# Patient Record
Sex: Female | Born: 1950
Health system: Southern US, Community
[De-identification: ages and names within clinical notes are randomized; demographics above are authoritative.]

## PROBLEM LIST (undated history)

## (undated) DIAGNOSIS — C4490 Unspecified malignant neoplasm of skin, unspecified: Secondary | ICD-10-CM

## (undated) DIAGNOSIS — I2510 Atherosclerotic heart disease of native coronary artery without angina pectoris: Secondary | ICD-10-CM

## (undated) DIAGNOSIS — M419 Scoliosis, unspecified: Secondary | ICD-10-CM

## (undated) DIAGNOSIS — R112 Nausea with vomiting, unspecified: Secondary | ICD-10-CM

## (undated) DIAGNOSIS — K219 Gastro-esophageal reflux disease without esophagitis: Secondary | ICD-10-CM

## (undated) DIAGNOSIS — Z9889 Other specified postprocedural states: Secondary | ICD-10-CM

## (undated) DIAGNOSIS — K224 Dyskinesia of esophagus: Secondary | ICD-10-CM

## (undated) DIAGNOSIS — C50919 Malignant neoplasm of unspecified site of unspecified female breast: Secondary | ICD-10-CM

## (undated) DIAGNOSIS — I10 Essential (primary) hypertension: Secondary | ICD-10-CM

## (undated) DIAGNOSIS — I429 Cardiomyopathy, unspecified: Secondary | ICD-10-CM

## (undated) DIAGNOSIS — K259 Gastric ulcer, unspecified as acute or chronic, without hemorrhage or perforation: Secondary | ICD-10-CM

## (undated) DIAGNOSIS — I509 Heart failure, unspecified: Secondary | ICD-10-CM

## (undated) DIAGNOSIS — E785 Hyperlipidemia, unspecified: Secondary | ICD-10-CM

## (undated) DIAGNOSIS — C801 Malignant (primary) neoplasm, unspecified: Secondary | ICD-10-CM

## (undated) DIAGNOSIS — F419 Anxiety disorder, unspecified: Secondary | ICD-10-CM

## (undated) DIAGNOSIS — M1990 Unspecified osteoarthritis, unspecified site: Secondary | ICD-10-CM

## (undated) DIAGNOSIS — T4145XA Adverse effect of unspecified anesthetic, initial encounter: Secondary | ICD-10-CM

## (undated) DIAGNOSIS — K449 Diaphragmatic hernia without obstruction or gangrene: Secondary | ICD-10-CM

## (undated) DIAGNOSIS — R350 Frequency of micturition: Secondary | ICD-10-CM

## (undated) HISTORY — DX: Dyskinesia of esophagus: K22.4

## (undated) HISTORY — DX: Unspecified malignant neoplasm of skin, unspecified: C44.90

## (undated) HISTORY — PX: RHINOPLASTY: SUR1284

## (undated) HISTORY — DX: Hyperlipidemia, unspecified: E78.5

## (undated) HISTORY — DX: Atherosclerotic heart disease of native coronary artery without angina pectoris: I25.10

## (undated) HISTORY — PX: CERVICAL FUSION: SHX112

## (undated) HISTORY — DX: Gastric ulcer, unspecified as acute or chronic, without hemorrhage or perforation: K25.9

## (undated) HISTORY — DX: Diaphragmatic hernia without obstruction or gangrene: K44.9

## (undated) HISTORY — DX: Cardiomyopathy, unspecified: I42.9

## (undated) HISTORY — DX: Essential (primary) hypertension: I10

## (undated) HISTORY — DX: Malignant neoplasm of unspecified site of unspecified female breast: C50.919

## (undated) SURGERY — ESOPHAGOGASTRODUODENOSCOPY (EGD) WITH PROPOFOL
Anesthesia: Monitor Anesthesia Care

---
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
## 1992-06-19 HISTORY — PX: CHOLECYSTECTOMY: SHX55

## 1997-06-19 LAB — HM COLONOSCOPY: HM Colonoscopy: NORMAL

## 1998-06-19 HISTORY — PX: ABDOMINAL HYSTERECTOMY: SHX81

## 2002-11-10 ENCOUNTER — Other Ambulatory Visit: Admission: RE | Admit: 2002-11-10 | Discharge: 2002-11-10 | Payer: Self-pay | Admitting: Family Medicine

## 2003-06-20 HISTORY — PX: KNEE ARTHROSCOPY: SHX127

## 2004-12-16 ENCOUNTER — Emergency Department (HOSPITAL_COMMUNITY): Admission: EM | Admit: 2004-12-16 | Discharge: 2004-12-16 | Payer: Self-pay | Admitting: Emergency Medicine

## 2005-03-07 ENCOUNTER — Encounter: Admission: RE | Admit: 2005-03-07 | Discharge: 2005-03-07 | Payer: Self-pay | Admitting: Orthopedic Surgery

## 2008-03-20 ENCOUNTER — Ambulatory Visit (HOSPITAL_BASED_OUTPATIENT_CLINIC_OR_DEPARTMENT_OTHER): Admission: RE | Admit: 2008-03-20 | Discharge: 2008-03-20 | Payer: Self-pay | Admitting: Family Medicine

## 2008-06-19 LAB — HM DIABETES EYE EXAM: HM Diabetic Eye Exam: NORMAL

## 2008-06-19 LAB — HM MAMMOGRAPHY: HM Mammogram: NORMAL

## 2009-03-16 ENCOUNTER — Ambulatory Visit: Payer: Self-pay | Admitting: Diagnostic Radiology

## 2009-03-16 ENCOUNTER — Emergency Department (HOSPITAL_BASED_OUTPATIENT_CLINIC_OR_DEPARTMENT_OTHER): Admission: EM | Admit: 2009-03-16 | Discharge: 2009-03-16 | Payer: Self-pay | Admitting: Emergency Medicine

## 2010-01-17 HISTORY — PX: FOOT NEUROMA SURGERY: SHX646

## 2010-01-26 LAB — HM PAP SMEAR: HM Pap smear: NORMAL

## 2010-09-23 LAB — DIFFERENTIAL
Basophils Absolute: 0 10*3/uL (ref 0.0–0.1)
Basophils Relative: 1 % (ref 0–1)
Eosinophils Absolute: 0.4 10*3/uL (ref 0.0–0.7)
Eosinophils Relative: 6 % — ABNORMAL HIGH (ref 0–5)
Lymphocytes Relative: 26 % (ref 12–46)
Lymphs Abs: 1.9 10*3/uL (ref 0.7–4.0)
Monocytes Absolute: 0.5 10*3/uL (ref 0.1–1.0)
Monocytes Relative: 7 % (ref 3–12)
Neutro Abs: 4.3 10*3/uL (ref 1.7–7.7)
Neutrophils Relative %: 60 % (ref 43–77)

## 2010-09-23 LAB — URINALYSIS, ROUTINE W REFLEX MICROSCOPIC
Bilirubin Urine: NEGATIVE
Glucose, UA: NEGATIVE mg/dL
Hgb urine dipstick: NEGATIVE
Ketones, ur: NEGATIVE mg/dL
Nitrite: NEGATIVE
Protein, ur: NEGATIVE mg/dL
Specific Gravity, Urine: 1.013 (ref 1.005–1.030)
Urobilinogen, UA: 0.2 mg/dL (ref 0.0–1.0)
pH: 6 (ref 5.0–8.0)

## 2010-09-23 LAB — BASIC METABOLIC PANEL
BUN: 19 mg/dL (ref 6–23)
CO2: 28 mEq/L (ref 19–32)
Calcium: 9.9 mg/dL (ref 8.4–10.5)
Chloride: 98 mEq/L (ref 96–112)
Creatinine, Ser: 0.6 mg/dL (ref 0.4–1.2)
GFR calc Af Amer: >60 mL/min (ref >60)
GFR calc non Af Amer: >60 mL/min (ref >60)
Glucose, Bld: 129 mg/dL — ABNORMAL HIGH (ref 70–99)
Potassium: 3.5 mEq/L (ref 3.5–5.1)
Sodium: 134 mEq/L — ABNORMAL LOW (ref 135–145)

## 2010-09-23 LAB — CBC
HCT: 36.3 % (ref 36.0–46.0)
Hemoglobin: 13.1 g/dL (ref 12.0–15.0)
MCHC: 36 g/dL (ref 30.0–36.0)
MCV: 92.2 fL (ref 78.0–100.0)
Platelets: 199 10*3/uL (ref 150–400)
RBC: 3.94 MIL/uL (ref 3.87–5.11)
RDW: 11.8 % (ref 11.5–15.5)
WBC: 7.1 10*3/uL (ref 4.0–10.5)

## 2010-09-26 ENCOUNTER — Encounter: Payer: Self-pay | Admitting: Nurse Practitioner

## 2010-09-26 DIAGNOSIS — E049 Nontoxic goiter, unspecified: Secondary | ICD-10-CM | POA: Insufficient documentation

## 2010-09-26 DIAGNOSIS — N6009 Solitary cyst of unspecified breast: Secondary | ICD-10-CM

## 2010-09-26 DIAGNOSIS — E785 Hyperlipidemia, unspecified: Secondary | ICD-10-CM | POA: Insufficient documentation

## 2010-09-26 DIAGNOSIS — I10 Essential (primary) hypertension: Secondary | ICD-10-CM

## 2010-09-26 DIAGNOSIS — K219 Gastro-esophageal reflux disease without esophagitis: Secondary | ICD-10-CM

## 2010-09-26 DIAGNOSIS — F419 Anxiety disorder, unspecified: Secondary | ICD-10-CM | POA: Insufficient documentation

## 2010-09-26 DIAGNOSIS — J309 Allergic rhinitis, unspecified: Secondary | ICD-10-CM | POA: Insufficient documentation

## 2010-09-26 DIAGNOSIS — IMO0002 Reserved for concepts with insufficient information to code with codable children: Secondary | ICD-10-CM | POA: Insufficient documentation

## 2010-11-04 ENCOUNTER — Ambulatory Visit: Payer: Self-pay | Admitting: Family

## 2010-12-06 ENCOUNTER — Ambulatory Visit (INDEPENDENT_AMBULATORY_CARE_PROVIDER_SITE_OTHER): Payer: Managed Care, Other (non HMO) | Admitting: Family

## 2010-12-06 ENCOUNTER — Encounter: Payer: Self-pay | Admitting: Family

## 2010-12-06 DIAGNOSIS — F17200 Nicotine dependence, unspecified, uncomplicated: Secondary | ICD-10-CM

## 2010-12-06 DIAGNOSIS — Z720 Tobacco use: Secondary | ICD-10-CM | POA: Insufficient documentation

## 2010-12-06 DIAGNOSIS — F419 Anxiety disorder, unspecified: Secondary | ICD-10-CM

## 2010-12-06 DIAGNOSIS — E785 Hyperlipidemia, unspecified: Secondary | ICD-10-CM

## 2010-12-06 DIAGNOSIS — I10 Essential (primary) hypertension: Secondary | ICD-10-CM | POA: Insufficient documentation

## 2010-12-06 DIAGNOSIS — F411 Generalized anxiety disorder: Secondary | ICD-10-CM

## 2010-12-06 MED ORDER — OLMESARTAN MEDOXOMIL-HCTZ 20-12.5 MG PO TABS: 1.0000 | ORAL_TABLET | Freq: Every day | ORAL | Status: DC

## 2010-12-06 NOTE — Assessment & Plan Note (Signed)
Discussed a low fat, low cholesterol diet.

## 2010-12-06 NOTE — Assessment & Plan Note (Signed)
Reports that this is stable.

## 2010-12-06 NOTE — Progress Notes (Signed)
Subjective:    Patient ID: Sherry Bender, female    DOB: 1951-01-09, 60 y.o.   MRN: [id number]  HPI  [facility]-   Hyperlipidemia-  She reports that she eats lots of fruits/veggies.  Eats "a lot of red meat."   HTN-  Reports that she was diagnosed a year ago.  Tries to follow a low sodium diet.    Patient presents today for followup of hypertension.  Patient has been treated for Chronic HTN for quiet sometime. She is currently on HTCZ, and well controlled. No associated S/S related to HTN.   Quality: chronic Modifying factor: meds Duration: Quite sometime Associated S/S: None.  The patient denies the following associated symptoms: Chest pain, dyspnea, blurred vision.  Notes occasional HA's and slight LE edema R>L- feet become more swollen.  Tobacco abuse- has tried nicotine patch, afraid of chantix. Motivated to quit - has set goal to quit by august.       Review of Systems  Constitutional: Negative for fever.  HENT: Positive for rhinorrhea. Negative for hearing loss.   Eyes: Negative for visual disturbance.  Respiratory: Positive for cough.   Cardiovascular: Negative for chest pain.  Gastrointestinal: Negative for nausea, vomiting and diarrhea.  Genitourinary: Negative for dysuria, frequency and hematuria.  Neurological: Negative for weakness and numbness.  Psychiatric/Behavioral:       Occasional anxiety- once in a while.     Past Medical History  Diagnosis Date  . Hyperlipidemia   . Hypertension     History   Social History  . Marital Status: Married    Spouse Name: N/A    Number of Children: N/A  . Years of Education: N/A   Occupational History  . Not on file.   Social History Main Topics  . Smoking status: Current Everyday Smoker -- 0.5 packs/day for 42 years    Types: Cigarettes  . Smokeless tobacco: Not on file  . Alcohol Use: No  . Drug Use: No  . Sexually Active: Not on file   Other Topics Concern  . 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  Two daughters born 37 and 87- one in [place] one in [place].     Past Surgical History  Procedure Date  . Cholecystectomy 1994  . Abdominal hysterectomy 2000    TAH/BSO  . Knee arthroscopy 2005    right knee  . Foot neuroma surgery 01/2010    also had metatarasl shortened    Family History  Problem Relation Age of Onset  . Heart disease Mother   . Hypertension Mother   . Diabetes Mother   . Heart disease Father   . Hypertension Father   . Cancer Paternal Aunt     ? breast  . Cancer Maternal Grandmother     ? ovarian cancer    Allergies  Allergen Reactions  . Codeine Nausea And Vomiting  . Lisinopril     Neck pain, muscle pain.  . Norvasc (Amlodipine Besylate)     Muscle pain  . Toprol Xl (Metoprolol Succinate)     Muscle pain    Current Outpatient Prescriptions on File Prior to Visit  Medication Sig Dispense Refill  . esomeprazole (NEXIUM) 40 MG capsule Take 40 mg by mouth daily before breakfast.        . estradiol (ESTRACE) 1 MG  tablet Take 1 mg by mouth daily.        . hydrochlorothiazide 25 MG tablet Take 25 mg by mouth daily.          BP 150/80  Pulse 78  Temp(Src) 97.8 F (36.6 C) (Oral)  Resp 16  Ht 5\' 7"  (1.702 m)  Wt 155 lb 0.6 oz (70.326 kg)  BMI 24.28 kg/m2  LMP 02/28/1999        Objective:   Physical Exam  Constitutional: She is oriented to person, place, and time. She appears well-developed and well-nourished.  HENT:  Head: Normocephalic and atraumatic.  Right Ear: External ear normal.  Left Ear: External ear normal.  Eyes: Conjunctivae are normal. Pupils are equal, round, and reactive to light.  Neck: Normal range of motion. Neck supple. No tracheal deviation present. No thyromegaly present.  Cardiovascular: Normal rate and regular rhythm.   Pulmonary/Chest: Effort  normal and breath sounds normal.  Abdominal: Soft.  Musculoskeletal: Normal range of motion.  Neurological: She is alert and oriented to person, place, and time.  Psychiatric:       Became tearful when she spoke about her current unemployment          Assessment & Plan:  Benicar HCT lot [id number] exp 07/14  Will request old records from her most recent provider.

## 2010-12-06 NOTE — Patient Instructions (Signed)
Please work hard on quitting smoking.  Follow up in 1 month.

## 2010-12-06 NOTE — Assessment & Plan Note (Signed)
BP not at goal.  Will switch to benicar HCT, plan to have pt f/u in 1 months. Check BMET in 1 month at f/u.

## 2010-12-06 NOTE — Assessment & Plan Note (Signed)
Patient was counseled on smoking cessation for 5 minutes.

## 2010-12-12 ENCOUNTER — Telehealth: Payer: Self-pay | Admitting: Family

## 2010-12-12 NOTE — Telephone Encounter (Signed)
Pt states that since she has started taking benicar she has been experiencing extreme fatique and leg cramps.

## 2010-12-13 MED ORDER — CLONIDINE HCL 0.1 MG PO TABS: 0.1000 mg | ORAL_TABLET | Freq: Two times a day (BID) | ORAL | Status: DC

## 2010-12-13 NOTE — Telephone Encounter (Signed)
Spoke with pt, she reports that after taking Benicar HCT she experiences extreme fatigue. Doesn't feel like she can get up and move around for some time after taking it. Also has leg cramps. States that she was not having these symptoms before she started the medication. Pt is also asking that we send a Rx to pharmacy for a blood pressure monitor so her insurance will cover it. Please advise.

## 2010-12-13 NOTE — Telephone Encounter (Signed)
Spoke with patient, instructed her to stop benicar HCT, restart HCTZ, add clonidine bid and follow up in 1 month.

## 2010-12-13 NOTE — Telephone Encounter (Signed)
Left message on home phone to return my call.

## 2010-12-15 ENCOUNTER — Telehealth: Payer: Self-pay | Admitting: *Deleted

## 2010-12-15 NOTE — Telephone Encounter (Signed)
Received records from Dr Candie Echevaria, DO and forwarded to Provider for review.

## 2010-12-19 ENCOUNTER — Telehealth: Payer: Self-pay | Admitting: *Deleted

## 2010-12-19 MED ORDER — CLONIDINE HCL 0.1 MG PO TABS: ORAL_TABLET | ORAL | Status: DC

## 2010-12-19 NOTE — Telephone Encounter (Signed)
Patient states she has been taking the Clonidine for the past week, she filled the Rx on 12/13/2010. Patient states her symptoms started the same day that she started the medication. She states she has continued with the medication, so that it could get in her system. She states she now has headaches, tiredness,shoulder blade pain, and feels like she is drunk all the time. Patient was advised to discontinue the medication until further instruction from [place]. Patient has verbalized understanding and agrees as instructed; will wait for a call back.

## 2010-12-19 NOTE — Telephone Encounter (Signed)
Call placed to patient at [phone], patient informed of follow up per Sandford Craze instructions. She has scheduled for 01/13/2011 @ 3:15 pm

## 2010-12-19 NOTE — Telephone Encounter (Signed)
Called patient, she reports that the clonidine is making her feel drowsy and off balance.  I recommended that she cut the clonidine in half and take 1/2 tablet twice daily.  Darlene, could you pls call pt and schedule a follow up appointment for  7/27 with me. Thanks.

## 2010-12-22 ENCOUNTER — Telehealth: Payer: Self-pay | Admitting: Family

## 2010-12-22 NOTE — Telephone Encounter (Signed)
Pt states that she has been taking 1/2 a clonidine and her symptoms are the same as taking a whole pill. See previous phone note.

## 2010-12-23 NOTE — Telephone Encounter (Signed)
Call placed to patient at [phone], no answer. A detailed voice message was left advising patient to stop taking the Clonidine, and call back to schedule follow up appointment with Melissa.

## 2010-12-28 ENCOUNTER — Ambulatory Visit (INDEPENDENT_AMBULATORY_CARE_PROVIDER_SITE_OTHER): Payer: Managed Care, Other (non HMO) | Admitting: Family

## 2010-12-28 ENCOUNTER — Encounter: Payer: Self-pay | Admitting: Family

## 2010-12-28 VITALS — BP 140/80 | HR 78 | Temp 98.1°F | Resp 16 | Ht 67.0 in | Wt 157.0 lb

## 2010-12-28 DIAGNOSIS — K219 Gastro-esophageal reflux disease without esophagitis: Secondary | ICD-10-CM

## 2010-12-28 DIAGNOSIS — I10 Essential (primary) hypertension: Secondary | ICD-10-CM

## 2010-12-28 MED ORDER — ESOMEPRAZOLE MAGNESIUM 40 MG PO CPDR: 40.0000 mg | DELAYED_RELEASE_CAPSULE | Freq: Every day | ORAL | Status: DC

## 2010-12-28 MED ORDER — HYDROCHLOROTHIAZIDE 25 MG PO TABS: 25.0000 mg | ORAL_TABLET | Freq: Every day | ORAL | Status: DC

## 2010-12-28 NOTE — Assessment & Plan Note (Signed)
BP is improved today on HCTZ only. She is very intolerant to medication overall.  Will continue on HCTZ only and have her return in 3 months.  She declines blood work today [employer]) as "I just had blood work done."  Will request these records from Dr. Christell Constant.

## 2010-12-28 NOTE — Patient Instructions (Addendum)
Please follow up in 3 months.  

## 2010-12-28 NOTE — Progress Notes (Signed)
Subjective:    Patient ID: Sherry Bender, female    DOB: [birth year], 60 y.o.   MRN: [id number]  HPI Patient presents today for followup of hypertension.  Patient has been treated for Chronic HTN for quiet sometime. She is currently on hydrochlorothiazide, and well controlled. No associated S/S related to HTN.   Quality: chronic Modifying factor: meds Duration: Quite sometime Associated S/S: None.  The patient denies the following associated symptoms: Chest pain, dyspnea, blurred vision, headache, or lower extremity edema.     Review of Systems See HPI  Past Medical History  Diagnosis Date  . Hyperlipidemia   . Hypertension     History   Social History  . Marital Status: Married    Spouse Name: N/A    Number of Children: N/A  . Years of Education: N/A   Occupational History  . Not on file.   Social History Main Topics  . Smoking status: Current Everyday Smoker -- 0.5 packs/day for 42 years    Types: Cigarettes  . Smokeless tobacco: Not on file  . Alcohol Use: No  . Drug Use: No  . Sexually Active: Not on file   Other Topics Concern  . 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  Two daughters born 88 and 40- one in [place] one in [place].     Past Surgical History  Procedure Date  . Cholecystectomy 1994  . Abdominal hysterectomy 2000    TAH/BSO  . Knee arthroscopy 2005    right knee  . Foot neuroma surgery 01/2010    also had metatarasl shortened    Family History  Problem Relation Age of Onset  . Heart disease Mother   . Hypertension Mother   . Diabetes Mother   . Heart disease Father   . Hypertension Father   . Cancer Paternal Aunt     ? breast  . Cancer Maternal Grandmother     ? ovarian cancer    Allergies  Allergen Reactions  . Benicar (Olmesartan Medoxomil)     Fatigue   . Clonidine Derivatives  Other (See Comments)    Drowsiness, pain in arms.  . Codeine Nausea And Vomiting  . Lisinopril     Neck pain, muscle pain.  . Norvasc (Amlodipine Besylate)     Muscle pain  . Toprol Xl (Metoprolol Succinate)     Muscle pain    Current Outpatient Prescriptions on File Prior to Visit  Medication Sig Dispense Refill  . esomeprazole (NEXIUM) 40 MG capsule Take 40 mg by mouth daily before breakfast.        . estradiol (ESTRACE) 1 MG tablet Take 1 mg by mouth daily.        . hydrochlorothiazide 25 MG tablet Take 25 mg by mouth daily.          BP 140/80  Pulse 78  Temp(Src) 98.1 F (36.7 C) (Oral)  Resp 16  Ht 5\' 7"  (1.702 m)  Wt 157 lb (71.215 kg)  BMI 24.59 kg/m2  LMP 02/28/1999       Objective:   Physical Exam  Constitutional: She appears well-developed and well-nourished.  HENT:  Head: Normocephalic and atraumatic.  Cardiovascular: Normal rate and regular rhythm.   Pulmonary/Chest: Effort normal and breath sounds normal.  Musculoskeletal: She exhibits no edema.  Psychiatric: She has a normal mood and affect. Her behavior  is normal. Judgment and thought content normal.          Assessment & Plan:   BP Readings from Last 3 Encounters:  12/28/10 140/80  12/06/10 150/80

## 2010-12-28 NOTE — Assessment & Plan Note (Signed)
Pt stopped the clonidine due to side effect, only taking the HCTZ.  She is watching her sodium intake.  She is walking.

## 2011-01-13 ENCOUNTER — Ambulatory Visit: Payer: Managed Care, Other (non HMO) | Admitting: Family

## 2011-02-08 ENCOUNTER — Telehealth: Payer: Self-pay | Admitting: *Deleted

## 2011-02-08 NOTE — Telephone Encounter (Signed)
Records received from [facility] family practice and forwarded to provider for review.

## 2011-03-13 ENCOUNTER — Telehealth: Payer: Self-pay | Admitting: Family

## 2011-03-13 NOTE — Telephone Encounter (Signed)
For long term use I would recommend tylenol PRN.  Celebrex and other NSAIDS when used regularly can lead to ulcers and kidney problems.

## 2011-03-13 NOTE — Telephone Encounter (Signed)
Pt notified. Denial left on [employer].

## 2011-03-13 NOTE — Telephone Encounter (Signed)
Refill- celebrex 200mg  cap. Take one capsule by mouth every day. Qty 30. Last fill 8.14.11

## 2011-03-13 NOTE — Telephone Encounter (Signed)
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 States that she also has arthritis. Please advise re: refill.

## 2011-03-27 ENCOUNTER — Other Ambulatory Visit: Payer: Self-pay | Admitting: Family

## 2011-03-27 DIAGNOSIS — Z1231 Encounter for screening mammogram for malignant neoplasm of breast: Secondary | ICD-10-CM

## 2011-04-14 ENCOUNTER — Ambulatory Visit (HOSPITAL_COMMUNITY)
Admission: RE | Admit: 2011-04-14 | Discharge: 2011-04-14 | Disposition: A | Discharge status: Home / Self Care | Payer: Managed Care, Other (non HMO) | Source: Ambulatory Visit | Attending: Family | Admitting: Family

## 2011-04-14 DIAGNOSIS — Z1231 Encounter for screening mammogram for malignant neoplasm of breast: Secondary | ICD-10-CM

## 2011-06-15 ENCOUNTER — Telehealth: Payer: Self-pay | Admitting: Family

## 2011-06-15 MED ORDER — HYDROCHLOROTHIAZIDE 25 MG PO TABS: 25.0000 mg | ORAL_TABLET | Freq: Every day | ORAL | Status: DC

## 2011-06-15 NOTE — Telephone Encounter (Signed)
Received fax refill request for HCTZ 25mg . Pt was due for follow up in October. Refill sent to pharmacy for #90 x no refills. Please call pt to arrange appointment as further refills cannot be given until she is seen in the office.

## 2011-06-15 NOTE — Telephone Encounter (Signed)
Informed patient that a 90 day supply has been sent to pharmacy but she needs to make an appt before any additional refills can be given. Patient states that she will have to call back to make appt.

## 2011-06-15 NOTE — Telephone Encounter (Signed)
Refill- hctz 25mg . 1 po qd #90.   Insurance requires 90 day supply

## 2012-01-25 ENCOUNTER — Telehealth: Payer: Self-pay | Admitting: Family

## 2012-01-25 NOTE — Telephone Encounter (Signed)
Refill- nexium dr 40mg  capsule. Take one capsule by mouth daily before breakfast. Qty 30 last fill 7.8.13

## 2012-01-26 NOTE — Telephone Encounter (Signed)
Noted, thanks!

## 2012-01-26 NOTE — Telephone Encounter (Signed)
Current request is being denied as pt is past due for f/u. Notified Weston Brass at [facility].  Pt last seen 12/28/10 and was recommended f/u in 03/2011. Please call pt to arrange f/u if she is still seeing [place] for primary care.

## 2012-01-26 NOTE — Telephone Encounter (Signed)
Routing to Tricia.

## 2012-01-26 NOTE — Telephone Encounter (Signed)
Patient states that rx should have gone to another Provider. She is seeing another physician for primary care.

## 2013-04-08 ENCOUNTER — Other Ambulatory Visit: Payer: Self-pay

## 2013-04-08 MED ORDER — SOLIFENACIN SUCCINATE 5 MG PO TABS: 10.0000 mg | ORAL_TABLET | Freq: Every day | ORAL | Status: DC

## 2013-04-08 NOTE — Telephone Encounter (Signed)
Last seen 08/16/12  [facility]   [facility] not in chart or on EPIC list

## 2013-09-23 ENCOUNTER — Telehealth: Payer: Self-pay | Admitting: Nurse Practitioner

## 2013-09-23 NOTE — Telephone Encounter (Signed)
Patient aware we have no available appts tomorrow

## 2013-11-17 ENCOUNTER — Encounter: Payer: Self-pay | Admitting: Family Medicine

## 2013-11-17 ENCOUNTER — Ambulatory Visit (INDEPENDENT_AMBULATORY_CARE_PROVIDER_SITE_OTHER): Payer: Managed Care, Other (non HMO) | Admitting: Family Medicine

## 2013-11-17 ENCOUNTER — Ambulatory Visit (INDEPENDENT_AMBULATORY_CARE_PROVIDER_SITE_OTHER): Payer: Managed Care, Other (non HMO)

## 2013-11-17 ENCOUNTER — Encounter (INDEPENDENT_AMBULATORY_CARE_PROVIDER_SITE_OTHER): Payer: Self-pay

## 2013-11-17 VITALS — BP 124/78 | HR 82 | Temp 98.4°F | Ht 67.0 in | Wt 134.6 lb

## 2013-11-17 DIAGNOSIS — R0781 Pleurodynia: Secondary | ICD-10-CM

## 2013-11-17 DIAGNOSIS — R079 Chest pain, unspecified: Secondary | ICD-10-CM

## 2013-11-17 DIAGNOSIS — S2239XA Fracture of one rib, unspecified side, initial encounter for closed fracture: Secondary | ICD-10-CM

## 2013-11-17 MED ORDER — TRAMADOL HCL 50 MG PO TABS: 50.0000 mg | ORAL_TABLET | Freq: Three times a day (TID) | ORAL | Status: DC | PRN

## 2013-11-17 MED ORDER — KETOROLAC TROMETHAMINE 30 MG/ML IJ SOLN
30.0000 mg | Freq: Once | INTRAMUSCULAR | Status: AC
  Administered 2013-11-17: 30 mg via INTRAMUSCULAR

## 2013-11-17 NOTE — Progress Notes (Signed)
   Subjective:    Patient ID: Sherry Bender, female    DOB: 11-16-1950, 63 y.o.   MRN: [id number]  HPI  This 63 y.o. female presents for evaluation of right rib pain due to fall on her deck on memorial day A week ago.  She has noticed persistent pain when she breathes.  She is having moderate to severe pain.  Review of Systems C/o right chest wall pain   No SOB, HA, dizziness, vision change, N/V, diarrhea, constipation, dysuria, urinary urgency or frequency, myalgias, arthralgias or rash.  Objective:   Physical Exam  Vital signs noted  Well developed well nourished female.  HEENT - Head atraumatic Normocephalic Respiratory - Lungs CTA bilateral Cardiac - RRR S1 and S2 without murmur MS - Eccymosis right knee, right shoulder, and TTP right lasteral intercostals w/o step off of deformity.  Xray right rib series - Fractured rib Prelimnary reading by Iverson Alamin    Assessment & Plan:  Rib pain on right side - Plan: DG Ribs Unilateral Right, traMADol (ULTRAM) 50 MG tablet  Rib fracture - Plan: traMADol (ULTRAM) 50 MG tablet one po tid prn pain #30 Incentive spirometer, toradol 30mg , Take motrin 800mg  one po tid x 7 days from Home.  Follow up prn  Lysbeth Penner FNP

## 2013-12-08 ENCOUNTER — Ambulatory Visit: Payer: Managed Care, Other (non HMO) | Admitting: Family Medicine

## 2013-12-12 ENCOUNTER — Ambulatory Visit (INDEPENDENT_AMBULATORY_CARE_PROVIDER_SITE_OTHER): Payer: Managed Care, Other (non HMO)

## 2013-12-12 ENCOUNTER — Ambulatory Visit (INDEPENDENT_AMBULATORY_CARE_PROVIDER_SITE_OTHER): Payer: Managed Care, Other (non HMO) | Admitting: Family Medicine

## 2013-12-12 ENCOUNTER — Encounter: Payer: Self-pay | Admitting: Family Medicine

## 2013-12-12 VITALS — BP 145/70 | HR 83 | Temp 96.9°F | Ht 67.0 in | Wt 136.8 lb

## 2013-12-12 DIAGNOSIS — M545 Low back pain, unspecified: Secondary | ICD-10-CM

## 2013-12-12 MED ORDER — METHYLPREDNISOLONE (PAK) 4 MG PO TABS: ORAL_TABLET | ORAL | Status: DC

## 2013-12-12 NOTE — Progress Notes (Signed)
   Subjective:    Patient ID: Sherry Bender, female    DOB: 14-Nov-1950, 63 y.o.   MRN: [id number]  HPI  This 63 y.o. female presents for evaluation of back pain.  She fell in May and had broken some Ribs and she also had some back pain that is not getting better.  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  She c/o back pain r>L and radiates down to knee.    Review of Systems No chest pain, SOB, HA, dizziness, vision change, N/V, diarrhea, constipation, dysuria, urinary urgency or frequency, myalgias, arthralgias or rash.     Objective:   Physical Exam  Vital signs noted  Well developed well nourished female.  HEENT - Head atraumatic Normocephalic                Eyes - PERRLA, Conjuctiva - clear Sclera- Clear EOMI                Ears - EAC's Wnl TM's Wnl Gross Hearing WNL                Throat - oropharanx wnl Respiratory - Lungs CTA bilateral Cardiac - RRR S1 and S2 without murmur GI - Abdomen soft Nontender and bowel sounds active x 4 MS - TTP bilateral LS muscles   LS xray - DDD LS spine Prelimnary reading by Silex:  Right low back pain, with sciatica presence unspecified - Plan: DG Lumbar Spine 2-3 Views, methylPREDNIsolone (MEDROL DOSPACK) 4 MG tablet Ultram 50mg  po q 6 hours prn pain Follow up prn  Lysbeth Penner FNP

## 2013-12-22 ENCOUNTER — Telehealth: Payer: Self-pay | Admitting: Family Medicine

## 2013-12-23 ENCOUNTER — Other Ambulatory Visit: Payer: Self-pay | Admitting: Family Medicine

## 2013-12-23 DIAGNOSIS — R200 Anesthesia of skin: Principal | ICD-10-CM

## 2013-12-23 DIAGNOSIS — M549 Dorsalgia, unspecified: Secondary | ICD-10-CM

## 2013-12-23 NOTE — Telephone Encounter (Signed)
Mri order has been put in

## 2013-12-26 ENCOUNTER — Telehealth: Payer: Self-pay | Admitting: Family Medicine

## 2013-12-26 NOTE — Telephone Encounter (Signed)
Spoke with patient and explained the denial from [facility] and the suggestion from [employer] to see orthopedics. Pt. Agreed to this and appointment was made with Lajoyce Corners, PA at [facility] for 01/01/14 at 1:00pm. Pt aware of appointment date/time

## 2014-01-07 ENCOUNTER — Other Ambulatory Visit: Payer: Self-pay

## 2014-01-07 DIAGNOSIS — M5440 Lumbago with sciatica, unspecified side: Secondary | ICD-10-CM

## 2014-05-19 ENCOUNTER — Other Ambulatory Visit: Payer: Self-pay | Admitting: Neurological Surgery

## 2014-06-04 NOTE — Pre-Procedure Instructions (Signed)
Josie Burleigh  06/04/2014   Your procedure is scheduled on:  Thursday, December 31st  Report to [facility] Admitting at 0930 AM.  Call this number if you have problems the morning of surgery: [phone]   Remember:   Do not eat food or drink liquids after midnight.   Take these medicines the morning of surgery with A SIP OF WATER: tylenol if needed, dexilant   Do not wear jewelry, make-up or nail polish.  Do not wear lotions, powders, or perfumes. You may wear deodorant.  Do not shave 48 hours prior to surgery. Men may shave face and neck.  Do not bring valuables to the hospital.  [facility] is not responsible for any belongings or valuables.               Contacts, dentures or bridgework may not be worn into surgery.  Leave suitcase in the car. After surgery it may be brought to your room.  For patients admitted to the hospital, discharge time is determined by your  treatment team.               Patients discharged the day of surgery will not be allowed to drive home.  Please read over the following fact sheets that you were given: Pain Booklet, Coughing and Deep Breathing, MRSA Information and Surgical Site Infection Prevention  [facility] - Preparing for Surgery  Before surgery, you can play an important role.  Because skin is not sterile, your skin needs to be as free of germs as possible.  You can reduce the number of germs on you skin by washing with CHG (chlorahexidine gluconate) soap before surgery.  CHG is an antiseptic cleaner which kills germs and bonds with the skin to continue killing germs even after washing.  Please DO NOT use if you have an allergy to CHG or antibacterial soaps.  If your skin becomes reddened/irritated stop using the CHG and inform your nurse when you arrive at Short Stay.  Do not shave (including legs and underarms) for at least 48 hours prior to the first CHG shower.  You may shave your face.  Please follow these instructions  carefully:   1.  Shower with CHG Soap the night before surgery and the morning of Surgery.  2.  If you choose to wash your hair, wash your hair first as usual with your normal shampoo.  3.  After you shampoo, rinse your hair and body thoroughly to remove the shampoo.  4.  Use CHG as you would any other liquid soap.  You can apply CHG directly to the skin and wash gently with scrungie or a clean washcloth.  5.  Apply the CHG Soap to your body ONLY FROM THE NECK DOWN.  Do not use on open wounds or open sores.  Avoid contact with your eyes, ears, mouth and genitals (private parts).  Wash genitals (private parts) with your normal soap.  6.  Wash thoroughly, paying special attention to the area where your surgery will be performed.  7.  Thoroughly rinse your body with warm water from the neck down.  8.  DO NOT shower/wash with your normal soap after using and rinsing off the CHG Soap.  9.  Pat yourself dry with a clean towel.            10.  Wear clean pajamas.            11.  Place clean sheets on your bed the night  of your first shower and do not sleep with pets.  Day of Surgery  Do not apply any lotions/deoderants the morning of surgery.  Please wear clean clothes to the hospital/surgery center.

## 2014-06-05 ENCOUNTER — Encounter (HOSPITAL_COMMUNITY)
Admission: RE | Admit: 2014-06-05 | Discharge: 2014-06-05 | Disposition: A | Discharge status: Home / Self Care | Payer: Managed Care, Other (non HMO) | Source: Ambulatory Visit | Attending: Neurological Surgery | Admitting: Neurological Surgery

## 2014-06-05 ENCOUNTER — Encounter (HOSPITAL_COMMUNITY): Payer: Self-pay

## 2014-06-05 DIAGNOSIS — Z01818 Encounter for other preprocedural examination: Secondary | ICD-10-CM | POA: Diagnosis present

## 2014-06-05 DIAGNOSIS — I10 Essential (primary) hypertension: Secondary | ICD-10-CM | POA: Diagnosis not present

## 2014-06-05 HISTORY — DX: Unspecified osteoarthritis, unspecified site: M19.90

## 2014-06-05 HISTORY — DX: Other specified postprocedural states: R11.2

## 2014-06-05 HISTORY — DX: Malignant (primary) neoplasm, unspecified: C80.1

## 2014-06-05 HISTORY — DX: Other specified postprocedural states: Z98.890

## 2014-06-05 HISTORY — DX: Gastro-esophageal reflux disease without esophagitis: K21.9

## 2014-06-05 HISTORY — DX: Adverse effect of unspecified anesthetic, initial encounter: T41.45XA

## 2014-06-05 HISTORY — DX: Scoliosis, unspecified: M41.9

## 2014-06-05 LAB — CBC
HCT: 37.4 % (ref 36.0–46.0)
Hemoglobin: 12.7 g/dL (ref 12.0–15.0)
MCH: 30 pg (ref 26.0–34.0)
MCHC: 34 g/dL (ref 30.0–36.0)
MCV: 88.4 fL (ref 78.0–100.0)
Platelets: 215 10*3/uL (ref 150–400)
RBC: 4.23 MIL/uL (ref 3.87–5.11)
RDW: 12.5 % (ref 11.5–15.5)
WBC: 5.6 10*3/uL (ref 4.0–10.5)

## 2014-06-05 LAB — BASIC METABOLIC PANEL
Anion gap: 14 (ref 5–15)
BUN: 7 mg/dL (ref 6–23)
CO2: 27 mEq/L (ref 19–32)
Calcium: 9.5 mg/dL (ref 8.4–10.5)
Chloride: 91 mEq/L — ABNORMAL LOW (ref 96–112)
Creatinine, Ser: 0.31 mg/dL — ABNORMAL LOW (ref 0.50–1.10)
GFR calc Af Amer: >90 mL/min (ref >90)
GFR calc non Af Amer: >90 mL/min (ref >90)
Glucose, Bld: 84 mg/dL (ref 70–99)
Potassium: 3.2 mEq/L — ABNORMAL LOW (ref 3.7–5.3)
Sodium: 132 mEq/L — ABNORMAL LOW (ref 137–147)

## 2014-06-05 LAB — SURGICAL PCR SCREEN
MRSA, PCR: NEGATIVE
Staphylococcus aureus: NEGATIVE

## 2014-06-05 NOTE — Progress Notes (Signed)
Sees Dr Brigitte Pulse in [place], [place]                         Denies any cardiac problems.   DA

## 2014-06-17 MED ORDER — CEFAZOLIN SODIUM-DEXTROSE 2-3 GM-% IV SOLR
2.0000 g | INTRAVENOUS | Status: AC
  Administered 2014-06-18: 2 g via INTRAVENOUS
  Filled 2014-06-17: qty 50

## 2014-06-18 ENCOUNTER — Ambulatory Visit (HOSPITAL_COMMUNITY): Payer: Managed Care, Other (non HMO) | Admitting: Anesthesiology

## 2014-06-18 ENCOUNTER — Encounter (HOSPITAL_COMMUNITY)
Admission: RE | Disposition: A | Discharge status: Home / Self Care | Payer: Self-pay | Source: Ambulatory Visit | Attending: Neurological Surgery

## 2014-06-18 ENCOUNTER — Ambulatory Visit (HOSPITAL_COMMUNITY): Payer: Managed Care, Other (non HMO)

## 2014-06-18 ENCOUNTER — Encounter (HOSPITAL_COMMUNITY): Payer: Self-pay | Admitting: *Deleted

## 2014-06-18 ENCOUNTER — Ambulatory Visit (HOSPITAL_COMMUNITY)
Admission: RE | Admit: 2014-06-18 | Discharge: 2014-06-19 | Disposition: A | Discharge status: Home / Self Care | Payer: Managed Care, Other (non HMO) | Source: Ambulatory Visit | Attending: Neurological Surgery | Admitting: Neurological Surgery

## 2014-06-18 DIAGNOSIS — Z85828 Personal history of other malignant neoplasm of skin: Secondary | ICD-10-CM | POA: Diagnosis not present

## 2014-06-18 DIAGNOSIS — Z886 Allergy status to analgesic agent status: Secondary | ICD-10-CM | POA: Insufficient documentation

## 2014-06-18 DIAGNOSIS — Z79899 Other long term (current) drug therapy: Secondary | ICD-10-CM | POA: Diagnosis not present

## 2014-06-18 DIAGNOSIS — M4802 Spinal stenosis, cervical region: Secondary | ICD-10-CM | POA: Diagnosis not present

## 2014-06-18 DIAGNOSIS — E785 Hyperlipidemia, unspecified: Secondary | ICD-10-CM | POA: Diagnosis not present

## 2014-06-18 DIAGNOSIS — M419 Scoliosis, unspecified: Secondary | ICD-10-CM | POA: Diagnosis not present

## 2014-06-18 DIAGNOSIS — M542 Cervicalgia: Secondary | ICD-10-CM

## 2014-06-18 DIAGNOSIS — K219 Gastro-esophageal reflux disease without esophagitis: Secondary | ICD-10-CM | POA: Diagnosis not present

## 2014-06-18 DIAGNOSIS — I10 Essential (primary) hypertension: Secondary | ICD-10-CM | POA: Diagnosis not present

## 2014-06-18 DIAGNOSIS — F17210 Nicotine dependence, cigarettes, uncomplicated: Secondary | ICD-10-CM | POA: Insufficient documentation

## 2014-06-18 DIAGNOSIS — Z981 Arthrodesis status: Secondary | ICD-10-CM | POA: Diagnosis not present

## 2014-06-18 DIAGNOSIS — Z888 Allergy status to other drugs, medicaments and biological substances status: Secondary | ICD-10-CM | POA: Insufficient documentation

## 2014-06-18 DIAGNOSIS — M47812 Spondylosis without myelopathy or radiculopathy, cervical region: Secondary | ICD-10-CM | POA: Insufficient documentation

## 2014-06-18 HISTORY — PX: ANTERIOR CERVICAL DECOMP/DISCECTOMY FUSION: SHX1161

## 2014-06-18 SURGERY — ANTERIOR CERVICAL DECOMPRESSION/DISCECTOMY FUSION 3 LEVELS
Anesthesia: General | Site: Neck

## 2014-06-18 MED ORDER — PROMETHAZINE HCL 25 MG/ML IJ SOLN: 6.2500 mg | INTRAMUSCULAR | Status: DC | PRN

## 2014-06-18 MED ORDER — METHOCARBAMOL 500 MG PO TABS
ORAL_TABLET | ORAL | Status: AC
  Filled 2014-06-18: qty 1

## 2014-06-18 MED ORDER — ONDANSETRON HCL 4 MG/2ML IJ SOLN
INTRAMUSCULAR | Status: AC
  Filled 2014-06-18: qty 2

## 2014-06-18 MED ORDER — GLYCOPYRROLATE 0.2 MG/ML IJ SOLN
INTRAMUSCULAR | Status: DC | PRN
  Administered 2014-06-18: 0.4 mg via INTRAVENOUS

## 2014-06-18 MED ORDER — FENTANYL CITRATE 0.05 MG/ML IJ SOLN
INTRAMUSCULAR | Status: DC | PRN
  Administered 2014-06-18 (×3): 50 ug via INTRAVENOUS
  Administered 2014-06-18: 100 ug via INTRAVENOUS

## 2014-06-18 MED ORDER — SODIUM CHLORIDE 0.9 % IJ SOLN: 3.0000 mL | INTRAMUSCULAR | Status: DC | PRN

## 2014-06-18 MED ORDER — FENTANYL CITRATE 0.05 MG/ML IJ SOLN
INTRAMUSCULAR | Status: AC
  Filled 2014-06-18: qty 5

## 2014-06-18 MED ORDER — THROMBIN 5000 UNITS EX SOLR
CUTANEOUS | Status: DC | PRN
  Administered 2014-06-18 (×2): 10 mL via TOPICAL

## 2014-06-18 MED ORDER — METHOCARBAMOL 1000 MG/10ML IJ SOLN
500.0000 mg | Freq: Four times a day (QID) | INTRAVENOUS | Status: DC | PRN
  Filled 2014-06-18: qty 5

## 2014-06-18 MED ORDER — THROMBIN 20000 UNITS EX SOLR
CUTANEOUS | Status: DC | PRN
  Administered 2014-06-18: 20 mL via TOPICAL

## 2014-06-18 MED ORDER — DEXAMETHASONE SODIUM PHOSPHATE 10 MG/ML IJ SOLN
INTRAMUSCULAR | Status: AC
  Filled 2014-06-18: qty 1

## 2014-06-18 MED ORDER — MIDAZOLAM HCL 2 MG/2ML IJ SOLN
INTRAMUSCULAR | Status: AC
  Filled 2014-06-18: qty 2

## 2014-06-18 MED ORDER — ACETAMINOPHEN 325 MG PO TABS
650.0000 mg | ORAL_TABLET | ORAL | Status: DC | PRN
  Administered 2014-06-18 – 2014-06-19 (×4): 650 mg via ORAL
  Filled 2014-06-18 (×4): qty 2

## 2014-06-18 MED ORDER — LIDOCAINE HCL (CARDIAC) 20 MG/ML IV SOLN
INTRAVENOUS | Status: AC
  Filled 2014-06-18: qty 5

## 2014-06-18 MED ORDER — OXYCODONE HCL 5 MG PO TABS
ORAL_TABLET | ORAL | Status: AC
  Filled 2014-06-18: qty 1

## 2014-06-18 MED ORDER — ROCURONIUM BROMIDE 50 MG/5ML IV SOLN
INTRAVENOUS | Status: AC
  Filled 2014-06-18: qty 1

## 2014-06-18 MED ORDER — DARIFENACIN HYDROBROMIDE ER 7.5 MG PO TB24
7.5000 mg | ORAL_TABLET | Freq: Every day | ORAL | Status: DC
  Administered 2014-06-19: 7.5 mg via ORAL
  Filled 2014-06-18: qty 1

## 2014-06-18 MED ORDER — SODIUM CHLORIDE 0.9 % IR SOLN
Status: DC | PRN
  Administered 2014-06-18: 500 mL

## 2014-06-18 MED ORDER — HYDROCHLOROTHIAZIDE 25 MG PO TABS
25.0000 mg | ORAL_TABLET | Freq: Every day | ORAL | Status: DC
  Administered 2014-06-19: 25 mg via ORAL
  Filled 2014-06-18: qty 1

## 2014-06-18 MED ORDER — POTASSIUM CHLORIDE IN NACL 20-0.9 MEQ/L-% IV SOLN
INTRAVENOUS | Status: DC
  Administered 2014-06-18: via INTRAVENOUS
  Filled 2014-06-18 (×3): qty 1000

## 2014-06-18 MED ORDER — PHENYLEPHRINE HCL 10 MG/ML IJ SOLN
INTRAMUSCULAR | Status: DC | PRN
  Administered 2014-06-18: 80 ug via INTRAVENOUS
  Administered 2014-06-18: 40 ug via INTRAVENOUS
  Administered 2014-06-18: 80 ug via INTRAVENOUS
  Administered 2014-06-18: 40 ug via INTRAVENOUS
  Administered 2014-06-18: 120 ug via INTRAVENOUS
  Administered 2014-06-18: 80 ug via INTRAVENOUS
  Administered 2014-06-18: 40 ug via INTRAVENOUS
  Administered 2014-06-18 (×2): 80 ug via INTRAVENOUS

## 2014-06-18 MED ORDER — MIDAZOLAM HCL 5 MG/5ML IJ SOLN
INTRAMUSCULAR | Status: DC | PRN
  Administered 2014-06-18: 2 mg via INTRAVENOUS

## 2014-06-18 MED ORDER — PHENOL 1.4 % MT LIQD: 1.0000 | OROMUCOSAL | Status: DC | PRN

## 2014-06-18 MED ORDER — DEXAMETHASONE SODIUM PHOSPHATE 10 MG/ML IJ SOLN
INTRAMUSCULAR | Status: DC | PRN
  Administered 2014-06-18: 10 mg via INTRAVENOUS

## 2014-06-18 MED ORDER — FENTANYL CITRATE 0.05 MG/ML IJ SOLN
INTRAMUSCULAR | Status: AC
Start: 2014-06-18 — End: 2014-06-18
  Administered 2014-06-18: 50 ug via INTRAVENOUS
  Filled 2014-06-18: qty 2

## 2014-06-18 MED ORDER — ROCURONIUM BROMIDE 100 MG/10ML IV SOLN
INTRAVENOUS | Status: DC | PRN
  Administered 2014-06-18: 50 mg via INTRAVENOUS

## 2014-06-18 MED ORDER — DEXAMETHASONE SODIUM PHOSPHATE 4 MG/ML IJ SOLN
4.0000 mg | Freq: Four times a day (QID) | INTRAMUSCULAR | Status: DC
  Administered 2014-06-18 (×2): 4 mg via INTRAVENOUS
  Filled 2014-06-18 (×6): qty 1

## 2014-06-18 MED ORDER — OXYCODONE-ACETAMINOPHEN 5-325 MG PO TABS: 1.0000 | ORAL_TABLET | ORAL | Status: DC | PRN

## 2014-06-18 MED ORDER — CEFAZOLIN SODIUM 1-5 GM-% IV SOLN
1.0000 g | Freq: Three times a day (TID) | INTRAVENOUS | Status: AC
  Administered 2014-06-18 – 2014-06-19 (×2): 1 g via INTRAVENOUS
  Filled 2014-06-18 (×2): qty 50

## 2014-06-18 MED ORDER — PHENYLEPHRINE 40 MCG/ML (10ML) SYRINGE FOR IV PUSH (FOR BLOOD PRESSURE SUPPORT)
PREFILLED_SYRINGE | INTRAVENOUS | Status: AC
  Filled 2014-06-18: qty 10

## 2014-06-18 MED ORDER — NEOSTIGMINE METHYLSULFATE 10 MG/10ML IV SOLN
INTRAVENOUS | Status: AC
  Filled 2014-06-18: qty 1

## 2014-06-18 MED ORDER — DEXAMETHASONE 4 MG PO TABS
4.0000 mg | ORAL_TABLET | Freq: Four times a day (QID) | ORAL | Status: DC
  Administered 2014-06-19: 4 mg via ORAL
  Filled 2014-06-18 (×5): qty 1

## 2014-06-18 MED ORDER — MORPHINE SULFATE 2 MG/ML IJ SOLN
1.0000 mg | INTRAMUSCULAR | Status: DC | PRN
  Administered 2014-06-18 – 2014-06-19 (×2): 2 mg via INTRAVENOUS
  Filled 2014-06-18 (×2): qty 1

## 2014-06-18 MED ORDER — OXYCODONE HCL 5 MG/5ML PO SOLN: 5.0000 mg | Freq: Once | ORAL | Status: AC | PRN

## 2014-06-18 MED ORDER — PROPOFOL 10 MG/ML IV BOLUS
INTRAVENOUS | Status: AC
  Filled 2014-06-18: qty 20

## 2014-06-18 MED ORDER — GLYCOPYRROLATE 0.2 MG/ML IJ SOLN
INTRAMUSCULAR | Status: AC
  Filled 2014-06-18: qty 2

## 2014-06-18 MED ORDER — ONDANSETRON HCL 4 MG/2ML IJ SOLN
INTRAMUSCULAR | Status: DC | PRN
  Administered 2014-06-18: 4 mg via INTRAVENOUS

## 2014-06-18 MED ORDER — BUPIVACAINE HCL (PF) 0.25 % IJ SOLN
INTRAMUSCULAR | Status: DC | PRN
  Administered 2014-06-18: 4 mL

## 2014-06-18 MED ORDER — METHOCARBAMOL 500 MG PO TABS
500.0000 mg | ORAL_TABLET | Freq: Four times a day (QID) | ORAL | Status: DC | PRN
  Administered 2014-06-19: 500 mg via ORAL
  Filled 2014-06-18 (×2): qty 1

## 2014-06-18 MED ORDER — ACETAMINOPHEN 650 MG RE SUPP: 650.0000 mg | RECTAL | Status: DC | PRN

## 2014-06-18 MED ORDER — LIDOCAINE HCL (CARDIAC) 20 MG/ML IV SOLN
INTRAVENOUS | Status: DC | PRN
  Administered 2014-06-18: 40 mg via INTRAVENOUS

## 2014-06-18 MED ORDER — ONDANSETRON HCL 4 MG/2ML IJ SOLN
4.0000 mg | INTRAMUSCULAR | Status: DC | PRN
  Administered 2014-06-18: 4 mg via INTRAVENOUS
  Filled 2014-06-18: qty 2

## 2014-06-18 MED ORDER — OXYCODONE HCL 5 MG PO TABS
5.0000 mg | ORAL_TABLET | Freq: Once | ORAL | Status: AC | PRN
  Administered 2014-06-18: 5 mg via ORAL

## 2014-06-18 MED ORDER — PROPOFOL 10 MG/ML IV BOLUS
INTRAVENOUS | Status: DC | PRN
  Administered 2014-06-18: 120 mg via INTRAVENOUS

## 2014-06-18 MED ORDER — SODIUM CHLORIDE 0.9 % IJ SOLN: 3.0000 mL | Freq: Two times a day (BID) | INTRAMUSCULAR | Status: DC

## 2014-06-18 MED ORDER — MENTHOL 3 MG MT LOZG: 1.0000 | LOZENGE | OROMUCOSAL | Status: DC | PRN

## 2014-06-18 MED ORDER — NEOSTIGMINE METHYLSULFATE 10 MG/10ML IV SOLN
INTRAVENOUS | Status: DC | PRN
  Administered 2014-06-18: 3 mg via INTRAVENOUS

## 2014-06-18 MED ORDER — LACTATED RINGERS IV SOLN
INTRAVENOUS | Status: DC
  Administered 2014-06-18 (×2): via INTRAVENOUS

## 2014-06-18 MED ORDER — FENTANYL CITRATE 0.05 MG/ML IJ SOLN
25.0000 ug | INTRAMUSCULAR | Status: DC | PRN
  Administered 2014-06-18 (×2): 50 ug via INTRAVENOUS

## 2014-06-18 MED ORDER — 0.9 % SODIUM CHLORIDE (POUR BTL) OPTIME
TOPICAL | Status: DC | PRN
  Administered 2014-06-18: 1000 mL

## 2014-06-18 SURGICAL SUPPLY — 52 items
ALLOGRAFT LORDOTIC CC 7X11X14 (Bone Implant) ×2 IMPLANT
ALLOGRAFT TRIAD LORDOTIC CC (Bone Implant) ×4 IMPLANT
BAG DECANTER FOR FLEXI CONT (MISCELLANEOUS) ×2 IMPLANT
BENZOIN TINCTURE PRP APPL 2/3 (GAUZE/BANDAGES/DRESSINGS) ×2 IMPLANT
BIT DRILL POWER (BIT) ×1 IMPLANT
BUR MATCHSTICK NEURO 3.0 LAGG (BURR) ×2 IMPLANT
CANISTER SUCT 3000ML (MISCELLANEOUS) ×2 IMPLANT
CONT SPEC 4OZ CLIKSEAL STRL BL (MISCELLANEOUS) ×2 IMPLANT
DRAPE C-ARM 42X72 X-RAY (DRAPES) ×4 IMPLANT
DRAPE LAPAROTOMY 100X72 PEDS (DRAPES) ×2 IMPLANT
DRAPE MICROSCOPE LEICA (MISCELLANEOUS) ×2 IMPLANT
DRAPE POUCH INSTRU U-SHP 10X18 (DRAPES) ×2 IMPLANT
DRILL BIT POWER (BIT) ×1
DRSG OPSITE 4X5.5 SM (GAUZE/BANDAGES/DRESSINGS) ×2 IMPLANT
DRSG OPSITE POSTOP 4X6 (GAUZE/BANDAGES/DRESSINGS) ×2 IMPLANT
DRSG TELFA 3X8 NADH (GAUZE/BANDAGES/DRESSINGS) ×2 IMPLANT
DURAPREP 6ML APPLICATOR 50/CS (WOUND CARE) ×2 IMPLANT
ELECT COATED BLADE 2.86 ST (ELECTRODE) ×2 IMPLANT
ELECT REM PT RETURN 9FT ADLT (ELECTROSURGICAL) ×2
ELECTRODE REM PT RTRN 9FT ADLT (ELECTROSURGICAL) ×1 IMPLANT
GAUZE SPONGE 4X4 16PLY XRAY LF (GAUZE/BANDAGES/DRESSINGS) IMPLANT
GLOVE BIO SURGEON STRL SZ8 (GLOVE) ×2 IMPLANT
GLOVE BIOGEL PI IND STRL 7.0 (GLOVE) ×2 IMPLANT
GLOVE BIOGEL PI INDICATOR 7.0 (GLOVE) ×2
GLOVE SS N UNI LF 7.0 STRL (GLOVE) ×8 IMPLANT
GOWN STRL REUS W/ TWL LRG LVL3 (GOWN DISPOSABLE) IMPLANT
GOWN STRL REUS W/ TWL XL LVL3 (GOWN DISPOSABLE) ×3 IMPLANT
GOWN STRL REUS W/TWL 2XL LVL3 (GOWN DISPOSABLE) IMPLANT
GOWN STRL REUS W/TWL LRG LVL3 (GOWN DISPOSABLE)
GOWN STRL REUS W/TWL XL LVL3 (GOWN DISPOSABLE) ×3
HALTER HD/CHIN CERV TRACTION D (MISCELLANEOUS) IMPLANT
HEMOSTAT POWDER KIT SURGIFOAM (HEMOSTASIS) ×2 IMPLANT
KIT BASIN OR (CUSTOM PROCEDURE TRAY) ×2 IMPLANT
KIT ROOM TURNOVER OR (KITS) ×2 IMPLANT
NEEDLE HYPO 25X1 1.5 SAFETY (NEEDLE) ×2 IMPLANT
NEEDLE SPNL 20GX3.5 QUINCKE YW (NEEDLE) ×2 IMPLANT
NS IRRIG 1000ML POUR BTL (IV SOLUTION) ×2 IMPLANT
PACK LAMINECTOMY NEURO (CUSTOM PROCEDURE TRAY) ×2 IMPLANT
PAD ARMBOARD 7.5X6 YLW CONV (MISCELLANEOUS) ×2 IMPLANT
PLATE HELIX T 54MM (Plate) ×2 IMPLANT
RUBBERBAND STERILE (MISCELLANEOUS) ×4 IMPLANT
SCREW FIXED SELF TAP 4.0X13MM (Screw) ×16 IMPLANT
SPONGE INTESTINAL PEANUT (DISPOSABLE) ×2 IMPLANT
SPONGE SURGIFOAM ABS GEL 100 (HEMOSTASIS) ×2 IMPLANT
STRIP CLOSURE SKIN 1/2X4 (GAUZE/BANDAGES/DRESSINGS) ×2 IMPLANT
SUT VIC AB 3-0 SH 8-18 (SUTURE) ×2 IMPLANT
SYR 20ML ECCENTRIC (SYRINGE) IMPLANT
TAPE STRIPS DRAPE STRL (GAUZE/BANDAGES/DRESSINGS) ×2 IMPLANT
TOWEL OR 17X24 6PK STRL BLUE (TOWEL DISPOSABLE) ×2 IMPLANT
TOWEL OR 17X26 10 PK STRL BLUE (TOWEL DISPOSABLE) ×2 IMPLANT
TRAP SPECIMEN MUCOUS 40CC (MISCELLANEOUS) IMPLANT
WATER STERILE IRR 1000ML POUR (IV SOLUTION) ×2 IMPLANT

## 2014-06-18 NOTE — H&P (Signed)
Subjective:   Patient is a 63 y.o. female admitted for ACDF. The patient first presented to me with complaints of neck pain, shooting pains in the arm(s) and numbness of the arm(s). Onset of symptoms was several years ago. The pain is described as aching and occurs all day. The pain is rated severe, and is located  Base of neck and radiates to the LUE. The symptoms have been progressive. Symptoms are exacerbated by extending head backwards, and are relieved by none.  Previous work up includes MRI of cervical spine, results: spinal stenosis.  Past Medical History  Diagnosis Date  . Hyperlipidemia   . Hypertension   . Complication of anesthesia     "HARD TO WAKE UP".......Marland KitchenN/V  . PONV (postoperative nausea and vomiting)   . GERD (gastroesophageal reflux disease)   . Headache     FROM THE NECK  . Arthritis     IN BACK....  . Scoliosis   . 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 Cholecystectomy  1994  . Abdominal hysterectomy  2000    TAH/BSO  . Knee arthroscopy  2005    right knee  . Foot neuroma surgery  01/2010    also had metatarasl shortened  . Rhinoplasty      Allergies  Allergen Reactions  . Benicar [Olmesartan Medoxomil]     Fatigue   . Clonidine Derivatives Other (See Comments)    Drowsiness, pain in arms.  . Codeine Nausea And Vomiting  . Dilaudid [Hydromorphone] Other (See Comments)    Chest pressure, anxiety  . Lisinopril     Neck pain, muscle pain.  . Norvasc [Amlodipine Besylate]     Muscle pain  . Toprol Xl [Metoprolol Succinate]     Muscle pain    History  Substance Use Topics  . Smoking status: Current Every Day Smoker -- 0.50 packs/day for 42 years    Types: Cigarettes  . Smokeless tobacco: Not on file  . Alcohol Use: No    Family History  Problem Relation Age of Onset  . Heart disease Mother   . Hypertension Mother   . Diabetes Mother   . Heart disease Father   . Hypertension Father   . Cancer  Paternal Aunt     ? breast  . Cancer Maternal Grandmother     ? ovarian cancer   Prior to Admission medications   Medication Sig Start Date End Date Taking? Authorizing Provider  acetaminophen (TYLENOL) 325 MG tablet Take 650 mg by mouth daily as needed (pain).   Yes Historical Provider, MD  DEXILANT 60 MG capsule Take 60 mg by mouth daily. 04/05/14  Yes Historical Provider, MD  estradiol (ESTRACE) 1 MG tablet Take 1 mg by mouth daily.     Yes Historical Provider, MD  hydrochlorothiazide (HYDRODIURIL) 25 MG tablet Take 1 tablet (25 mg total) by mouth daily. 06/15/11  Yes Debbrah Alar, NP  Naproxen Sodium (ALEVE) 220 MG CAPS Take 220 mg by mouth 2 (two) times daily as needed (pain).   Yes Historical Provider, MD  Pumpkin Seed-Soy Germ (AZO BLADDER CONTROL/GO-LESS PO) Take 1 tablet by mouth 2 (two) times daily.   Yes Historical Provider, MD  methylPREDNIsolone (MEDROL DOSPACK) 4 MG tablet follow package directions Patient not taking: Reported on 06/04/2014 12/12/13   Lysbeth Penner, FNP  solifenacin (VESICARE) 5 MG tablet Take 2 tablets (10 mg total) by mouth daily. Patient not taking: Reported  on 06/04/2014 04/08/13   Mary-Margaret Hassell Done, FNP  traMADol (ULTRAM) 50 MG tablet Take 1 tablet (50 mg total) by mouth every 8 (eight) hours as needed. Patient not taking: Reported on 06/04/2014 11/17/13   Lysbeth Penner, FNP     Review of Systems  Positive ROS: neg  All other systems have been reviewed and were otherwise negative with the exception of those mentioned in the HPI and as above.  Objective: Vital signs in last 24 hours:    General Appearance: Alert, cooperative, no distress, appears stated age Head: Normocephalic, without obvious abnormality, atraumatic Eyes: PERRL, conjunctiva/corneas clear, EOM's intact      Neck: Supple, symmetrical, trachea midline, Back: Symmetric, no curvature, ROM normal, no CVA tenderness Lungs:  respirations unlabored Heart: Regular rate and  rhythm Abdomen: Soft, non-tender Extremities: Extremities normal, atraumatic, no cyanosis or edema Pulses: 2+ and symmetric all extremities Skin: Skin color, texture, turgor normal, no rashes or lesions  NEUROLOGIC:  Mental status: Alert and oriented x4, no aphasia, good attention span, fund of knowledge and memory  Motor Exam - grossly normal Sensory Exam - grossly normal Reflexes: 1+ Coordination - grossly normal Gait - grossly normal Balance - grossly normal Cranial Nerves: I: smell Not tested  II: visual acuity  OS: nl    OD: nl  II: visual fields Full to confrontation  II: pupils Equal, round, reactive to light  III,VII: ptosis None  III,IV,VI: extraocular muscles  Full ROM  V: mastication Normal  V: facial light touch sensation  Normal  V,VII: corneal reflex  Present  VII: facial muscle function - upper  Normal  VII: facial muscle function - lower Normal  VIII: hearing Not tested  IX: soft palate elevation  Normal  IX,X: gag reflex Present  XI: trapezius strength  5/5  XI: sternocleidomastoid strength 5/5  XI: neck flexion strength  5/5  XII: tongue strength  Normal    Data Review Lab Results  Component Value Date   WBC 5.6 06/05/2014   HGB 12.7 06/05/2014   HCT 37.4 06/05/2014   MCV 88.4 06/05/2014   PLT 215 06/05/2014   Lab Results  Component Value Date   NA 132* 06/05/2014   K 3.2* 06/05/2014   CL 91* 06/05/2014   CO2 27 06/05/2014   BUN 7 06/05/2014   CREATININE 0.31* 06/05/2014   GLUCOSE 84 06/05/2014   No results found for: INR, PROTIME  Assessment:   Cervical neck pain with herniated nucleus pulposus/ spondylosis/ stenosis at C4-5C5-6C6-7. Patient has failed conservative therapy. Planned surgery : ACDF with plate C4-5, T6-5, Y6-5  Plan:   I explained the condition and procedure to the patient and answered any questions.  Patient wishes to proceed with procedure as planned. Understands risks/ benefits/ and expected or typical  outcomes.  Abdo Denault S 06/18/2014 7:52 AM

## 2014-06-18 NOTE — Anesthesia Preprocedure Evaluation (Addendum)
Anesthesia Evaluation  Patient identified by MRN, date of birth, ID band Patient awake    Reviewed: Allergy & Precautions, H&P , NPO status , Patient's Chart, lab work & pertinent test results  History of Anesthesia Complications (+) PONV  Airway Mallampati: I  TM Distance: >3 FB Neck ROM: Full    Dental  (+) Teeth Intact, Dental Advisory Given, Implants   Pulmonary Current Smoker,          Cardiovascular hypertension, Pt. on medications     Neuro/Psych  Headaches, negative psych ROS   GI/Hepatic Neg liver ROS, GERD-  ,  Endo/Other  negative endocrine ROS  Renal/GU negative Renal ROS     Musculoskeletal  (+) Arthritis -,   Abdominal   Peds  Hematology negative hematology ROS (+)   Anesthesia Other Findings   Reproductive/Obstetrics                            Anesthesia Physical Anesthesia Plan  ASA: II  Anesthesia Plan: General   Post-op Pain Management:    Induction: Intravenous  Airway Management Planned: Oral ETT  Additional Equipment:   Intra-op Plan:   Post-operative Plan: Extubation in OR  Informed Consent: I have reviewed the patients History and Physical, chart, labs and discussed the procedure including the risks, benefits and alternatives for the proposed anesthesia with the patient or authorized representative who has indicated his/her understanding and acceptance.   Dental advisory given  Plan Discussed with: CRNA  Anesthesia Plan Comments:         Anesthesia Quick Evaluation

## 2014-06-18 NOTE — Plan of Care (Signed)
Problem: Consults Goal: Diagnosis - Spinal Surgery Outcome: Completed/Met Date Met:  06/18/14 Cervical Spine Fusion

## 2014-06-18 NOTE — Transfer of Care (Signed)
Immediate Anesthesia Transfer of Care Note  Patient: Sherry Bender  Procedure(s) Performed: Procedure(s) with comments: C4-5 C5-6 C6-7 ANTERIOR CERVICAL DECOMPRESSION/DISKECTOMY/FUSION (N/A) - C4-5 C5-6 C6-7 ANTERIOR CERVICAL DECOMPRESSION/DISKECTOMY/FUSION  Patient Location: PACU  Anesthesia Type:General  Level of Consciousness: sedated  Airway & Oxygen Therapy: Patient Spontanous Breathing  Post-op Assessment: Report given to PACU RN  Post vital signs: Reviewed  Complications: No apparent anesthesia complications

## 2014-06-18 NOTE — Anesthesia Postprocedure Evaluation (Signed)
Anesthesia Post Note  Patient: Sherry Bender  Procedure(s) Performed: Procedure(s) (LRB): C4-5 C5-6 C6-7 ANTERIOR CERVICAL DECOMPRESSION/DISKECTOMY/FUSION (N/A)  Anesthesia type: General  Patient location: PACU  Post pain: Pain level controlled and Adequate analgesia  Post assessment: Post-op Vital signs reviewed, Patient's Cardiovascular Status Stable, Respiratory Function Stable, Patent Airway and Pain level controlled  Last Vitals:  Filed Vitals:   06/18/14 1545  BP: 112/40  Pulse: 70  Temp:   Resp: 20    Post vital signs: Reviewed and stable  Level of consciousness: awake, alert  and oriented  Complications: No apparent anesthesia complications

## 2014-06-18 NOTE — Op Note (Signed)
06/18/2014  2:40 PM  PATIENT:  Sherry Bender  63 y.o. 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 decompressive anterior cervical discectomy C4-5 C5-6 C6-7, 2. Anterior cervical arthrodesis C4-5 C5-6 C6-7 utilizing cortical cancellus allograft, 3. Anterior cervical plating C4-C7 inclusive utilizing a helix translational plate  SURGEON:  Sherley Bounds, MD  ASSISTANTS: Dr. Annette Stable  ANESTHESIA:   General  EBL: 250 ml  Total I/O In: 1300 [I.V.:1300] Out: 250 [Blood:250]  BLOOD ADMINISTERED:none  DRAINS: 7 flat JP   SPECIMEN:  No Specimen  INDICATION FOR PROCEDURE: This patient presented with a long history of neck pain with bilateral arm pain. Plain films and MRI showed spondylosis at C4-5 C5-6 and C6-7 with varying degrees of cervical canal and foraminal stenosis. She tried a Conservation officer, nature without relief. I recommended anterior cervical discectomy fusion plating C4-5 C5-6 and C6-7. Patient understood the risks, benefits, and alternatives and potential outcomes and wished to proceed.  PROCEDURE DETAILS: Patient was brought to the operating room placed under general endotracheal anesthesia. Patient was placed in the supine position on the operating room table. The neck was prepped with Duraprep and draped in a sterile fashion.   Three cc of local anesthesia was injected and a transverse incision was made on the right side of the neck.  Dissection was carried down thru the subcutaneous tissue and the platysma was  elevated, opened, and undermined with Metzenbaum scissors.  Dissection was then carried out thru an avascular plane leaving the sternocleidomastoid carotid artery and jugular vein laterally and the trachea and esophagus medially. The ventral aspect of the vertebral column was identified and a localizing x-ray was  taken. The C6-7 level was identified. The longus colli muscles were then elevated and the retractor was placed to expose C4-5 C5-6 and C6-7. Anterior osteophytes were removed from each level with a Leksell rongeur to expose the underlying disc space. The annulus was incised and the disc space entered. Discectomy was performed with micro-curettes and pituitary rongeurs. The disc spaces were quite collapsed making it very difficult to use the micro-curettes and pituitary rongeur. I then used the high-speed drill to drill the endplates down to the level of the posterior longitudinal ligament. The operating microscope was draped and brought into the field provided additional magnification, illumination and visualization and microdissection. Discectomy was continued posteriorly thru the disc space at each level. Posterior longitudinal ligament was opened with a nerve hook, and then removed along with disc herniation and osteophytes, decompressing the spinal canal and thecal sac at each level. We then continued to remove osteophytic overgrowth and disc material decompressing the neural foramina and exiting nerve roots bilaterally. The scope was angled up and down to help decompress and undercut the vertebral bodies. Once the decompression was completed we could pass a nerve hook circumferentially to assure adequate decompression in the midline and in the neural foramina. So by both visualization and palpation we felt we had an adequate decompression of the neural elements. We then measured the height of the intravertebral disc space and selected a 6 mm cortical cancellus allografts. It was then gently positioned in the intravertebral disc space and countersunk into each level. I then used a helix translational plate and placed fixed angle screws into the vertebral bodies and locked them into position. The wound was irrigated with bacitracin solution, checked for hemostasis which  was established and confirmed. Once  meticulous hemostasis was achieved, I placed a 7 flat JP drain and we then proceeded with closure. The platysma was closed with interrupted 3-0 undyed Vicryl suture, the subcuticular layer was closed with interrupted 3-0 undyed Vicryl suture. The skin edges were approximated with steristrips. The drapes were removed. A sterile dressing was applied. The patient was then awakened from general anesthesia and transferred to the recovery room in stable condition. At the end of the procedure all sponge, needle and instrument counts were correct.   PLAN OF CARE: Admit for overnight observation  PATIENT DISPOSITION:  PACU - hemodynamically stable.   Delay start of Pharmacological VTE agent (>24hrs) due to surgical blood loss or risk of bleeding:  yes

## 2014-06-18 NOTE — Anesthesia Procedure Notes (Signed)
Procedure Name: Intubation Date/Time: 06/18/2014 11:28 AM Performed by: Rush Farmer E Pre-anesthesia Checklist: Patient identified, Emergency Drugs available, Suction available, Patient being monitored and Timeout performed Patient Re-evaluated:Patient Re-evaluated prior to inductionOxygen Delivery Method: Circle system utilized Preoxygenation: Pre-oxygenation with 100% oxygen Intubation Type: IV induction and Cricoid Pressure applied Ventilation: Mask ventilation without difficulty Laryngoscope Size: Mac and 3 Grade View: Grade I Tube type: Oral Tube size: 7.0 mm Number of attempts: 1 Airway Equipment and Method: Stylet Placement Confirmation: ETT inserted through vocal cords under direct vision,  positive ETCO2 and breath sounds checked- equal and bilateral Secured at: 22 cm Tube secured with: Tape Dental Injury: Teeth and Oropharynx as per pre-operative assessment

## 2014-06-19 DIAGNOSIS — M4802 Spinal stenosis, cervical region: Secondary | ICD-10-CM | POA: Diagnosis not present

## 2014-06-19 DIAGNOSIS — C50919 Malignant neoplasm of unspecified site of unspecified female breast: Secondary | ICD-10-CM

## 2014-06-19 HISTORY — PX: MASTECTOMY: SHX3

## 2014-06-19 HISTORY — DX: Malignant neoplasm of unspecified site of unspecified female breast: C50.919

## 2014-06-19 MED ORDER — OXYCODONE-ACETAMINOPHEN 5-325 MG PO TABS: 1.0000 | ORAL_TABLET | ORAL | Status: DC | PRN

## 2014-06-19 NOTE — Discharge Summary (Signed)
Physician Discharge Summary  Patient ID: Sherry Bender MRN: [id number] DOB/AGE: 02-28-1951 64 y.o.  Admit date: 06/18/2014 Discharge date: 06/19/2014  Admission Diagnoses: C4-5, C5-6 and C6-7 disc degeneration, cervicalgia, cervical radiculopathy  Discharge Diagnoses: The same Active Problems:   S/P cervical spinal fusion   Discharged Condition: good  Hospital Course: Dr. Ronnald Bender performed a C4-5, C5-6 and C6-7 anterior cervical discectomy, fusion, and plating on the patient on 06/18/2014.  The patient's postoperative course was unremarkable. She requested discharge to home on postoperative day #1. She was given oral and written discharge instructions. All her questions were answered.  Consults: None Significant Diagnostic Studies: None Treatments: C4-5, C5-6 and C6-7 anterior cervical discectomy, fusion, and plating. Discharge Exam: Blood pressure 145/84, pulse 85, temperature 97.7 F (36.5 C), temperature source Oral, resp. rate 20, height 5\' 7"  (1.702 m), weight 57.607 kg (127 lb), last menstrual period 02/28/1999, SpO2 98 %. The patient is alert and pleasant. She is moving all 4 extremes well. She looks well. Her dressing is clean and dry. There is no hematoma or shift. I removed the drain.  Disposition: Home  Discharge Instructions    Call MD for:  difficulty breathing, headache or visual disturbances    Complete by:  As directed      Call MD for:  extreme fatigue    Complete by:  As directed      Call MD for:  hives    Complete by:  As directed      Call MD for:  persistant dizziness or light-headedness    Complete by:  As directed      Call MD for:  persistant nausea and vomiting    Complete by:  As directed      Call MD for:  redness, tenderness, or signs of infection (pain, swelling, redness, odor or green/yellow discharge around incision site)    Complete by:  As directed      Call MD for:  severe uncontrolled pain    Complete by:  As directed      Call MD for:   temperature >100.4    Complete by:  As directed      Diet - low sodium heart healthy    Complete by:  As directed      Discharge instructions    Complete by:  As directed   Call [phone] for a followup appointment. Take a stool softener while you are using pain medications.     Driving Restrictions    Complete by:  As directed   Do not drive for 2 weeks.     Increase activity slowly    Complete by:  As directed      Lifting restrictions    Complete by:  As directed   Do not lift more than 5 pounds. No excessive bending or twisting.     May shower / Bathe    Complete by:  As directed   He may shower after the pain she is removed 3 days after surgery. Leave the incision alone.     Remove dressing in 48 hours    Complete by:  As directed   Your stitches are under the scan and will dissolve by themselves. The Steri-Strips will fall off after you take a few showers. Do not rub back or pick at the wound, Leave the wound alone.            Medication List    STOP taking these medications  acetaminophen 325 MG tablet  Commonly known as:  TYLENOL     ALEVE 220 MG Caps  Generic drug:  Naproxen Sodium     methylPREDNIsolone 4 MG tablet  Commonly known as:  MEDROL DOSPACK     traMADol 50 MG tablet  Commonly known as:  ULTRAM      TAKE these medications        AZO BLADDER CONTROL/GO-LESS PO  Take 1 tablet by mouth 2 (two) times daily.     DEXILANT 60 MG capsule  Generic drug:  dexlansoprazole  Take 60 mg by mouth daily.     estradiol 1 MG tablet  Commonly known as:  ESTRACE  Take 1 mg by mouth daily.     hydrochlorothiazide 25 MG tablet  Commonly known as:  HYDRODIURIL  Take 1 tablet (25 mg total) by mouth daily.     oxyCODONE-acetaminophen 5-325 MG per tablet  Commonly known as:  PERCOCET/ROXICET  Take 1-2 tablets by mouth every 4 (four) hours as needed for moderate pain.     solifenacin 5 MG tablet  Commonly known as:  VESICARE  Take 2 tablets (10 mg  total) by mouth daily.         SignedNewman Bender D 06/19/2014, 9:09 AM

## 2014-06-19 NOTE — Progress Notes (Signed)
Patient alert and oriented, mae's well, voiding adequate amount of urine, swallowing without difficulty, no c/o pain. Patient discharged home with family. Script and discharged instructions given to patient. Patient and family stated understanding of d/c instructions given and has an appointment with MD. Aisha Brezlyn Manrique RN 

## 2014-06-20 ENCOUNTER — Emergency Department (HOSPITAL_COMMUNITY)
Admission: EM | Admit: 2014-06-20 | Discharge: 2014-06-21 | Disposition: A | Discharge status: Home / Self Care | Payer: Managed Care, Other (non HMO) | Attending: Emergency Medicine | Admitting: Emergency Medicine

## 2014-06-20 ENCOUNTER — Emergency Department (HOSPITAL_COMMUNITY): Payer: Managed Care, Other (non HMO)

## 2014-06-20 ENCOUNTER — Other Ambulatory Visit: Payer: Self-pay

## 2014-06-20 ENCOUNTER — Encounter (HOSPITAL_COMMUNITY): Payer: Self-pay | Admitting: Emergency Medicine

## 2014-06-20 DIAGNOSIS — M7989 Other specified soft tissue disorders: Secondary | ICD-10-CM | POA: Diagnosis not present

## 2014-06-20 DIAGNOSIS — R609 Edema, unspecified: Secondary | ICD-10-CM | POA: Diagnosis not present

## 2014-06-20 DIAGNOSIS — R05 Cough: Secondary | ICD-10-CM | POA: Diagnosis not present

## 2014-06-20 DIAGNOSIS — M5013 Cervical disc disorder with radiculopathy, cervicothoracic region: Secondary | ICD-10-CM | POA: Diagnosis not present

## 2014-06-20 DIAGNOSIS — M1990 Unspecified osteoarthritis, unspecified site: Secondary | ICD-10-CM | POA: Diagnosis not present

## 2014-06-20 DIAGNOSIS — K219 Gastro-esophageal reflux disease without esophagitis: Secondary | ICD-10-CM | POA: Diagnosis not present

## 2014-06-20 DIAGNOSIS — Z720 Tobacco use: Secondary | ICD-10-CM | POA: Insufficient documentation

## 2014-06-20 DIAGNOSIS — Z8639 Personal history of other endocrine, nutritional and metabolic disease: Secondary | ICD-10-CM | POA: Insufficient documentation

## 2014-06-20 DIAGNOSIS — R0602 Shortness of breath: Secondary | ICD-10-CM | POA: Insufficient documentation

## 2014-06-20 DIAGNOSIS — I10 Essential (primary) hypertension: Secondary | ICD-10-CM | POA: Insufficient documentation

## 2014-06-20 DIAGNOSIS — Z85828 Personal history of other malignant neoplasm of skin: Secondary | ICD-10-CM | POA: Diagnosis not present

## 2014-06-20 DIAGNOSIS — R079 Chest pain, unspecified: Secondary | ICD-10-CM | POA: Insufficient documentation

## 2014-06-20 DIAGNOSIS — Z79899 Other long term (current) drug therapy: Secondary | ICD-10-CM | POA: Diagnosis not present

## 2014-06-20 LAB — BASIC METABOLIC PANEL
Anion gap: 14 (ref 5–15)
BUN: 8 mg/dL (ref 6–23)
CO2: 22 mmol/L (ref 19–32)
Calcium: 9.1 mg/dL (ref 8.4–10.5)
Chloride: 97 mEq/L (ref 96–112)
Creatinine, Ser: 0.37 mg/dL — ABNORMAL LOW (ref 0.50–1.10)
GFR calc Af Amer: >90 mL/min (ref >90)
GFR calc non Af Amer: >90 mL/min (ref >90)
Glucose, Bld: 118 mg/dL — ABNORMAL HIGH (ref 70–99)
Potassium: 3.4 mmol/L — ABNORMAL LOW (ref 3.5–5.1)
Sodium: 133 mmol/L — ABNORMAL LOW (ref 135–145)

## 2014-06-20 LAB — CBC
HCT: 31.5 % — ABNORMAL LOW (ref 36.0–46.0)
Hemoglobin: 10.4 g/dL — ABNORMAL LOW (ref 12.0–15.0)
MCH: 29.7 pg (ref 26.0–34.0)
MCHC: 33 g/dL (ref 30.0–36.0)
MCV: 90 fL (ref 78.0–100.0)
Platelets: 233 10*3/uL (ref 150–400)
RBC: 3.5 MIL/uL — ABNORMAL LOW (ref 3.87–5.11)
RDW: 13 % (ref 11.5–15.5)
WBC: 7.4 10*3/uL (ref 4.0–10.5)

## 2014-06-20 LAB — I-STAT TROPONIN, ED: Troponin i, poc: 0 ng/mL (ref 0.00–0.08)

## 2014-06-20 LAB — BRAIN NATRIURETIC PEPTIDE: B Natriuretic Peptide: 99.2 pg/mL (ref 0.0–100.0)

## 2014-06-20 MED ORDER — FENTANYL CITRATE 0.05 MG/ML IJ SOLN
50.0000 ug | Freq: Once | INTRAMUSCULAR | Status: AC
  Administered 2014-06-21: 50 ug via INTRAVENOUS
  Filled 2014-06-20: qty 2

## 2014-06-20 MED ORDER — IOHEXOL 350 MG/ML SOLN
75.0000 mL | Freq: Once | INTRAVENOUS | Status: AC | PRN
  Administered 2014-06-20: 75 mL via INTRAVENOUS

## 2014-06-20 NOTE — ED Provider Notes (Signed)
CSN: [id number]     Arrival date & time 06/20/14  2025 History   First MD Initiated Contact with Patient 06/20/14 2115     Chief Complaint  Patient presents with  . Chest Pain     (Consider location/radiation/quality/duration/timing/severity/associated sxs/prior Treatment) HPI Comments: Patient had cervical fusion 06/18/14 and DC hospital the next day since 3:30 AM patient has had chest pain that started in neck and radiated to L arm and chest that has persisted. She has also noted bilateral lower leg edema   Patient is a 64 y.o. female presenting with chest pain. The history is provided by the patient.  Chest Pain Pain location:  L chest Pain quality: pressure   Pain radiates to:  L arm and L shoulder Pain radiates to the back: no   Pain severity:  Moderate Onset quality:  Gradual Duration:  1 day Timing:  Constant Progression:  Worsening Chronicity:  New Context: at rest   Relieved by:  Nothing Worsened by:  Nothing tried Ineffective treatments:  Certain positions (percocet) Associated symptoms: cough, lower extremity edema and shortness of breath   Associated symptoms: no back pain and no fever   Cough:    Cough characteristics:  Non-productive   Severity:  Mild   Onset quality:  Gradual   Duration:  1 day   Timing:  Intermittent Risk factors: surgery     Past Medical History  Diagnosis Date  . Hyperlipidemia   . Hypertension   . Complication of anesthesia     "HARD TO WAKE UP".......Marland KitchenN/V  . PONV (postoperative nausea and vomiting)   . GERD (gastroesophageal reflux disease)   . Headache     FROM THE NECK  . Arthritis     IN BACK....  . Scoliosis   . 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 Cholecystectomy  1994  . Abdominal hysterectomy  2000    TAH/BSO  . Knee arthroscopy  2005    right knee  . Foot neuroma surgery  01/2010    also had metatarasl shortened  . Rhinoplasty    . Cervical fusion     Family  History  Problem Relation Age of Onset  . Heart disease Mother   . Hypertension Mother   . Diabetes Mother   . Heart disease Father   . Hypertension Father   . Cancer Paternal Aunt     ? breast  . Cancer Maternal Grandmother     ? ovarian cancer   History  Substance Use Topics  . Smoking status: Current Every Day Smoker -- 0.50 packs/day for 42 years    Types: Cigarettes  . Smokeless tobacco: Not on file  . Alcohol Use: No   OB History    No data available     Review of Systems  Constitutional: Negative for fever.  Respiratory: Positive for cough and shortness of breath.   Cardiovascular: Positive for chest pain and leg swelling.  Musculoskeletal: Positive for neck pain. Negative for back pain, arthralgias and neck stiffness.      Allergies  Benicar; Clonidine derivatives; Codeine; Dilaudid; Lisinopril; Norvasc; and Toprol xl  Home Medications   Prior to Admission medications   Medication Sig Start Date End Date Taking? Authorizing Provider  DEXILANT 60 MG capsule Take 60 mg by mouth daily. 04/05/14  Yes Historical Provider, MD  estradiol (ESTRACE) 1 MG tablet Take 1 mg by mouth daily.     Yes Historical  Provider, MD  hydrochlorothiazide (HYDRODIURIL) 25 MG tablet Take 1 tablet (25 mg total) by mouth daily. 06/15/11  Yes Debbrah Alar, NP  oxyCODONE-acetaminophen (PERCOCET/ROXICET) 5-325 MG per tablet Take 1-2 tablets by mouth every 4 (four) hours as needed for moderate pain. 06/19/14  Yes Newman Pies, MD  Pumpkin Seed-Soy Germ (AZO BLADDER CONTROL/GO-LESS PO) Take 1 tablet by mouth 2 (two) times daily.   Yes Historical Provider, MD  solifenacin (VESICARE) 5 MG tablet Take 2 tablets (10 mg total) by mouth daily. 04/08/13  Yes Mary-Margaret Hassell Done, FNP  methylPREDNISolone (MEDROL DOSEPAK) 4 MG tablet follow package directions. Take for 21 days 06/21/14 07/12/14  Garald Balding, NP  methylPREDNISolone (MEDROL DOSEPAK) 4 MG tablet Day one- two--- 4 tabs Day  three-four---3 tab Day five-six----2 tabs Day seven-eight  1 tab 06/21/14   Garald Balding, NP   BP 160/92 mmHg  Pulse 91  Temp(Src) 98.2 F (36.8 C) (Oral)  Resp 18  Ht 5\' 7"  (1.702 m)  Wt 127 lb (57.607 kg)  BMI 19.89 kg/m2  SpO2 95%  LMP 02/28/1999 Physical Exam  Constitutional: She is oriented to person, place, and time. She appears well-developed and well-nourished. No distress.  HENT:  Mouth/Throat: Oropharynx is clear and moist.  Eyes: Pupils are equal, round, and reactive to light.  Neck: Normal range of motion.    Cardiovascular: Normal rate.   Pulmonary/Chest: Effort normal and breath sounds normal. No respiratory distress. She has no wheezes.  Abdominal: Soft.  Musculoskeletal: Normal range of motion. She exhibits edema. She exhibits no tenderness.       Legs: Neurological: She is alert and oriented to person, place, and time.  Skin: Skin is warm.  Nursing note and vitals reviewed.   ED Course  Procedures (including critical care time) Labs Review Labs Reviewed  CBC - Abnormal; Notable for the following:    RBC 3.50 (*)    Hemoglobin 10.4 (*)    HCT 31.5 (*)    All other components within normal limits  BASIC METABOLIC PANEL - Abnormal; Notable for the following:    Sodium 133 (*)    Potassium 3.4 (*)    Glucose, Bld 118 (*)    Creatinine, Ser 0.37 (*)    All other components within normal limits  BRAIN NATRIURETIC PEPTIDE  I-STAT TROPOININ, ED  Randolm Idol, ED    Imaging Review Dg Chest 2 View  06/20/2014   CLINICAL DATA:  Chest pain, cough, shortness of breath today. ACDF surgery on Thursday and release yesterday.  EXAM: CHEST  2 VIEW  COMPARISON:  11/17/2013  FINDINGS: Pulmonary hyperinflation. Normal heart size and pulmonary vascularity. No focal airspace disease or consolidation in the lungs. No blunting of costophrenic angles. No pneumothorax. Mediastinal contours appear intact. Mild thoracic scoliosis convex towards the right. Postoperative  changes in the lower cervical spine. Surgical clips in the right upper quadrant. Degenerative changes in the thoracic spine.  IMPRESSION: No active cardiopulmonary disease.   Electronically Signed   By: Lucienne Capers M.D.   On: 06/20/2014 22:32   Ct Angio Chest Pe W/cm &/or Wo Cm  06/20/2014   CLINICAL DATA:  Chest pain and shortness of breath. Left arm pain. Cervical fusion on December 31st.  EXAM: CT ANGIOGRAPHY CHEST WITH CONTRAST  TECHNIQUE: Multidetector CT imaging of the chest was performed using the standard protocol during bolus administration of intravenous contrast. Multiplanar CT image reconstructions and MIPs were obtained to evaluate the vascular anatomy.  CONTRAST:  61mL OMNIPAQUE IOHEXOL  350 MG/ML SOLN  COMPARISON:  None.  FINDINGS: Technically adequate study with good opacification of the central and segmental pulmonary arteries. No focal filling defects demonstrated. No evidence of significant pulmonary embolus.  Normal heart size. Normal caliber thoracic aorta. No evidence of aortic dissection. Great vessel origins are patent. Soft tissue gas collections in the base of the neck likely represent postoperative changes arising from recent cervical spine surgery. No discrete fluid collection is identified. The small calcified lymph nodes in the right hilum and mediastinum consistent with postinflammatory changes. Calcified granulomas in the lungs. No significant lymphadenopathy in the chest. Esophagus is decompressed. Small amount of mucoid material in the trachea. Airways are otherwise patent.  Mild atelectasis in the lung bases. No focal airspace disease or consolidation. No pneumothorax. No blunting of costophrenic angles. Degenerative changes in the spine. Postoperative changes in lower cervical spine. No destructive bone lesions appreciated.  Visualized portions of the upper abdominal organs are grossly unremarkable.  Review of the MIP images confirms the above findings.  IMPRESSION: No  evidence of significant pulmonary embolus. Atelectasis in the lung bases. No evidence of active pulmonary disease. Soft tissue gas in the base of the neck is likely postoperative.   Electronically Signed   By: Lucienne Capers M.D.   On: 06/20/2014 22:57     EKG Interpretation   Date/Time:  Saturday June 20 2014 20:34:15 EST Ventricular Rate:  82 PR Interval:  126 QRS Duration: 88 QT Interval:  368 QTC Calculation: 429 R Axis:   59 Text Interpretation:  Normal sinus rhythm Normal ECG Confirmed by HARRISON   MD, FORREST ([id number]) on 06/20/2014 11:57:30 PM      MDM  Spoke with Dr. Beatriz Stallion concerning patient's symptoms.  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

## 2014-06-20 NOTE — ED Notes (Signed)
Pt. reports mid chest pain with SOB , productive cough and nausea onset this  Morning , pt. Had a recent cervical fusion surgery last 06/18/2014 by Dr. Sherley Bounds.

## 2014-06-21 LAB — I-STAT TROPONIN, ED: Troponin i, poc: 0.01 ng/mL (ref 0.00–0.08)

## 2014-06-21 MED ORDER — METHYLPREDNISOLONE SODIUM SUCC 125 MG IJ SOLR
125.0000 mg | Freq: Once | INTRAMUSCULAR | Status: AC
  Administered 2014-06-21: 125 mg via INTRAVENOUS
  Filled 2014-06-21: qty 2

## 2014-06-21 MED ORDER — METHYLPREDNISOLONE 4 MG PO KIT: PACK | ORAL | Status: AC

## 2014-06-21 MED ORDER — METHYLPREDNISOLONE 4 MG PO KIT: PACK | ORAL | Status: DC

## 2014-06-21 NOTE — Discharge Instructions (Signed)
Thank you for being so patient while your work up was in progress  Your cardiac and pulmonary evaluation is all normal I spoke with Dr. Saintclair Halsted who recommend the use to the steroid dose pack in conjunction with your pain medication and reexamination by Dr. Ronnald Ramp on Monday

## 2014-06-22 ENCOUNTER — Encounter (HOSPITAL_COMMUNITY): Payer: Self-pay | Admitting: Neurological Surgery

## 2014-06-24 ENCOUNTER — Inpatient Hospital Stay (HOSPITAL_COMMUNITY)
Admission: AD | Admit: 2014-06-24 | Discharge: 2014-06-26 | DRG: 517 | Disposition: A | Discharge status: Home / Self Care | Payer: Managed Care, Other (non HMO) | Source: Ambulatory Visit | Attending: Neurological Surgery | Admitting: Neurological Surgery

## 2014-06-24 ENCOUNTER — Other Ambulatory Visit: Payer: Self-pay | Admitting: Neurological Surgery

## 2014-06-24 ENCOUNTER — Encounter (HOSPITAL_COMMUNITY): Payer: Self-pay | Admitting: Neurological Surgery

## 2014-06-24 ENCOUNTER — Inpatient Hospital Stay (HOSPITAL_COMMUNITY): Payer: Managed Care, Other (non HMO)

## 2014-06-24 DIAGNOSIS — E785 Hyperlipidemia, unspecified: Secondary | ICD-10-CM | POA: Diagnosis present

## 2014-06-24 DIAGNOSIS — Z888 Allergy status to other drugs, medicaments and biological substances status: Secondary | ICD-10-CM | POA: Diagnosis not present

## 2014-06-24 DIAGNOSIS — Z85828 Personal history of other malignant neoplasm of skin: Secondary | ICD-10-CM

## 2014-06-24 DIAGNOSIS — M1990 Unspecified osteoarthritis, unspecified site: Secondary | ICD-10-CM | POA: Diagnosis present

## 2014-06-24 DIAGNOSIS — Z886 Allergy status to analgesic agent status: Secondary | ICD-10-CM

## 2014-06-24 DIAGNOSIS — T8489XA Other specified complication of internal orthopedic prosthetic devices, implants and grafts, initial encounter: Principal | ICD-10-CM | POA: Diagnosis present

## 2014-06-24 DIAGNOSIS — F17210 Nicotine dependence, cigarettes, uncomplicated: Secondary | ICD-10-CM | POA: Diagnosis present

## 2014-06-24 DIAGNOSIS — I10 Essential (primary) hypertension: Secondary | ICD-10-CM | POA: Diagnosis present

## 2014-06-24 DIAGNOSIS — K219 Gastro-esophageal reflux disease without esophagitis: Secondary | ICD-10-CM | POA: Diagnosis present

## 2014-06-24 DIAGNOSIS — Y831 Surgical operation with implant of artificial internal device as the cause of abnormal reaction of the patient, or of later complication, without mention of misadventure at the time of the procedure: Secondary | ICD-10-CM | POA: Diagnosis present

## 2014-06-24 DIAGNOSIS — M542 Cervicalgia: Secondary | ICD-10-CM | POA: Diagnosis present

## 2014-06-24 DIAGNOSIS — Z9049 Acquired absence of other specified parts of digestive tract: Secondary | ICD-10-CM | POA: Diagnosis present

## 2014-06-24 DIAGNOSIS — Z90710 Acquired absence of both cervix and uterus: Secondary | ICD-10-CM

## 2014-06-24 DIAGNOSIS — Z419 Encounter for procedure for purposes other than remedying health state, unspecified: Secondary | ICD-10-CM

## 2014-06-24 DIAGNOSIS — Z981 Arthrodesis status: Secondary | ICD-10-CM

## 2014-06-24 MED ORDER — MENTHOL 3 MG MT LOZG: 1.0000 | LOZENGE | OROMUCOSAL | Status: DC | PRN

## 2014-06-24 MED ORDER — POTASSIUM CHLORIDE IN NACL 20-0.9 MEQ/L-% IV SOLN
INTRAVENOUS | Status: DC
  Administered 2014-06-24: 18:00:00 via INTRAVENOUS
  Filled 2014-06-24 (×5): qty 1000

## 2014-06-24 MED ORDER — SODIUM CHLORIDE 0.9 % IV SOLN: 250.0000 mL | INTRAVENOUS | Status: DC

## 2014-06-24 MED ORDER — SODIUM CHLORIDE 0.9 % IJ SOLN: 3.0000 mL | INTRAMUSCULAR | Status: DC | PRN

## 2014-06-24 MED ORDER — ESTRADIOL 1 MG PO TABS
1.0000 mg | ORAL_TABLET | Freq: Every day | ORAL | Status: DC
  Filled 2014-06-24 (×2): qty 1

## 2014-06-24 MED ORDER — SODIUM CHLORIDE 0.9 % IJ SOLN
3.0000 mL | Freq: Two times a day (BID) | INTRAMUSCULAR | Status: DC
  Administered 2014-06-24 – 2014-06-25 (×2): 3 mL via INTRAVENOUS

## 2014-06-24 MED ORDER — PHENOL 1.4 % MT LIQD: 1.0000 | OROMUCOSAL | Status: DC | PRN

## 2014-06-24 MED ORDER — DARIFENACIN HYDROBROMIDE ER 7.5 MG PO TB24
7.5000 mg | ORAL_TABLET | Freq: Every day | ORAL | Status: DC
  Filled 2014-06-24 (×2): qty 1

## 2014-06-24 MED ORDER — HYDROCHLOROTHIAZIDE 25 MG PO TABS
25.0000 mg | ORAL_TABLET | Freq: Every day | ORAL | Status: DC
  Filled 2014-06-24 (×2): qty 1

## 2014-06-24 MED ORDER — MORPHINE SULFATE 2 MG/ML IJ SOLN
1.0000 mg | INTRAMUSCULAR | Status: DC | PRN
  Administered 2014-06-24: 4 mg via INTRAVENOUS
  Administered 2014-06-24 – 2014-06-25 (×2): 2 mg via INTRAVENOUS
  Administered 2014-06-25 (×2): 4 mg via INTRAVENOUS
  Filled 2014-06-24: qty 1
  Filled 2014-06-24 (×3): qty 2

## 2014-06-24 MED ORDER — PANTOPRAZOLE SODIUM 40 MG PO TBEC: 40.0000 mg | DELAYED_RELEASE_TABLET | Freq: Every day | ORAL | Status: DC

## 2014-06-24 MED ORDER — ACETAMINOPHEN 325 MG PO TABS: 650.0000 mg | ORAL_TABLET | ORAL | Status: DC | PRN

## 2014-06-24 MED ORDER — OXYCODONE-ACETAMINOPHEN 5-325 MG PO TABS
1.0000 | ORAL_TABLET | ORAL | Status: DC | PRN
  Administered 2014-06-24 – 2014-06-26 (×4): 2 via ORAL
  Filled 2014-06-24 (×4): qty 2

## 2014-06-24 MED ORDER — MORPHINE SULFATE 2 MG/ML IJ SOLN
INTRAMUSCULAR | Status: AC
  Administered 2014-06-24: 2 mg via INTRAVENOUS
  Filled 2014-06-24: qty 1

## 2014-06-24 MED ORDER — ONDANSETRON HCL 4 MG/2ML IJ SOLN
4.0000 mg | INTRAMUSCULAR | Status: DC | PRN
  Administered 2014-06-24 – 2014-06-25 (×4): 4 mg via INTRAVENOUS
  Filled 2014-06-24 (×4): qty 2

## 2014-06-24 MED ORDER — ACETAMINOPHEN 650 MG RE SUPP
650.0000 mg | RECTAL | Status: DC | PRN
Start: 2014-06-24 — End: 2014-06-25

## 2014-06-24 NOTE — H&P (Signed)
Subjective:   Patient is a 64 y.o. female seen regarding anterior cervical hardware failure. She is status post 3 level ACDF with plating last week. The patient first presented with complaints of neck pain. Onset of symptoms was 5 days ago, unchanged since that time. Onset was not related to no known injury. The pain is described as aching and occurs intermittently. The pain is rated moderate, and is located at the right side of the neck without radiation to the arms.  Symptoms are exacerbated by extending head backwards, and are relieved by none.   Previous work up includes cervical spine x-rays, results: Shows the grafts to be in good position but the inferior screws in the C7 vertebral body has backed out a few millimeters. This suggests the locking mechanism in the plate has failed. She is admitted and planned for anterior cervical revision tomorrow.  Past Medical History  Diagnosis Date  . Hyperlipidemia   . Hypertension   . Complication of anesthesia     "HARD TO WAKE UP".......Marland KitchenN/V  . PONV (postoperative nausea and vomiting)   . GERD (gastroesophageal reflux disease)   . Headache     FROM THE NECK  . Arthritis     IN BACK....  . Scoliosis   . 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 Cholecystectomy  1994  . Abdominal hysterectomy  2000    TAH/BSO  . Knee arthroscopy  2005    right knee  . Foot neuroma surgery  01/2010    also had metatarasl shortened  . Rhinoplasty    . Cervical fusion    . Anterior cervical decomp/discectomy fusion N/A 06/18/2014    Procedure: C4-5 C5-6 C6-7 ANTERIOR CERVICAL DECOMPRESSION/DISKECTOMY/FUSION;  Surgeon: Eustace Moore, MD;  Location: [facility];  Service: Neurosurgery;  Laterality: N/A;  C4-5 C5-6 C6-7 ANTERIOR CERVICAL DECOMPRESSION/DISKECTOMY/FUSION    Allergies  Allergen Reactions  . Benicar [Olmesartan Medoxomil]     Fatigue   . Clonidine Derivatives Other (See Comments)    Drowsiness,  pain in arms.  . Codeine Nausea And Vomiting  . Dilaudid [Hydromorphone] Other (See Comments)    Chest pressure, anxiety  . Lisinopril     Neck pain, muscle pain.  . Norvasc [Amlodipine Besylate]     Muscle pain  . Toprol Xl [Metoprolol Succinate]     Muscle pain    History  Substance Use Topics  . Smoking status: Current Every Day Smoker -- 0.50 packs/day for 42 years    Types: Cigarettes  . Smokeless tobacco: Not on file  . Alcohol Use: No    Family History  Problem Relation Age of Onset  . Heart disease Mother   . Hypertension Mother   . Diabetes Mother   . Heart disease Father   . Hypertension Father   . Cancer Paternal Aunt     ? breast  . Cancer Maternal Grandmother     ? ovarian cancer   Prior to Admission medications   Medication Sig Start Date End Date Taking? Authorizing Provider  DEXILANT 60 MG capsule Take 60 mg by mouth daily. 04/05/14   Historical Provider, MD  estradiol (ESTRACE) 1 MG tablet Take 1 mg by mouth daily.      Historical Provider, MD  hydrochlorothiazide (HYDRODIURIL) 25 MG tablet Take 1 tablet (25 mg total) by mouth daily. 06/15/11   Debbrah Alar, NP  methylPREDNISolone (MEDROL DOSEPAK) 4 MG tablet follow package directions.  Take for 21 days 06/21/14 07/12/14  Garald Balding, NP  methylPREDNISolone (MEDROL DOSEPAK) 4 MG tablet Day one- two--- 4 tabs Day three-four---3 tab Day five-six----2 tabs Day seven-eight  1 tab 06/21/14   Garald Balding, NP  oxyCODONE-acetaminophen (PERCOCET/ROXICET) 5-325 MG per tablet Take 1-2 tablets by mouth every 4 (four) hours as needed for moderate pain. 06/19/14   Newman Pies, MD  Pumpkin Seed-Soy Germ (AZO BLADDER CONTROL/GO-LESS PO) Take 1 tablet by mouth 2 (two) times daily.    Historical Provider, MD  solifenacin (VESICARE) 5 MG tablet Take 2 tablets (10 mg total) by mouth daily. 04/08/13   Mary-Margaret Hassell Done, FNP     Review of Systems  Positive ROS: neg  All other systems have been reviewed and were  otherwise negative with the exception of those mentioned in the HPI and as above.  Objective: Vital signs in last 24 hours: Pulse Rate:  [92-102] 92 (01/06 1600) Resp:  [16-28] 16 (01/06 1600) BP: (92-164)/(67-91) 164/81 mmHg (01/06 1600) SpO2:  [98 %-99 %] 98 % (01/06 1600) Weight:  [115 lb 8.3 oz (52.4 kg)] 115 lb 8.3 oz (52.4 kg) (01/06 1400)  General Appearance: Alert, cooperative, no distress, appears stated age Head: Normocephalic, without obvious abnormality, atraumatic Eyes: PERRL, conjunctiva/corneas clear, EOM's intact, fundi benign, both eyes      Ears: Normal TM's and external ear canals, both ears Throat: Lips, mucosa, and tongue normal; teeth and gums normal Neck: Supple, symmetrical, trachea midline, no adenopathy; thyroid: No enlargement/tenderness/nodules; no carotid bruit or JVD Back: Symmetric, no curvature, ROM normal, no CVA tenderness Lungs: Clear to auscultation bilaterally, respirations unlabored Heart: Regular rate and rhythm, S1 and S2 normal, no murmur, rub or gallop Abdomen: Soft, non-tender, bowel sounds active all four quadrants, no masses, no organomegaly Extremities: Extremities normal, atraumatic, no cyanosis or edema Pulses: 2+ and symmetric all extremities Skin: Skin color, texture, turgor normal, no rashes or lesions  NEUROLOGIC:   Mental status: A&O x4, no aphasia, good AS, fund of knowledge and memory Motor Exam - grossly normal Sensory Exam - grossly normal Reflexes: Coordination - grossly normal Gait - grossly normal Balance - grossly normal Cranial Nerves: I: smell Not tested  II: visual acuity  OS: na    OD: na  II: visual fields Full to confrontation  II: pupils Equal, round, reactive to light  III,VII: ptosis None  III,IV,VI: extraocular muscles  Full ROM  V: mastication Normal  V: facial light touch sensation  Normal  V,VII: corneal reflex  Present  VII: facial muscle function - upper  Normal  VII: facial muscle function - lower  Normal  VIII: hearing Not tested  IX: soft palate elevation  Normal  IX,X: gag reflex Present  XI: trapezius strength  5/5  XI: sternocleidomastoid strength 5/5  XI: neck flexion strength  5/5  XII: tongue strength  Normal    Data Review Lab Results  Component Value Date   WBC 7.4 06/20/2014   HGB 10.4* 06/20/2014   HCT 31.5* 06/20/2014   MCV 90.0 06/20/2014   PLT 233 06/20/2014   Lab Results  Component Value Date   NA 133* 06/20/2014   K 3.4* 06/20/2014   CL 97 06/20/2014   CO2 22 06/20/2014   BUN 8 06/20/2014   CREATININE 0.37* 06/20/2014   GLUCOSE 118* 06/20/2014   No results found for: INR, PROTIME  Assessment/Plan: Hardware failure after three-level ACDF last week. Inferior screws in the plate have backed out suggesting the locking mechanism  in the plate had failed. Otherwise the plate remains in good position. The plan is to admit her overnight and revised the plate tomorrow.   JONES,DAVID S 06/24/2014 4:21 PM

## 2014-06-24 NOTE — Anesthesia Preprocedure Evaluation (Addendum)
Anesthesia Evaluation  Patient identified by MRN, date of birth, ID band Patient awake    Reviewed: Allergy & Precautions, NPO status , Patient's Chart, lab work & pertinent test results, reviewed documented beta blocker date and time   History of Anesthesia Complications (+) PONV  Airway Mallampati: II   Neck ROM: Full    Dental  (+) Teeth Intact, Dental Advisory Given   Pulmonary Current Smoker (1/2 ppd x 42 years),  breath sounds clear to auscultation        Cardiovascular hypertension, Pt. on medications Rhythm:Regular  EKG WNL   Neuro/Psych Anxiety    GI/Hepatic Neg liver ROS, GERD-  Medicated,  Endo/Other  negative endocrine ROS  Renal/GU negative Renal ROS     Musculoskeletal   Abdominal (+)  Abdomen: soft.    Peds  Hematology  (+) anemia , 10/31 H/H   Anesthesia Other Findings   Reproductive/Obstetrics                           Anesthesia Physical Anesthesia Plan  ASA: III  Anesthesia Plan: General   Post-op Pain Management:    Induction: Intravenous  Airway Management Planned: Oral ETT  Additional Equipment:   Intra-op Plan:   Post-operative Plan: Extubation in OR  Informed Consent: I have reviewed the patients History and Physical, chart, labs and discussed the procedure including the risks, benefits and alternatives for the proposed anesthesia with the patient or authorized representative who has indicated his/her understanding and acceptance.     Plan Discussed with:   Anesthesia Plan Comments: (Multinodal pain RX)        Anesthesia Quick Evaluation

## 2014-06-25 ENCOUNTER — Inpatient Hospital Stay (HOSPITAL_COMMUNITY): Payer: Managed Care, Other (non HMO) | Admitting: Anesthesiology

## 2014-06-25 ENCOUNTER — Encounter (HOSPITAL_COMMUNITY): Payer: Self-pay | Admitting: Anesthesiology

## 2014-06-25 ENCOUNTER — Encounter (HOSPITAL_COMMUNITY)
Admission: AD | Disposition: A | Discharge status: Home / Self Care | Payer: Self-pay | Source: Ambulatory Visit | Attending: Neurological Surgery

## 2014-06-25 ENCOUNTER — Inpatient Hospital Stay (HOSPITAL_COMMUNITY): Payer: Managed Care, Other (non HMO)

## 2014-06-25 HISTORY — PX: ANTERIOR CERVICAL DECOMP/DISCECTOMY FUSION: SHX1161

## 2014-06-25 SURGERY — ANTERIOR CERVICAL DECOMPRESSION/DISCECTOMY FUSION 2 LEVELS
Anesthesia: General | Site: Spine Cervical

## 2014-06-25 MED ORDER — GLYCOPYRROLATE 0.2 MG/ML IJ SOLN
INTRAMUSCULAR | Status: AC
  Filled 2014-06-25: qty 3

## 2014-06-25 MED ORDER — MIDAZOLAM HCL 2 MG/2ML IJ SOLN
INTRAMUSCULAR | Status: AC
  Filled 2014-06-25: qty 2

## 2014-06-25 MED ORDER — NEOSTIGMINE METHYLSULFATE 10 MG/10ML IV SOLN
INTRAVENOUS | Status: DC | PRN
  Administered 2014-06-25: 4 mg via INTRAVENOUS

## 2014-06-25 MED ORDER — MIDAZOLAM HCL 5 MG/5ML IJ SOLN
INTRAMUSCULAR | Status: DC | PRN
  Administered 2014-06-25 (×2): 1 mg via INTRAVENOUS

## 2014-06-25 MED ORDER — ONDANSETRON HCL 4 MG/2ML IJ SOLN
INTRAMUSCULAR | Status: DC | PRN
Start: 2014-06-25 — End: 2014-06-25
  Administered 2014-06-25: 4 mg via INTRAVENOUS

## 2014-06-25 MED ORDER — NEOSTIGMINE METHYLSULFATE 10 MG/10ML IV SOLN
INTRAVENOUS | Status: AC
  Filled 2014-06-25: qty 1

## 2014-06-25 MED ORDER — EPHEDRINE SULFATE 50 MG/ML IJ SOLN
INTRAMUSCULAR | Status: DC | PRN
  Administered 2014-06-25: 5 mg via INTRAVENOUS

## 2014-06-25 MED ORDER — THROMBIN 5000 UNITS EX SOLR
CUTANEOUS | Status: DC | PRN
  Administered 2014-06-25 (×2): 5000 [IU] via TOPICAL

## 2014-06-25 MED ORDER — ROCURONIUM BROMIDE 50 MG/5ML IV SOLN
INTRAVENOUS | Status: AC
  Filled 2014-06-25: qty 1

## 2014-06-25 MED ORDER — PHENOL 1.4 % MT LIQD: 1.0000 | OROMUCOSAL | Status: DC | PRN

## 2014-06-25 MED ORDER — SODIUM CHLORIDE 0.9 % IV SOLN: 250.0000 mL | INTRAVENOUS | Status: DC

## 2014-06-25 MED ORDER — ACETAMINOPHEN 325 MG PO TABS: 650.0000 mg | ORAL_TABLET | ORAL | Status: DC | PRN

## 2014-06-25 MED ORDER — SODIUM CHLORIDE 0.9 % IR SOLN
Status: DC | PRN
  Administered 2014-06-25: 500 mL

## 2014-06-25 MED ORDER — PROPOFOL 10 MG/ML IV BOLUS
INTRAVENOUS | Status: AC
  Filled 2014-06-25: qty 20

## 2014-06-25 MED ORDER — SODIUM CHLORIDE 0.9 % IJ SOLN: 3.0000 mL | INTRAMUSCULAR | Status: DC | PRN

## 2014-06-25 MED ORDER — FENTANYL CITRATE 0.05 MG/ML IJ SOLN: 25.0000 ug | INTRAMUSCULAR | Status: DC | PRN

## 2014-06-25 MED ORDER — PROPOFOL 10 MG/ML IV BOLUS
INTRAVENOUS | Status: DC | PRN
  Administered 2014-06-25: 120 mg via INTRAVENOUS

## 2014-06-25 MED ORDER — ARTIFICIAL TEARS OP OINT
TOPICAL_OINTMENT | OPHTHALMIC | Status: DC | PRN
  Administered 2014-06-25: 1 via OPHTHALMIC

## 2014-06-25 MED ORDER — DEXAMETHASONE SODIUM PHOSPHATE 10 MG/ML IJ SOLN
INTRAMUSCULAR | Status: DC | PRN
  Administered 2014-06-25: 10 mg via INTRAVENOUS

## 2014-06-25 MED ORDER — LIDOCAINE HCL (CARDIAC) 20 MG/ML IV SOLN
INTRAVENOUS | Status: DC | PRN
  Administered 2014-06-25: 100 mg via INTRAVENOUS

## 2014-06-25 MED ORDER — HEMOSTATIC AGENTS (NO CHARGE) OPTIME
TOPICAL | Status: DC | PRN
  Administered 2014-06-25: 1 via TOPICAL

## 2014-06-25 MED ORDER — SCOPOLAMINE 1 MG/3DAYS TD PT72
MEDICATED_PATCH | TRANSDERMAL | Status: DC | PRN
  Administered 2014-06-25: 1 via TRANSDERMAL

## 2014-06-25 MED ORDER — GLYCOPYRROLATE 0.2 MG/ML IJ SOLN
INTRAMUSCULAR | Status: DC | PRN
  Administered 2014-06-25: 0.6 mg via INTRAVENOUS

## 2014-06-25 MED ORDER — 0.9 % SODIUM CHLORIDE (POUR BTL) OPTIME
TOPICAL | Status: DC | PRN
  Administered 2014-06-25: 1000 mL

## 2014-06-25 MED ORDER — SUCCINYLCHOLINE CHLORIDE 20 MG/ML IJ SOLN
INTRAMUSCULAR | Status: AC
  Filled 2014-06-25: qty 1

## 2014-06-25 MED ORDER — ROCURONIUM BROMIDE 100 MG/10ML IV SOLN
INTRAVENOUS | Status: DC | PRN
  Administered 2014-06-25: 40 mg via INTRAVENOUS

## 2014-06-25 MED ORDER — SCOPOLAMINE 1 MG/3DAYS TD PT72
MEDICATED_PATCH | TRANSDERMAL | Status: AC
  Filled 2014-06-25: qty 1

## 2014-06-25 MED ORDER — MENTHOL 3 MG MT LOZG: 1.0000 | LOZENGE | OROMUCOSAL | Status: DC | PRN

## 2014-06-25 MED ORDER — LIDOCAINE HCL (CARDIAC) 20 MG/ML IV SOLN
INTRAVENOUS | Status: AC
  Filled 2014-06-25: qty 5

## 2014-06-25 MED ORDER — SODIUM CHLORIDE 0.9 % IJ SOLN
3.0000 mL | Freq: Two times a day (BID) | INTRAMUSCULAR | Status: DC
  Administered 2014-06-25: 3 mL via INTRAVENOUS

## 2014-06-25 MED ORDER — CEFAZOLIN SODIUM-DEXTROSE 2-3 GM-% IV SOLR
INTRAVENOUS | Status: DC | PRN
  Administered 2014-06-25: 2 g via INTRAVENOUS

## 2014-06-25 MED ORDER — LACTATED RINGERS IV SOLN
INTRAVENOUS | Status: DC | PRN
  Administered 2014-06-25 (×2): via INTRAVENOUS

## 2014-06-25 MED ORDER — CEFAZOLIN SODIUM 1-5 GM-% IV SOLN
1.0000 g | Freq: Three times a day (TID) | INTRAVENOUS | Status: AC
  Administered 2014-06-25 (×2): 1 g via INTRAVENOUS
  Filled 2014-06-25 (×2): qty 50

## 2014-06-25 MED ORDER — ONDANSETRON HCL 4 MG/2ML IJ SOLN
INTRAMUSCULAR | Status: AC
  Filled 2014-06-25: qty 2

## 2014-06-25 MED ORDER — PROMETHAZINE HCL 25 MG/ML IJ SOLN: 6.2500 mg | INTRAMUSCULAR | Status: DC | PRN

## 2014-06-25 MED ORDER — ONDANSETRON HCL 4 MG/2ML IJ SOLN
4.0000 mg | INTRAMUSCULAR | Status: DC | PRN
  Administered 2014-06-25: 4 mg via INTRAVENOUS
  Filled 2014-06-25: qty 2

## 2014-06-25 MED ORDER — ACETAMINOPHEN 650 MG RE SUPP: 650.0000 mg | RECTAL | Status: DC | PRN

## 2014-06-25 MED ORDER — FENTANYL CITRATE 0.05 MG/ML IJ SOLN
INTRAMUSCULAR | Status: DC | PRN
  Administered 2014-06-25 (×4): 50 ug via INTRAVENOUS

## 2014-06-25 MED ORDER — LIDOCAINE HCL 4 % MT SOLN
OROMUCOSAL | Status: DC | PRN
  Administered 2014-06-25: 4 mL via TOPICAL

## 2014-06-25 MED ORDER — MEPERIDINE HCL 25 MG/ML IJ SOLN: 6.2500 mg | INTRAMUSCULAR | Status: DC | PRN

## 2014-06-25 MED ORDER — FENTANYL CITRATE 0.05 MG/ML IJ SOLN
INTRAMUSCULAR | Status: AC
  Filled 2014-06-25: qty 5

## 2014-06-25 SURGICAL SUPPLY — 45 items
BAG DECANTER FOR FLEXI CONT (MISCELLANEOUS) ×2 IMPLANT
BENZOIN TINCTURE PRP APPL 2/3 (GAUZE/BANDAGES/DRESSINGS) ×2 IMPLANT
BUR MATCHSTICK NEURO 3.0 LAGG (BURR) ×2 IMPLANT
CANISTER SUCT 3000ML (MISCELLANEOUS) ×2 IMPLANT
CONT SPEC 4OZ CLIKSEAL STRL BL (MISCELLANEOUS) ×2 IMPLANT
DRAPE C-ARM 42X72 X-RAY (DRAPES) ×4 IMPLANT
DRAPE LAPAROTOMY 100X72 PEDS (DRAPES) ×2 IMPLANT
DRAPE MICROSCOPE LEICA (MISCELLANEOUS) IMPLANT
DRAPE POUCH INSTRU U-SHP 10X18 (DRAPES) ×2 IMPLANT
DRSG OPSITE 4X5.5 SM (GAUZE/BANDAGES/DRESSINGS) IMPLANT
DRSG OPSITE POSTOP 3X4 (GAUZE/BANDAGES/DRESSINGS) ×2 IMPLANT
DRSG TELFA 3X8 NADH (GAUZE/BANDAGES/DRESSINGS) IMPLANT
DURAPREP 6ML APPLICATOR 50/CS (WOUND CARE) ×2 IMPLANT
ELECT COATED BLADE 2.86 ST (ELECTRODE) ×2 IMPLANT
ELECT REM PT RETURN 9FT ADLT (ELECTROSURGICAL) ×2
ELECTRODE REM PT RTRN 9FT ADLT (ELECTROSURGICAL) ×1 IMPLANT
GAUZE SPONGE 4X4 16PLY XRAY LF (GAUZE/BANDAGES/DRESSINGS) IMPLANT
GLOVE BIO SURGEON STRL SZ8 (GLOVE) ×2 IMPLANT
GLOVE BIOGEL PI IND STRL 7.5 (GLOVE) ×1 IMPLANT
GLOVE BIOGEL PI INDICATOR 7.5 (GLOVE) ×1
GLOVE SURG SS PI 7.0 STRL IVOR (GLOVE) ×4 IMPLANT
GOWN STRL REUS W/ TWL LRG LVL3 (GOWN DISPOSABLE) IMPLANT
GOWN STRL REUS W/ TWL XL LVL3 (GOWN DISPOSABLE) ×2 IMPLANT
GOWN STRL REUS W/TWL 2XL LVL3 (GOWN DISPOSABLE) IMPLANT
GOWN STRL REUS W/TWL LRG LVL3 (GOWN DISPOSABLE)
GOWN STRL REUS W/TWL XL LVL3 (GOWN DISPOSABLE) ×2
HEMOSTAT POWDER KIT SURGIFOAM (HEMOSTASIS) ×2 IMPLANT
KIT BASIN OR (CUSTOM PROCEDURE TRAY) ×2 IMPLANT
KIT ROOM TURNOVER OR (KITS) ×2 IMPLANT
NEEDLE HYPO 25X1 1.5 SAFETY (NEEDLE) ×2 IMPLANT
NEEDLE SPNL 20GX3.5 QUINCKE YW (NEEDLE) ×2 IMPLANT
NS IRRIG 1000ML POUR BTL (IV SOLUTION) ×2 IMPLANT
PACK LAMINECTOMY NEURO (CUSTOM PROCEDURE TRAY) ×2 IMPLANT
PAD ARMBOARD 7.5X6 YLW CONV (MISCELLANEOUS) ×6 IMPLANT
RUBBERBAND STERILE (MISCELLANEOUS) IMPLANT
SCREW FIXED SELF TAP 4.5X15 (Screw) ×4 IMPLANT
SPONGE INTESTINAL PEANUT (DISPOSABLE) ×2 IMPLANT
SPONGE SURGIFOAM ABS GEL SZ50 (HEMOSTASIS) ×2 IMPLANT
STRIP CLOSURE SKIN 1/2X4 (GAUZE/BANDAGES/DRESSINGS) ×2 IMPLANT
SUT VIC AB 3-0 SH 8-18 (SUTURE) ×4 IMPLANT
SYR 20ML ECCENTRIC (SYRINGE) ×2 IMPLANT
TOWEL OR 17X24 6PK STRL BLUE (TOWEL DISPOSABLE) ×2 IMPLANT
TOWEL OR 17X26 10 PK STRL BLUE (TOWEL DISPOSABLE) ×2 IMPLANT
TRAP SPECIMEN MUCOUS 40CC (MISCELLANEOUS) ×2 IMPLANT
WATER STERILE IRR 1000ML POUR (IV SOLUTION) ×2 IMPLANT

## 2014-06-25 NOTE — Progress Notes (Signed)
Report to Marquis Buggy RN

## 2014-06-25 NOTE — Anesthesia Postprocedure Evaluation (Signed)
  Anesthesia Post-op Note  Patient: Sherry Bender  Procedure(s) Performed: Procedure(s): EXPLORATION OF CERVICAL FUSION WITH REVIOSN OF HARDWARE (N/A)  Patient Location: PACU  Anesthesia Type:General  Level of Consciousness: awake and alert   Airway and Oxygen Therapy: Patient Spontanous Breathing and Patient connected to nasal cannula oxygen  Post-op Pain: mild  Post-op Assessment: Post-op Vital signs reviewed, Patient's Cardiovascular Status Stable, Respiratory Function Stable, Patent Airway and No signs of Nausea or vomiting  Post-op Vital Signs: Reviewed and stable  Last Vitals:  Filed Vitals:   06/25/14 0905  BP:   Pulse:   Temp: 36.1 C  Resp:     Complications: No apparent anesthesia complications

## 2014-06-25 NOTE — Transfer of Care (Signed)
Immediate Anesthesia Transfer of Care Note  Patient: Sherry Bender  Procedure(s) Performed: Procedure(s): EXPLORATION OF CERVICAL FUSION WITH REVIOSN OF HARDWARE (N/A)  Patient Location: PACU  Anesthesia Type:General  Level of Consciousness: awake and alert   Airway & Oxygen Therapy: Patient Spontanous Breathing and Patient connected to nasal cannula oxygen  Post-op Assessment: Report given to PACU RN, Post -op Vital signs reviewed and stable and Patient moving all extremities  Post vital signs: Reviewed and stable  Complications: No apparent anesthesia complications

## 2014-06-25 NOTE — Progress Notes (Signed)
Utilization review completed.  

## 2014-06-25 NOTE — Anesthesia Procedure Notes (Signed)
Procedure Name: Intubation Date/Time: 06/25/2014 7:47 AM Performed by: Suzy Bouchard Pre-anesthesia Checklist: Patient identified, Emergency Drugs available, Suction available, Patient being monitored and Timeout performed Patient Re-evaluated:Patient Re-evaluated prior to inductionOxygen Delivery Method: Circle system utilized Preoxygenation: Pre-oxygenation with 100% oxygen Intubation Type: IV induction Ventilation: Mask ventilation without difficulty Tube type: Oral Tube size: 7.0 mm Number of attempts: 1 Airway Equipment and Method: LTA kit utilized and Video-laryngoscopy Placement Confirmation: ETT inserted through vocal cords under direct vision,  breath sounds checked- equal and bilateral and positive ETCO2 Secured at: 22 cm Tube secured with: Tape Dental Injury: Teeth and Oropharynx as per pre-operative assessment  Comments: Neck neutral for induction/intubation

## 2014-06-25 NOTE — Progress Notes (Signed)
Orthopedic Tech Progress Note Patient Details:  Sherry Bender 1950-09-07 [id number] Soft collar applied Ortho Devices Type of Ortho Device: Soft collar Ortho Device/Splint Interventions: Application   [name] 06/25/2014, 12:01 PM

## 2014-06-25 NOTE — Op Note (Signed)
06/24/2014 - 06/25/2014  8:43 AM  PATIENT:  Sherry Bender  64 y.o. female  PRE-OPERATIVE DIAGNOSIS:  Hardware failure with failure of locking mechanism allowing backout of C7 screws  POST-OPERATIVE DIAGNOSIS:  Same  PROCEDURE:  Cervical reexploration with exploration of fusion, removal of C7 screws with replacement with 15 mm rescue screws  SURGEON:  Sherley Bounds, MD  ASSISTANTS: None  ANESTHESIA:   General  EBL: Minimal ml  Total I/O In: 1000 [I.V.:1000] Out: 150 [Urine:150]  BLOOD ADMINISTERED:none  DRAINS: None   SPECIMEN:  No Specimen  INDICATION FOR PROCEDURE: This patient underwent 3 level ACDF with plating last week. She complained of difficulty swallowing and plain films showed that her C7 screws had started to back out suggesting failure of the locking mechanism of the plate. The grafts remained in good position and the plate remained in good position. She was admitted for revision of hardware. Patient understood the risks, benefits, and alternatives and potential outcomes and wished to proceed.  PROCEDURE DETAILS: The patient was taken to the operating room and after induction of adequate generalized endotracheal anesthesia she was placed in the supine position on the operating table. Her right anterior cervical region was prepped with DuraPrep and then draped in the usual sterile fashion. Her old incision was opened and the platysmas was opened. Blunt dissection to identify the anterior cervical spine and anterior cervical plate. A self-retaining retractor was placed and the C7 screws were removed. The plate was flush against the spine and was tight in all of the vertebral bodies. The grafts were in good position and tight within the interspaces. I used two 15 millimeter rescue screws and these appeared to lock into the plate nicely with an audible click and the ring of the locking mechanism seemed to cover the flange of the screws fully and the screws tightened down normally.  Intraoperative fluoroscopy showed the screws to be in good position and they appeared to be bicortical. I irrigated with saline solution containing bacitracin. I tried any bleeding. I then closed the platysma with 3-0 Vicryl. I closed the subcutaneous tissue with 0 Vicryl. The skin was closed with benzoin and Steri-Strips. Sterile dressing was applied and the patient was awakened from general anesthesia and transferred to the recovery room in stable condition. At the end of the procedure all sponge needle and instrument counts were correct.  PLAN OF CARE: Admit to inpatient   PATIENT DISPOSITION:  PACU - hemodynamically stable.   Delay start of Pharmacological VTE agent (>24hrs) due to surgical blood loss or risk of bleeding:  yes

## 2014-06-26 NOTE — Discharge Summary (Signed)
Physician Discharge Summary  Patient ID: Rose-Marie Hickling MRN: [id number] DOB/AGE: 1950-06-23 64 y.o.  Admit date: 06/24/2014 Discharge date: 06/26/2014  Admission Diagnoses: Hardware failure   Discharge Diagnoses: Same   Discharged Condition: good  Hospital Course: The patient was admitted on 06/24/2014 and taken to the operating room where the patient underwent revision of anterior cervical hardware and exploration of fusion. The patient tolerated the procedure well and was taken to the recovery room and then to the floor in stable condition. The hospital course was routine. There were no complications. The wound remained clean dry and intact. Pt had appropriate neck soreness. No complaints of arm pain or new N/T/W. The patient remained afebrile with stable vital signs, and tolerated a regular diet. The patient continued to increase activities, and pain was well controlled with oral pain medications.   Consults: None  Significant Diagnostic Studies:  Results for orders placed or performed during the hospital encounter of 06/20/14  CBC  Result Value Ref Range   WBC 7.4 4.0 - 10.5 K/uL   RBC 3.50 (L) 3.87 - 5.11 MIL/uL   Hemoglobin 10.4 (L) 12.0 - 15.0 g/dL   HCT 31.5 (L) 36.0 - 46.0 %   MCV 90.0 78.0 - 100.0 fL   MCH 29.7 26.0 - 34.0 pg   MCHC 33.0 30.0 - 36.0 g/dL   RDW 13.0 11.5 - 15.5 %   Platelets 233 150 - 400 K/uL  Basic metabolic panel  Result Value Ref Range   Sodium 133 (L) 135 - 145 mmol/L   Potassium 3.4 (L) 3.5 - 5.1 mmol/L   Chloride 97 96 - 112 mEq/L   CO2 22 19 - 32 mmol/L   Glucose, Bld 118 (H) 70 - 99 mg/dL   BUN 8 6 - 23 mg/dL   Creatinine, Ser 0.37 (L) 0.50 - 1.10 mg/dL   Calcium 9.1 8.4 - 10.5 mg/dL   GFR calc non Af Amer >90 >90 mL/min   GFR calc Af Amer >90 >90 mL/min   Anion gap 14 5 - 15  BNP (order ONLY if patient complains of dyspnea/SOB AND you have documented it for THIS visit)  Result Value Ref Range   B Natriuretic Peptide 99.2 0.0 - 100.0 pg/mL   I-stat troponin, ED (not at [facility])  Result Value Ref Range   Troponin i, poc 0.00 0.00 - 0.08 ng/mL   Comment 3          I-stat troponin, ED  Result Value Ref Range   Troponin i, poc 0.01 0.00 - 0.08 ng/mL   Comment 3            Dg Chest 2 View  06/20/2014   CLINICAL DATA:  Chest pain, cough, shortness of breath today. ACDF surgery on Thursday and release yesterday.  EXAM: CHEST  2 VIEW  COMPARISON:  11/17/2013  FINDINGS: Pulmonary hyperinflation. Normal heart size and pulmonary vascularity. No focal airspace disease or consolidation in the lungs. No blunting of costophrenic angles. No pneumothorax. Mediastinal contours appear intact. Mild thoracic scoliosis convex towards the right. Postoperative changes in the lower cervical spine. Surgical clips in the right upper quadrant. Degenerative changes in the thoracic spine.  IMPRESSION: No active cardiopulmonary disease.   Electronically Signed   By: Lucienne Capers M.D.   On: 06/20/2014 22:32   Dg Cervical Spine 1 View  06/25/2014   CLINICAL DATA:  Exploration and revision of cervical hardware, replacement of 2 screws due to pain  EXAM: DG C-ARM 61-120 MIN; DG  CERVICAL SPINE - 1 VIEW  : COMPARISON:  CT 06/24/2014 and earlier studies  FINDINGS: A single fluoroscopic spot image documents ACDF hardware C4-C7. The C7 screws now project in expected location. No external localizing instruments are visualized.  IMPRESSION: 1. ACDF C4-C7, with revision of C7 screw placement.   Electronically Signed   By: Arne Cleveland M.D.   On: 06/25/2014 09:58   Dg Cervical Spine 1 View  06/18/2014   CLINICAL DATA:  Status post C4-5, C5-6, C6-7 ACDF ; fluoro time report is 11 seconds.  EXAM: DG C-ARM 61-120 MIN; DG CERVICAL SPINE - 1 VIEW  COMPARISON:  None  FINDINGS: A single lateral fluoro spot film reveals the patient to have undergone anterior fusion across the C4-5, C5-6, and C6-7 disc levels. Intradiscal devices are present. The metallic hardware is intact in  positioning is appropriate radiographically. The trachea and esophagus are intubated.  IMPRESSION: The patient is undergone anterior cervical fusion at C4-5, C5-6, and C6-7 without immediate postprocedure complication.   Electronically Signed   By: Idona Stach  [place]   On: 06/18/2014 14:25   Ct Angio Chest Pe W/cm &/or Wo Cm  06/20/2014   CLINICAL DATA:  Chest pain and shortness of breath. Left arm pain. Cervical fusion on December 31st.  EXAM: CT ANGIOGRAPHY CHEST WITH CONTRAST  TECHNIQUE: Multidetector CT imaging of the chest was performed using the standard protocol during bolus administration of intravenous contrast. Multiplanar CT image reconstructions and MIPs were obtained to evaluate the vascular anatomy.  CONTRAST:  6mL OMNIPAQUE IOHEXOL 350 MG/ML SOLN  COMPARISON:  None.  FINDINGS: Technically adequate study with good opacification of the central and segmental pulmonary arteries. No focal filling defects demonstrated. No evidence of significant pulmonary embolus.  Normal heart size. Normal caliber thoracic aorta. No evidence of aortic dissection. Great vessel origins are patent. Soft tissue gas collections in the base of the neck likely represent postoperative changes arising from recent cervical spine surgery. No discrete fluid collection is identified. The small calcified lymph nodes in the right hilum and mediastinum consistent with postinflammatory changes. Calcified granulomas in the lungs. No significant lymphadenopathy in the chest. Esophagus is decompressed. Small amount of mucoid material in the trachea. Airways are otherwise patent.  Mild atelectasis in the lung bases. No focal airspace disease or consolidation. No pneumothorax. No blunting of costophrenic angles. Degenerative changes in the spine. Postoperative changes in lower cervical spine. No destructive bone lesions appreciated.  Visualized portions of the upper abdominal organs are grossly unremarkable.  Review of the MIP images confirms the  above findings.  IMPRESSION: No evidence of significant pulmonary embolus. Atelectasis in the lung bases. No evidence of active pulmonary disease. Soft tissue gas in the base of the neck is likely postoperative.   Electronically Signed   By: Lucienne Capers M.D.   On: 06/20/2014 22:57   Ct Cervical Spine Wo Contrast  06/24/2014   CLINICAL DATA:  Anterior cervical hardware failure, 3 intervertebral level ACDF with plating last week. Right neck pain.  EXAM: CT CERVICAL SPINE WITHOUT CONTRAST  TECHNIQUE: Multidetector CT imaging of the cervical spine was performed without intravenous contrast. Multiplanar CT image reconstructions were also generated.  COMPARISON:  Multiple exams, including 06/24/2014 and 03/16/2009  FINDINGS: Anterior plate and screw fixator observed at C4-C5-C6-C7 with interbody graft material well positioned at each level, and shaved endplate margins of the operative levels. The degree of edema tracking along the right side of the neck, around the right thyroid gland and adjacent deep  spaces of the neck, is consistent with the patient's surgery last week. I do not see any abnormal gas in the soft tissues nor is there a obvious loculated abnormal fluid collection to suggest abscess.  As noted on the conventional radiographs, the C7 vertebral screws has backed out. The right screw has backed out 7 mm and the left screw has backed out 3 mm. This indicates failure of the locking mechanism of the plate. The lucent tracks from the original screw placement are visible. The rest of the screws remain in position.  As expected there is still a small amount of residual prevertebral edema.  IMPRESSION: 1. The C7 screws have backed out indicating a failure of the locking mechanism of the plate. The right screws backed out 7 mm in the left screw has backed out 3 mm. 2. The amount of edema tracking in the right-sided neck appears consistent with the patient' s surgery last week, and is not considered indicative  of a specific complication.   Electronically Signed   By: Sherryl Barters M.D.   On: 06/24/2014 18:36   Dg C-arm 1-60 Min  06/25/2014   CLINICAL DATA:  Exploration and revision of cervical hardware, replacement of 2 screws due to pain  EXAM: DG C-ARM 61-120 MIN; DG CERVICAL SPINE - 1 VIEW  : COMPARISON:  CT 06/24/2014 and earlier studies  FINDINGS: A single fluoroscopic spot image documents ACDF hardware C4-C7. The C7 screws now project in expected location. No external localizing instruments are visualized.  IMPRESSION: 1. ACDF C4-C7, with revision of C7 screw placement.   Electronically Signed   By: Arne Cleveland M.D.   On: 06/25/2014 09:58   Dg C-arm 1-60 Min  06/18/2014   CLINICAL DATA:  Status post C4-5, C5-6, C6-7 ACDF ; fluoro time report is 11 seconds.  EXAM: DG C-ARM 61-120 MIN; DG CERVICAL SPINE - 1 VIEW  COMPARISON:  None  FINDINGS: A single lateral fluoro spot film reveals the patient to have undergone anterior fusion across the C4-5, C5-6, and C6-7 disc levels. Intradiscal devices are present. The metallic hardware is intact in positioning is appropriate radiographically. The trachea and esophagus are intubated.  IMPRESSION: The patient is undergone anterior cervical fusion at C4-5, C5-6, and C6-7 without immediate postprocedure complication.   Electronically Signed   By: Mackinley Cassaday  [place]   On: 06/18/2014 14:25    Antibiotics:  Anti-infectives    Start     Dose/Rate Route Frequency Ordered Stop   06/25/14 1600  ceFAZolin (ANCEF) IVPB 1 g/50 mL premix     1 g100 mL/hr over 30 Minutes Intravenous Every 8 hours 06/25/14 1024 06/25/14 2348   06/25/14 0848  bacitracin 50,000 Units in sodium chloride irrigation 0.9 % 500 mL irrigation  Status:  Discontinued       As needed 06/25/14 0848 06/25/14 0853      Discharge Exam: Blood pressure 155/72, pulse 79, temperature 98.6 F (37 C), temperature source Oral, resp. rate 18, height 5\' 7"  (1.702 m), weight 115 lb 8.3 oz (52.4 kg), last  menstrual period 02/28/1999, SpO2 99 %. Neurologic: Grossly normal Incision clean dry and intact  Discharge Medications:     Medication List    TAKE these medications        AZO BLADDER CONTROL/GO-LESS PO  Take 1 tablet by mouth 2 (two) times daily.     DEXILANT 60 MG capsule  Generic drug:  dexlansoprazole  Take 60 mg by mouth daily.     estradiol 1  MG tablet  Commonly known as:  ESTRACE  Take 1 mg by mouth daily.     hydrochlorothiazide 25 MG tablet  Commonly known as:  HYDRODIURIL  Take 1 tablet (25 mg total) by mouth daily.     methylPREDNISolone 4 MG tablet  Commonly known as:  MEDROL DOSEPAK  follow package directions. Take for 21 days     methylPREDNISolone 4 MG tablet  Commonly known as:  MEDROL DOSEPAK  - Day one- two--- 4 tabs  - Day three-four---3 tab  - Day five-six----2 tabs  - Day seven-eight  1 tab     oxyCODONE-acetaminophen 5-325 MG per tablet  Commonly known as:  PERCOCET/ROXICET  Take 1-2 tablets by mouth every 4 (four) hours as needed for moderate pain.     solifenacin 5 MG tablet  Commonly known as:  VESICARE  Take 2 tablets (10 mg total) by mouth daily.        Disposition: Home   Final Dx: Revision of anterior cervical hardware      Discharge Instructions    Call MD for:  difficulty breathing, headache or visual disturbances    Complete by:  As directed      Call MD for:  persistant nausea and vomiting    Complete by:  As directed      Call MD for:  redness, tenderness, or signs of infection (pain, swelling, redness, odor or green/yellow discharge around incision site)    Complete by:  As directed      Call MD for:  severe uncontrolled pain    Complete by:  As directed      Call MD for:  temperature >100.4    Complete by:  As directed      Diet - low sodium heart healthy    Complete by:  As directed      Discharge instructions    Complete by:  As directed   No strenuous activity, may shower, no heavy lifting, no driving      Increase activity slowly    Complete by:  As directed      Remove dressing in 48 hours    Complete by:  As directed               Signed: Jakyah Bradby S 06/26/2014, 7:44 AM

## 2014-06-26 NOTE — Discharge Instructions (Signed)
Wound Care Keep incision covered and dry for one week.  If you shower prior to then, cover incision with plastic wrap.  You may remove outer bandage after one week and shower.  Do not put any creams, lotions, or ointments on incision. Leave steri-strips on neck.  They will fall off by themselves. Activity Walk each and every day, increasing distance each day. No lifting greater than 5 lbs.  Avoid excessive neck motion. No driving for 2 weeks; may ride as a passenger locally. Wear neck brace at all times except when showering or otherwise instructed. Diet Resume your normal diet.  Return to Work Will be discussed at you follow up appointment. Call Your Doctor If Any of These Occur Redness, drainage, or swelling at the wound.  Temperature greater than 101 degrees. Severe pain not relieved by pain medication. Increased difficulty swallowing.  Incision starts to come apart. Follow Up Appt    Anterior Cervical Diskectomy and Fusion Anterior cervical diskectomy is surgery done on the upper spine to relieve pressure on one or more nerve roots, or on the spinal cord. There are 7 bones in your neck, called the cervical spine. These 7 bones (vertebrae) sit one on top of the other. Cushions (intervertebral disks) separate the vertebrae and act like shock absorbers. As we age, degeneration of our bones, joints, and disks can cause neck pain and tightening around the spinal cord and nerve roots. This causes arm pain and weakness.  Degeneration involves:  Herniated Disk. With age, the disks dry up and can rupture. In this condition, the center of the disk bulges out (disk herniation). This can cause pressure on a nerve, which produces pain or weakness in the arm.  Bone spurs and spinal stenosis. As we age, growths often develop on our bones. These growths are called bone spurs (osteophytes). A bone spur is a collection of calcium. As bone spurs grow and extend, the vertebral openings become narrow.  The spinal canal and/or the foramen (opening for nerve passageways) become smaller. This narrowing (stenosis) may cause pinching (compression) of the spinal cord or the spinal nerve root. The nerve injury can cause pain, weakness, numbness, and loss of coordination in the upper limbs. Often, patients have difficulty with their hand writing or they start dropping things, because their hand grip is weaker. The spinal cord damage can cause increased stiffness, more frequent falls, electric shooting pain, and changes in bowel and bladder control. Degeneration in the neck results in three common problems:  Radiculopathy - Nerve compression that results in weakness or pain that radiates down the arm.  Myelopathy - Spinal cord compression that causes stiffness, difficulty with walking, coordination, and trouble with bowel or bladder habits.  Neck pain - Worn out joints cause pain as the neck moves. Treatment:  Radiculopathy - Surgery is performed to remove the bony and disk material that is pushing on the nerve.  Myelopathy - Surgery is performed to remove the bony and disk material that pushes on the spinal cord.  Neck pain - Surgery is performed to combine (fuse) the joints of the neck together, so they cannot move or cause pain. Surgery can be done from the front or the back of the neck. When it is done from the front, it is called an anterior (front) cervical (neck) diskectomy (removal of the disk) and fusion. LET YOUR CAREGIVER KNOW ABOUT:   Recent infections.  Any shooting pains down your leg, when you move your neck.  Any difficulty swallowing.  A  smoking history.  Use of blood thinners or anti-inflammatory medicines.  Any history of injury to your shoulders.  Any history of injury to your vocal cords.  Any foreign objects in your body from a previous surgery.  Any recent fevers or illness.  Past medical history (diabetes, strokes).  Past problems with  anesthetics.  Possibility of pregnancy.  History of blood clots (deep vein thrombosis).  History of bleeding or blood problems.  Past surgeries.  Other health problems.  Allergies.  Medicines you take, including herbs, eye drops, over-the-counter medicines, and creams.  Use of steroids (by mouth or creams). RISKS AND COMPLICATIONS  Infection.  Bleeding.  Injury to the following structures:  Carotid artery. This can result in a stroke or significant amount of bleeding.  Esophagus, resulting in difficulty swallowing.  Recurrent laryngeal nerve, resulting in hoarseness of the voice.  Spinal cord injury, ranging from mild to complete quadriparesis (muscle weakness in all four limbs).  Nerve root injury, resulting in muscle weakness in the upper limb.  Leakage of cerebrospinal fluid. BEFORE THE PROCEDURE   You will be given medicine to help you sleep (general anesthetic), and a breathing tube will be placed.  You will be given antibiotics to keep the infection rate down.  The incision site on your neck will be marked.  Your neck will be cleaned, to reduce the risk of infection. PROCEDURE  An anterior cervical fusion means that the operation is done through the front (anterior) part of your neck. The cut made by the surgeon (incision) is usually within a skin fold line on the neck. After pushing aside the neck muscles, the surgeon removes the affected, degenerated disk and bone spurs (osteophytes), which takes the pressure off the nerves and spinal cord. This is called a decompression. The area where the disk was removed is then filled with a small piece of plastic. This plastic takes the place of the disk and keeps the nerve passageway (foramen) open and clear for the nerves. In most cases, the surgeon uses metal plates or pins (hardware) in the neck, to help stabilize the level being fused. The hardware reduces motion at that level, so it can fuse. This provides extra  support to the neck. A cervical fusion procedure takes anywhere from a couple to several hours, depending on the size of the neck, history of previous surgery, and number of levels being fused. AFTER THE PROCEDURE   You will likely spend 24-48 hours in the hospital. During this time, your caregivers will look for any signs of complications from the procedure.  Your caregiver will watch you, to make sure that fluid draining from the surgery slows down. It is important that a large mass of blood does not form in your neck, which would cause difficulty with breathing.  You will get 24 hours of antibiotics.  You can start to eat as soon as you feel comfortable.  Once you have started eating, walking, urinating (voiding) and having bowel movements on your own, your caregiver will discharge you home. HOME CARE INSTRUCTIONS   For 2 weeks, do not soak the incision site under water. Do not swim or take baths. Showers are okay, but rinse off the incision sites.  Do not over exert yourself. Allow time for the incision to heal.  It can take from 6 weeks to 6 months for fusion to take effect. Your caregiver may ask you to wear a neck collar during this time, as they check the fusion with multiple (serial)  X-rays. Document Released: 05/24/2009 Document Revised: 09/30/2012 Document Reviewed: 05/24/2009 [facility] Patient Information 2015 [place], [place]. This information is not intended to replace advice given to you by your health care provider. Make sure you discuss any questions you have with your health care provider.  Call today for appointment in 1-2 weeks ([phone]) or for problems.  If you have any hardware placed in your spine, you will need an x-ray before your appointment.

## 2014-06-26 NOTE — Progress Notes (Signed)
Pt doing well. Pt and husband given D/C instructions with verbal understanding. Pt's incision is clean and dry with no sign of infection. Pt's IV was removed prior to D/C. Pt D/C'd home via wheelchair @ 1020 per MD order. Pt is stable @ D/C and has no other needs at this time. Holli Humbles, RN

## 2014-06-29 ENCOUNTER — Encounter (HOSPITAL_COMMUNITY): Payer: Self-pay | Admitting: Neurological Surgery

## 2014-10-06 ENCOUNTER — Other Ambulatory Visit (HOSPITAL_COMMUNITY): Payer: Self-pay | Admitting: Internal Medicine

## 2014-10-06 DIAGNOSIS — Z1231 Encounter for screening mammogram for malignant neoplasm of breast: Secondary | ICD-10-CM

## 2014-10-13 ENCOUNTER — Ambulatory Visit (HOSPITAL_COMMUNITY)
Admission: RE | Admit: 2014-10-13 | Discharge: 2014-10-13 | Disposition: A | Discharge status: Home / Self Care | Payer: Managed Care, Other (non HMO) | Source: Ambulatory Visit | Attending: Internal Medicine | Admitting: Internal Medicine

## 2014-10-13 DIAGNOSIS — Z1231 Encounter for screening mammogram for malignant neoplasm of breast: Secondary | ICD-10-CM

## 2014-10-19 ENCOUNTER — Other Ambulatory Visit: Payer: Self-pay | Admitting: Family Medicine

## 2014-10-19 DIAGNOSIS — N63 Unspecified lump in unspecified breast: Secondary | ICD-10-CM

## 2014-11-02 ENCOUNTER — Other Ambulatory Visit: Payer: Self-pay | Admitting: Family Medicine

## 2014-11-02 ENCOUNTER — Ambulatory Visit
Admission: RE | Admit: 2014-11-02 | Discharge: 2014-11-02 | Disposition: A | Discharge status: Home / Self Care | Payer: Managed Care, Other (non HMO) | Source: Ambulatory Visit | Attending: Family Medicine | Admitting: Family Medicine

## 2014-11-02 DIAGNOSIS — N63 Unspecified lump in unspecified breast: Secondary | ICD-10-CM

## 2014-11-02 DIAGNOSIS — N6320 Unspecified lump in the left breast, unspecified quadrant: Secondary | ICD-10-CM

## 2014-11-05 ENCOUNTER — Other Ambulatory Visit: Payer: Self-pay | Admitting: Family Medicine

## 2014-11-05 DIAGNOSIS — N6320 Unspecified lump in the left breast, unspecified quadrant: Secondary | ICD-10-CM

## 2014-11-09 ENCOUNTER — Ambulatory Visit
Admission: RE | Admit: 2014-11-09 | Discharge: 2014-11-09 | Disposition: A | Discharge status: Home / Self Care | Payer: Managed Care, Other (non HMO) | Source: Ambulatory Visit | Attending: Family Medicine | Admitting: Family Medicine

## 2014-11-09 DIAGNOSIS — N6320 Unspecified lump in the left breast, unspecified quadrant: Secondary | ICD-10-CM

## 2014-11-10 ENCOUNTER — Telehealth: Payer: Self-pay | Admitting: *Deleted

## 2014-11-10 NOTE — Telephone Encounter (Signed)
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 This is just and FYI. It looks like she has only ever seen [facility].

## 2014-11-11 ENCOUNTER — Telehealth: Payer: Self-pay | Admitting: *Deleted

## 2014-11-11 DIAGNOSIS — C50412 Malignant neoplasm of upper-outer quadrant of left female breast: Secondary | ICD-10-CM | POA: Insufficient documentation

## 2014-11-11 NOTE — Telephone Encounter (Signed)
Confirmed [facility] for 11/18/14 at 8am .  Instructions and contact information given.

## 2014-11-12 ENCOUNTER — Encounter: Payer: Self-pay | Admitting: Family Medicine

## 2014-11-12 ENCOUNTER — Ambulatory Visit (INDEPENDENT_AMBULATORY_CARE_PROVIDER_SITE_OTHER): Payer: Managed Care, Other (non HMO) | Admitting: Family Medicine

## 2014-11-12 VITALS — BP 159/87 | HR 101 | Temp 97.4°F | Ht 67.0 in | Wt 120.0 lb

## 2014-11-12 DIAGNOSIS — K219 Gastro-esophageal reflux disease without esophagitis: Secondary | ICD-10-CM | POA: Diagnosis not present

## 2014-11-12 DIAGNOSIS — N3010 Interstitial cystitis (chronic) without hematuria: Secondary | ICD-10-CM | POA: Diagnosis not present

## 2014-11-12 DIAGNOSIS — C50912 Malignant neoplasm of unspecified site of left female breast: Secondary | ICD-10-CM | POA: Diagnosis not present

## 2014-11-12 DIAGNOSIS — I10 Essential (primary) hypertension: Secondary | ICD-10-CM

## 2014-11-12 MED ORDER — MIRABEGRON ER 50 MG PO TB24: 50.0000 mg | ORAL_TABLET | Freq: Every day | ORAL | Status: DC

## 2014-11-12 NOTE — Patient Instructions (Signed)
DASH Eating Plan °DASH stands for "Dietary Approaches to Stop Hypertension." The DASH eating plan is a healthy eating plan that has been shown to reduce high blood pressure (hypertension). Additional health benefits may include reducing the risk of type 2 diabetes mellitus, heart disease, and stroke. The DASH eating plan may also help with weight loss. °WHAT DO I NEED TO KNOW ABOUT THE DASH EATING PLAN? °For the DASH eating plan, you will follow these general guidelines: °· Choose foods with a percent daily value for sodium of less than 5% (as listed on the food label). °· Use salt-free seasonings or herbs instead of table salt or sea salt. °· Check with your health care provider or pharmacist before using salt substitutes. °· Eat lower-sodium products, often labeled as "lower sodium" or "no salt added." °· Eat fresh foods. °· Eat more vegetables, fruits, and low-fat dairy products. °· Choose whole grains. Look for the word "whole" as the first word in the ingredient list. °· Choose fish and skinless chicken or turkey more often than red meat. Limit fish, poultry, and meat to 6 oz (170 g) each day. °· Limit sweets, desserts, sugars, and sugary drinks. °· Choose heart-healthy fats. °· Limit cheese to 1 oz (28 g) per day. °· Eat more home-cooked food and less restaurant, buffet, and fast food. °· Limit fried foods. °· Cook foods using methods other than frying. °· Limit canned vegetables. If you do use them, rinse them well to decrease the sodium. °· When eating at a restaurant, ask that your food be prepared with less salt, or no salt if possible. °WHAT FOODS CAN I EAT? °Seek help from a dietitian for individual calorie needs. °Grains °Whole grain or whole wheat bread. Brown rice. Whole grain or whole wheat pasta. Quinoa, bulgur, and whole grain cereals. Low-sodium cereals. Corn or whole wheat flour tortillas. Whole grain cornbread. Whole grain crackers. Low-sodium crackers. °Vegetables °Fresh or frozen vegetables  (raw, steamed, roasted, or grilled). Low-sodium or reduced-sodium tomato and vegetable juices. Low-sodium or reduced-sodium tomato sauce and paste. Low-sodium or reduced-sodium canned vegetables.  °Fruits °All fresh, canned (in natural juice), or frozen fruits. °Meat and Other Protein Products °Ground beef (85% or leaner), grass-fed beef, or beef trimmed of fat. Skinless chicken or turkey. Ground chicken or turkey. Pork trimmed of fat. All fish and seafood. Eggs. Dried beans, peas, or lentils. Unsalted nuts and seeds. Unsalted canned beans. °Dairy °Low-fat dairy products, such as skim or 1% milk, 2% or reduced-fat cheeses, low-fat ricotta or cottage cheese, or plain low-fat yogurt. Low-sodium or reduced-sodium cheeses. °Fats and Oils °Tub margarines without trans fats. Light or reduced-fat mayonnaise and salad dressings (reduced sodium). Avocado. Safflower, olive, or canola oils. Natural peanut or almond butter. °Other °Unsalted popcorn and pretzels. °The items listed above may not be a complete list of recommended foods or beverages. Contact your dietitian for more options. °WHAT FOODS ARE NOT RECOMMENDED? °Grains °White bread. White pasta. White rice. Refined cornbread. Bagels and croissants. Crackers that contain trans fat. °Vegetables °Creamed or fried vegetables. Vegetables in a cheese sauce. Regular canned vegetables. Regular canned tomato sauce and paste. Regular tomato and vegetable juices. °Fruits °Dried fruits. Canned fruit in light or heavy syrup. Fruit juice. °Meat and Other Protein Products °Fatty cuts of meat. Ribs, chicken wings, bacon, sausage, bologna, salami, chitterlings, fatback, hot dogs, bratwurst, and packaged luncheon meats. Salted nuts and seeds. Canned beans with salt. °Dairy °Whole or 2% milk, cream, half-and-half, and cream cheese. Whole-fat or sweetened yogurt. Full-fat   cheeses or blue cheese. Nondairy creamers and whipped toppings. Processed cheese, cheese spreads, or cheese  curds. °Condiments °Onion and garlic salt, seasoned salt, table salt, and sea salt. Canned and packaged gravies. Worcestershire sauce. Tartar sauce. Barbecue sauce. Teriyaki sauce. Soy sauce, including reduced sodium. Steak sauce. Fish sauce. Oyster sauce. Cocktail sauce. Horseradish. Ketchup and mustard. Meat flavorings and tenderizers. Bouillon cubes. Hot sauce. Tabasco sauce. Marinades. Taco seasonings. Relishes. °Fats and Oils °Butter, stick margarine, lard, shortening, ghee, and bacon fat. Coconut, palm kernel, or palm oils. Regular salad dressings. °Other °Pickles and olives. Salted popcorn and pretzels. °The items listed above may not be a complete list of foods and beverages to avoid. Contact your dietitian for more information. °WHERE CAN I FIND MORE INFORMATION? °National [employer], [employer], and [employer] Institute: www.nhlbi.nih.gov/health/health-topics/topics/dash/ °Document Released: 05/25/2011 Document Revised: 10/20/2013 Document Reviewed: 04/09/2013 °ExitCare® Patient Information ©2015 [employer]. This information is not intended to replace advice given to you by your health care provider. Make sure you discuss any questions you have with your health care provider. ° °

## 2014-11-12 NOTE — Progress Notes (Signed)
Subjective:  Patient ID: Sherry Bender, female    DOB: 1950-12-14  Age: 64 y.o. MRN: [id number]  CC: Hypertension; Gastrophageal Reflux; and Breast Cancer   HPI Sherry Bender presents forPatient in for follow-up of GERD. Currently asymptomatic taking  PPI daily. There is no chest pain or heartburn. No hematemesis and no melena. No dysphagia or choking. Onset is remote. Progression is stable. Complicating factors, none.    follow-up of hypertension. Patient has no history of headache chest pain or shortness of breath or recent cough. Patient also denies symptoms of TIA such as numbness weakness lateralizing. Patient checks  blood pressure at home and has not had any elevated readings recently. Patient denies side effects from his medication. States taking it regularly.   Patient was diagnosed with left sided breast cancer 2 days ago. She has multiple questions regarding the cancer. She has been told that it will be explained more detail to her and a plan of action when she has an appointment with oncology next week. She was told to expect left-sided mastectomy   History Sherry Bender has a past medical history of Hyperlipidemia; Hypertension; Complication of anesthesia; PONV (postoperative nausea and vomiting); GERD (gastroesophageal reflux disease); Headache; Arthritis; Scoliosis; and Cancer.   She has past surgical history that includes Cholecystectomy (1994); Abdominal hysterectomy (2000); Knee arthroscopy (2005); Foot neuroma surgery (01/2010); Rhinoplasty; Cervical fusion; Anterior cervical decomp/discectomy fusion (N/A, 06/18/2014); and Anterior cervical decomp/discectomy fusion (N/A, 06/25/2014).   Her family history includes Cancer in her maternal grandmother and paternal aunt; Diabetes in her mother; Heart disease in her father and mother; Hypertension in her father and mother.She reports that she has been smoking Cigarettes.  She has a 21 pack-year smoking history. She does not have any  smokeless tobacco history on file. She reports that she does not drink alcohol or use illicit drugs.  Outpatient Prescriptions Prior to Visit  Medication Sig Dispense Refill  . estradiol (ESTRACE) 1 MG tablet Take 1 mg by mouth daily.      . hydrochlorothiazide (HYDRODIURIL) 25 MG tablet Take 1 tablet (25 mg total) by mouth daily. 90 tablet 0  . Pumpkin Seed-Soy Germ (AZO BLADDER CONTROL/GO-LESS PO) Take 1 tablet by mouth 2 (two) times daily.    Marland Kitchen DEXILANT 60 MG capsule Take 60 mg by mouth daily.  4  . methylPREDNISolone (MEDROL DOSEPAK) 4 MG tablet Day one- two--- 4 tabs Day three-four---3 tab Day five-six----2 tabs Day seven-eight  1 tab (Patient not taking: Reported on 11/12/2014) 20 tablet 0  . oxyCODONE-acetaminophen (PERCOCET/ROXICET) 5-325 MG per tablet Take 1-2 tablets by mouth every 4 (four) hours as needed for moderate pain. (Patient not taking: Reported on 11/12/2014) 100 tablet 0  . solifenacin (VESICARE) 5 MG tablet Take 2 tablets (10 mg total) by mouth daily. (Patient not taking: Reported on 11/12/2014) 60 tablet 0   No facility-administered medications prior to visit.    ROS Review of Systems  Constitutional: Negative for fever, chills, diaphoresis, appetite change, fatigue and unexpected weight change.  HENT: Negative for congestion, ear pain, hearing loss, postnasal drip, rhinorrhea, sneezing, sore throat and trouble swallowing.   Eyes: Negative for pain.  Respiratory: Negative for cough, chest tightness and shortness of breath.   Cardiovascular: Negative for chest pain and palpitations.  Gastrointestinal: Negative for nausea, vomiting, abdominal pain, diarrhea and constipation.  Genitourinary: Positive for urgency (with urge incontinence), frequency and pelvic pain. Negative for dysuria and menstrual problem.  Musculoskeletal: Negative for joint swelling and arthralgias.  Skin: Negative  for rash.  Neurological: Negative for dizziness, weakness, numbness and headaches.    Psychiatric/Behavioral: Negative for dysphoric mood and agitation.    Objective:  BP 159/87 mmHg  Pulse 101  Temp(Src) 97.4 F (36.3 C) (Oral)  Ht 5' 7" (1.702 m)  Wt 120 lb (54.432 kg)  BMI 18.79 kg/m2  LMP 02/28/1999  BP Readings from Last 3 Encounters:  11/12/14 159/87  06/26/14 154/69  06/21/14 160/92    Wt Readings from Last 3 Encounters:  11/12/14 120 lb (54.432 kg)  06/24/14 115 lb 8.3 oz (52.4 kg)  06/20/14 127 lb (57.607 kg)     Physical Exam  Constitutional: She is oriented to person, place, and time. She appears well-developed and well-nourished. No distress.  HENT:  Head: Normocephalic and atraumatic.  Right Ear: External ear normal.  Left Ear: External ear normal.  Nose: Nose normal.  Mouth/Throat: Oropharynx is clear and moist.  Eyes: Conjunctivae and EOM are normal. Pupils are equal, round, and reactive to light.  Neck: Normal range of motion. Neck supple. No thyromegaly present.  Cardiovascular: Normal rate, regular rhythm and normal heart sounds.   No murmur heard. Pulmonary/Chest: Effort normal and breath sounds normal. No respiratory distress. She has no wheezes. She has no rales.  Abdominal: Soft. Bowel sounds are normal. She exhibits no distension. There is no tenderness.  Lymphadenopathy:    She has no cervical adenopathy.  Neurological: She is alert and oriented to person, place, and time. She has normal reflexes.  Skin: Skin is warm and dry.  Psychiatric: She has a normal mood and affect. Her behavior is normal. Judgment and thought content normal.    No results found for: HGBA1C  Lab Results  Component Value Date   WBC 7.4 06/20/2014   HGB 10.4* 06/20/2014   HCT 31.5* 06/20/2014   PLT 233 06/20/2014   GLUCOSE 118* 06/20/2014   NA 133* 06/20/2014   K 3.4* 06/20/2014   CL 97 06/20/2014   CREATININE 0.37* 06/20/2014   BUN 8 06/20/2014   CO2 22 06/20/2014    Mm Digital Diagnostic Unilat L  11/09/2014   CLINICAL DATA:  Patient  status post ultrasound-guided core needle biopsy left breast mass.  EXAM: DIAGNOSTIC LEFT MAMMOGRAM POST ULTRASOUND BIOPSY  COMPARISON:  Previous exam(s).  FINDINGS: Mammographic images were obtained following ultrasound guided biopsy of left breast mass, 12 o'clock position. Ribbon shaped marking clip in appropriate position  IMPRESSION: Appropriate position ribbon shaped marking clip status post ultrasound-guided core needle biopsy left breast mass, 12 o'clock position.  Final Assessment: Post Procedure Mammograms for Marker Placement   Electronically Signed   By: Lovey Newcomer M.D.   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 This was found to be concordant by Dr. Margarette Canada. Pathology results were discussed with the patient via telephone. Dr. Redge Gainer (PMD) received a verbal pathology report and stated the patient may decide where to be seen for a surgical consult. The patient reported tenderness at the biopsy site. Post biopsy instructions were reviewed and questions were answered. The patient was encouraged to call [facility] with any additional questions and or concern. The patient was referred to the [place] multi-disciplinary clinic on November 18, 2014.  Pathology results reported by Terie Purser RN  on Nov 10, 2014.   Electronically Signed   By: Lovey Newcomer M.D.   On: 11/10/2014 15:36   11/10/2014   CLINICAL DATA:  Patient with suspicious palpable left breast mass.  EXAM: ULTRASOUND GUIDED LEFT BREAST CORE NEEDLE BIOPSY  COMPARISON:  Previous exam(s).  PROCEDURE: I met with the patient and we discussed the procedure of ultrasound-guided biopsy, including benefits and alternatives. We discussed the high likelihood of a successful procedure. We discussed the risks of the  procedure including infection, bleeding, tissue injury, clip migration, and inadequate sampling. Informed written consent was given. The usual time-out protocol was performed immediately prior to the procedure.  Using sterile technique and 2% Lidocaine as local anesthetic, under direct ultrasound visualization, a 12 gauge vacuum-assisted device was used to perform biopsy of left breast mass 12 o'clock positionusing a medial approach. At the conclusion of the procedure, a ribbon shaped tissue marker clip was deployed into the biopsy cavity. Follow-up 2-view mammogram was performed and dictated separately.  IMPRESSION: Ultrasound-guided biopsy of left breast mass, 12 o'clock position. No apparent complications.  Electronically Signed: By: Lovey Newcomer M.D. On: 11/09/2014 15:56    Assessment & Plan:   Sherry Bender was seen today for hypertension, gastrophageal reflux and breast cancer.  Diagnoses and all orders for this visit:  Essential hypertension  Interstitial cystitis  Breast cancer, left  Gastroesophageal reflux disease without esophagitis  Other orders -     mirabegron ER (MYRBETRIQ) 50 MG TB24 tablet; Take 1 tablet (50 mg total) by mouth daily.  I have discontinued Sherry Bender's solifenacin, oxyCODONE-acetaminophen, and methylPREDNISolone. I am also having her start on mirabegron ER. Additionally, I am having her maintain her estradiol, hydrochlorothiazide, DEXILANT, Pumpkin Seed-Soy Germ (AZO BLADDER CONTROL/GO-LESS PO), celecoxib, and diazepam.  Meds ordered this encounter  Medications  . celecoxib (CELEBREX) 200 MG capsule    Sig: Take 200 mg by mouth daily.     Refill:  3  . diazepam (VALIUM) 5 MG tablet    Sig: Take 5 mg by mouth every 12 (twelve) hours as needed.     Refill:  0  . mirabegron ER (MYRBETRIQ) 50 MG TB24 tablet    Sig: Take 1 tablet (50 mg total) by mouth daily.    Dispense:  30 tablet    Refill:  2     Follow-up: Return in about 3 months (around  02/12/2015).  Claretta Fraise, M.D.

## 2014-11-18 ENCOUNTER — Ambulatory Visit (HOSPITAL_BASED_OUTPATIENT_CLINIC_OR_DEPARTMENT_OTHER): Payer: Managed Care, Other (non HMO) | Admitting: Hematology and Oncology

## 2014-11-18 ENCOUNTER — Telehealth: Payer: Self-pay | Admitting: Hematology and Oncology

## 2014-11-18 ENCOUNTER — Other Ambulatory Visit: Payer: Self-pay | Admitting: General Surgery

## 2014-11-18 ENCOUNTER — Encounter: Payer: Self-pay | Admitting: Hematology and Oncology

## 2014-11-18 ENCOUNTER — Ambulatory Visit
Admission: RE | Admit: 2014-11-18 | Discharge: 2014-11-18 | Disposition: A | Discharge status: Home / Self Care | Payer: Managed Care, Other (non HMO) | Source: Ambulatory Visit | Attending: Radiation Oncology | Admitting: Radiation Oncology

## 2014-11-18 ENCOUNTER — Encounter: Payer: Self-pay | Admitting: Skilled Nursing Facility1

## 2014-11-18 ENCOUNTER — Encounter: Payer: Self-pay | Admitting: Physical Therapy

## 2014-11-18 ENCOUNTER — Other Ambulatory Visit (HOSPITAL_BASED_OUTPATIENT_CLINIC_OR_DEPARTMENT_OTHER): Payer: Managed Care, Other (non HMO)

## 2014-11-18 ENCOUNTER — Telehealth: Payer: Self-pay | Admitting: *Deleted

## 2014-11-18 ENCOUNTER — Encounter: Payer: Self-pay | Admitting: *Deleted

## 2014-11-18 ENCOUNTER — Ambulatory Visit: Payer: Managed Care, Other (non HMO) | Attending: General Surgery | Admitting: Physical Therapy

## 2014-11-18 ENCOUNTER — Encounter: Payer: Managed Care, Other (non HMO) | Admitting: *Deleted

## 2014-11-18 ENCOUNTER — Ambulatory Visit: Payer: Managed Care, Other (non HMO)

## 2014-11-18 VITALS — BP 144/74 | HR 80 | Temp 97.7°F | Resp 18 | Ht 67.0 in | Wt 119.1 lb

## 2014-11-18 DIAGNOSIS — C50412 Malignant neoplasm of upper-outer quadrant of left female breast: Secondary | ICD-10-CM | POA: Insufficient documentation

## 2014-11-18 DIAGNOSIS — Z171 Estrogen receptor negative status [ER-]: Secondary | ICD-10-CM

## 2014-11-18 DIAGNOSIS — R293 Abnormal posture: Secondary | ICD-10-CM | POA: Diagnosis present

## 2014-11-18 LAB — CBC WITH DIFFERENTIAL/PLATELET
BASO%: 0.7 % (ref 0.0–2.0)
Basophils Absolute: 0 10*3/uL (ref 0.0–0.1)
EOS%: 4.4 % (ref 0.0–7.0)
Eosinophils Absolute: 0.2 10*3/uL (ref 0.0–0.5)
HCT: 33.1 % — ABNORMAL LOW (ref 34.8–46.6)
HGB: 10.8 g/dL — ABNORMAL LOW (ref 11.6–15.9)
LYMPH%: 15.3 % (ref 14.0–49.7)
MCH: 26 pg (ref 25.1–34.0)
MCHC: 32.6 g/dL (ref 31.5–36.0)
MCV: 79.6 fL (ref 79.5–101.0)
MONO#: 0.4 10*3/uL (ref 0.1–0.9)
MONO%: 7.6 % (ref 0.0–14.0)
NEUT#: 3.3 10*3/uL (ref 1.5–6.5)
NEUT%: 72 % (ref 38.4–76.8)
Platelets: 203 10*3/uL (ref 145–400)
RBC: 4.16 10*6/uL (ref 3.70–5.45)
RDW: 15 % — ABNORMAL HIGH (ref 11.2–14.5)
WBC: 4.6 10*3/uL (ref 3.9–10.3)
lymph#: 0.7 10*3/uL — ABNORMAL LOW (ref 0.9–3.3)

## 2014-11-18 LAB — COMPREHENSIVE METABOLIC PANEL (CC13)
ALT: 19 U/L (ref 0–55)
AST: 23 U/L (ref 5–34)
Albumin: 3.5 g/dL (ref 3.5–5.0)
Alkaline Phosphatase: 51 U/L (ref 40–150)
Anion Gap: 12 mEq/L — ABNORMAL HIGH (ref 3–11)
BUN: 6.5 mg/dL — ABNORMAL LOW (ref 7.0–26.0)
CO2: 29 mEq/L (ref 22–29)
Calcium: 8.9 mg/dL (ref 8.4–10.4)
Chloride: 96 mEq/L — ABNORMAL LOW (ref 98–109)
Creatinine: 0.5 mg/dL — ABNORMAL LOW (ref 0.6–1.1)
EGFR: >90 mL/min/{1.73_m2} (ref >90)
Glucose: 111 mg/dl (ref 70–140)
Potassium: 3 mEq/L — CL (ref 3.5–5.1)
Sodium: 136 mEq/L (ref 136–145)
Total Bilirubin: 0.23 mg/dL (ref 0.20–1.20)
Total Protein: 6.8 g/dL (ref 6.4–8.3)

## 2014-11-18 MED ORDER — ACETAMINOPHEN 325 MG PO TABS
650.0000 mg | ORAL_TABLET | Freq: Once | ORAL | Status: AC
Start: 2014-11-18 — End: 2014-11-18
  Administered 2014-11-18: 650 mg via ORAL

## 2014-11-18 MED ORDER — ACETAMINOPHEN 325 MG PO TABS
ORAL_TABLET | ORAL | Status: AC
  Filled 2014-11-18: qty 2

## 2014-11-18 NOTE — Telephone Encounter (Signed)
Appointments made and avs printed for patient °

## 2014-11-18 NOTE — Therapy (Signed)
[place], [place], [address] Phone: [phone]   Fax:  [phone]  Physical Therapy Evaluation  Patient Details  Name: Sherry Bender MRN: [id number] Date of Birth: [birth year] Referring Provider:  Fanny Skates, MD  Encounter Date: 11/18/2014      PT End of Session - 11/18/14 1255    Visit Number 1   Number of Visits 1   PT Start Time 1000   PT Stop Time 1020  Also saw pt from 1040-1050   PT Time Calculation (min) 20 min   Activity Tolerance Patient tolerated treatment well   Behavior During Therapy [facility] for tasks assessed/performed      Past Medical History  Diagnosis Date  . Hyperlipidemia   . Hypertension   . Complication of anesthesia     "HARD TO WAKE UP".......Marland KitchenN/V  . PONV (postoperative nausea and vomiting)   . GERD (gastroesophageal reflux disease)   . Headache     FROM THE NECK  . Arthritis     IN BACK....  . Scoliosis   . 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 Cholecystectomy  1994  . Abdominal hysterectomy  2000    TAH/BSO  . Knee arthroscopy  2005    right knee  . Foot neuroma surgery  01/2010    also had metatarasl shortened  . Rhinoplasty    . Cervical fusion    . Anterior cervical decomp/discectomy fusion N/A 06/18/2014    Procedure: C4-5 C5-6 C6-7 ANTERIOR CERVICAL DECOMPRESSION/DISKECTOMY/FUSION;  Surgeon: Eustace Moore, MD;  Location: [facility] [facility];  Service: Neurosurgery;  Laterality: N/A;  C4-5 C5-6 C6-7 ANTERIOR CERVICAL DECOMPRESSION/DISKECTOMY/FUSION  . Anterior cervical decomp/discectomy fusion N/A 06/25/2014    Procedure: EXPLORATION OF CERVICAL FUSION WITH REVIOSN OF HARDWARE;  Surgeon: Eustace Moore, MD;  Location: [place] [facility];  Service: Neurosurgery;  Laterality: N/A;    There were no vitals filed for this visit.  Visit Diagnosis:  Abnormal posture - Plan: PT plan of care cert/re-cert  Carcinoma of  upper-outer quadrant of left female breast - Plan: PT plan of care cert/re-cert      Subjective Assessment - 11/18/14 1239    Subjective Patient was seen today for a baseline assessment of her newly diagnosed left breast cancer.   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  Her mass measures 5.3 cm in size and is located at 12:00 position in the upper outer quadrant.   Patient Stated Goals Reduce lymphedema risk and learn post op shoulder ROM HEP.   Currently in Pain? Yes   Pain Score 8    Pain Location Head   Pain Orientation Anterior;Posterior   Pain Descriptors / Indicators Aching   Pain Type Acute pain   Pain Radiating Towards Neck and arms   Pain Onset Today   Pain Frequency Intermittent   Aggravating Factors  Stress   Pain Relieving Factors medicine   Effect of Pain on Daily Activities Currently has a headache due to stress   Multiple Pain Sites No            [facility] PT Assessment - 11/18/14 0001    Assessment   Medical Diagnosis Left breast cancer   Onset Date/Surgical Date 11/10/14   Hand Dominance Right   Precautions   Precautions Other (comment)  Active cancer   Restrictions   Weight Bearing Restrictions No   Balance Screen   Has the patient fallen in the past 6 months No   Has the patient had a decrease in activity level because of a fear of falling?  No   Is the patient reluctant to leave their home because of a fear of falling?  No   Home Ecologist residence   Living Arrangements Spouse/significant other   Available Help at Discharge Family   Prior Function   Level of [place] Retired   Leisure She does not exercise   Cognition   Overall Cognitive Status Within Functional Limits for tasks assessed   Posture/Postural Control   Posture/Postural Control Postural limitations    Postural Limitations Rounded Shoulders;Forward head  Complicated by recent cervical neck fusion   ROM / Strength   AROM / PROM / Strength AROM;Strength   AROM   AROM Assessment Site Shoulder   Right/Left Shoulder Right;Left   Right Shoulder Extension 42 Degrees   Right Shoulder Flexion 137 Degrees   Right Shoulder ABduction 152 Degrees   Right Shoulder Internal Rotation 74 Degrees   Right Shoulder External Rotation 77 Degrees   Left Shoulder Extension 44 Degrees   Left Shoulder Flexion 125 Degrees  Causes pain in neck   Left Shoulder ABduction 119 Degrees  Causes pain in neck   Left Shoulder Internal Rotation 66 Degrees   Left Shoulder External Rotation 83 Degrees   Strength   Overall Strength Within functional limits for tasks performed           LYMPHEDEMA/ONCOLOGY QUESTIONNAIRE - 11/18/14 1249    Type   Cancer Type Left breast cancer   Lymphedema Assessments   Lymphedema Assessments Upper extremities   Right Upper Extremity Lymphedema   10 cm Proximal to Olecranon Process 23.9 cm   Olecranon Process 22 cm   10 cm Proximal to Ulnar Styloid Process 18.8 cm   Just Proximal to Ulnar Styloid Process 14.4 cm   Across Hand at [employer] 16.7 cm   At [place] of 2nd Digit 5.8 cm   Left Upper Extremity Lymphedema   10 cm Proximal to Olecranon Process 23 cm   Olecranon Process 22.5 cm   10 cm Proximal to Ulnar Styloid Process 16.8 cm   Just Proximal to Ulnar Styloid Process 13.8 cm   Across Hand at [employer] 16 cm   At [place] of 2nd Digit 5.5 cm      Patient was instructed today in a home exercise program today for post op shoulder range of motion. These included active assist shoulder flexion in sitting, scapular retraction, wall walking with shoulder abduction, and hands behind head external rotation.  She was encouraged to do these twice a day, holding 3 seconds and repeating 5 times when permitted by her physician.         PT Education - 11/18/14 1255     Education provided Yes   Education Details Post op shoulder ROM HEP and lymphedema risk reduction   Person(s) Educated Patient;Spouse   Methods Explanation;Demonstration;Handout   Comprehension Verbalized understanding;Returned demonstration              Breast Clinic Goals - 11/18/14 1257    Patient will be able to verbalize understanding of pertinent lymphedema risk reduction practices relevant to her diagnosis specifically related to skin care.   Time 1   Period Days   Status Achieved  Patient will be able to return demonstrate and/or verbalize understanding of the post-op home exercise program related to regaining shoulder range of motion.   Time 1   Period Days   Status Achieved   Patient will be able to verbalize understanding of the importance of attending the postoperative After Breast Cancer Class for further lymphedema risk reduction education and therapeutic exercise.   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  Her mass measures 5.3 cm in size and is located at 12:00 position in the upper outer quadrant.   Pt will benefit from skilled therapeutic intervention in order to improve on the following deficits Decreased range of motion;Impaired UE functional use;Pain;Decreased knowledge of precautions;Decreased strength   Rehab Potential Excellent   Clinical Impairments Affecting Rehab Potential none   PT Frequency One time visit   PT Treatment/Interventions Patient/family education;Therapeutic exercise   Consulted and Agree with Plan of Care Patient;Family member/caregiver   Family Member Consulted Husband     Patient will follow up at outpatient cancer rehab if needed following surgery.  If the patient requires physical therapy at that time, a specific plan will be dictated and sent to the referring  physician for approval. The patient was educated today on appropriate basic range of motion exercises to begin post operatively and the importance of attending the After Breast Cancer class following surgery.  Patient was educated today on lymphedema risk reduction practices as it pertains to recommendations that will benefit the patient immediately following surgery.  She verbalized good understanding.  No additional physical therapy is indicated at this time.       Problem List Patient Active Problem List   Diagnosis Date Noted  . Breast cancer of upper-outer quadrant of left female breast 11/11/2014  . S/P cervical spinal fusion 06/24/2014  . HTN (hypertension) 12/06/2010  . Tobacco abuse 12/06/2010  . Hyperlipidemia 09/26/2010  . Nodular goiter 09/26/2010  . GERD (gastroesophageal reflux disease) 09/26/2010  . Degenerative disc disease 09/26/2010  . Allergic rhinitis 09/26/2010  . Anxiety disorder 09/26/2010  . Cyst, breast 09/26/2010    Annia Friendly, PT 11/18/2014, 1:01 PM  [place], [place], [address] Phone: [phone]   Fax:  [phone]

## 2014-11-18 NOTE — Telephone Encounter (Signed)
Left a message for the referral coordinator at Dr. Tawanna Sat office to see if pt needs a referral from PCP to see genetics.

## 2014-11-18 NOTE — Progress Notes (Signed)
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 female who is seen today at the breast multidisciplinary clinic through the courtesy Dr. Dalbert Batman for evaluation of her T3 N0 triple negative invasive ductal/DCIS of the left breast.  She noted an enlarging left breast mass for approximately 2 months.  Mammography on 11/02/2014  at the [facility] showed pleomorphic branching calcifications with associated increase masslike density within the left breast at 12:00 measuring 5.5 cm.  Targeted ultrasound showed a mass measuring 5.3 x 3.7 x 2.5 cm at 12:00, 4 cm from the nipple.  There was symmetric cortical prominence of multiple normal sized left axillary lymph nodes.  Ultrasound-guided biopsy on 11/09/2014 was diagnostic for invasive ductal carcinoma/DCIS.  The carcinoma was felt to be grade 3.  Her tumor was ER and PR negative, HER-2/neu negative with a proliferation index of 70%.  She is without complaints today.  PREVIOUS RADIATION THERAPY: No   PAST MEDICAL HISTORY:  has a past medical history of Hyperlipidemia; Hypertension; Complication of anesthesia; PONV (postoperative nausea and vomiting); GERD (gastroesophageal reflux disease); Headache; Arthritis; Scoliosis; and Cancer.     PAST SURGICAL HISTORY:  Past Surgical History  Procedure Laterality Date  . Cholecystectomy  1994  . Abdominal hysterectomy  2000    TAH/BSO  . Knee arthroscopy  2005    right knee  . Foot neuroma surgery  01/2010    also had metatarasl shortened  . Rhinoplasty    . Cervical fusion    . Anterior cervical decomp/discectomy fusion N/A 06/18/2014     Procedure: C4-5 C5-6 C6-7 ANTERIOR CERVICAL DECOMPRESSION/DISKECTOMY/FUSION;  Surgeon: Eustace Moore, MD;  Location: [place] [facility];  Service: Neurosurgery;  Laterality: N/A;  C4-5 C5-6 C6-7 ANTERIOR CERVICAL DECOMPRESSION/DISKECTOMY/FUSION  . Anterior cervical decomp/discectomy fusion N/A 06/25/2014    Procedure: EXPLORATION OF CERVICAL FUSION WITH REVIOSN OF HARDWARE;  Surgeon: Eustace Moore, MD;  Location: [place] [facility];  Service: Neurosurgery;  Laterality: N/A;     FAMILY HISTORY: family history includes Cancer in her maternal grandmother and paternal aunt; Diabetes in her mother; Heart disease in her father and mother; Hypertension in her father and mother.  Her mother died from cardiac disease at 6, and her father died from cardiac disease at 60.  2 paternal aunts were diagnosed with breast cancer at a postmenopausal age.   SOCIAL HISTORY:  reports that she has been smoking Cigarettes.  She has a 21 pack-year smoking history. She does not have any smokeless tobacco history on file. She reports that she does not drink alcohol or use illicit drugs.  Married, 2 children.  Retired Corporate treasurer, 2 years ago.   ALLERGIES: Codeine   MEDICATIONS:  Current Outpatient Prescriptions  Medication Sig Dispense Refill  . DEXILANT 60 MG capsule Take 60 mg by mouth daily.  4  . estradiol (ESTRACE) 1 MG tablet Take 1 mg by mouth daily.      . hydrochlorothiazide (HYDRODIURIL) 25 MG tablet Take 1 tablet (25 mg total) by mouth daily. 90 tablet 0   No current facility-administered medications for this  encounter.     REVIEW OF SYSTEMS:  Pertinent items are noted in HPI.    PHYSICAL EXAM:  Alert and oriented 64 year old white female appearing her stated age. Wt Readings from Last 3 Encounters:  11/18/14 119 lb 1.6 oz (54.023 kg)  11/12/14 120 lb (54.432 kg)  06/24/14 115 lb 8.3 oz (52.4 kg)   Temp Readings from Last 3 Encounters:  11/18/14 97.7 F (36.5 C) Oral  11/12/14 97.4 F (36.3 C) Oral  06/26/14  97.8 F (36.6 C)    BP Readings from Last 3 Encounters:  11/18/14 144/74  11/12/14 159/87  06/26/14 154/69   Pulse Readings from Last 3 Encounters:  11/18/14 80  11/12/14 101  06/26/14 86   Head and neck examination: Grossly unremarkable.  Nodes: Without palpable cervical, supraclavicular, or axillary lymphadenopathy.  Breasts: There is an obvious mass along the superior aspect of the left breast measuring at least 6 cm in greatest diameter.  The mass is mobile and is not fixed to the underlying skin.  There is overlying ecchymosis.  Right breast without masses or lesions.  Extremities: Without edema.    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  She obviously has an aggressive breast cancer.  She will have a baseline MRI scan followed by neo-adjuvant chemotherapy.  She understands that she probably need a mastectomy and based on the apparent extent of DCIS/calcifications seen on mammography.  However, she could have a biopsy of residual calcifications, after neo-adjuvant chemotherapy, for confirmation of DCIS prior to definitive surgery in the event she wants to consider breast conservation.  She may be a candidate for post mastectomy radiation therapy should she go on to have a mastectomy.  This decision can be made following definitive surgery.  We briefly discussed the potential acute and late toxicities of radiation therapy.  We also discussed deep inspiration breath-hold technology to avoid cardiac  irradiation.  She indicates that she would like to be treated in [place] should she require radiation therapy following her definitive surgery.   PLAN: As discussed above.  I spent 30  minutes face to face with the patient and more than 50% of that time was spent in counseling and/or coordination of care.

## 2014-11-18 NOTE — Progress Notes (Signed)
Ms. Dejarnett is a very pleasant 64 y.o. female from [place], [place] with newly diagnosed grade 3 invasive ductal carcinoma & DCIS of the left breast.  Biopsy results revealed the tumor's prognostic profile is ER negative, PR negative, and HER2/neu negative. Ki67 is 70%.  She presents today with her husband to the [facility]) for treatment consideration and recommendations from the breast surgeon, radiation oncologist, and medical oncologist.     I briefly met with Ms. Spinner and her husband during her [facility] visit today. We discussed the purpose of the Survivorship Clinic, which will include monitoring for recurrence, coordinating completion of age and gender-appropriate cancer screenings, promotion of overall wellness, as well as managing potential late/long-term side effects of anti-cancer treatments.    The treatment plan for Ms. Doig will likely include chemotherapy, surgery, and radiation therapy.  She will meet with the Genetics Counselor due to her family history of breast cancer. As of today, the intent of treatment for Ms. Fahs is cure, therefore she will be eligible for the Survivorship Clinic upon her completion of treatment.  Her survivorship care plan (SCP) document will be drafted and updated throughout the course of her treatment trajectory. She will receive the SCP in an office visit with myself in the Survivorship Clinic once she has completed treatment.   Ms. Lodato was encouraged to ask questions and all questions were answered to her satisfaction.  She was given my business card and encouraged to contact me with any concerns regarding survivorship.  I look forward to participating in her care.   Mike Craze, NP [place] [phone]

## 2014-11-18 NOTE — Progress Notes (Signed)
Clinical Social Work [place] Psychosocial Distress Screening [place]  Patient completed distress screening protocol and scored an 8 on the Psychosocial Distress Thermometer which indicates moderate distress. Clinical Social Worker met with patient and patient's husband in [facility] to assess for distress and other psychosocial needs. Patient stated she felt positive in her treatment plan after meeting with the treatment team. CSW and patient discussed common feeling and emotions when being diagnosed with cancer, and the importance of support during treatment. CSW informed patient of the support team and support services at [facility].  CSW and patient also discussed the [facility] and resources closer to her home.CSW provided contact information and encouraged patient to call with any questions or concerns.   ONCBCN DISTRESS SCREENING 11/18/2014  Screening Type Initial Screening  Distress experienced in past week (1-10) 8  Practical problem type Insurance  Emotional problem type Nervousness/Anxiety  Information Concerns Type Lack of info about diagnosis  Physical Problem type Nausea/vomiting  Physician notified of physical symptoms Yes  Referral to clinical psychology No  Referral to clinical social work Yes  Referral to dietition No  Referral to financial advocate No  Referral to support programs Yes  Referral to palliative care No   Johnnye Lana, MSW, LCSW, OSW-C Clinical Social Worker [place] [phone]

## 2014-11-18 NOTE — Progress Notes (Signed)
[place] CONSULT NOTE  Patient Care Team: Chipper Herb, MD as PCP - General (Family Medicine) Dr Geanie Berlin (Gynecology)  CHIEF COMPLAINTS/PURPOSE OF CONSULTATION:  Newly diagnosed breast cancer  HISTORY OF PRESENTING ILLNESS:  Sherry Bender 64 y.o. female is here because of recent diagnosis of left breast cancer. She had a palpable left breast abnormality for the past 2 months. She underwent a mammogram was abnormal that led to an ultrasound that revealed a 5.3 cm mass at 12:00 position the calcifications span 5.5 cm. She underwent ultrasound-guided biopsy which confirmed invasive ductal carcinoma with DCIS. Invasive cancer was grade 3 with ER PR 0% HER-2 negative with a Ki-67 of 70%. She is here today accompanied by her husband to discuss treatment options. She was presented this morning to the multidisciplinary tumor board. She is extremely anxious and worried and appears to be stressed  I reviewed her records extensively and collaborated the history with the patient.  SUMMARY OF ONCOLOGIC HISTORY:   Breast cancer of upper-outer quadrant of left female breast   11/09/2014 Mammogram Left breast irregular hypoechoic mass 12:00 position 5.3 x 3.7 x 2.9 cm, left low axilla normal sized lymph nodes   11/09/2014 Initial Diagnosis Left Bx: IDC with DCIS; Grade 3, Er 0%, PR 0%, Her 2 Neg, Ki 67: 70%   MEDICAL HISTORY:  Past Medical History  Diagnosis Date  . Hyperlipidemia   . Hypertension   . Complication of anesthesia     "HARD TO WAKE UP".......Marland KitchenN/V  . PONV (postoperative nausea and vomiting)   . GERD (gastroesophageal reflux disease)   . Headache     FROM THE NECK  . Arthritis     IN BACK....  . Scoliosis   . 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 Cholecystectomy  1994  . Abdominal hysterectomy  2000    TAH/BSO  . Knee arthroscopy  2005    right knee  . Foot neuroma surgery  01/2010    also had  metatarasl shortened  . Rhinoplasty    . Cervical fusion    . Anterior cervical decomp/discectomy fusion N/A 06/18/2014    Procedure: C4-5 C5-6 C6-7 ANTERIOR CERVICAL DECOMPRESSION/DISKECTOMY/FUSION;  Surgeon: Eustace Moore, MD;  Location: [place] [facility];  Service: Neurosurgery;  Laterality: N/A;  C4-5 C5-6 C6-7 ANTERIOR CERVICAL DECOMPRESSION/DISKECTOMY/FUSION  . Anterior cervical decomp/discectomy fusion N/A 06/25/2014    Procedure: EXPLORATION OF CERVICAL FUSION WITH REVIOSN OF HARDWARE;  Surgeon: Eustace Moore, MD;  Location: [place] [facility];  Service: Neurosurgery;  Laterality: N/A;    SOCIAL HISTORY: History   Social History  . Marital Status: Married    Spouse Name: N/A  . Number of Children: N/A  . Years of Education: N/A   Occupational History  . Not on file.   Social History Main Topics  . Smoking status: Current Every Day Smoker -- 0.50 packs/day for 42 years    Types: Cigarettes  . Smokeless tobacco: Not on file  . Alcohol Use: No  . Drug Use: No  . Sexual Activity: Not on file   Other Topics Concern  . 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     Two daughters born 41 and 65- one in [place] one  in [place].              FAMILY HISTORY: Family History  Problem Relation Age of Onset  . Heart disease Mother   . Hypertension Mother   . Diabetes Mother   . Heart disease Father   . Hypertension Father   . Cancer Paternal Aunt     ? breast  . Cancer Maternal Grandmother     ? ovarian cancer    ALLERGIES:  is allergic to codeine.  MEDICATIONS:  Current Outpatient Prescriptions  Medication Sig Dispense Refill  . DEXILANT 60 MG capsule Take 60 mg by mouth daily.  4  . estradiol (ESTRACE) 1 MG tablet Take 1 mg by mouth daily.      . hydrochlorothiazide (HYDRODIURIL) 25 MG tablet Take 1 tablet (25 mg total) by mouth  daily. 90 tablet 0   No current facility-administered medications for this visit.    REVIEW OF SYSTEMS:   Constitutional: Denies fevers, chills or abnormal night sweats Eyes: Denies blurriness of vision, double vision or watery eyes Ears, nose, mouth, throat, and face: Denies mucositis or sore throat Respiratory: Denies cough, dyspnea or wheezes Cardiovascular: Denies palpitation, chest discomfort or lower extremity swelling Gastrointestinal:  Denies nausea, heartburn or change in bowel habits Skin: Denies abnormal skin rashes Lymphatics: Denies new lymphadenopathy or easy bruising Neurological:Denies numbness, tingling or new weaknesses Behavioral/Psych: Mood is stable, no new changes  Breast: Palpable lump left breast All other systems were reviewed with the patient and are negative.  PHYSICAL EXAMINATION: ECOG PERFORMANCE STATUS: 1 - Symptomatic but completely ambulatory  Filed Vitals:   11/18/14 0819  BP: 144/74  Pulse: 80  Temp: 97.7 F (36.5 C)  Resp: 18   Filed Weights   11/18/14 0819  Weight: 119 lb 1.6 oz (54.023 kg)    GENERAL:alert, no distress and comfortable SKIN: skin color, texture, turgor are normal, no rashes or significant lesions EYES: normal, conjunctiva are pink and non-injected, sclera clear OROPHARYNX:no exudate, no erythema and lips, buccal mucosa, and tongue normal  NECK: supple, thyroid normal size, non-tender, without nodularity LYMPH:  no palpable lymphadenopathy in the cervical, axillary or inguinal LUNGS: clear to auscultation and percussion with normal breathing effort HEART: regular rate & rhythm and no murmurs and no lower extremity edema ABDOMEN:abdomen soft, non-tender and normal bowel sounds Musculoskeletal:no cyanosis of digits and no clubbing  PSYCH: alert & oriented x 3 with fluent speech NEURO: no focal motor/sensory deficits BREAST: Left breast lump. No palpable axillary or supraclavicular lymphadenopathy (exam performed in the  presence of a chaperone)   LABORATORY DATA:  I have reviewed the data as listed Lab Results  Component Value Date   WBC 4.6 11/18/2014   HGB 10.8* 11/18/2014   HCT 33.1* 11/18/2014   MCV 79.6 11/18/2014   PLT 203 11/18/2014   Lab Results  Component Value Date   NA 136 11/18/2014   K 3.0* 11/18/2014   CL 97 06/20/2014   CO2 29 11/18/2014    RADIOGRAPHIC STUDIES: I have personally reviewed the radiological reports and agreed with the findings in the report. Results are summarized as above  ASSESSMENT AND PLAN:  Breast cancer of upper-outer quadrant of left female breast Left breast invasive ductal carcinoma with DCIS: Patient presented with a palpable left breast mass for the last 2 months ultrasound 5.3 x 3.7 x 2.9 cm T3 N0 M0 stage IIB clinical stage grade 3, ER 0%, PR 0%, HER-2 negative, Ki-67 70%  Pathology and radiology review:Discussed with  the patient, the details of pathology including the type of breast cancer,the clinical staging, the significance of ER, PR and HER-2/neu receptors and the implications for treatment. After reviewing the pathology in detail, we proceeded to discuss the different treatment options between surgery, radiation, chemotherapy, antiestrogen therapies.  Recommendation based on multidisciplinary tumor board: 1. Neo-adjuvant chemotherapy with Adriamycin and Cytoxan every 2 weeks 4 followed by Abraxane weekly 12 2. Followed by surgery which could be with mastectomy versus lumpectomy (patient may need biopsies to demonstrate extent of disease after neo-adjuvant chemotherapy) 3. Genetics consultation 4. Breast MRI 5. Following surgery she may need adjuvant radiation if she undergoes lumpectomy  Chemotherapy counseling:I have discussed the risks and benefits of chemotherapy including the risks of nausea/ vomiting, risk of infection from low WBC count, fatigue due to chemo or anemia, bruising or bleeding due to low platelets, mouth sores, loss/ change  in taste and decreased appetite. Liver and kidney function will be monitored through out chemotherapy as abnormalities in liver and kidney function may be a side effect of treatment. Cardiac dysfunction due to Adriamycin was discussed in detail. Risk of permanent bone marrow dysfunction and leukemia due to chemo were also discussed.  Plan: 1. Genetics consult 2. Breast MRI 3. Port placement 4. Echocardiogram 5. Chemotherapy class 6. Start chemotherapy after port is been placed We recommended her stopping Estrogen supplementation    All questions were answered. The patient knows to call the clinic with any problems, questions or concerns.    Rulon Eisenmenger, MD 11:07 AM

## 2014-11-18 NOTE — Assessment & Plan Note (Signed)
Left breast invasive ductal carcinoma with DCIS: Patient presented with a palpable left breast mass for the last 2 months ultrasound 5.3 x 3.7 x 2.9 cm T3 N0 M0 stage IIB clinical stage grade 3, ER 0%, PR 0%, HER-2 negative, Ki-67 70%  Pathology and radiology review:Discussed with the patient, the details of pathology including the type of breast cancer,the clinical staging, the significance of ER, PR and HER-2/neu receptors and the implications for treatment. After reviewing the pathology in detail, we proceeded to discuss the different treatment options between surgery, radiation, chemotherapy, antiestrogen therapies.  Recommendation based on multidisciplinary tumor board: 1. Neo-adjuvant chemotherapy with Adriamycin and Cytoxan every 2 weeks 4 followed by Abraxane weekly 12 2. Followed by surgery which could be with mastectomy versus lumpectomy (patient may need biopsies to demonstrate extent of disease after neo-adjuvant chemotherapy) 3. Genetics consultation 4. Breast MRI 5. Following surgery she may need adjuvant radiation if she undergoes lumpectomy  Chemotherapy counseling:I have discussed the risks and benefits of chemotherapy including the risks of nausea/ vomiting, risk of infection from low WBC count, fatigue due to chemo or anemia, bruising or bleeding due to low platelets, mouth sores, loss/ change in taste and decreased appetite. Liver and kidney function will be monitored through out chemotherapy as abnormalities in liver and kidney function may be a side effect of treatment. Cardiac dysfunction due to Adriamycin was discussed in detail. Risk of permanent bone marrow dysfunction and leukemia due to chemo were also discussed.  Plan: 1. Genetics consult 2. Breast MRI 3. Port placement 4. Echocardiogram 5. Chemotherapy class 6. Start chemotherapy after port is been placed We recommended her stopping Estrogen supplementation

## 2014-11-18 NOTE — Patient Instructions (Signed)

## 2014-11-18 NOTE — Progress Notes (Signed)
Subjective:     Patient ID: Sherry Bender, female   DOB: 1951/02/09, 64 y.o.   MRN: [id number]  HPI   Review of Systems     Objective:   Physical Exam For the patient to understand and be given the tools to implement a healthy plant based diet during their cancer diagnosis.     Assessment:     Patient was seen today and found to be still having a headache and accompanied by her seemingly supportive husband. Pts left breast is affected. During breast clinic appointment pt requested a Tylenol due to severe headache. Pts potassium is severely low at 3 also Chloride 96, BUN 6.5, creatinine .5, HGB 10.8, HCT 33.1 with her past (5 months ago) electrolytes also being low. Physician-Medical oncologist states he will give her the Tylenol and prescribe potassium. Pt states she was a size fourteen but through unintentional wt loss is now a size 4; pt does not know why she lost the wt. Pt states she only has two small meals a day and no snacks. Pt states she does not like to eat her calories and rather drink them. Pt is 5'7'' 119 pounds BMI 18.7. Pt had a little trouble cognitively understanding the dietitian at first but then did not have much trouble as the appointment continued. Pts husband states he has been feeding her apple pie and ice cream to help her to maintain her wt.        Plan:     Dietitian educated the patient on implementing a plant based diet by incorporating more plant proteins, fruits, and vegetables. As a part of a healthy routine physical activity was discussed. Dietitian educated the pt on the importance of eating throughout the day and consuming enough nutrients/calories; smoothies recipes were offered as well as snacking ideas. Dietitian strongly advised she be aware of her wt and appetite and if either decrease she contact the dietitian.   The importance of legitimate, evidence based information was discussed and examples were given. A folder of evidence based information with a  focus on a plant based diet and general nutrition during cancer was given to the patient.  As a part of the continuum of care the cancer dietitian's contact information was given to the patient in the event they would like to have a follow up appointment.

## 2014-11-18 NOTE — Progress Notes (Signed)
Checked in new pt with no financial concerns prior to seeing the dr.  Informed pt if chemo is part of her treatment we will contact her ins to see if Josem Kaufmann is required and will obtain it if it is as well as contact foundations that offer copay assistance for chemo is needed.  She has my contact info.

## 2014-11-18 NOTE — Progress Notes (Signed)
Note created by Dr. Gudena during office visit. Copy to patient, original to scan. 

## 2014-11-19 ENCOUNTER — Encounter (HOSPITAL_BASED_OUTPATIENT_CLINIC_OR_DEPARTMENT_OTHER): Payer: Self-pay | Admitting: *Deleted

## 2014-11-19 ENCOUNTER — Other Ambulatory Visit: Payer: Managed Care, Other (non HMO)

## 2014-11-19 ENCOUNTER — Telehealth: Payer: Self-pay | Admitting: Hematology and Oncology

## 2014-11-19 NOTE — Telephone Encounter (Signed)
Called patient and she is aware of her echo   Sherry Bender

## 2014-11-20 NOTE — Progress Notes (Signed)
Dr Dalbert Batman made aware of K-3.0. No additional orders received

## 2014-11-21 NOTE — H&P (Signed)
Sherry Bender 11/18/2014 7:55 AM Location: [facility] Patient #: [id number] DOB: 10-02-1950 Undefined / Language: Undefined / Race: Undefined Female  History of Present Illness        This is a 64 year old Caucasian female, referred to the [facility] by Dr. Lovey Newcomer at the breast center rings were for evaluation and management of a large palpable breast cancer in the left breast centrally and superiorly. She was seen in the [facility]  by Dr. Lindi Adie, Dr. Valere Dross, and me. Dr. Terrance Mass is her PCP. \        She states that her last mammogram 2 years ago was normal. She has felt a lump in her left breast for 3 months and states that she thinks it is his growing. She is here today with her husband. Imaging studies show a mass in the left breast at the 12 o'clock position 4 cm from the nipple. The mass and calcifications were proximally 5.5 cm. It feels larger on exam. The breast is very dense. Axilla is probably negative. 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 This is a triple negative breast cancer with the KI-67 of 70%.      Past history reveals no prior breast problems. GERD. Ongoing tobacco abuse. Anxiety disorder with panic attacks. Hypertension. History of cervical discectomy earlier this year. History laparoscopic cholecystectomy. Status post hysterectomy and BSO. Family history is a little bit vague. She states 2 paternal aunts that had some type of cancer. She thinks her mother had some type of gynecologic cancer.        She and her husband live in [place]. She smokes daily. They've been married for 14 years. A second marriage. She has 2 daughters by her first marriage.       She has been advised to undergo genetic counseling and that has been arranged. She has been advised to undergo neoadjuvant chemotherapy, MRI and Port-A-Cath insertion and she is accepting of this. We have told her tha the tumor  might get smaller but were not sure that the calcifications will get any smaller. We decided that we would treat her triple negative breast cancer upfront and assuming we get a response, repeat the MRI. If we need to biopsy the calcifications at end of treatment to define the extent of disease we can do that.        She is aware the calcifications will not  get any smaller although the tumor might feel smaller. She is aware that it is possible she might become a candidate for a lumpectomy, but it is as least equally possible that she will still require mastectomy and sentinel node biopsy. She seems to understand this. We have suggested that she stop her Estrace., although admittedly her cancer is ER negative. The cancer center is going to schedule breast MRI.        I discussed the indications, details, techniques, and numerous risks of Port-A-Cath insertion with her and her husband. She is aware of the risk of bleeding, infection, pneumothorax, air embolus, malfunction of the catheter requiring revision, possible need to attempt access on both sides, and other unforeseen problems. She understands these issues well. At this time all of her questions are answered. She agrees with this plan. I told her that my office will call her tomorrow to schedule a Port-A-Cath insertion. I will follow her during her neoadjuvant chemotherapy.   Other Problems  Anxiety Disorder Breast Cancer Gastroesophageal Reflux Disease Other disease,  cancer, significant illness  Past Surgical History  Breast Biopsy Left. Oral Surgery  Diagnostic Studies History  Colonoscopy never Mammogram within last year Pap Smear >5 years ago  Social History  Caffeine use Coffee, Tea. No alcohol use No drug use Tobacco use Current every day smoker.  Family History  Breast Cancer Family Members In General. Cervical Cancer Family Members In General. Diabetes Mellitus Mother. Heart Disease  Father, Mother. Hypertension Father.  Pregnancy / Birth History  Age at menarche 24 years. Age of menopause 7-50 Contraceptive History Oral contraceptives. Gravida 2 Maternal age 51-20 Para 2 Regular periods  Review of Systems General Not Present- Appetite Loss, Chills, Fatigue, Fever, Night Sweats, Weight Gain and Weight Loss. Skin Not Present- Change in Wart/Mole, Dryness, Hives, Jaundice, New Lesions, Non-Healing Wounds, Rash and Ulcer. HEENT Not Present- Earache, Hearing Loss, Hoarseness, Nose Bleed, Oral Ulcers, Ringing in the Ears, Seasonal Allergies, Sinus Pain, Sore Throat, Visual Disturbances, Wears glasses/contact lenses and Yellow Eyes. Respiratory Not Present- Bloody sputum, Chronic Cough, Difficulty Breathing, Snoring and Wheezing. Cardiovascular Not Present- Chest Pain, Difficulty Breathing Lying Down, Leg Cramps, Palpitations, Rapid Heart Rate, Shortness of Breath and Swelling of Extremities. Gastrointestinal Not Present- Abdominal Pain, Bloating, Bloody Stool, Change in Bowel Habits, Chronic diarrhea, Constipation, Difficulty Swallowing, Excessive gas, Gets full quickly at meals, Hemorrhoids, Indigestion, Nausea, Rectal Pain and Vomiting. Female Genitourinary Not Present- Frequency, Nocturia, Painful Urination, Pelvic Pain and Urgency. Musculoskeletal Not Present- Back Pain, Joint Pain, Joint Stiffness, Muscle Pain, Muscle Weakness and Swelling of Extremities. Neurological Not Present- Decreased Memory, Fainting, Headaches, Numbness, Seizures, Tingling, Tremor, Trouble walking and Weakness. Psychiatric Not Present- Anxiety, Bipolar, Change in Sleep Pattern, Depression, Fearful and Frequent crying. Endocrine Not Present- Cold Intolerance, Excessive Hunger, Hair Changes, Heat Intolerance, Hot flashes and New Diabetes. Hematology Not Present- Easy Bruising, Excessive bleeding, Gland problems, HIV and Persistent Infections.   Physical Exam General Mental  Status-Alert. General Appearance-Consistent with stated age. Hydration-Well hydrated. Voice-Normal.  Head and Neck Head-normocephalic, atraumatic with no lesions or palpable masses. Trachea-midline.  Eye Eyeball - Bilateral-Extraocular movements intact. Sclera/Conjunctiva - Bilateral-No scleral icterus.  Chest and Lung Exam Chest and lung exam reveals -quiet, even and easy respiratory effort with no use of accessory muscles and on auscultation, normal breath sounds, no adventitious sounds and normal vocal resonance. Inspection Chest Wall - Normal. Back - normal.  Breast Breast - Left-Symmetric, Non Tender, No Biopsy scars, no Dimpling, No Inflammation, No Lumpectomy scars, No Mastectomy scars, No Peau d' Orange. Breast - Right-Symmetric, Non Tender, No Biopsy scars, no Dimpling, No Inflammation, No Lumpectomy scars, No Mastectomy scars, No Peau d' Orange. Note: Tiny needle biopsy scar left breast. Large palpable mass at 12 o'clock position. This feels at least 7 cm by physical exam. Mobile and not fixed to chest wall. Not invading skin at this time. Some ecchymoses but I don't sense that there is a hematoma. No other skin change or mass in either breast. No palpable axillary adenopathy.   Cardiovascular Cardiovascular examination reveals -normal heart sounds, regular rate and rhythm with no murmurs and normal pedal pulses bilaterally.  Abdomen Palpation/Percussion Palpation and Percussion of the abdomen reveal - Soft, Non Tender, No Rebound tenderness, No Rigidity (guarding) and No hepatosplenomegaly. Auscultation Auscultation of the abdomen reveals - Bowel sounds normal. Note: Soft and nontender. Scars from laparoscopic cholecystectomy and hysterectomy.   Neurologic Neurologic evaluation reveals -alert and oriented x 3 with no impairment of recent or remote memory. Mental Status-Normal.  Neuropsychiatric Note: She is anxious.  She sometimes has a  difficult time focusing on the conversation but is generally cooperative and seems to understand and comprehend the discussion quite well.   Musculoskeletal Normal Exam - Left-Upper Extremity Strength Normal and Lower Extremity Strength Normal. Normal Exam - Right-Upper Extremity Strength Normal, Lower Extremity Weakness.    Assessment & Plan   CANCER OF CENTRAL PORTION OF LEFT BREAST (174.1  C50.112)  Schedule for Surgery Your recent imaging studies and biopsies show a large cancer in the left breast in the upper central breast. This is at least 5-1/2 cm in size and feels bigger. Biopsies showed that this is an invasive ductal carcinoma, high-grade. This is also a triple negative breast cancer which makes it a little more aggressive. We have decided that the best treatment for you is to give chemotherapy first in hopes of shrinking the tumor and then later proceeding with definitive surgery to the breast. At this point in time I cannot tell whether this will shrink the tumor enough to allow a lumpectomy, or whether you will need a mastectomy. It is still a good idea to treat you with chemotherapy first. The cancer center will schedule you for a breast MRI. This will define the extent of your disease better. The cancer center will schedule you for genetic counseling. Dr. Darrel Hoover office will call you to schedule a Port-A-Cath insertion as we discussed. We have discussed the techniques and risks of Port-A-Cath insertion in detail today.  Pt Education - Central Venous Infusion Catheter with Implantable Port: implantable port   CHRONIC GERD (530.81  K21.9)  HYPERTENSION, BENIGN (401.1  I10) Impression: on HCTZ  ANXIETY DISORDER DUE TO GENERAL MEDICAL CONDITION WITH PANIC ATTACK (300.01  F41.0) Impression: This is part of her problem list. Need to confirm.  TOBACCO ABUSE (305.1  Z72.0)  HISTORY OF FUSION OF CERVICAL SPINE (V45.4  Z98.1)  HISTORY OF HYSTERECTOMY WITH  OOPHORECTOMY (V88.01  Z90.710)  HISTORY OF LAPAROSCOPIC CHOLECYSTECTOMY (V45.89  Z90.49)    Edsel Petrin. Dalbert Batman, M.D., [facility] General and Minimally invasive Surgery Breast and Colorectal Surgery Office:   [phone] Pager:   [phone]

## 2014-11-23 ENCOUNTER — Ambulatory Visit (HOSPITAL_BASED_OUTPATIENT_CLINIC_OR_DEPARTMENT_OTHER): Payer: Managed Care, Other (non HMO) | Admitting: Anesthesiology

## 2014-11-23 ENCOUNTER — Ambulatory Visit (HOSPITAL_COMMUNITY): Payer: Managed Care, Other (non HMO)

## 2014-11-23 ENCOUNTER — Ambulatory Visit (HOSPITAL_BASED_OUTPATIENT_CLINIC_OR_DEPARTMENT_OTHER)
Admission: RE | Admit: 2014-11-23 | Discharge: 2014-11-23 | Disposition: A | Discharge status: Home / Self Care | Payer: Managed Care, Other (non HMO) | Source: Ambulatory Visit | Attending: General Surgery | Admitting: General Surgery

## 2014-11-23 ENCOUNTER — Encounter (HOSPITAL_BASED_OUTPATIENT_CLINIC_OR_DEPARTMENT_OTHER): Payer: Self-pay | Admitting: *Deleted

## 2014-11-23 ENCOUNTER — Encounter (HOSPITAL_BASED_OUTPATIENT_CLINIC_OR_DEPARTMENT_OTHER)
Admission: RE | Disposition: A | Discharge status: Home / Self Care | Payer: Self-pay | Source: Ambulatory Visit | Attending: General Surgery

## 2014-11-23 DIAGNOSIS — Z01811 Encounter for preprocedural respiratory examination: Secondary | ICD-10-CM

## 2014-11-23 DIAGNOSIS — Z95828 Presence of other vascular implants and grafts: Secondary | ICD-10-CM

## 2014-11-23 DIAGNOSIS — Z859 Personal history of malignant neoplasm, unspecified: Secondary | ICD-10-CM | POA: Insufficient documentation

## 2014-11-23 DIAGNOSIS — K219 Gastro-esophageal reflux disease without esophagitis: Secondary | ICD-10-CM | POA: Insufficient documentation

## 2014-11-23 DIAGNOSIS — I10 Essential (primary) hypertension: Secondary | ICD-10-CM | POA: Diagnosis not present

## 2014-11-23 DIAGNOSIS — C50412 Malignant neoplasm of upper-outer quadrant of left female breast: Secondary | ICD-10-CM | POA: Diagnosis present

## 2014-11-23 DIAGNOSIS — F410 Panic disorder [episodic paroxysmal anxiety] without agoraphobia: Secondary | ICD-10-CM | POA: Diagnosis not present

## 2014-11-23 DIAGNOSIS — Z79899 Other long term (current) drug therapy: Secondary | ICD-10-CM | POA: Insufficient documentation

## 2014-11-23 DIAGNOSIS — Z419 Encounter for procedure for purposes other than remedying health state, unspecified: Secondary | ICD-10-CM

## 2014-11-23 DIAGNOSIS — C50912 Malignant neoplasm of unspecified site of left female breast: Secondary | ICD-10-CM | POA: Diagnosis present

## 2014-11-23 DIAGNOSIS — Z90710 Acquired absence of both cervix and uterus: Secondary | ICD-10-CM | POA: Diagnosis not present

## 2014-11-23 DIAGNOSIS — Z171 Estrogen receptor negative status [ER-]: Secondary | ICD-10-CM | POA: Diagnosis not present

## 2014-11-23 DIAGNOSIS — Z803 Family history of malignant neoplasm of breast: Secondary | ICD-10-CM | POA: Insufficient documentation

## 2014-11-23 DIAGNOSIS — C50112 Malignant neoplasm of central portion of left female breast: Secondary | ICD-10-CM | POA: Diagnosis not present

## 2014-11-23 DIAGNOSIS — Z9049 Acquired absence of other specified parts of digestive tract: Secondary | ICD-10-CM | POA: Diagnosis not present

## 2014-11-23 DIAGNOSIS — F17200 Nicotine dependence, unspecified, uncomplicated: Secondary | ICD-10-CM | POA: Insufficient documentation

## 2014-11-23 DIAGNOSIS — Z981 Arthrodesis status: Secondary | ICD-10-CM | POA: Diagnosis not present

## 2014-11-23 DIAGNOSIS — M1990 Unspecified osteoarthritis, unspecified site: Secondary | ICD-10-CM | POA: Diagnosis not present

## 2014-11-23 HISTORY — DX: Frequency of micturition: R35.0

## 2014-11-23 HISTORY — DX: Anxiety disorder, unspecified: F41.9

## 2014-11-23 HISTORY — PX: PORTACATH PLACEMENT: SHX2246

## 2014-11-23 SURGERY — INSERTION, TUNNELED CENTRAL VENOUS DEVICE, WITH PORT
Anesthesia: General | Site: Chest

## 2014-11-23 MED ORDER — LIDOCAINE HCL (CARDIAC) 20 MG/ML IV SOLN
INTRAVENOUS | Status: DC | PRN
  Administered 2014-11-23: 50 mg via INTRAVENOUS

## 2014-11-23 MED ORDER — GLYCOPYRROLATE 0.2 MG/ML IJ SOLN: 0.2000 mg | Freq: Once | INTRAMUSCULAR | Status: DC | PRN

## 2014-11-23 MED ORDER — PROMETHAZINE HCL 25 MG/ML IJ SOLN: 6.2500 mg | INTRAMUSCULAR | Status: DC | PRN

## 2014-11-23 MED ORDER — OXYCODONE HCL 5 MG PO TABS: 5.0000 mg | ORAL_TABLET | Freq: Once | ORAL | Status: DC | PRN

## 2014-11-23 MED ORDER — FENTANYL CITRATE (PF) 100 MCG/2ML IJ SOLN
INTRAMUSCULAR | Status: DC | PRN
  Administered 2014-11-23 (×2): 50 ug via INTRAVENOUS

## 2014-11-23 MED ORDER — FENTANYL CITRATE (PF) 100 MCG/2ML IJ SOLN
50.0000 ug | INTRAMUSCULAR | Status: DC | PRN
Start: 2014-11-23 — End: 2014-11-23

## 2014-11-23 MED ORDER — HYDROMORPHONE HCL 1 MG/ML IJ SOLN
INTRAMUSCULAR | Status: AC
Start: 2014-11-23 — End: 2014-11-23
  Filled 2014-11-23: qty 1

## 2014-11-23 MED ORDER — HYDROCODONE-ACETAMINOPHEN 5-325 MG PO TABS: 1.0000 | ORAL_TABLET | Freq: Four times a day (QID) | ORAL | Status: DC | PRN

## 2014-11-23 MED ORDER — MIDAZOLAM HCL 5 MG/5ML IJ SOLN
INTRAMUSCULAR | Status: DC | PRN
  Administered 2014-11-23: 2 mg via INTRAVENOUS

## 2014-11-23 MED ORDER — MEPERIDINE HCL 25 MG/ML IJ SOLN: 6.2500 mg | INTRAMUSCULAR | Status: DC | PRN

## 2014-11-23 MED ORDER — SODIUM CHLORIDE 0.9 % IJ SOLN: 3.0000 mL | Freq: Two times a day (BID) | INTRAMUSCULAR | Status: DC

## 2014-11-23 MED ORDER — FENTANYL CITRATE (PF) 100 MCG/2ML IJ SOLN: 25.0000 ug | INTRAMUSCULAR | Status: DC | PRN

## 2014-11-23 MED ORDER — ACETAMINOPHEN 650 MG RE SUPP: 650.0000 mg | RECTAL | Status: DC | PRN

## 2014-11-23 MED ORDER — MIDAZOLAM HCL 2 MG/2ML IJ SOLN: 1.0000 mg | INTRAMUSCULAR | Status: DC | PRN

## 2014-11-23 MED ORDER — DEXAMETHASONE SODIUM PHOSPHATE 4 MG/ML IJ SOLN
INTRAMUSCULAR | Status: DC | PRN
  Administered 2014-11-23: 10 mg via INTRAVENOUS

## 2014-11-23 MED ORDER — MIDAZOLAM HCL 2 MG/2ML IJ SOLN
INTRAMUSCULAR | Status: AC
Start: 2014-11-23 — End: 2014-11-23
  Filled 2014-11-23: qty 2

## 2014-11-23 MED ORDER — BUPIVACAINE-EPINEPHRINE (PF) 0.5% -1:200000 IJ SOLN
INTRAMUSCULAR | Status: AC
  Filled 2014-11-23: qty 30

## 2014-11-23 MED ORDER — HEPARIN SOD (PORK) LOCK FLUSH 100 UNIT/ML IV SOLN
INTRAVENOUS | Status: AC
  Filled 2014-11-23: qty 5

## 2014-11-23 MED ORDER — OXYCODONE HCL 5 MG PO TABS: 5.0000 mg | ORAL_TABLET | ORAL | Status: DC | PRN

## 2014-11-23 MED ORDER — HYDROMORPHONE HCL 1 MG/ML IJ SOLN
0.2500 mg | INTRAMUSCULAR | Status: DC | PRN
  Administered 2014-11-23: 0.25 mg via INTRAVENOUS
  Administered 2014-11-23 (×2): 0.5 mg via INTRAVENOUS
  Administered 2014-11-23: 0.25 mg via INTRAVENOUS

## 2014-11-23 MED ORDER — SODIUM CHLORIDE 0.9 % IV SOLN: 250.0000 mL | INTRAVENOUS | Status: DC | PRN

## 2014-11-23 MED ORDER — FENTANYL CITRATE (PF) 100 MCG/2ML IJ SOLN
INTRAMUSCULAR | Status: AC
  Filled 2014-11-23: qty 4

## 2014-11-23 MED ORDER — BUPIVACAINE-EPINEPHRINE (PF) 0.5% -1:200000 IJ SOLN
INTRAMUSCULAR | Status: DC | PRN
  Administered 2014-11-23: 12 mL via PERINEURAL

## 2014-11-23 MED ORDER — CHLORHEXIDINE GLUCONATE 4 % EX LIQD: 1.0000 "application " | Freq: Once | CUTANEOUS | Status: DC

## 2014-11-23 MED ORDER — ACETAMINOPHEN 325 MG PO TABS: 650.0000 mg | ORAL_TABLET | ORAL | Status: DC | PRN

## 2014-11-23 MED ORDER — SODIUM CHLORIDE 0.9 % IJ SOLN: 3.0000 mL | INTRAMUSCULAR | Status: DC | PRN

## 2014-11-23 MED ORDER — HEPARIN SOD (PORK) LOCK FLUSH 100 UNIT/ML IV SOLN
INTRAVENOUS | Status: DC | PRN
  Administered 2014-11-23: 500 [IU] via INTRAVENOUS

## 2014-11-23 MED ORDER — HEPARIN (PORCINE) IN NACL 2-0.9 UNIT/ML-% IJ SOLN
INTRAMUSCULAR | Status: AC
  Filled 2014-11-23: qty 500

## 2014-11-23 MED ORDER — ONDANSETRON HCL 4 MG/2ML IJ SOLN
INTRAMUSCULAR | Status: DC | PRN
  Administered 2014-11-23: 4 mg via INTRAVENOUS

## 2014-11-23 MED ORDER — CEFAZOLIN SODIUM-DEXTROSE 2-3 GM-% IV SOLR
INTRAVENOUS | Status: AC
  Filled 2014-11-23: qty 50

## 2014-11-23 MED ORDER — CEFAZOLIN SODIUM-DEXTROSE 2-3 GM-% IV SOLR
2.0000 g | INTRAVENOUS | Status: AC
  Administered 2014-11-23: 2 g via INTRAVENOUS

## 2014-11-23 MED ORDER — BUPIVACAINE HCL (PF) 0.5 % IJ SOLN
INTRAMUSCULAR | Status: AC
  Filled 2014-11-23: qty 30

## 2014-11-23 MED ORDER — LACTATED RINGERS IV SOLN
INTRAVENOUS | Status: DC
  Administered 2014-11-23 (×2): via INTRAVENOUS

## 2014-11-23 MED ORDER — HEPARIN (PORCINE) IN NACL 2-0.9 UNIT/ML-% IJ SOLN
INTRAMUSCULAR | Status: DC | PRN
Start: 2014-11-23 — End: 2014-11-23
  Administered 2014-11-23: 1 via INTRAVENOUS

## 2014-11-23 MED ORDER — OXYCODONE HCL 5 MG/5ML PO SOLN: 5.0000 mg | Freq: Once | ORAL | Status: DC | PRN

## 2014-11-23 SURGICAL SUPPLY — 51 items
BAG DECANTER FOR FLEXI CONT (MISCELLANEOUS) ×2 IMPLANT
BENZOIN TINCTURE PRP APPL 2/3 (GAUZE/BANDAGES/DRESSINGS) IMPLANT
BLADE HEX COATED 2.75 (ELECTRODE) ×2 IMPLANT
BLADE SURG 15 STRL LF DISP TIS (BLADE) ×1 IMPLANT
BLADE SURG 15 STRL SS (BLADE) ×1
CANISTER SUCT 1200ML W/VALVE (MISCELLANEOUS) IMPLANT
CHLORAPREP W/TINT 26ML (MISCELLANEOUS) ×2 IMPLANT
COVER BACK TABLE 60X90IN (DRAPES) ×2 IMPLANT
COVER MAYO STAND STRL (DRAPES) ×2 IMPLANT
COVER PROBE 5X48 (MISCELLANEOUS) ×1
DECANTER SPIKE VIAL GLASS SM (MISCELLANEOUS) IMPLANT
DERMABOND ADVANCED (GAUZE/BANDAGES/DRESSINGS) ×1
DERMABOND ADVANCED .7 DNX12 (GAUZE/BANDAGES/DRESSINGS) ×1 IMPLANT
DRAPE C-ARM 42X72 X-RAY (DRAPES) ×2 IMPLANT
DRAPE LAPAROSCOPIC ABDOMINAL (DRAPES) ×2 IMPLANT
DRAPE UTILITY XL STRL (DRAPES) ×2 IMPLANT
DRSG TEGADERM 2-3/8X2-3/4 SM (GAUZE/BANDAGES/DRESSINGS) IMPLANT
DRSG TEGADERM 4X10 (GAUZE/BANDAGES/DRESSINGS) IMPLANT
DRSG TEGADERM 4X4.75 (GAUZE/BANDAGES/DRESSINGS) IMPLANT
ELECT REM PT RETURN 9FT ADLT (ELECTROSURGICAL) ×2
ELECTRODE REM PT RTRN 9FT ADLT (ELECTROSURGICAL) ×1 IMPLANT
GLOVE BIO SURGEON STRL SZ 6.5 (GLOVE) ×2 IMPLANT
GLOVE BIOGEL M STRL SZ7.5 (GLOVE) ×2 IMPLANT
GLOVE EUDERMIC 7 POWDERFREE (GLOVE) ×2 IMPLANT
GOWN STRL REUS W/ TWL LRG LVL3 (GOWN DISPOSABLE) ×2 IMPLANT
GOWN STRL REUS W/ TWL XL LVL3 (GOWN DISPOSABLE) ×1 IMPLANT
GOWN STRL REUS W/TWL LRG LVL3 (GOWN DISPOSABLE) ×2
GOWN STRL REUS W/TWL XL LVL3 (GOWN DISPOSABLE) ×1
IV CATH PLACEMENT UNIT 16 GA (IV SOLUTION) IMPLANT
IV KIT MINILOC 20X1 SAFETY (NEEDLE) IMPLANT
KIT CVR 48X5XPRB PLUP LF (MISCELLANEOUS) ×1 IMPLANT
KIT PORT POWER 8FR ISP CVUE (Catheter) ×2 IMPLANT
NEEDLE BLUNT 17GA (NEEDLE) IMPLANT
NEEDLE HYPO 22GX1.5 SAFETY (NEEDLE) ×2 IMPLANT
NEEDLE HYPO 25X1 1.5 SAFETY (NEEDLE) ×2 IMPLANT
PACK BASIN DAY SURGERY FS (CUSTOM PROCEDURE TRAY) ×2 IMPLANT
PENCIL BUTTON HOLSTER BLD 10FT (ELECTRODE) ×2 IMPLANT
SET SHEATH INTRODUCER 10FR (MISCELLANEOUS) IMPLANT
SHEATH COOK PEEL AWAY SET 9F (SHEATH) IMPLANT
SLEEVE SCD COMPRESS KNEE MED (MISCELLANEOUS) ×2 IMPLANT
SPONGE GAUZE 4X4 12PLY STER LF (GAUZE/BANDAGES/DRESSINGS) IMPLANT
STRIP CLOSURE SKIN 1/2X4 (GAUZE/BANDAGES/DRESSINGS) IMPLANT
SUT MNCRL AB 4-0 PS2 18 (SUTURE) ×2 IMPLANT
SUT PROLENE 2 0 CT2 30 (SUTURE) ×2 IMPLANT
SUT VICRYL 3-0 CR8 SH (SUTURE) ×2 IMPLANT
SYR 5ML LUER SLIP (SYRINGE) ×2 IMPLANT
SYRINGE 10CC LL (SYRINGE) ×2 IMPLANT
TOWEL OR 17X24 6PK STRL BLUE (TOWEL DISPOSABLE) ×4 IMPLANT
TOWEL OR NON WOVEN STRL DISP B (DISPOSABLE) ×2 IMPLANT
TUBE CONNECTING 20X1/4 (TUBING) ×2 IMPLANT
YANKAUER SUCT BULB TIP NO VENT (SUCTIONS) ×2 IMPLANT

## 2014-11-23 NOTE — Transfer of Care (Signed)
Immediate Anesthesia Transfer of Care Note  Patient: Sherry Bender  Procedure(s) Performed: Procedure(s): INSERTION PORT-A-CATH WITH ULTRASOUND (N/A)  Patient Location: PACU  Anesthesia Type:General  Level of Consciousness: awake and patient cooperative  Airway & Oxygen Therapy: Patient Spontanous Breathing and Patient connected to face mask oxygen  Post-op Assessment: Report given to RN and Post -op Vital signs reviewed and stable  Post vital signs: Reviewed and stable  Last Vitals:  Filed Vitals:   11/23/14 0953  BP: 145/60  Pulse: 79  Temp: 36.7 C  Resp: 18    Complications: No apparent anesthesia complications

## 2014-11-23 NOTE — Op Note (Signed)
Patient Name:           Sherry Bender   Date of Surgery:        11/23/2014  Pre op Diagnosis:      Invasive ductal carcinoma left breast, 5.5 cm, grade 3, triple negative breast cancer, stage T3, N0.  Post op Diagnosis:    same  Procedure:                 Insertion of [place] tunneled venous vascular access device                                      Use of fluoroscopy for guidance and positioning  Surgeon:                     Edsel Petrin. Dalbert Batman, M.D., FACS  Assistant:                      OR staff  Operative Indications:    This is a 64 year old Caucasian female, referred to the [facility] by Dr. Lovey Newcomer at the breast center of [facility]  for evaluation and management of a large palpable breast cancer in the left breast centrally and superiorly. She was seen in the [facility] by Dr. Lindi Adie, Dr. Valere Dross, and me. Dr. Terrance Mass is her PCP.   She states that her last mammogram 2 years ago was normal. She has felt a lump in her left breast for 3 months and states that she thinks it is his growing.  Imaging studies show a mass in the left breast at the 12 o'clock position 4 cm from the nipple. The mass and calcifications were proximally 5.5 cm. It feels larger on exam. The breast is very dense. Axilla is probably negative. 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 This is a triple negative breast cancer with the KI-67 of 70%.  Past history reveals no prior breast problems. GERD. Ongoing tobacco abuse. Anxiety disorder with panic attacks. Hypertension. History of cervical discectomy earlier this year. History laparoscopic cholecystectomy. Status post hysterectomy and BSO. Family history is a little bit vague. She states 2 paternal aunts that had some type of cancer. She thinks her mother had some type of gynecologic cancer.   She and her husband live in [place]. She smokes daily.    She has been advised to undergo genetic counseling and that has been arranged. She has been advised to undergo neoadjuvant chemotherapy, MRI and Port-A-Cath insertion and she is accepting of this. MRI has been scheduled and is pending.  We have told her tha the tumor might get smaller but were not sure that the calcifications will get any smaller. We decided that we would treat her triple negative breast cancer upfront and assuming we get a response, repeat the MRI. If we need to biopsy the calcifications at end of treatment to define the extent of disease we can do that.   She is aware the calcifications will not get any smaller although the tumor might feel smaller. She is aware that it is possible she might become a candidate for a lumpectomy, but it is as least equally possible that she will still require mastectomy and sentinel node biopsy. She seems to understand this. We have suggested that she stop her Estrace., although admittedly her cancer is ER negative. The  cancer center is going to schedule breast MRI.   Operative Findings:       The port and catheter were inserted through the right subclavian vein without any significant difficulty.  The catheter flushed well and had excellent blood return at the completion of the case.  Fluoroscopic and images showed no deformity of the catheter and the catheter was down in the superior vena cava near the right atrium.  Procedure in Detail:          Following the induction of general LMA anesthesia the patient was positioned with a small roll between her shoulders and her arms tucked at her sides.  The neck and chest were prepped and draped in a sterile fashion.  Intravenous antibiotic's were given.  Surgical timeout was performed.  0.5% Marcaine with epinephrine was used as a local infiltration anesthesia.  A right subclavian venipuncture was performed with blood return on the first pass.  Guidewire was inserted into the superior vena  cava under fluoroscopic guidance.  Using the C-arm as a guide I drew a template on the chest wall to guide positioning and  insertion of the catheter so the catheter tip would be in the vena cava near the right atrium.  A small incision was made at the wire insertion site.  A second, transverse incision was made about 3 cm below the midpoint of the right clavicle.  Subcutaneous  pocket was created.  The catheter was passed from the wire insertion site to the port pocket site using a tunneling device.  Using the template on the chest wall I measured the catheter and cut it 19 cm in length.  The catheter was then secured to the port with the locking device and the port and catheter flushed with heparinized saline.  The port was placed in the pocket.  I inserted the dilator and peel-away sheath over the guidewire.  The wire and dilator were removed and the catheter slowly threaded into the central venous circulation and the peel-away sheath removed.  The catheter flushed well and had excellent blood return.  Using the C-arm we confirmed good positioning of the catheter as described above.  The port and catheter were then flushed with concentrated heparin.  The port was sutured to the pectoralis fascia with 3 interrupted sutures of 2-0 proline.  The subcutaneous  tissue was closed with 3-0 Vicryl sutures and the skin closed with a running subcuticular 4-0 Monocryl and Dermabond.  Patient tolerated the procedure well was taken to PACU in stable condition.  EBL 15 mL.  Counts correct.  Complications none.  A portal chest x-ray is planned in PACU.     Edsel Petrin. Dalbert Batman, M.D., FACS General and Minimally Invasive Surgery Breast and Colorectal Surgery  11/23/2014 12:35 PM

## 2014-11-23 NOTE — Anesthesia Postprocedure Evaluation (Signed)
Anesthesia Post Note  Patient: Sherry Bender  Procedure(s) Performed: Procedure(s) (LRB): INSERTION PORT-A-CATH WITH ULTRASOUND (N/A)  Anesthesia type: General  Patient location: PACU  Post pain: Pain level controlled  Post assessment: Post-op Vital signs reviewed  Last Vitals: BP 120/54 mmHg  Pulse 63  Temp(Src) 36.9 C (Oral)  Resp 16  Ht 5\' 6"  (1.676 m)  Wt 119 lb (53.978 kg)  BMI 19.22 kg/m2  SpO2 93%  LMP 02/28/1999  Post vital signs: Reviewed  Level of consciousness: sedated  Complications: No apparent anesthesia complications

## 2014-11-23 NOTE — Anesthesia Procedure Notes (Signed)
Procedure Name: LMA Insertion Date/Time: 11/23/2014 11:50 AM Performed by: Toula Moos L Pre-anesthesia Checklist: Patient identified, Emergency Drugs available, Suction available, Patient being monitored and Timeout performed Patient Re-evaluated:Patient Re-evaluated prior to inductionOxygen Delivery Method: Circle System Utilized Preoxygenation: Pre-oxygenation with 100% oxygen Intubation Type: IV induction Ventilation: Mask ventilation without difficulty LMA: LMA inserted LMA Size: 3.0 Number of attempts: 1 Airway Equipment and Method: Bite block Placement Confirmation: positive ETCO2 Tube secured with: Tape Dental Injury: Teeth and Oropharynx as per pre-operative assessment

## 2014-11-23 NOTE — Interval H&P Note (Signed)
History and Physical Interval Note:  11/23/2014 11:01 AM  Sherry Bender  has presented today for surgery, with the diagnosis of cancer left breast  The various methods of treatment have been discussed with the patient and family. After consideration of risks, benefits and other options for treatment, the patient has consented to  Procedure(s): INSERTION PORT-A-CATH WITH ULTRASOUND (N/A) as a surgical intervention .  The patient's history has been reviewed, patient examined, no change in status, stable for surgery.  I have reviewed the patient's chart and labs.  Questions were answered to the patient's satisfaction.     [name]

## 2014-11-23 NOTE — Anesthesia Preprocedure Evaluation (Signed)
Anesthesia Evaluation  Patient identified by MRN, date of birth, ID band Patient awake    Reviewed: Allergy & Precautions, NPO status , Patient's Chart, lab work & pertinent test results, reviewed documented beta blocker date and time   History of Anesthesia Complications (+) PONV  Airway Mallampati: II   Neck ROM: Full    Dental  (+) Teeth Intact, Dental Advisory Given   Pulmonary Current Smoker,  breath sounds clear to auscultation        Cardiovascular hypertension, Pt. on medications Rhythm:Regular  EKG WNL   Neuro/Psych Anxiety    GI/Hepatic Neg liver ROS, GERD-  Medicated,  Endo/Other  negative endocrine ROS  Renal/GU negative Renal ROS     Musculoskeletal  (+) Arthritis -,   Abdominal (+)  Abdomen: soft.    Peds  Hematology  (+) anemia , 10/31 H/H   Anesthesia Other Findings   Reproductive/Obstetrics                             Anesthesia Physical  Anesthesia Plan  ASA: III  Anesthesia Plan: General   Post-op Pain Management:    Induction: Intravenous  Airway Management Planned: LMA  Additional Equipment: None  Intra-op Plan:   Post-operative Plan: Extubation in OR  Informed Consent: I have reviewed the patients History and Physical, chart, labs and discussed the procedure including the risks, benefits and alternatives for the proposed anesthesia with the patient or authorized representative who has indicated his/her understanding and acceptance.   Dental advisory given  Plan Discussed with: CRNA  Anesthesia Plan Comments:         Anesthesia Quick Evaluation

## 2014-11-23 NOTE — Discharge Instructions (Signed)

## 2014-11-24 ENCOUNTER — Encounter (HOSPITAL_BASED_OUTPATIENT_CLINIC_OR_DEPARTMENT_OTHER): Payer: Self-pay | Admitting: General Surgery

## 2014-11-25 ENCOUNTER — Encounter: Payer: Self-pay | Admitting: Hematology and Oncology

## 2014-11-25 ENCOUNTER — Other Ambulatory Visit: Payer: Managed Care, Other (non HMO)

## 2014-11-25 ENCOUNTER — Encounter: Payer: Self-pay | Admitting: Genetic Counselor

## 2014-11-25 ENCOUNTER — Ambulatory Visit (HOSPITAL_COMMUNITY)
Admission: RE | Admit: 2014-11-25 | Discharge: 2014-11-25 | Disposition: A | Discharge status: Home / Self Care | Payer: Managed Care, Other (non HMO) | Source: Ambulatory Visit | Attending: Hematology and Oncology | Admitting: Hematology and Oncology

## 2014-11-25 ENCOUNTER — Ambulatory Visit (HOSPITAL_BASED_OUTPATIENT_CLINIC_OR_DEPARTMENT_OTHER): Payer: Managed Care, Other (non HMO) | Admitting: Genetic Counselor

## 2014-11-25 ENCOUNTER — Encounter: Payer: Self-pay | Admitting: *Deleted

## 2014-11-25 DIAGNOSIS — Z315 Encounter for genetic counseling: Secondary | ICD-10-CM

## 2014-11-25 DIAGNOSIS — C50412 Malignant neoplasm of upper-outer quadrant of left female breast: Secondary | ICD-10-CM | POA: Insufficient documentation

## 2014-11-25 DIAGNOSIS — Z09 Encounter for follow-up examination after completed treatment for conditions other than malignant neoplasm: Secondary | ICD-10-CM

## 2014-11-25 DIAGNOSIS — C50919 Malignant neoplasm of unspecified site of unspecified female breast: Secondary | ICD-10-CM | POA: Insufficient documentation

## 2014-11-25 NOTE — Progress Notes (Signed)
  Echocardiogram 2D Echocardiogram has been performed.  Sherry Bender 11/25/2014, 4:25 PM

## 2014-11-25 NOTE — Progress Notes (Signed)
Enrolled pt in the [employer] program.  Faxed signed form and activated card today.  °

## 2014-11-25 NOTE — Progress Notes (Signed)
REFERRING PROVIDER: Chipper Herb, MD [place], [place] [address]  PRIMARY PROVIDER:  Redge Gainer, MD  PRIMARY REASON FOR VISIT:  1. Breast cancer of upper-outer quadrant of left female breast      HISTORY OF PRESENT ILLNESS:   Ms. Pore, a 64 y.o. female, was seen for a [place] [facility] cancer genetics consultation at the request of Dr. Laurance Flatten due to a personal and family history of cancer.  Ms. Jerkins presents to clinic today to discuss the possibility of a hereditary predisposition to cancer, genetic testing, and to further clarify her future cancer risks, as well as potential cancer risks for family members.   In 2016, at the age of 35, Ms. 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 This is being treated with chemotherapy.    CANCER HISTORY:    Breast cancer of upper-outer quadrant of left female breast   11/09/2014 Mammogram Left breast irregular hypoechoic mass 12:00 position 5.3 x 3.7 x 2.9 cm, left low axilla normal sized lymph nodes   11/09/2014 Initial Diagnosis Left Bx: IDC with DCIS; Grade 3, Er 0%, PR 0%, Her 2 Neg, Ki 67: 70%     HORMONAL RISK FACTORS:  Menarche was at age 20-14.  First live birth at age 55.  OCP use for approximately 10 years.  Ovaries intact: no.  Hysterectomy: yes.  Menopausal status: postmenopausal.  HRT use: taking estradiol years. Colonoscopy: yes; normal. Mammogram within the last year: yes. Number of breast biopsies: 1. Up to date with pelvic exams:  no. Any excessive radiation exposure in the past:  no  Past Medical History  Diagnosis Date  . Hyperlipidemia   . Hypertension   . Complication of anesthesia     "HARD TO WAKE UP".......Marland KitchenN/V  . PONV (postoperative nausea and vomiting)   . GERD (gastroesophageal reflux disease)   . Arthritis     IN BACK....  . Scoliosis   . 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 Anxiety   . Urinary frequency   . Breast cancer 2016     Past Surgical History  Procedure Laterality Date  . Cholecystectomy  1994  . Abdominal hysterectomy  2000    TAH/BSO  . Knee arthroscopy  2005    right knee  . Foot neuroma surgery  01/2010    also had metatarasl shortened  . Rhinoplasty    . Cervical fusion    . Anterior cervical decomp/discectomy fusion N/A 06/18/2014    Procedure: C4-5 C5-6 C6-7 ANTERIOR CERVICAL DECOMPRESSION/DISKECTOMY/FUSION;  Surgeon: Eustace Moore, MD;  Location: [facility];  Service: Neurosurgery;  Laterality: N/A;  C4-5 C5-6 C6-7 ANTERIOR CERVICAL DECOMPRESSION/DISKECTOMY/FUSION  . Anterior cervical decomp/discectomy fusion N/A 06/25/2014    Procedure: EXPLORATION OF CERVICAL FUSION WITH REVIOSN OF HARDWARE;  Surgeon: Eustace Moore, MD;  Location: [facility];  Service: Neurosurgery;  Laterality: N/A;  . Portacath placement N/A 11/23/2014    Procedure: INSERTION PORT-A-CATH WITH ULTRASOUND;  Surgeon: Fanny Skates, MD;  Location: [facility];  Service: General;  Laterality: N/A;    History   Social History  . Marital Status: Married    Spouse Name: N/A  . Number of Children: 2  . Years of Education: N/A   Social History Main Topics  . Smoking status: Current Every Day Smoker -- 0.50 packs/day for 42 years    Types: Cigarettes  . Smokeless tobacco: Not on file  .  Alcohol Use: No  . Drug Use: No  . Sexual Activity: No   Other Topics Concern  . 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     Two daughters born 29 and 28- one in [place] one in [place].               FAMILY HISTORY:  We obtained a detailed, 4-generation family history.  Significant diagnoses are listed below: Family History  Problem Relation Age of Onset  . Heart disease Mother   . Hypertension Mother   . Diabetes Mother   . Heart disease Father   . Hypertension Father    . Cancer Paternal Aunt     ? breast  . Cancer Maternal Grandmother     ? ovarian cancer vs. cervical   The patient has five brothers and four sisters and two daughters.  One daughter was diagnosed with thyroid cancer at 25.  One brother may have had colon cancer, but she is unsure.  Her mother is deceased at 34 from diabetes.  She had 6 brothers and sister.  Her mother (patient's grandmother) may have had either cervical cancer or ovarian cancer.  The patient's father died of a blood clot at 54.  He had four brothers and two sisters.  One sister may have had breast cancer.  The patient is unsure of the age.  Patient's maternal ancestors are of Caucasian descent, and paternal ancestors are of Caucasian descent. There is no reported Ashkenazi Jewish ancestry. There is no known consanguinity.  GENETIC COUNSELING ASSESSMENT: Loran Auguste is a 64 y.o. female with a personal and family history of breast cancer which somewhat suggestive of a familial or sporadic disease. We, therefore, discussed and recommended the following at today's visit.   DISCUSSION: We discussed with Ms. Kvamme that the family history is not highly consistent with a familial hereditary cancer syndrome, and we feel she is at low risk to harbor a gene mutation associated with such a condition. If she is able to confirm other individuals who have had breast cancer in her family, then she would meet criteria for testing.  At this point, based on AET[employer]'s medical criteria, her diagnosis of breast cancer at 48 and one family member with possibly ovarian cancer is not enough to meet criteria. She would need one more person with either ovarian or breast cancer. Thus, we did not recommend any genetic testing, at this time, and recommended Ms. Holton continue to follow the cancer screening guidelines given by her primary healthcare provider.  PLAN: Ms. Kukla will try to obtain additional information on her family.  We encouraged Ms. Pedigo to  remain in contact with cancer genetics annually so that we can continuously update the family history and inform her of any changes in cancer genetics and testing that may be of benefit for this family.   Ms.  Tomson questions were answered to her satisfaction today. Our contact information was provided should additional questions or concerns arise. Thank you for the referral and allowing [place] to share in the care of your patient.   Muntaha Vermette P. Florene Glen, [place], [facility] Certified Genetic Counselor [email or web address] phone: [phone]  The patient was seen for a total of 30 minutes in face-to-face genetic counseling.  This patient was discussed with Drs. Magrinat, Lindi Adie and/or Burr Medico who agrees with the above.  _______________________________________________________________________ For Office Staff:  Number of people involved in session: 2 Was an Intern/ student involved with case: no

## 2014-11-26 ENCOUNTER — Other Ambulatory Visit: Payer: Self-pay | Admitting: *Deleted

## 2014-11-26 ENCOUNTER — Telehealth: Payer: Self-pay | Admitting: *Deleted

## 2014-11-26 ENCOUNTER — Ambulatory Visit
Admission: RE | Admit: 2014-11-26 | Discharge: 2014-11-26 | Disposition: A | Discharge status: Home / Self Care | Payer: Managed Care, Other (non HMO) | Source: Ambulatory Visit | Attending: Hematology and Oncology | Admitting: Hematology and Oncology

## 2014-11-26 DIAGNOSIS — C50412 Malignant neoplasm of upper-outer quadrant of left female breast: Secondary | ICD-10-CM

## 2014-11-26 MED ORDER — DEXAMETHASONE 4 MG PO TABS: ORAL_TABLET | ORAL | Status: DC

## 2014-11-26 MED ORDER — PROCHLORPERAZINE MALEATE 10 MG PO TABS: 10.0000 mg | ORAL_TABLET | Freq: Four times a day (QID) | ORAL | Status: DC | PRN

## 2014-11-26 MED ORDER — LIDOCAINE-PRILOCAINE 2.5-2.5 % EX CREA: TOPICAL_CREAM | CUTANEOUS | Status: DC

## 2014-11-26 MED ORDER — LORAZEPAM 0.5 MG PO TABS: 0.5000 mg | ORAL_TABLET | Freq: Every day | ORAL | Status: DC

## 2014-11-26 MED ORDER — GADOBENATE DIMEGLUMINE 529 MG/ML IV SOLN
11.0000 mL | Freq: Once | INTRAVENOUS | Status: AC | PRN
  Administered 2014-11-26: 11 mL via INTRAVENOUS

## 2014-11-26 MED ORDER — ONDANSETRON HCL 8 MG PO TABS: 8.0000 mg | ORAL_TABLET | Freq: Two times a day (BID) | ORAL | Status: DC | PRN

## 2014-11-26 NOTE — Telephone Encounter (Signed)
Spoke to pt concerning [place] from 11/18/14. Denies questions or concerns regarding dx or treatment care plan Confirmed future appts.  Encourage pt to call with needs. Received verbal understanding. Contact information given.

## 2014-11-27 ENCOUNTER — Ambulatory Visit: Payer: Managed Care, Other (non HMO) | Admitting: Physician Assistant

## 2014-11-30 ENCOUNTER — Other Ambulatory Visit: Payer: Self-pay

## 2014-11-30 DIAGNOSIS — C50412 Malignant neoplasm of upper-outer quadrant of left female breast: Secondary | ICD-10-CM

## 2014-12-01 ENCOUNTER — Other Ambulatory Visit: Payer: Managed Care, Other (non HMO)

## 2014-12-01 ENCOUNTER — Telehealth: Payer: Self-pay | Admitting: Oncology

## 2014-12-01 ENCOUNTER — Ambulatory Visit (HOSPITAL_BASED_OUTPATIENT_CLINIC_OR_DEPARTMENT_OTHER): Payer: Managed Care, Other (non HMO)

## 2014-12-01 ENCOUNTER — Encounter: Payer: Self-pay | Admitting: Oncology

## 2014-12-01 ENCOUNTER — Ambulatory Visit (HOSPITAL_BASED_OUTPATIENT_CLINIC_OR_DEPARTMENT_OTHER): Payer: Managed Care, Other (non HMO) | Admitting: Oncology

## 2014-12-01 ENCOUNTER — Encounter: Payer: Self-pay | Admitting: *Deleted

## 2014-12-01 ENCOUNTER — Telehealth: Payer: Self-pay | Admitting: Family Medicine

## 2014-12-01 ENCOUNTER — Other Ambulatory Visit: Payer: Self-pay | Admitting: Oncology

## 2014-12-01 ENCOUNTER — Other Ambulatory Visit (HOSPITAL_BASED_OUTPATIENT_CLINIC_OR_DEPARTMENT_OTHER): Payer: Managed Care, Other (non HMO)

## 2014-12-01 ENCOUNTER — Other Ambulatory Visit: Payer: Self-pay | Admitting: *Deleted

## 2014-12-01 VITALS — BP 117/81 | HR 87 | Temp 98.1°F | Resp 18 | Ht 66.0 in | Wt 117.9 lb

## 2014-12-01 DIAGNOSIS — C50412 Malignant neoplasm of upper-outer quadrant of left female breast: Secondary | ICD-10-CM

## 2014-12-01 DIAGNOSIS — Z5189 Encounter for other specified aftercare: Secondary | ICD-10-CM | POA: Diagnosis not present

## 2014-12-01 DIAGNOSIS — Z5111 Encounter for antineoplastic chemotherapy: Secondary | ICD-10-CM | POA: Diagnosis not present

## 2014-12-01 LAB — CBC WITH DIFFERENTIAL/PLATELET
BASO%: 1.1 % (ref 0.0–2.0)
Basophils Absolute: 0.1 10*3/uL (ref 0.0–0.1)
EOS%: 3.6 % (ref 0.0–7.0)
Eosinophils Absolute: 0.2 10*3/uL (ref 0.0–0.5)
HCT: 33.9 % — ABNORMAL LOW (ref 34.8–46.6)
HGB: 10.9 g/dL — ABNORMAL LOW (ref 11.6–15.9)
LYMPH%: 16.8 % (ref 14.0–49.7)
MCH: 25.6 pg (ref 25.1–34.0)
MCHC: 32.2 g/dL (ref 31.5–36.0)
MCV: 79.6 fL (ref 79.5–101.0)
MONO#: 0.5 10*3/uL (ref 0.1–0.9)
MONO%: 9.3 % (ref 0.0–14.0)
NEUT#: 3.8 10*3/uL (ref 1.5–6.5)
NEUT%: 69.2 % (ref 38.4–76.8)
Platelets: 209 10*3/uL (ref 145–400)
RBC: 4.26 10*6/uL (ref 3.70–5.45)
RDW: 15.1 % — ABNORMAL HIGH (ref 11.2–14.5)
WBC: 5.5 10*3/uL (ref 3.9–10.3)
lymph#: 0.9 10*3/uL (ref 0.9–3.3)

## 2014-12-01 LAB — COMPREHENSIVE METABOLIC PANEL (CC13)
ALT: 17 U/L (ref 0–55)
AST: 20 U/L (ref 5–34)
Albumin: 3.6 g/dL (ref 3.5–5.0)
Alkaline Phosphatase: 57 U/L (ref 40–150)
Anion Gap: 13 mEq/L — ABNORMAL HIGH (ref 3–11)
BUN: 6.6 mg/dL — ABNORMAL LOW (ref 7.0–26.0)
CO2: 28 mEq/L (ref 22–29)
Calcium: 9.2 mg/dL (ref 8.4–10.4)
Chloride: 94 mEq/L — ABNORMAL LOW (ref 98–109)
Creatinine: 0.5 mg/dL — ABNORMAL LOW (ref 0.6–1.1)
EGFR: >90 mL/min/{1.73_m2} (ref >90)
Glucose: 72 mg/dl (ref 70–140)
Potassium: 3.4 mEq/L — ABNORMAL LOW (ref 3.5–5.1)
Sodium: 135 mEq/L — ABNORMAL LOW (ref 136–145)
Total Bilirubin: 0.26 mg/dL (ref 0.20–1.20)
Total Protein: 7 g/dL (ref 6.4–8.3)

## 2014-12-01 MED ORDER — PALONOSETRON HCL INJECTION 0.25 MG/5ML
INTRAVENOUS | Status: AC
  Filled 2014-12-01: qty 5

## 2014-12-01 MED ORDER — HEPARIN SOD (PORK) LOCK FLUSH 100 UNIT/ML IV SOLN
500.0000 [IU] | Freq: Once | INTRAVENOUS | Status: AC | PRN
  Administered 2014-12-01: 500 [IU]
  Filled 2014-12-01: qty 5

## 2014-12-01 MED ORDER — DEXLANSOPRAZOLE 60 MG PO CPDR: 60.0000 mg | DELAYED_RELEASE_CAPSULE | Freq: Every day | ORAL | Status: DC

## 2014-12-01 MED ORDER — PEGFILGRASTIM 6 MG/0.6ML ~~LOC~~ PSKT
6.0000 mg | PREFILLED_SYRINGE | Freq: Once | SUBCUTANEOUS | Status: AC
  Administered 2014-12-01: 6 mg via SUBCUTANEOUS
  Filled 2014-12-01: qty 0.6

## 2014-12-01 MED ORDER — DOXORUBICIN HCL CHEMO IV INJECTION 2 MG/ML
60.0000 mg/m2 | Freq: Once | INTRAVENOUS | Status: AC
  Administered 2014-12-01: 96 mg via INTRAVENOUS
  Filled 2014-12-01: qty 48

## 2014-12-01 MED ORDER — SODIUM CHLORIDE 0.9 % IV SOLN
Freq: Once | INTRAVENOUS | Status: AC
  Administered 2014-12-01: 11:00:00 via INTRAVENOUS
  Filled 2014-12-01: qty 5

## 2014-12-01 MED ORDER — SODIUM CHLORIDE 0.9 % IV SOLN
600.0000 mg/m2 | Freq: Once | INTRAVENOUS | Status: AC
  Administered 2014-12-01: 960 mg via INTRAVENOUS
  Filled 2014-12-01: qty 48

## 2014-12-01 MED ORDER — PALONOSETRON HCL INJECTION 0.25 MG/5ML
0.2500 mg | Freq: Once | INTRAVENOUS | Status: AC
  Administered 2014-12-01: 0.25 mg via INTRAVENOUS

## 2014-12-01 MED ORDER — SODIUM CHLORIDE 0.9 % IJ SOLN
10.0000 mL | INTRAMUSCULAR | Status: DC | PRN
  Administered 2014-12-01: 10 mL
  Filled 2014-12-01: qty 10

## 2014-12-01 MED ORDER — SODIUM CHLORIDE 0.9 % IV SOLN
Freq: Once | INTRAVENOUS | Status: AC
  Administered 2014-12-01: 11:00:00 via INTRAVENOUS

## 2014-12-01 NOTE — Progress Notes (Signed)
Pt tolerated 1st A/C well without difficulty. AVS, neulasta on-pro, and antiemetics explained to pt.

## 2014-12-01 NOTE — Progress Notes (Signed)
Oncology Nurse Navigator Documentation  Oncology Nurse Navigator Flowsheets 12/01/2014  Navigator Encounter Type Treatment  Patient Visit Type Medonc  Treatment Phase First Chemo Tx  Barriers/Navigation Needs No barriers at this time  Time Spent with Patient 45   Met with pt prior and during 1st chemo treatment. Pt anxious and concerned on "how will I do". Gave pt emotional support and encouragement. Discussed anti-nausea medications and use. Encourage pt to call with questions or concerns. Received verbal understanding.

## 2014-12-01 NOTE — Patient Instructions (Addendum)
[facility] Discharge Instructions for Patients Receiving Chemotherapy  Today you received the following chemotherapy agents: Adriamycin, Cytoxan  To help prevent nausea and vomiting after your treatment, we encourage you to take your nausea medication as prescribed by your physician.   If you develop nausea and vomiting that is not controlled by your nausea medication, call the clinic.   BELOW ARE SYMPTOMS THAT SHOULD BE REPORTED IMMEDIATELY:  *FEVER GREATER THAN 100.5 F  *CHILLS WITH OR WITHOUT FEVER  NAUSEA AND VOMITING THAT IS NOT CONTROLLED WITH YOUR NAUSEA MEDICATION  *UNUSUAL SHORTNESS OF BREATH  *UNUSUAL BRUISING OR BLEEDING  TENDERNESS IN MOUTH AND THROAT WITH OR WITHOUT PRESENCE OF ULCERS  *URINARY PROBLEMS  *BOWEL PROBLEMS  UNUSUAL RASH Items with * indicate a potential emergency and should be followed up as soon as possible.  Feel free to call the clinic you have any questions or concerns. The clinic phone number is [phone].  Please show the [facility] at check-in to the Emergency Department and triage nurse.  Doxorubicin injection What is this medicine? DOXORUBICIN (dox oh ROO bi sin) is a chemotherapy drug. It is used to treat many kinds of cancer like Hodgkin's disease, leukemia, non-Hodgkin's lymphoma, neuroblastoma, sarcoma, and Wilms' tumor. It is also used to treat bladder cancer, breast cancer, lung cancer, ovarian cancer, stomach cancer, and thyroid cancer. This medicine may be used for other purposes; ask your health care provider or pharmacist if you have questions. COMMON BRAND NAME(S): Adriamycin, Adriamycin PFS, Adriamycin RDF, Rubex What should I tell my health care provider before I take this medicine? They need to know if you have any of these conditions: -blood disorders -heart disease, recent heart attack -infection (especially a virus infection such as chickenpox, cold sores, or herpes) -irregular heartbeat -liver  disease -recent or ongoing radiation therapy -an unusual or allergic reaction to doxorubicin, other chemotherapy agents, other medicines, foods, dyes, or preservatives -pregnant or trying to get pregnant -breast-feeding How should I use this medicine? This drug is given as an infusion into a vein. It is administered in a hospital or clinic by a specially trained health care professional. If you have pain, swelling, burning or any unusual feeling around the site of your injection, tell your health care professional right away. Talk to your pediatrician regarding the use of this medicine in children. Special care may be needed. Overdosage: If you think you have taken too much of this medicine contact a poison control center or emergency room at once. NOTE: This medicine is only for you. Do not share this medicine with others. What if I miss a dose? It is important not to miss your dose. Call your doctor or health care professional if you are unable to keep an appointment. What may interact with this medicine? Do not take this medicine with any of the following medications: -cisapride -droperidol -halofantrine -pimozide -zidovudine This medicine may also interact with the following medications: -chloroquine -chlorpromazine -clarithromycin -cyclophosphamide -cyclosporine -erythromycin -medicines for depression, anxiety, or psychotic disturbances -medicines for irregular heart beat like amiodarone, bepridil, dofetilide, encainide, flecainide, propafenone, quinidine -medicines for seizures like ethotoin, fosphenytoin, phenytoin -medicines for nausea, vomiting like dolasetron, ondansetron, palonosetron -medicines to increase blood counts like filgrastim, pegfilgrastim, sargramostim -methadone -methotrexate -pentamidine -progesterone -vaccines -verapamil Talk to your doctor or health care professional before taking any of these  medicines: -acetaminophen -aspirin -ibuprofen -ketoprofen -naproxen This list may not describe all possible interactions. Give your health care provider a list of all the  medicines, herbs, non-prescription drugs, or dietary supplements you use. Also tell them if you smoke, drink alcohol, or use illegal drugs. Some items may interact with your medicine. What should I watch for while using this medicine? Your condition will be monitored carefully while you are receiving this medicine. You will need important blood work done while you are taking this medicine. This drug may make you feel generally unwell. This is not uncommon, as chemotherapy can affect healthy cells as well as cancer cells. Report any side effects. Continue your course of treatment even though you feel ill unless your doctor tells you to stop. Your urine may turn red for a few days after your dose. This is not blood. If your urine is dark or brown, call your doctor. In some cases, you may be given additional medicines to help with side effects. Follow all directions for their use. Call your doctor or health care professional for advice if you get a fever, chills or sore throat, or other symptoms of a cold or flu. Do not treat yourself. This drug decreases your body's ability to fight infections. Try to avoid being around people who are sick. This medicine may increase your risk to bruise or bleed. Call your doctor or health care professional if you notice any unusual bleeding. Be careful brushing and flossing your teeth or using a toothpick because you may get an infection or bleed more easily. If you have any dental work done, tell your dentist you are receiving this medicine. Avoid taking products that contain aspirin, acetaminophen, ibuprofen, naproxen, or ketoprofen unless instructed by your doctor. These medicines may hide a fever. Men and women of childbearing age should use effective birth control methods while using taking this  medicine. Do not become pregnant while taking this medicine. There is a potential for serious side effects to an unborn child. Talk to your health care professional or pharmacist for more information. Do not breast-feed an infant while taking this medicine. Do not let others touch your urine or other body fluids for 5 days after each treatment with this medicine. Caregivers should wear latex gloves to avoid touching body fluids during this time. There is a maximum amount of this medicine you should receive throughout your life. The amount depends on the medical condition being treated and your overall health. Your doctor will watch how much of this medicine you receive in your lifetime. Tell your doctor if you have taken this medicine before. What side effects may I notice from receiving this medicine? Side effects that you should report to your doctor or health care professional as soon as possible: -allergic reactions like skin rash, itching or hives, swelling of the face, lips, or tongue -low blood counts - this medicine may decrease the number of white blood cells, red blood cells and platelets. You may be at increased risk for infections and bleeding. -signs of infection - fever or chills, cough, sore throat, pain or difficulty passing urine -signs of decreased platelets or bleeding - bruising, pinpoint red spots on the skin, black, tarry stools, blood in the urine -signs of decreased red blood cells - unusually weak or tired, fainting spells, lightheadedness -breathing problems -chest pain -fast, irregular heartbeat -mouth sores -nausea, vomiting -pain, swelling, redness at site where injected -pain, tingling, numbness in the hands or feet -swelling of ankles, feet, or hands -unusual bleeding or bruising Side effects that usually do not require medical attention (report to your doctor or health care professional if they continue  or are bothersome): -diarrhea -facial flushing -hair  loss -loss of appetite -missed menstrual periods -nail discoloration or damage -red or watery eyes -red colored urine -stomach upset This list may not describe all possible side effects. Call your doctor for medical advice about side effects. You may report side effects to FDA at 1-800-FDA-1088. Where should I keep my medicine? This drug is given in a hospital or clinic and will not be stored at home. NOTE: This sheet is a summary. It may not cover all possible information. If you have questions about this medicine, talk to your doctor, pharmacist, or health care provider.  2015, Elsevier/Gold Standard. (2012-10-01 09:54:34)  Cyclophosphamide injection What is this medicine? CYCLOPHOSPHAMIDE (sye kloe FOSS fa mide) is a chemotherapy drug. It slows the growth of cancer cells. This medicine is used to treat many types of cancer like lymphoma, myeloma, leukemia, breast cancer, and ovarian cancer, to name a few. This medicine may be used for other purposes; ask your health care provider or pharmacist if you have questions. COMMON BRAND NAME(S): Cytoxan, Neosar What should I tell my health care provider before I take this medicine? They need to know if you have any of these conditions: -blood disorders -history of other chemotherapy -infection -kidney disease -liver disease -recent or ongoing radiation therapy -tumors in the bone marrow -an unusual or allergic reaction to cyclophosphamide, other chemotherapy, other medicines, foods, dyes, or preservatives -pregnant or trying to get pregnant -breast-feeding How should I use this medicine? This drug is usually given as an injection into a vein or muscle or by infusion into a vein. It is administered in a hospital or clinic by a specially trained health care professional. Talk to your pediatrician regarding the use of this medicine in children. Special care may be needed. Overdosage: If you think you have taken too much of this medicine  contact a poison control center or emergency room at once. NOTE: This medicine is only for you. Do not share this medicine with others. What if I miss a dose? It is important not to miss your dose. Call your doctor or health care professional if you are unable to keep an appointment. What may interact with this medicine? This medicine may interact with the following medications: -amiodarone -amphotericin B -azathioprine -certain antiviral medicines for HIV or AIDS such as protease inhibitors (e.g., indinavir, ritonavir) and zidovudine -certain blood pressure medications such as benazepril, captopril, enalapril, fosinopril, lisinopril, moexipril, monopril, perindopril, quinapril, ramipril, trandolapril -certain cancer medications such as anthracyclines (e.g., daunorubicin, doxorubicin), busulfan, cytarabine, paclitaxel, pentostatin, tamoxifen, trastuzumab -certain diuretics such as chlorothiazide, chlorthalidone, hydrochlorothiazide, indapamide, metolazone -certain medicines that treat or prevent blood clots like warfarin -certain muscle relaxants such as succinylcholine -cyclosporine -etanercept -indomethacin -medicines to increase blood counts like filgrastim, pegfilgrastim, sargramostim -medicines used as general anesthesia -metronidazole -natalizumab This list may not describe all possible interactions. Give your health care provider a list of all the medicines, herbs, non-prescription drugs, or dietary supplements you use. Also tell them if you smoke, drink alcohol, or use illegal drugs. Some items may interact with your medicine. What should I watch for while using this medicine? Visit your doctor for checks on your progress. This drug may make you feel generally unwell. This is not uncommon, as chemotherapy can affect healthy cells as well as cancer cells. Report any side effects. Continue your course of treatment even though you feel ill unless your doctor tells you to stop. Drink  water or other fluids as directed. Urinate often,  even at night. In some cases, you may be given additional medicines to help with side effects. Follow all directions for their use. Call your doctor or health care professional for advice if you get a fever, chills or sore throat, or other symptoms of a cold or flu. Do not treat yourself. This drug decreases your body's ability to fight infections. Try to avoid being around people who are sick. This medicine may increase your risk to bruise or bleed. Call your doctor or health care professional if you notice any unusual bleeding. Be careful brushing and flossing your teeth or using a toothpick because you may get an infection or bleed more easily. If you have any dental work done, tell your dentist you are receiving this medicine. You may get drowsy or dizzy. Do not drive, use machinery, or do anything that needs mental alertness until you know how this medicine affects you. Do not become pregnant while taking this medicine or for 1 year after stopping it. Women should inform their doctor if they wish to become pregnant or think they might be pregnant. Men should not father a child while taking this medicine and for 4 months after stopping it. There is a potential for serious side effects to an unborn child. Talk to your health care professional or pharmacist for more information. Do not breast-feed an infant while taking this medicine. This medicine may interfere with the ability to have a child. This medicine has caused ovarian failure in some women. This medicine has caused reduced sperm counts in some men. You should talk with your doctor or health care professional if you are concerned about your fertility. If you are going to have surgery, tell your doctor or health care professional that you have taken this medicine. What side effects may I notice from receiving this medicine? Side effects that you should report to your doctor or health care  professional as soon as possible: -allergic reactions like skin rash, itching or hives, swelling of the face, lips, or tongue -low blood counts - this medicine may decrease the number of white blood cells, red blood cells and platelets. You may be at increased risk for infections and bleeding. -signs of infection - fever or chills, cough, sore throat, pain or difficulty passing urine -signs of decreased platelets or bleeding - bruising, pinpoint red spots on the skin, black, tarry stools, blood in the urine -signs of decreased red blood cells - unusually weak or tired, fainting spells, lightheadedness -breathing problems -dark urine -dizziness -palpitations -swelling of the ankles, feet, hands -trouble passing urine or change in the amount of urine -weight gain -yellowing of the eyes or skin Side effects that usually do not require medical attention (report to your doctor or health care professional if they continue or are bothersome): -changes in nail or skin color -hair loss -missed menstrual periods -mouth sores -nausea, vomiting This list may not describe all possible side effects. Call your doctor for medical advice about side effects. You may report side effects to FDA at 1-800-FDA-1088. Where should I keep my medicine? This drug is given in a hospital or clinic and will not be stored at home. NOTE: This sheet is a summary. It may not cover all possible information. If you have questions about this medicine, talk to your doctor, pharmacist, or health care provider.  2015, Elsevier/Gold Standard. (2012-04-19 16:22:58)

## 2014-12-01 NOTE — Telephone Encounter (Signed)
Zigmund Daniel talk to patient

## 2014-12-01 NOTE — Progress Notes (Signed)
[facility]   Patient Care Team: Chipper Herb, MD as PCP - General (Family Medicine) Dr Geanie Berlin (Gynecology) Holley Bouche, NP as Nurse Practitioner (Nurse Practitioner)  CHIEF COMPLAINTS/PURPOSE OF CONSULTATION:  Newly diagnosed breast cancer  HISTORY OF PRESENTING ILLNESS:  Sherry Bender 64 y.o. female is here because of recent diagnosis of left breast cancer. She had a palpable left breast abnormality for the past 2 months. She underwent a mammogram was abnormal that led to an ultrasound that revealed a 5.3 cm mass at 12:00 position the calcifications span 5.5 cm. She underwent ultrasound-guided biopsy which confirmed invasive ductal carcinoma with DCIS. Invasive cancer was grade 3 with ER PR 0% HER-2 negative with a Ki-67 of 70%. She is here today accompanied by her husband to begin neo-adjuvant chemotherapy with her first cycle of dose dense Adriamycin and Cytoxan. The patient has no complaints today. She is attended chemotherapy class, had her Port-A-Cath placed, had an echocardiogram, and has obtained her anti-emetic that have been prescribed for her.  I reviewed her records extensively and collaborated the history with the patient.  SUMMARY OF ONCOLOGIC HISTORY:   Breast cancer of upper-outer quadrant of left female breast   11/09/2014 Mammogram Left breast irregular hypoechoic mass 12:00 position 5.3 x 3.7 x 2.9 cm, left low axilla normal sized lymph nodes   11/09/2014 Initial Diagnosis Left Bx: IDC with DCIS; Grade 3, Er 0%, PR 0%, Her 2 Neg, Ki 67: 70%   MEDICAL HISTORY:  Past Medical History  Diagnosis Date  . Hyperlipidemia   . Hypertension   . Complication of anesthesia     "HARD TO WAKE UP".......Marland KitchenN/V  . PONV (postoperative nausea and vomiting)   . GERD (gastroesophageal reflux disease)   . Arthritis     IN BACK....  . Scoliosis   . 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 Anxiety   . Urinary frequency   . Breast cancer 2016    SURGICAL HISTORY: Past  Surgical History  Procedure Laterality Date  . Cholecystectomy  1994  . Abdominal hysterectomy  2000    TAH/BSO  . Knee arthroscopy  2005    right knee  . Foot neuroma surgery  01/2010    also had metatarasl shortened  . Rhinoplasty    . Cervical fusion    . Anterior cervical decomp/discectomy fusion N/A 06/18/2014    Procedure: C4-5 C5-6 C6-7 ANTERIOR CERVICAL DECOMPRESSION/DISKECTOMY/FUSION;  Surgeon: Eustace Moore, MD;  Location: [facility];  Service: Neurosurgery;  Laterality: N/A;  C4-5 C5-6 C6-7 ANTERIOR CERVICAL DECOMPRESSION/DISKECTOMY/FUSION  . Anterior cervical decomp/discectomy fusion N/A 06/25/2014    Procedure: EXPLORATION OF CERVICAL FUSION WITH REVIOSN OF HARDWARE;  Surgeon: Eustace Moore, MD;  Location: [facility];  Service: Neurosurgery;  Laterality: N/A;  . Portacath placement N/A 11/23/2014    Procedure: INSERTION PORT-A-CATH WITH ULTRASOUND;  Surgeon: Fanny Skates, MD;  Location: [facility];  Service: General;  Laterality: N/A;    SOCIAL HISTORY: History   Social History  . Marital Status: Married    Spouse Name: N/A  . Number of Children: 2  . Years of Education: N/A   Occupational History  . Not on file.   Social History Main Topics  . Smoking status: Current Every Day Smoker -- 0.50 packs/day for 42 years    Types: Cigarettes  . Smokeless tobacco: Not on file  . Alcohol Use: No  . Drug Use: No  . Sexual Activity: No  Other Topics Concern  . 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     Two daughters born 87 and 2- one in [place] one in [place].              FAMILY HISTORY: Family History  Problem Relation Age of Onset  . Heart disease Mother   . Hypertension Mother   . Diabetes Mother   . Heart disease Father   . Hypertension Father   . Cancer Paternal Aunt     ? breast  .  Cancer Maternal Grandmother     ? ovarian cancer vs. cervical    ALLERGIES:  is allergic to codeine.  MEDICATIONS:  Current Outpatient Prescriptions  Medication Sig Dispense Refill  . dexamethasone (DECADRON) 4 MG tablet Take 1 tablets by mouth once a day on the day after chemotherapy and then take 1 tablets two times a day for 2 days. Take with food. 30 tablet 1  . diazepam (VALIUM) 5 MG tablet Take 5 mg by mouth every 6 (six) hours as needed for anxiety.    Marland Kitchen estradiol (ESTRACE) 1 MG tablet Take 1 mg by mouth daily.      . hydrochlorothiazide (HYDRODIURIL) 25 MG tablet Take 1 tablet (25 mg total) by mouth daily. 90 tablet 0  . HYDROcodone-acetaminophen (NORCO) 5-325 MG per tablet Take 1-2 tablets by mouth every 6 (six) hours as needed for moderate pain or severe pain. 30 tablet 0  . lidocaine-prilocaine (EMLA) cream Apply to affected area once 30 g 3  . LORazepam (ATIVAN) 0.5 MG tablet Take 1 tablet (0.5 mg total) by mouth at bedtime. 30 tablet 0  . Multiple Vitamin (MULTIVITAMIN) tablet Take 1 tablet by mouth daily.    . ondansetron (ZOFRAN) 8 MG tablet Take 1 tablet (8 mg total) by mouth 2 (two) times daily as needed. Start on the third day after chemotherapy. 30 tablet 1  . Phenazopyridine HCl (AZO TABS PO) Take 1 tablet by mouth.    . prochlorperazine (COMPAZINE) 10 MG tablet Take 1 tablet (10 mg total) by mouth every 6 (six) hours as needed (Nausea or vomiting). 30 tablet 1  . dexlansoprazole (DEXILANT) 60 MG capsule Take 1 capsule (60 mg total) by mouth daily. 30 capsule 4   No current facility-administered medications for this visit.   Facility-Administered Medications Ordered in Other Visits  Medication Dose Route Frequency Provider Last Rate Last Dose  . sodium chloride 0.9 % injection 10 mL  10 mL Intracatheter PRN Nicholas Lose, MD   10 mL at 12/01/14 1302    REVIEW OF SYSTEMS:   Constitutional: Denies fevers, chills or abnormal night sweats Eyes: Denies blurriness of  vision, double vision or watery eyes Ears, nose, mouth, throat, and face: Denies mucositis or sore throat Respiratory: Denies cough, dyspnea or wheezes Cardiovascular: Denies palpitation, chest discomfort or lower extremity swelling Gastrointestinal:  Denies nausea, heartburn or change in bowel habits Skin: Denies abnormal skin rashes Lymphatics: Denies new lymphadenopathy or easy bruising Neurological:Denies numbness, tingling or new weaknesses Behavioral/Psych: Mood is stable, no new changes  Breast: Palpable lump left breast All other systems were reviewed with the patient and are negative.  PHYSICAL EXAMINATION: ECOG PERFORMANCE STATUS: 1 - Symptomatic but completely ambulatory  Filed Vitals:   12/01/14 1008  BP: 117/81  Pulse: 87  Temp:  98.1 F (36.7 C)  Resp: 18   Filed Weights   12/01/14 1008  Weight: 117 lb 14.4 oz (53.479 kg)    GENERAL:alert, no distress and comfortable SKIN: skin color, texture, turgor are normal, no rashes or significant lesions EYES: normal, conjunctiva are pink and non-injected, sclera clear OROPHARYNX:no exudate, no erythema and lips, buccal mucosa, and tongue normal  NECK: supple, thyroid normal size, non-tender, without nodularity LYMPH:  no palpable lymphadenopathy in the cervical, axillary or inguinal LUNGS: clear to auscultation and percussion with normal breathing effort HEART: regular rate & rhythm and no murmurs and no lower extremity edema ABDOMEN:abdomen soft, non-tender and normal bowel sounds Musculoskeletal:no cyanosis of digits and no clubbing  PSYCH: alert & oriented x 3 with fluent speech NEURO: no focal motor/sensory deficits BREAST: Deferred.  LABORATORY DATA:  I have reviewed the data as listed Lab Results  Component Value Date   WBC 5.5 12/01/2014   HGB 10.9* 12/01/2014   HCT 33.9* 12/01/2014   MCV 79.6 12/01/2014   PLT 209 12/01/2014   Lab Results  Component Value Date   NA 135* 12/01/2014   K 3.4* 12/01/2014    CL 97 06/20/2014   CO2 28 12/01/2014    RADIOGRAPHIC STUDIES: I have personally reviewed the radiological reports and agreed with the findings in the report. Results are summarized as above  ASSESSMENT AND PLAN:  No problem-specific assessment & plan notes found for this encounter.  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 Today is cycle 1 day 1 of Adriamycin Cytoxan.  I have have again discussed the risks and benefits of chemotherapy including the risks of nausea/ vomiting, risk of infection from low WBC count, fatigue due to chemo or anemia, bruising or bleeding due to low platelets, mouth sores, loss/ change in taste and decreased appetite. Liver and kidney function will be monitored through out chemotherapy as abnormalities in liver and kidney function may be a side effect of treatment.  Chemotoxicities: 1. Baseline echocardiogram shows an ejection fraction of 55-60% 2. Anti-emetic regimen discussed with the patient and her husband. I outlined how to take her anti-emetics.  The patient return in 1 week to recheck counts and assess toxicities.  All questions were answered. The patient knows to call the clinic with any problems, questions or concerns.    Mikey Bussing, NP 2:14 PM

## 2014-12-01 NOTE — Progress Notes (Signed)
Spoke with Dawn at [facility] regarding pt's RX for [employer] sent in to [facility] as requested

## 2014-12-01 NOTE — Telephone Encounter (Signed)
per pof to sch pt appt-sent MW email to move inf to coordinate MD appt-pt to get avs b4 leaving trmt room

## 2014-12-02 ENCOUNTER — Telehealth: Payer: Self-pay

## 2014-12-02 NOTE — Telephone Encounter (Signed)
Pt reports fatigue, constipation (took stool softener and laxative), no vomiting, reports indigestion (taking gaviscon) suggested nausea meds, confirmed no zofran until day 4, reviewed dexamethasone order.  Encouraged pt to call clinic if she has any questions.  Pt voiced understanding.

## 2014-12-02 NOTE — Telephone Encounter (Signed)
-----   Message from Adalberto Cole, RN sent at 12/01/2014  1:21 PM EDT ----- Regarding: Dr. Jeni Salles follow up 1st A/C

## 2014-12-07 ENCOUNTER — Encounter: Payer: Self-pay | Admitting: Hematology and Oncology

## 2014-12-07 ENCOUNTER — Other Ambulatory Visit (HOSPITAL_BASED_OUTPATIENT_CLINIC_OR_DEPARTMENT_OTHER): Payer: Managed Care, Other (non HMO)

## 2014-12-07 ENCOUNTER — Ambulatory Visit (HOSPITAL_BASED_OUTPATIENT_CLINIC_OR_DEPARTMENT_OTHER): Payer: Managed Care, Other (non HMO) | Admitting: Hematology and Oncology

## 2014-12-07 VITALS — BP 136/59 | HR 88 | Temp 97.5°F | Resp 18 | Ht 66.0 in | Wt 117.5 lb

## 2014-12-07 DIAGNOSIS — C50412 Malignant neoplasm of upper-outer quadrant of left female breast: Secondary | ICD-10-CM

## 2014-12-07 DIAGNOSIS — R350 Frequency of micturition: Secondary | ICD-10-CM | POA: Diagnosis not present

## 2014-12-07 DIAGNOSIS — E876 Hypokalemia: Secondary | ICD-10-CM

## 2014-12-07 DIAGNOSIS — F411 Generalized anxiety disorder: Secondary | ICD-10-CM

## 2014-12-07 LAB — COMPREHENSIVE METABOLIC PANEL (CC13)
ALT: 11 U/L (ref 0–55)
AST: 14 U/L (ref 5–34)
Albumin: 3.3 g/dL — ABNORMAL LOW (ref 3.5–5.0)
Alkaline Phosphatase: 71 U/L (ref 40–150)
Anion Gap: 8 mEq/L (ref 3–11)
BUN: 7.9 mg/dL (ref 7.0–26.0)
CO2: 30 mEq/L — ABNORMAL HIGH (ref 22–29)
Calcium: 9.1 mg/dL (ref 8.4–10.4)
Chloride: 94 mEq/L — ABNORMAL LOW (ref 98–109)
Creatinine: 0.5 mg/dL — ABNORMAL LOW (ref 0.6–1.1)
EGFR: >90 mL/min/{1.73_m2} (ref >90)
Glucose: 107 mg/dl (ref 70–140)
Potassium: 3.2 mEq/L — ABNORMAL LOW (ref 3.5–5.1)
Sodium: 133 mEq/L — ABNORMAL LOW (ref 136–145)
Total Bilirubin: 0.44 mg/dL (ref 0.20–1.20)
Total Protein: 6.2 g/dL — ABNORMAL LOW (ref 6.4–8.3)

## 2014-12-07 LAB — CBC WITH DIFFERENTIAL/PLATELET
BASO%: 1.6 % (ref 0.0–2.0)
Basophils Absolute: 0 10*3/uL (ref 0.0–0.1)
EOS%: 3.8 % (ref 0.0–7.0)
Eosinophils Absolute: 0.1 10*3/uL (ref 0.0–0.5)
HCT: 28.3 % — ABNORMAL LOW (ref 34.8–46.6)
HGB: 9.3 g/dL — ABNORMAL LOW (ref 11.6–15.9)
LYMPH%: 18.3 % (ref 14.0–49.7)
MCH: 25.4 pg (ref 25.1–34.0)
MCHC: 32.8 g/dL (ref 31.5–36.0)
MCV: 77.6 fL — ABNORMAL LOW (ref 79.5–101.0)
MONO#: 0 10*3/uL — ABNORMAL LOW (ref 0.1–0.9)
MONO%: 1.2 % (ref 0.0–14.0)
NEUT#: 1.4 10*3/uL — ABNORMAL LOW (ref 1.5–6.5)
NEUT%: 75.1 % (ref 38.4–76.8)
Platelets: 150 10*3/uL (ref 145–400)
RBC: 3.64 10*6/uL — ABNORMAL LOW (ref 3.70–5.45)
RDW: 15.6 % — ABNORMAL HIGH (ref 11.2–14.5)
WBC: 1.9 10*3/uL — ABNORMAL LOW (ref 3.9–10.3)
lymph#: 0.3 10*3/uL — ABNORMAL LOW (ref 0.9–3.3)

## 2014-12-07 LAB — URINALYSIS, MICROSCOPIC - CHCC
Bilirubin (Urine): NEGATIVE
Blood: NEGATIVE
Glucose: NEGATIVE mg/dL
Ketones: NEGATIVE mg/dL
Leukocyte Esterase: NEGATIVE
Nitrite: NEGATIVE
Protein: NEGATIVE mg/dL
RBC / HPF: NEGATIVE (ref 0–2)
Specific Gravity, Urine: 1.005 (ref 1.003–1.035)
Urobilinogen, UR: 0.2 mg/dL (ref 0.2–1)
WBC, UA: NEGATIVE (ref 0–2)
pH: 7.5 (ref 4.6–8.0)

## 2014-12-07 MED ORDER — DIAZEPAM 5 MG PO TABS: 5.0000 mg | ORAL_TABLET | Freq: Four times a day (QID) | ORAL | Status: DC | PRN

## 2014-12-07 NOTE — Assessment & Plan Note (Signed)
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 Neo-adjuvant chemotherapy with Adriamycin and Cytoxan every 2 weeks 4 followed by Abraxane weekly 12 2. Followed by surgery which could be with mastectomy versus lumpectomy (patient may need biopsies to demonstrate extent of disease after neo-adjuvant chemotherapy) 3. Following surgery she may need adjuvant radiation if she undergoes lumpectomy  Current treatment: Cycle 1 day 8 dose dense Adriamycin and Cytoxan Chemotherapy toxicities: 1. Frequent urination: Will check a urinalysis today 2. Bone pain: Related to Neulasta. He recommended that she take Claritin for the next chemotherapy. 3. Neutropenia ANC 1.4: Expected for day 8 chemotherapy. No need of dose changes 4. Normocytic anemia: Hemoglobin 9.3 prior to starting chemotherapy she was anemic with a hemoglobin of 10.6 5. Hypokalemia: Instructed her to take potassium containing foods 6. Hot flashes for 24 hours after chemotherapy  Renewed the prescription for Valium. She does not want to take Ativan or Vicodin. We will discontinue those medications.  I instructed her to take estrogenic replacement every other day to slowly wean her off. Return to clinic in 1 week for cycle 2 of chemotherapy.

## 2014-12-07 NOTE — Progress Notes (Signed)
Patient Care Team: Chipper Herb, MD as PCP - General (Family Medicine) Dr Geanie Berlin (Gynecology) Holley Bouche, NP as Nurse Practitioner (Nurse Practitioner)  DIAGNOSIS: Breast cancer of upper-outer quadrant of left female breast   Staging form: Breast, AJCC 7th Edition     Clinical stage from 11/18/2014: Stage IIB (T3, N0, M0) - Unsigned   SUMMARY OF ONCOLOGIC HISTORY:   Breast cancer of upper-outer quadrant of left female breast   11/09/2014 Mammogram Left breast irregular hypoechoic mass 12:00 position 5.3 x 3.7 x 2.9 cm, left low axilla normal sized lymph nodes   11/09/2014 Initial Diagnosis Left Bx: IDC with DCIS; Grade 3, Er 0%, PR 0%, Her 2 Neg, Ki 67: 70%   11/30/2014 -  Neo-Adjuvant Chemotherapy Dose dense Adriamycin and Cytoxan 4 followed by Abraxane weekly 12    CHIEF COMPLIANT: Cycle 1 day 8 dose dense Adriamycin and Cytoxan  INTERVAL HISTORY: Sherry Bender is a 64 year old above-mentioned history of left breast cancer currently on the adjuvant chemotherapy. Today is cycle 1 day 8 of chemotherapy. She tolerated Adriamycin Cytoxan without any nausea vomiting or bowel issues. She had hot flashes the night after chemotherapy. She also had bone pain with because of Neulasta for 1 or 2 days. She complained of frequency of urination for the last weekend. This is kept her from falling asleep. She does not want to take any Vicodin or Ativan. She gets a lot of help from Valium for both anxiety and sleep.  REVIEW OF SYSTEMS:   Constitutional: Denies fevers, chills or abnormal weight loss Eyes: Denies blurriness of vision Ears, nose, mouth, throat, and face: Denies mucositis or sore throat Respiratory: Denies cough, dyspnea or wheezes Cardiovascular: Denies palpitation, chest discomfort or lower extremity swelling Gastrointestinal:  Denies nausea, heartburn or change in bowel habits Skin: Denies abnormal skin rashes Lymphatics: Denies new lymphadenopathy or easy  bruising Neurological:Denies numbness, tingling or new weaknesses Behavioral/Psych: Difficulty with sleep because of increased frequency of urination   All other systems were reviewed with the patient and are negative.  I have reviewed the past medical history, past surgical history, social history and family history with the patient and they are unchanged from previous note.  ALLERGIES:  is allergic to codeine.  MEDICATIONS:  Current Outpatient Prescriptions  Medication Sig Dispense Refill  . dexamethasone (DECADRON) 4 MG tablet Take 1 tablets by mouth once a day on the day after chemotherapy and then take 1 tablets two times a day for 2 days. Take with food. 30 tablet 1  . dexlansoprazole (DEXILANT) 60 MG capsule Take 1 capsule (60 mg total) by mouth daily. 30 capsule 4  . diazepam (VALIUM) 5 MG tablet Take 1 tablet (5 mg total) by mouth every 6 (six) hours as needed for anxiety. 30 tablet 0  . estradiol (ESTRACE) 1 MG tablet Take 1 mg by mouth daily.      . hydrochlorothiazide (HYDRODIURIL) 25 MG tablet Take 1 tablet (25 mg total) by mouth daily. 90 tablet 0  . lidocaine-prilocaine (EMLA) cream Apply to affected area once 30 g 3  . Multiple Vitamin (MULTIVITAMIN) tablet Take 1 tablet by mouth daily.    . ondansetron (ZOFRAN) 8 MG tablet Take 1 tablet (8 mg total) by mouth 2 (two) times daily as needed. Start on the third day after chemotherapy. 30 tablet 1  . Phenazopyridine HCl (AZO TABS PO) Take 1 tablet by mouth.    . prochlorperazine (COMPAZINE) 10 MG tablet Take 1 tablet (10 mg total) by  mouth every 6 (six) hours as needed (Nausea or vomiting). 30 tablet 1   No current facility-administered medications for this visit.    PHYSICAL EXAMINATION: ECOG PERFORMANCE STATUS: 1 - Symptomatic but completely ambulatory  Filed Vitals:   12/07/14 1123  BP: 136/59  Pulse: 88  Temp: 97.5 F (36.4 C)  Resp: 18   Filed Weights   12/07/14 1123  Weight: 117 lb 8 oz (53.298 kg)     GENERAL:alert, no distress and comfortable SKIN: skin color, texture, turgor are normal, no rashes or significant lesions EYES: normal, Conjunctiva are pink and non-injected, sclera clear OROPHARYNX:no exudate, no erythema and lips, buccal mucosa, and tongue normal  NECK: supple, thyroid normal size, non-tender, without nodularity LYMPH:  no palpable lymphadenopathy in the cervical, axillary or inguinal LUNGS: clear to auscultation and percussion with normal breathing effort HEART: regular rate & rhythm and no murmurs and no lower extremity edema ABDOMEN:abdomen soft, non-tender and normal bowel sounds Musculoskeletal:no cyanosis of digits and no clubbing  NEURO: alert & oriented x 3 with fluent speech, no focal motor/sensory deficits  LABORATORY DATA:  I have reviewed the data as listed   Chemistry      Component Value Date/Time   NA 133* 12/07/2014 1046   NA 133* 06/20/2014 2042   K 3.2* 12/07/2014 1046   K 3.4* 06/20/2014 2042   CL 97 06/20/2014 2042   CO2 30* 12/07/2014 1046   CO2 22 06/20/2014 2042   BUN 7.9 12/07/2014 1046   BUN 8 06/20/2014 2042   CREATININE 0.5* 12/07/2014 1046   CREATININE 0.37* 06/20/2014 2042      Component Value Date/Time   CALCIUM 9.1 12/07/2014 1046   CALCIUM 9.1 06/20/2014 2042   ALKPHOS 71 12/07/2014 1046   AST 14 12/07/2014 1046   ALT 11 12/07/2014 1046   BILITOT 0.44 12/07/2014 1046       Lab Results  Component Value Date   WBC 1.9* 12/07/2014   HGB 9.3* 12/07/2014   HCT 28.3* 12/07/2014   MCV 77.6* 12/07/2014   PLT 150 12/07/2014   NEUTROABS 1.4* 12/07/2014     RADIOGRAPHIC STUDIES: I have personally reviewed the radiology reports and agreed with their findings. No results found.   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 Neo-adjuvant chemotherapy with Adriamycin and Cytoxan every 2 weeks 4 followed by Abraxane weekly 12 2. Followed by surgery which could be with mastectomy versus lumpectomy (patient may need biopsies to demonstrate extent of disease after neo-adjuvant chemotherapy) 3. Following surgery she may need adjuvant radiation if she undergoes lumpectomy  Current treatment: Cycle 1 day 8 dose dense Adriamycin and Cytoxan Chemotherapy toxicities: 1. Frequent urination: Will check a urinalysis today 2. Bone pain: Related to Neulasta. He recommended that she take Claritin for the next chemotherapy. 3. Neutropenia ANC 1.4: Expected for day 8 chemotherapy. No need of dose changes 4. Normocytic anemia: Hemoglobin 9.3 prior to starting chemotherapy she was anemic with a hemoglobin of 10.6 5. Hypokalemia: Instructed her to take potassium containing foods 6. Hot flashes for 24 hours after chemotherapy  Renewed the prescription for Valium. She does not want to take Ativan or Vicodin. We will discontinue those  medications.  I instructed her to take estrogenic replacement every other day to slowly wean her off. Return to clinic in 1 week for cycle 2 of chemotherapy.    Orders Placed This Encounter  Procedures  . Urine culture    Standing Status: Future     Number of Occurrences: 1     Standing Expiration Date: 12/07/2015  . Urinalysis, Microscopic - [facility]    Standing Status: Future     Number of Occurrences: 1     Standing Expiration Date: 12/07/2015   The patient has a good understanding of the overall plan. she agrees with it. she will call with any problems that may develop before the next visit here.   Rulon Eisenmenger, MD

## 2014-12-08 ENCOUNTER — Telehealth: Payer: Self-pay

## 2014-12-08 LAB — URINE CULTURE

## 2014-12-08 NOTE — Telephone Encounter (Signed)
Pt enrolled in [place].  Savings card printed and mailed to patient.

## 2014-12-09 ENCOUNTER — Telehealth: Payer: Self-pay | Admitting: *Deleted

## 2014-12-09 NOTE — Telephone Encounter (Signed)
TC from patient regarding financial assistance for her dexilant. Informed her that dexilant savings card has been mailed to her as of 12/08/14. She should be receiving it in the next day or so. Advised to present to [facility] when she picks up her prescription.  She verbalized understanding.

## 2014-12-14 NOTE — Assessment & Plan Note (Signed)
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 Neo-adjuvant chemotherapy with Adriamycin and Cytoxan every 2 weeks 4 followed by Abraxane weekly 12 2. Followed by surgery which could be with mastectomy versus lumpectomy (patient may need biopsies to demonstrate extent of disease after neo-adjuvant chemotherapy) 3. Following surgery she may need adjuvant radiation if she undergoes lumpectomy  Current treatment: Cycle 2 day 1 dose dense Adriamycin and Cytoxan Chemotherapy toxicities: 1. Frequent urination: Will check a urinalysis today 2. Bone pain: Related to Neulasta. He recommended that she take Claritin for the next chemotherapy. 3. Neutropenia ANC 1.4: Expected for day 8 chemotherapy. No need of dose changes 4. Normocytic anemia: Hemoglobin 9.3 prior to starting chemotherapy she was anemic with a hemoglobin of 10.6 5. Hypokalemia: Instructed her to take potassium containing foods 6. Hot flashes for 24 hours after chemotherapy  Renewed the prescription for Valium. She does not want to take Ativan or Vicodin. We will discontinue those medications.  I instructed her to take estrogenic replacement every other day to slowly wean her off. Return to clinic in 2 week for cycle 3 of chemotherapy.  

## 2014-12-15 ENCOUNTER — Ambulatory Visit (HOSPITAL_BASED_OUTPATIENT_CLINIC_OR_DEPARTMENT_OTHER): Payer: Managed Care, Other (non HMO)

## 2014-12-15 ENCOUNTER — Ambulatory Visit (HOSPITAL_BASED_OUTPATIENT_CLINIC_OR_DEPARTMENT_OTHER): Payer: Managed Care, Other (non HMO) | Admitting: Hematology and Oncology

## 2014-12-15 ENCOUNTER — Other Ambulatory Visit (HOSPITAL_BASED_OUTPATIENT_CLINIC_OR_DEPARTMENT_OTHER): Payer: Managed Care, Other (non HMO)

## 2014-12-15 ENCOUNTER — Encounter: Payer: Self-pay | Admitting: Hematology and Oncology

## 2014-12-15 ENCOUNTER — Telehealth: Payer: Self-pay | Admitting: Hematology and Oncology

## 2014-12-15 ENCOUNTER — Ambulatory Visit: Payer: Managed Care, Other (non HMO)

## 2014-12-15 VITALS — BP 151/66 | HR 111 | Temp 97.8°F | Resp 18 | Ht 66.0 in | Wt 116.1 lb

## 2014-12-15 DIAGNOSIS — D649 Anemia, unspecified: Secondary | ICD-10-CM

## 2014-12-15 DIAGNOSIS — Z5111 Encounter for antineoplastic chemotherapy: Secondary | ICD-10-CM | POA: Diagnosis not present

## 2014-12-15 DIAGNOSIS — E876 Hypokalemia: Secondary | ICD-10-CM

## 2014-12-15 DIAGNOSIS — M898X9 Other specified disorders of bone, unspecified site: Secondary | ICD-10-CM | POA: Diagnosis not present

## 2014-12-15 DIAGNOSIS — C50412 Malignant neoplasm of upper-outer quadrant of left female breast: Secondary | ICD-10-CM

## 2014-12-15 DIAGNOSIS — C50812 Malignant neoplasm of overlapping sites of left female breast: Secondary | ICD-10-CM

## 2014-12-15 DIAGNOSIS — R350 Frequency of micturition: Secondary | ICD-10-CM

## 2014-12-15 DIAGNOSIS — D709 Neutropenia, unspecified: Secondary | ICD-10-CM | POA: Diagnosis not present

## 2014-12-15 LAB — COMPREHENSIVE METABOLIC PANEL (CC13)
ALT: 12 U/L (ref 0–55)
AST: 18 U/L (ref 5–34)
Albumin: 3.6 g/dL (ref 3.5–5.0)
Alkaline Phosphatase: 73 U/L (ref 40–150)
Anion Gap: 10 mEq/L (ref 3–11)
BUN: 5.9 mg/dL — ABNORMAL LOW (ref 7.0–26.0)
CO2: 29 mEq/L (ref 22–29)
Calcium: 9 mg/dL (ref 8.4–10.4)
Chloride: 95 mEq/L — ABNORMAL LOW (ref 98–109)
Creatinine: 0.5 mg/dL — ABNORMAL LOW (ref 0.6–1.1)
EGFR: >90 mL/min/{1.73_m2} (ref >90)
Glucose: 94 mg/dl (ref 70–140)
Potassium: 3 mEq/L — CL (ref 3.5–5.1)
Sodium: 134 mEq/L — ABNORMAL LOW (ref 136–145)
Total Bilirubin: <0.2 mg/dL (ref 0.20–1.20)
Total Protein: 6.7 g/dL (ref 6.4–8.3)

## 2014-12-15 LAB — CBC WITH DIFFERENTIAL/PLATELET
BASO%: 0.4 % (ref 0.0–2.0)
Basophils Absolute: 0 10*3/uL (ref 0.0–0.1)
EOS%: 0.1 % (ref 0.0–7.0)
Eosinophils Absolute: 0 10*3/uL (ref 0.0–0.5)
HCT: 31.4 % — ABNORMAL LOW (ref 34.8–46.6)
HGB: 10.2 g/dL — ABNORMAL LOW (ref 11.6–15.9)
LYMPH%: 11.1 % — ABNORMAL LOW (ref 14.0–49.7)
MCH: 25.8 pg (ref 25.1–34.0)
MCHC: 32.5 g/dL (ref 31.5–36.0)
MCV: 79.5 fL (ref 79.5–101.0)
MONO#: 0.7 10*3/uL (ref 0.1–0.9)
MONO%: 8.4 % (ref 0.0–14.0)
NEUT#: 6.5 10*3/uL (ref 1.5–6.5)
NEUT%: 80 % — ABNORMAL HIGH (ref 38.4–76.8)
Platelets: 199 10*3/uL (ref 145–400)
RBC: 3.95 10*6/uL (ref 3.70–5.45)
RDW: 15.7 % — ABNORMAL HIGH (ref 11.2–14.5)
WBC: 8.1 10*3/uL (ref 3.9–10.3)
lymph#: 0.9 10*3/uL (ref 0.9–3.3)

## 2014-12-15 MED ORDER — SODIUM CHLORIDE 0.9 % IV SOLN
Freq: Once | INTRAVENOUS | Status: AC
  Administered 2014-12-15: 14:00:00 via INTRAVENOUS

## 2014-12-15 MED ORDER — DOXORUBICIN HCL CHEMO IV INJECTION 2 MG/ML
60.0000 mg/m2 | Freq: Once | INTRAVENOUS | Status: AC
  Administered 2014-12-15: 96 mg via INTRAVENOUS
  Filled 2014-12-15: qty 48

## 2014-12-15 MED ORDER — PALONOSETRON HCL INJECTION 0.25 MG/5ML
0.2500 mg | Freq: Once | INTRAVENOUS | Status: AC
  Administered 2014-12-15: 0.25 mg via INTRAVENOUS

## 2014-12-15 MED ORDER — CYCLOPHOSPHAMIDE CHEMO INJECTION 1 GM
600.0000 mg/m2 | Freq: Once | INTRAMUSCULAR | Status: AC
  Administered 2014-12-15: 960 mg via INTRAVENOUS
  Filled 2014-12-15: qty 48

## 2014-12-15 MED ORDER — PEGFILGRASTIM 6 MG/0.6ML ~~LOC~~ PSKT
6.0000 mg | PREFILLED_SYRINGE | Freq: Once | SUBCUTANEOUS | Status: AC
  Administered 2014-12-15: 6 mg via SUBCUTANEOUS
  Filled 2014-12-15: qty 0.6

## 2014-12-15 MED ORDER — HEPARIN SOD (PORK) LOCK FLUSH 100 UNIT/ML IV SOLN
500.0000 [IU] | Freq: Once | INTRAVENOUS | Status: AC | PRN
  Administered 2014-12-15: 500 [IU]
  Filled 2014-12-15: qty 5

## 2014-12-15 MED ORDER — PALONOSETRON HCL INJECTION 0.25 MG/5ML
INTRAVENOUS | Status: AC
  Filled 2014-12-15: qty 5

## 2014-12-15 MED ORDER — POTASSIUM CHLORIDE ER 10 MEQ PO CPCR: 20.0000 meq | ORAL_CAPSULE | Freq: Two times a day (BID) | ORAL | Status: DC

## 2014-12-15 MED ORDER — SODIUM CHLORIDE 0.9 % IV SOLN
Freq: Once | INTRAVENOUS | Status: AC
  Administered 2014-12-15: 14:00:00 via INTRAVENOUS
  Filled 2014-12-15: qty 5

## 2014-12-15 MED ORDER — SODIUM CHLORIDE 0.9 % IJ SOLN
10.0000 mL | INTRAMUSCULAR | Status: DC | PRN
  Administered 2014-12-15: 10 mL
  Filled 2014-12-15: qty 10

## 2014-12-15 NOTE — Progress Notes (Signed)
Patient Care Team: Chipper Herb, MD as PCP - General (Family Medicine) Dr Geanie Berlin (Gynecology) Holley Bouche, NP as Nurse Practitioner (Nurse Practitioner)  DIAGNOSIS: Breast cancer of upper-outer quadrant of left female breast   Staging form: Breast, AJCC 7th Edition     Clinical stage from 11/18/2014: Stage IIB (T3, N0, M0) - Unsigned   SUMMARY OF ONCOLOGIC HISTORY:   Breast cancer of upper-outer quadrant of left female breast   11/09/2014 Mammogram Left breast irregular hypoechoic mass 12:00 position 5.3 x 3.7 x 2.9 cm, left low axilla normal sized lymph nodes   11/09/2014 Initial Diagnosis Left Bx: IDC with DCIS; Grade 3, Er 0%, PR 0%, Her 2 Neg, Ki 67: 70%   11/30/2014 -  Neo-Adjuvant Chemotherapy Dose dense Adriamycin and Cytoxan 4 followed by Abraxane weekly 12    CHIEF COMPLIANT: Cycle 2 dose dense Adriamycin and Cytoxan  INTERVAL HISTORY: Sherry Bender is a 64 year old with above-mentioned history of left breast cancer currently neo-adjuvant chemotherapy with dose dense Adriamycin and Cytoxan. Today is cycle 2 of treatment. She done well over the past week and has been eating well. Denies any nausea or vomiting. She complains of scalp itching and hair loss. She also complains of constipation and hemorrhoids.  REVIEW OF SYSTEMS:   Constitutional: Denies fevers, chills or abnormal weight loss Eyes: Denies blurriness of vision Ears, nose, mouth, throat, and face: Denies mucositis or sore throat Respiratory: Denies cough, dyspnea or wheezes Cardiovascular: Denies palpitation, chest discomfort or lower extremity swelling Gastrointestinal:  Denies nausea, heartburn or change in bowel habits Skin: Denies abnormal skin rashes Lymphatics: Denies new lymphadenopathy or easy bruising Neurological:Denies numbness, tingling or new weaknesses Behavioral/Psych: Mood is stable, no new changes  All other systems were reviewed with the patient and are negative.  I have reviewed the past  medical history, past surgical history, social history and family history with the patient and they are unchanged from previous note.  ALLERGIES:  is allergic to codeine.  MEDICATIONS:  Current Outpatient Prescriptions  Medication Sig Dispense Refill  . dexamethasone (DECADRON) 4 MG tablet Take 1 tablets by mouth once a day on the day after chemotherapy and then take 1 tablets two times a day for 2 days. Take with food. 30 tablet 1  . dexlansoprazole (DEXILANT) 60 MG capsule Take 1 capsule (60 mg total) by mouth daily. 30 capsule 4  . diazepam (VALIUM) 5 MG tablet Take 1 tablet (5 mg total) by mouth every 6 (six) hours as needed for anxiety. 30 tablet 0  . estradiol (ESTRACE) 1 MG tablet Take 1 mg by mouth daily.      . hydrochlorothiazide (HYDRODIURIL) 25 MG tablet Take 1 tablet (25 mg total) by mouth daily. 90 tablet 0  . HYDROcodone-acetaminophen (NORCO/VICODIN) 5-325 MG per tablet     . lidocaine-prilocaine (EMLA) cream Apply to affected area once 30 g 3  . LORazepam (ATIVAN) 0.5 MG tablet Take 0.5 mg by mouth at bedtime.  0  . Multiple Vitamin (MULTIVITAMIN) tablet Take 1 tablet by mouth daily.    . ondansetron (ZOFRAN) 8 MG tablet Take 1 tablet (8 mg total) by mouth 2 (two) times daily as needed. Start on the third day after chemotherapy. 30 tablet 1  . Phenazopyridine HCl (AZO TABS PO) Take 1 tablet by mouth.    . prochlorperazine (COMPAZINE) 10 MG tablet Take 1 tablet (10 mg total) by mouth every 6 (six) hours as needed (Nausea or vomiting). 30 tablet 1   No current  facility-administered medications for this visit.    PHYSICAL EXAMINATION: ECOG PERFORMANCE STATUS: 1 - Symptomatic but completely ambulatory  Filed Vitals:   12/15/14 1246  BP: 151/66  Pulse: 111  Temp: 97.8 F (36.6 C)  Resp: 18   Filed Weights   12/15/14 1246  Weight: 116 lb 1.6 oz (52.663 kg)    GENERAL:alert, no distress and comfortable SKIN: skin color, texture, turgor are normal, no rashes or  significant lesions EYES: normal, Conjunctiva are pink and non-injected, sclera clear OROPHARYNX:no exudate, no erythema and lips, buccal mucosa, and tongue normal  NECK: supple, thyroid normal size, non-tender, without nodularity LYMPH:  no palpable lymphadenopathy in the cervical, axillary or inguinal LUNGS: clear to auscultation and percussion with normal breathing effort HEART: regular rate & rhythm and no murmurs and no lower extremity edema ABDOMEN:abdomen soft, non-tender and normal bowel sounds Musculoskeletal:no cyanosis of digits and no clubbing  NEURO: alert & oriented x 3 with fluent speech, no focal motor/sensory deficits  LABORATORY DATA:  I have reviewed the data as listed   Chemistry      Component Value Date/Time   NA 134* 12/15/2014 1235   NA 133* 06/20/2014 2042   K 3.0* 12/15/2014 1235   K 3.4* 06/20/2014 2042   CL 97 06/20/2014 2042   CO2 29 12/15/2014 1235   CO2 22 06/20/2014 2042   BUN 5.9* 12/15/2014 1235   BUN 8 06/20/2014 2042   CREATININE 0.5* 12/15/2014 1235   CREATININE 0.37* 06/20/2014 2042      Component Value Date/Time   CALCIUM 9.0 12/15/2014 1235   CALCIUM 9.1 06/20/2014 2042   ALKPHOS 73 12/15/2014 1235   AST 18 12/15/2014 1235   ALT 12 12/15/2014 1235   BILITOT <0.20 12/15/2014 1235       Lab Results  Component Value Date   WBC 8.1 12/15/2014   HGB 10.2* 12/15/2014   HCT 31.4* 12/15/2014   MCV 79.5 12/15/2014   PLT 199 12/15/2014   NEUTROABS 6.5 12/15/2014   ASSESSMENT & PLAN:  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 Neo-adjuvant chemotherapy with Adriamycin and Cytoxan every 2 weeks 4 followed by Abraxane weekly 12 2. Followed by surgery which could be  with mastectomy versus lumpectomy (patient may need biopsies to demonstrate extent of disease after neo-adjuvant chemotherapy) 3. Following surgery she may need adjuvant radiation if she undergoes lumpectomy  Current treatment: Cycle 2 day 1 dose dense Adriamycin and Cytoxan Chemotherapy toxicities: 1. Frequent urination: Will check a urinalysis today 2. Bone pain: Related to Neulasta. He recommended that she take Claritin for the next chemotherapy. 3. Neutropenia ANC 1.4: Expected for day 8 chemotherapy. No need of dose changes 4. Normocytic anemia: Hemoglobin 9.3 prior to starting chemotherapy she was anemic with a hemoglobin of 10.6 5. Hypokalemia: Potassium is 3 and I ordered oral potassium replacement 20 mEq daily 12/15/2014 6. Hot flashes for 24 hours after chemotherapy  I instructed her to take estrogenic replacement every other day to slowly wean her off. Return to clinic in 2 week for cycle 3 of chemotherapy.  No orders of the defined types were placed in this encounter.   The patient has a good understanding of the overall plan.  she agrees with it. she will call with any problems that may develop before the next visit here.   Rulon Eisenmenger, MD

## 2014-12-15 NOTE — Telephone Encounter (Signed)
Gave avs & calendar for July °

## 2014-12-15 NOTE — Patient Instructions (Signed)
[facility] Discharge Instructions for Patients Receiving Chemotherapy  Today you received the following chemotherapy agents: Adriamycin/Cytoxan.  To help prevent nausea and vomiting after your treatment, we encourage you to take your nausea medication as directed.    If you develop nausea and vomiting that is not controlled by your nausea medication, call the clinic.   BELOW ARE SYMPTOMS THAT SHOULD BE REPORTED IMMEDIATELY:  *FEVER GREATER THAN 100.5 F  *CHILLS WITH OR WITHOUT FEVER  NAUSEA AND VOMITING THAT IS NOT CONTROLLED WITH YOUR NAUSEA MEDICATION  *UNUSUAL SHORTNESS OF BREATH  *UNUSUAL BRUISING OR BLEEDING  TENDERNESS IN MOUTH AND THROAT WITH OR WITHOUT PRESENCE OF ULCERS  *URINARY PROBLEMS  *BOWEL PROBLEMS  UNUSUAL RASH Items with * indicate a potential emergency and should be followed up as soon as possible.  Feel free to call the clinic you have any questions or concerns. The clinic phone number is [phone].  Please show the CHEMO ALERT CARD at check-in to the Emergency Department and triage nurse.   

## 2014-12-22 ENCOUNTER — Telehealth: Payer: Self-pay | Admitting: *Deleted

## 2014-12-22 NOTE — Telephone Encounter (Signed)
Received pager message from patient "dizzy, sweats, chills".  TC to patient. She sates these things happened on Saturday after going out for lunch-had a large tuna sub sandwich. After eating she said she felt a bit dizzy, cols sweats, blurry eyes and some nausea. Denies fever. She states she rested, cooled off but did not take any anti-nausea meds.  Sunday night she had some leg cramps but they resolved. She is currently on replacement KCL for low K+ of 3.0 on 12/15/14. Reviewed pt's medications with her and advised her to take her nausea meds when she feels the slightest bit nauseated. She voiced understanding. She is taking in adequate fluids and eating well. Encouraged smaller, more frequent meals, keep up with her fluids.  Again she voiced understanding. Advised light exercise, massage and warm baths/shower for leg cramps.  Pt sees Dr. Lindi Adie next week, has labs and chemo also, on 12/29/14.  Encouraged to call back should symptoms return and she is unable to manage at home.

## 2014-12-28 NOTE — Assessment & Plan Note (Signed)
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 Neo-adjuvant chemotherapy with Adriamycin and Cytoxan every 2 weeks 4 followed by Abraxane weekly 12 2. Followed by surgery which could be with mastectomy versus lumpectomy (patient may need biopsies to demonstrate extent of disease after neo-adjuvant chemotherapy) 3. Following surgery she may need adjuvant radiation if she undergoes lumpectomy  Current treatment: Cycle 3 day 1 dose dense Adriamycin and Cytoxan Chemotherapy toxicities: 1. Frequent urination: Will check a urinalysis today 2. Bone pain: Related to Neulasta. He recommended that she take Claritin for the next chemotherapy. 3. Neutropenia ANC 1.4: Expected for day 8 chemotherapy. No need of dose changes 4. Normocytic anemia: Hemoglobin 9.3 prior to starting chemotherapy she was anemic with a hemoglobin of 10.6 5. Hypokalemia: Potassium is 3 and I ordered oral potassium replacement 20 mEq daily 12/15/2014 6. Hot flashes for 24 hours after chemotherapy  I instructed her to take estrogenic replacement every other day to slowly wean her off. Return to clinic in 2 week for cycle 4 of chemotherapy.

## 2014-12-29 ENCOUNTER — Other Ambulatory Visit (HOSPITAL_BASED_OUTPATIENT_CLINIC_OR_DEPARTMENT_OTHER): Payer: Managed Care, Other (non HMO)

## 2014-12-29 ENCOUNTER — Ambulatory Visit (HOSPITAL_BASED_OUTPATIENT_CLINIC_OR_DEPARTMENT_OTHER): Payer: Managed Care, Other (non HMO) | Admitting: Hematology and Oncology

## 2014-12-29 ENCOUNTER — Ambulatory Visit (HOSPITAL_BASED_OUTPATIENT_CLINIC_OR_DEPARTMENT_OTHER): Payer: Managed Care, Other (non HMO)

## 2014-12-29 ENCOUNTER — Encounter: Payer: Self-pay | Admitting: *Deleted

## 2014-12-29 ENCOUNTER — Encounter: Payer: Self-pay | Admitting: Hematology and Oncology

## 2014-12-29 VITALS — BP 150/66 | HR 86 | Temp 97.7°F | Resp 18 | Ht 66.0 in | Wt 113.1 lb

## 2014-12-29 DIAGNOSIS — C50812 Malignant neoplasm of overlapping sites of left female breast: Secondary | ICD-10-CM | POA: Diagnosis not present

## 2014-12-29 DIAGNOSIS — Z5111 Encounter for antineoplastic chemotherapy: Secondary | ICD-10-CM | POA: Diagnosis not present

## 2014-12-29 DIAGNOSIS — Z5189 Encounter for other specified aftercare: Secondary | ICD-10-CM

## 2014-12-29 DIAGNOSIS — C50412 Malignant neoplasm of upper-outer quadrant of left female breast: Secondary | ICD-10-CM

## 2014-12-29 DIAGNOSIS — R21 Rash and other nonspecific skin eruption: Secondary | ICD-10-CM

## 2014-12-29 LAB — CBC WITH DIFFERENTIAL/PLATELET
BASO%: 0.3 % (ref 0.0–2.0)
Basophils Absolute: 0 10*3/uL (ref 0.0–0.1)
EOS%: 0.6 % (ref 0.0–7.0)
Eosinophils Absolute: 0 10*3/uL (ref 0.0–0.5)
HCT: 29.9 % — ABNORMAL LOW (ref 34.8–46.6)
HGB: 9.8 g/dL — ABNORMAL LOW (ref 11.6–15.9)
LYMPH%: 5.6 % — ABNORMAL LOW (ref 14.0–49.7)
MCH: 26.3 pg (ref 25.1–34.0)
MCHC: 32.9 g/dL (ref 31.5–36.0)
MCV: 79.8 fL (ref 79.5–101.0)
MONO#: 0.6 10*3/uL (ref 0.1–0.9)
MONO%: 8.1 % (ref 0.0–14.0)
NEUT#: 6 10*3/uL (ref 1.5–6.5)
NEUT%: 85.4 % — ABNORMAL HIGH (ref 38.4–76.8)
Platelets: 166 10*3/uL (ref 145–400)
RBC: 3.75 10*6/uL (ref 3.70–5.45)
RDW: 16.4 % — ABNORMAL HIGH (ref 11.2–14.5)
WBC: 7 10*3/uL (ref 3.9–10.3)
lymph#: 0.4 10*3/uL — ABNORMAL LOW (ref 0.9–3.3)

## 2014-12-29 LAB — COMPREHENSIVE METABOLIC PANEL (CC13)
ALT: 13 U/L (ref 0–55)
AST: 16 U/L (ref 5–34)
Albumin: 3.5 g/dL (ref 3.5–5.0)
Alkaline Phosphatase: 73 U/L (ref 40–150)
Anion Gap: 9 mEq/L (ref 3–11)
BUN: 6.6 mg/dL — ABNORMAL LOW (ref 7.0–26.0)
CO2: 29 mEq/L (ref 22–29)
Calcium: 9.4 mg/dL (ref 8.4–10.4)
Chloride: 99 mEq/L (ref 98–109)
Creatinine: 0.5 mg/dL — ABNORMAL LOW (ref 0.6–1.1)
EGFR: >90 mL/min/{1.73_m2} (ref >90)
Glucose: 92 mg/dl (ref 70–140)
Potassium: 3.4 mEq/L — ABNORMAL LOW (ref 3.5–5.1)
Sodium: 136 mEq/L (ref 136–145)
Total Bilirubin: <0.2 mg/dL (ref 0.20–1.20)
Total Protein: 6.2 g/dL — ABNORMAL LOW (ref 6.4–8.3)

## 2014-12-29 MED ORDER — PEGFILGRASTIM 6 MG/0.6ML ~~LOC~~ PSKT
6.0000 mg | PREFILLED_SYRINGE | Freq: Once | SUBCUTANEOUS | Status: AC
  Administered 2014-12-29: 6 mg via SUBCUTANEOUS
  Filled 2014-12-29: qty 0.6

## 2014-12-29 MED ORDER — SODIUM CHLORIDE 0.9 % IV SOLN
600.0000 mg/m2 | Freq: Once | INTRAVENOUS | Status: AC
  Administered 2014-12-29: 960 mg via INTRAVENOUS
  Filled 2014-12-29: qty 48

## 2014-12-29 MED ORDER — PALONOSETRON HCL INJECTION 0.25 MG/5ML
INTRAVENOUS | Status: AC
  Filled 2014-12-29: qty 5

## 2014-12-29 MED ORDER — HEPARIN SOD (PORK) LOCK FLUSH 100 UNIT/ML IV SOLN
500.0000 [IU] | Freq: Once | INTRAVENOUS | Status: AC | PRN
  Administered 2014-12-29: 500 [IU]
  Filled 2014-12-29: qty 5

## 2014-12-29 MED ORDER — PALONOSETRON HCL INJECTION 0.25 MG/5ML
0.2500 mg | Freq: Once | INTRAVENOUS | Status: AC
  Administered 2014-12-29: 0.25 mg via INTRAVENOUS

## 2014-12-29 MED ORDER — SODIUM CHLORIDE 0.9 % IJ SOLN
10.0000 mL | INTRAMUSCULAR | Status: DC | PRN
  Administered 2014-12-29: 10 mL
  Filled 2014-12-29: qty 10

## 2014-12-29 MED ORDER — DOXORUBICIN HCL CHEMO IV INJECTION 2 MG/ML
60.0000 mg/m2 | Freq: Once | INTRAVENOUS | Status: AC
  Administered 2014-12-29: 96 mg via INTRAVENOUS
  Filled 2014-12-29: qty 48

## 2014-12-29 MED ORDER — SODIUM CHLORIDE 0.9 % IV SOLN
Freq: Once | INTRAVENOUS | Status: AC
  Administered 2014-12-29: 10:00:00 via INTRAVENOUS
  Filled 2014-12-29: qty 5

## 2014-12-29 MED ORDER — SODIUM CHLORIDE 0.9 % IV SOLN
Freq: Once | INTRAVENOUS | Status: AC
  Administered 2014-12-29: 10:00:00 via INTRAVENOUS

## 2014-12-29 NOTE — Patient Instructions (Signed)
[facility] Discharge Instructions for Patients Receiving Chemotherapy  Today you received the following chemotherapy agents: Adriamycin/Cytoxan.  To help prevent nausea and vomiting after your treatment, we encourage you to take your nausea medication as directed.    If you develop nausea and vomiting that is not controlled by your nausea medication, call the clinic.   BELOW ARE SYMPTOMS THAT SHOULD BE REPORTED IMMEDIATELY:  *FEVER GREATER THAN 100.5 F  *CHILLS WITH OR WITHOUT FEVER  NAUSEA AND VOMITING THAT IS NOT CONTROLLED WITH YOUR NAUSEA MEDICATION  *UNUSUAL SHORTNESS OF BREATH  *UNUSUAL BRUISING OR BLEEDING  TENDERNESS IN MOUTH AND THROAT WITH OR WITHOUT PRESENCE OF ULCERS  *URINARY PROBLEMS  *BOWEL PROBLEMS  UNUSUAL RASH Items with * indicate a potential emergency and should be followed up as soon as possible.  Feel free to call the clinic you have any questions or concerns. The clinic phone number is [phone].  Please show the CHEMO ALERT CARD at check-in to the Emergency Department and triage nurse.   

## 2014-12-29 NOTE — Progress Notes (Signed)
Patient Care Team: Chipper Herb, MD as PCP - General (Family Medicine) Dr Geanie Berlin (Gynecology) Holley Bouche, NP as Nurse Practitioner (Nurse Practitioner)  DIAGNOSIS: Breast cancer of upper-outer quadrant of left female breast   Staging form: Breast, AJCC 7th Edition     Clinical stage from 11/18/2014: Stage IIB (T3, N0, M0) - Unsigned   SUMMARY OF ONCOLOGIC HISTORY:   Breast cancer of upper-outer quadrant of left female breast   11/09/2014 Mammogram Left breast irregular hypoechoic mass 12:00 position 5.3 x 3.7 x 2.9 cm, left low axilla normal sized lymph nodes   11/09/2014 Initial Diagnosis Left Bx: IDC with DCIS; Grade 3, Er 0%, PR 0%, Her 2 Neg, Ki 67: 70%   11/30/2014 -  Neo-Adjuvant Chemotherapy Dose dense Adriamycin and Cytoxan 4 followed by Abraxane weekly 12    CHIEF COMPLIANT:  Cycle 3 of Adriamycin and Cytoxan  INTERVAL HISTORY: Sherry Bender is a  64 year old with above-mentioned history of left breast cancer currently in the adjuvant chemotherapy. She is on with cycle 3 of dose dense Adriamycin and Cytoxan. She is complaining of rash on her hands which is causing her itching sensation. She is using a lotion which is been helping her. She also complains of dripping nose as well as tearing in the eyes periodically. Denies any nausea or vomiting. Denies any frequency of urination. Denies any fevers or chills. She has lost some weight but appears to be eating better protein. She was low on potassium and was prescribed potassium twice a day. Potassium is up to 3.4 today.  REVIEW OF SYSTEMS:   Constitutional: Denies fevers, chills or abnormal weight loss Eyes: Denies blurriness of vision Ears, nose, mouth, throat, and face: Denies mucositis or sore throat Respiratory: Denies cough, dyspnea or wheezes Cardiovascular: Denies palpitation, chest discomfort or lower extremity swelling Gastrointestinal:  Denies nausea, heartburn or change in bowel habits Skin: Denies abnormal skin  rashes Lymphatics: Denies new lymphadenopathy or easy bruising Neurological:Denies numbness, tingling or new weaknesses Behavioral/Psych: Mood is stable, no new changes   breast: The mass feels much smaller All other systems were reviewed with the patient and are negative.  I have reviewed the past medical history, past surgical history, social history and family history with the patient and they are unchanged from previous note.  ALLERGIES:  is allergic to codeine.  MEDICATIONS:  Current Outpatient Prescriptions  Medication Sig Dispense Refill  . dexamethasone (DECADRON) 4 MG tablet Take 1 tablets by mouth once a day on the day after chemotherapy and then take 1 tablets two times a day for 2 days. Take with food. 30 tablet 1  . dexlansoprazole (DEXILANT) 60 MG capsule Take 1 capsule (60 mg total) by mouth daily. 30 capsule 4  . diazepam (VALIUM) 5 MG tablet Take 1 tablet (5 mg total) by mouth every 6 (six) hours as needed for anxiety. 30 tablet 0  . estradiol (ESTRACE) 1 MG tablet Take 1 mg by mouth daily.      . hydrochlorothiazide (HYDRODIURIL) 25 MG tablet Take 1 tablet (25 mg total) by mouth daily. 90 tablet 0  . HYDROcodone-acetaminophen (NORCO/VICODIN) 5-325 MG per tablet     . lidocaine-prilocaine (EMLA) cream Apply to affected area once 30 g 3  . LORazepam (ATIVAN) 0.5 MG tablet Take 0.5 mg by mouth at bedtime.  0  . Multiple Vitamin (MULTIVITAMIN) tablet Take 1 tablet by mouth daily.    . ondansetron (ZOFRAN) 8 MG tablet Take 1 tablet (8 mg total) by mouth  2 (two) times daily as needed. Start on the third day after chemotherapy. 30 tablet 1  . Phenazopyridine HCl (AZO TABS PO) Take 1 tablet by mouth.    . potassium chloride (MICRO-K) 10 MEQ CR capsule Take 2 capsules (20 mEq total) by mouth 2 (two) times daily. 30 capsule 1  . prochlorperazine (COMPAZINE) 10 MG tablet Take 1 tablet (10 mg total) by mouth every 6 (six) hours as needed (Nausea or vomiting). 30 tablet 1   No  current facility-administered medications for this visit.    PHYSICAL EXAMINATION: ECOG PERFORMANCE STATUS: 1 - Symptomatic but completely ambulatory  Filed Vitals:   12/29/14 0900  BP: 150/66  Pulse: 86  Temp: 97.7 F (36.5 C)  Resp: 18   Filed Weights   12/29/14 0900  Weight: 113 lb 1.6 oz (51.302 kg)    GENERAL:alert, no distress and comfortable SKIN: skin color, texture, turgor are normal, no rashes or significant lesions EYES: normal, Conjunctiva are pink and non-injected, sclera clear OROPHARYNX:no exudate, no erythema and lips, buccal mucosa, and tongue normal  NECK: supple, thyroid normal size, non-tender, without nodularity LYMPH:  no palpable lymphadenopathy in the cervical, axillary or inguinal LUNGS: clear to auscultation and percussion with normal breathing effort HEART: regular rate & rhythm and no murmurs and no lower extremity edema ABDOMEN:abdomen soft, non-tender and normal bowel sounds Musculoskeletal:no cyanosis of digits and no clubbing  NEURO: alert & oriented x 3 with fluent speech, no focal motor/sensory deficits  LABORATORY DATA:  I have reviewed the data as listed   Chemistry      Component Value Date/Time   NA 136 12/29/2014 0842   NA 133* 06/20/2014 2042   K 3.4* 12/29/2014 0842   K 3.4* 06/20/2014 2042   CL 97 06/20/2014 2042   CO2 29 12/29/2014 0842   CO2 22 06/20/2014 2042   BUN 6.6* 12/29/2014 0842   BUN 8 06/20/2014 2042   CREATININE 0.5* 12/29/2014 0842   CREATININE 0.37* 06/20/2014 2042      Component Value Date/Time   CALCIUM 9.4 12/29/2014 0842   CALCIUM 9.1 06/20/2014 2042   ALKPHOS 73 12/29/2014 0842   AST 16 12/29/2014 0842   ALT 13 12/29/2014 0842   BILITOT <0.20 12/29/2014 0842       Lab Results  Component Value Date   WBC 7.0 12/29/2014   HGB 9.8* 12/29/2014   HCT 29.9* 12/29/2014   MCV 79.8 12/29/2014   PLT 166 12/29/2014   NEUTROABS 6.0 12/29/2014   ASSESSMENT & PLAN:  Breast cancer of upper-outer  quadrant of left female breast 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 Neo-adjuvant chemotherapy with Adriamycin and Cytoxan every 2 weeks 4 followed by Abraxane weekly 12 2. Followed by surgery which could be with mastectomy versus lumpectomy (patient may need biopsies to demonstrate extent of disease after neo-adjuvant chemotherapy) 3. Following surgery she may need adjuvant radiation if she undergoes lumpectomy  Current treatment: Cycle 3 day 1 dose dense Adriamycin and Cytoxan Chemotherapy toxicities: 1. Frequent urination:  improved 2. Bone pain: Related to Neulasta.  Improvement with Claritin. 3. Neutropenia ANC 1.4: Expected for day 8 chemotherapy. No need of dose changes 4. Normocytic anemia: Hemoglobin 9.3 prior to starting chemotherapy she was anemic with a hemoglobin  of 10.6 5. Hypokalemia: Potassium is 3 and I ordered oral potassium replacement 20 mEq daily 12/15/2014 and it improved to 3.4. Decrease potassium to once daily 6. Hot flashes for 24 hours after chemotherapy 7. Alopecia 8.  Rash on the hands: itching. I instructed her to take Benadryl at bedtime. Use topical Benadryl or cortisone cream. 9.  Watering eyes and nose: patient will take Claritin.  I instructed her to take estrogenic replacement every other day to slowly wean her off. Return to clinic in 2 week for cycle 4 of chemotherapy.  After that she will start weekly Abraxane treatments. I explained to her that she will need to receive 12 weeks of Abraxane and after that we'll perform an MRI at rest and then refer her to surgery. After surgery if she undergoes lumpectomy she will need adjuvant radiation.   No orders of the defined types were placed in this encounter.   The patient has a good  understanding of the overall plan. she agrees with it. she will call with any problems that may develop before the next visit here.   Rulon Eisenmenger, MD

## 2015-01-12 ENCOUNTER — Telehealth: Payer: Self-pay | Admitting: Hematology and Oncology

## 2015-01-12 ENCOUNTER — Other Ambulatory Visit (HOSPITAL_BASED_OUTPATIENT_CLINIC_OR_DEPARTMENT_OTHER): Payer: Managed Care, Other (non HMO)

## 2015-01-12 ENCOUNTER — Ambulatory Visit (HOSPITAL_BASED_OUTPATIENT_CLINIC_OR_DEPARTMENT_OTHER): Payer: Managed Care, Other (non HMO) | Admitting: Hematology and Oncology

## 2015-01-12 ENCOUNTER — Encounter: Payer: Self-pay | Admitting: Hematology and Oncology

## 2015-01-12 ENCOUNTER — Ambulatory Visit (HOSPITAL_BASED_OUTPATIENT_CLINIC_OR_DEPARTMENT_OTHER): Payer: Managed Care, Other (non HMO)

## 2015-01-12 VITALS — BP 140/91 | HR 92 | Temp 98.3°F | Resp 18 | Ht 66.0 in | Wt 112.3 lb

## 2015-01-12 DIAGNOSIS — T451X5A Adverse effect of antineoplastic and immunosuppressive drugs, initial encounter: Secondary | ICD-10-CM

## 2015-01-12 DIAGNOSIS — L271 Localized skin eruption due to drugs and medicaments taken internally: Secondary | ICD-10-CM

## 2015-01-12 DIAGNOSIS — Z5111 Encounter for antineoplastic chemotherapy: Secondary | ICD-10-CM

## 2015-01-12 DIAGNOSIS — C50812 Malignant neoplasm of overlapping sites of left female breast: Secondary | ICD-10-CM

## 2015-01-12 DIAGNOSIS — C50412 Malignant neoplasm of upper-outer quadrant of left female breast: Secondary | ICD-10-CM

## 2015-01-12 LAB — COMPREHENSIVE METABOLIC PANEL (CC13)
ALT: 13 U/L (ref 0–55)
AST: 18 U/L (ref 5–34)
Albumin: 3.4 g/dL — ABNORMAL LOW (ref 3.5–5.0)
Alkaline Phosphatase: 79 U/L (ref 40–150)
Anion Gap: 9 mEq/L (ref 3–11)
BUN: 5.1 mg/dL — ABNORMAL LOW (ref 7.0–26.0)
CO2: 28 mEq/L (ref 22–29)
Calcium: 8.8 mg/dL (ref 8.4–10.4)
Chloride: 96 mEq/L — ABNORMAL LOW (ref 98–109)
Creatinine: 0.5 mg/dL — ABNORMAL LOW (ref 0.6–1.1)
EGFR: >90 mL/min/{1.73_m2} (ref >90)
Glucose: 82 mg/dl (ref 70–140)
Potassium: 2.9 mEq/L — CL (ref 3.5–5.1)
Sodium: 134 mEq/L — ABNORMAL LOW (ref 136–145)
Total Bilirubin: <0.2 mg/dL (ref 0.20–1.20)
Total Protein: 6.2 g/dL — ABNORMAL LOW (ref 6.4–8.3)

## 2015-01-12 LAB — CBC WITH DIFFERENTIAL/PLATELET
BASO%: 0.6 % (ref 0.0–2.0)
Basophils Absolute: 0 10*3/uL (ref 0.0–0.1)
EOS%: 0.3 % (ref 0.0–7.0)
Eosinophils Absolute: 0 10*3/uL (ref 0.0–0.5)
HCT: 28.1 % — ABNORMAL LOW (ref 34.8–46.6)
HGB: 9.3 g/dL — ABNORMAL LOW (ref 11.6–15.9)
LYMPH%: 4.8 % — ABNORMAL LOW (ref 14.0–49.7)
MCH: 27.1 pg (ref 25.1–34.0)
MCHC: 33.1 g/dL (ref 31.5–36.0)
MCV: 81.9 fL (ref 79.5–101.0)
MONO#: 0.8 10*3/uL (ref 0.1–0.9)
MONO%: 13 % (ref 0.0–14.0)
NEUT#: 5.2 10*3/uL (ref 1.5–6.5)
NEUT%: 81.3 % — ABNORMAL HIGH (ref 38.4–76.8)
Platelets: 202 10*3/uL (ref 145–400)
RBC: 3.44 10*6/uL — ABNORMAL LOW (ref 3.70–5.45)
RDW: 21.3 % — ABNORMAL HIGH (ref 11.2–14.5)
WBC: 6.4 10*3/uL (ref 3.9–10.3)
lymph#: 0.3 10*3/uL — ABNORMAL LOW (ref 0.9–3.3)

## 2015-01-12 MED ORDER — SODIUM CHLORIDE 0.9 % IJ SOLN
10.0000 mL | INTRAMUSCULAR | Status: DC | PRN
  Administered 2015-01-12: 10 mL
  Filled 2015-01-12: qty 10

## 2015-01-12 MED ORDER — PACLITAXEL PROTEIN-BOUND CHEMO INJECTION 100 MG
100.0000 mg/m2 | Freq: Once | INTRAVENOUS | Status: AC
  Administered 2015-01-12: 150 mg via INTRAVENOUS
  Filled 2015-01-12: qty 30

## 2015-01-12 MED ORDER — DIPHENHYDRAMINE HCL 25 MG PO CAPS
25.0000 mg | ORAL_CAPSULE | Freq: Once | ORAL | Status: AC
  Administered 2015-01-12: 25 mg via ORAL

## 2015-01-12 MED ORDER — HEPARIN SOD (PORK) LOCK FLUSH 100 UNIT/ML IV SOLN
500.0000 [IU] | Freq: Once | INTRAVENOUS | Status: AC | PRN
Start: 2015-01-12 — End: 2015-01-12
  Administered 2015-01-12: 500 [IU]
  Filled 2015-01-12: qty 5

## 2015-01-12 MED ORDER — PALONOSETRON HCL INJECTION 0.25 MG/5ML
INTRAVENOUS | Status: AC
  Filled 2015-01-12: qty 5

## 2015-01-12 MED ORDER — DIPHENHYDRAMINE HCL 25 MG PO CAPS
ORAL_CAPSULE | ORAL | Status: AC
  Filled 2015-01-12: qty 1

## 2015-01-12 MED ORDER — PALONOSETRON HCL INJECTION 0.25 MG/5ML
0.2500 mg | Freq: Once | INTRAVENOUS | Status: AC
  Administered 2015-01-12: 0.25 mg via INTRAVENOUS

## 2015-01-12 MED ORDER — SODIUM CHLORIDE 0.9 % IV SOLN
Freq: Once | INTRAVENOUS | Status: AC
  Administered 2015-01-12: 12:00:00 via INTRAVENOUS

## 2015-01-12 MED ORDER — METHYLPREDNISOLONE 4 MG PO TBPK: ORAL_TABLET | ORAL | Status: DC

## 2015-01-12 NOTE — Progress Notes (Signed)
Patient Care Team: Chipper Herb, MD as PCP - General (Family Medicine) Dr Geanie Berlin (Gynecology) Holley Bouche, NP as Nurse Practitioner (Nurse Practitioner)  DIAGNOSIS: Breast cancer of upper-outer quadrant of left female breast   Staging form: Breast, AJCC 7th Edition     Clinical stage from 11/18/2014: Stage IIB (T3, N0, M0) - Unsigned   SUMMARY OF ONCOLOGIC HISTORY:   Breast cancer of upper-outer quadrant of left female breast   11/09/2014 Mammogram Left breast irregular hypoechoic mass 12:00 position 5.3 x 3.7 x 2.9 cm, left low axilla normal sized lymph nodes   11/09/2014 Initial Diagnosis Left Bx: IDC with DCIS; Grade 3, Er 0%, PR 0%, Her 2 Neg, Ki 67: 70%   11/30/2014 -  Neo-Adjuvant Chemotherapy Dose dense Adriamycin and Cytoxan 4 followed by Abraxane weekly 12    CHIEF COMPLIANT: cycle 1/12 Abraxane  INTERVAL HISTORY: Sherry Bender is a 4 [employer] with above-mentioned history of left breast cancer currently continue adjuvant chemotherapy. She received 3 cycles of dose dense Adriamycin Cytoxan and had profound fixed drug eruption on the chest and back along with her extremities. It is causing her significant itching and discomfort. Her husband has been applying calamine lotion as well as giving her Benadryl pills. In spite of this the rash is present quite profound and appears to be getting worse with time. He reports that after cycle 2 the rash was mild but then after cycle 3, significantly worse. She continues to have occasional nausea. She denies any emesis.  REVIEW OF SYSTEMS:   Constitutional: Denies fevers, chills or abnormal weight loss Eyes: Denies blurriness of vision Ears, nose, mouth, throat, and face: Denies mucositis or sore throat Respiratory: Denies cough, dyspnea or wheezes Cardiovascular: Denies palpitation, chest discomfort or lower extremity swelling Gastrointestinal:  Denies nausea, heartburn or change in bowel habits Skin:profound rash maculopapular nature on  the chest wall and back along with upper extremities. Lymphatics: Denies new lymphadenopathy or easy bruising Neurological:complains of mild numbness of the tips of the fingers. Behavioral/Psych: anxiety issues Breast: the tumor in the breast is significantly smaller All other systems were reviewed with the patient and are negative.  I have reviewed the past medical history, past surgical history, social history and family history with the patient and they are unchanged from previous note.  ALLERGIES:  is allergic to codeine.  MEDICATIONS:  Current Outpatient Prescriptions  Medication Sig Dispense Refill  . dexamethasone (DECADRON) 4 MG tablet Take 1 tablets by mouth once a day on the day after chemotherapy and then take 1 tablets two times a day for 2 days. Take with food. 30 tablet 1  . dexlansoprazole (DEXILANT) 60 MG capsule Take 1 capsule (60 mg total) by mouth daily. 30 capsule 4  . diazepam (VALIUM) 5 MG tablet Take 1 tablet (5 mg total) by mouth every 6 (six) hours as needed for anxiety. 30 tablet 0  . estradiol (ESTRACE) 1 MG tablet Take 1 mg by mouth daily.      . hydrochlorothiazide (HYDRODIURIL) 25 MG tablet Take 1 tablet (25 mg total) by mouth daily. 90 tablet 0  . HYDROcodone-acetaminophen (NORCO/VICODIN) 5-325 MG per tablet     . lidocaine-prilocaine (EMLA) cream Apply to affected area once 30 g 3  . LORazepam (ATIVAN) 0.5 MG tablet Take 0.5 mg by mouth at bedtime.  0  . Multiple Vitamin (MULTIVITAMIN) tablet Take 1 tablet by mouth daily.    . ondansetron (ZOFRAN) 8 MG tablet Take 1 tablet (8 mg total) by  mouth 2 (two) times daily as needed. Start on the third day after chemotherapy. 30 tablet 1  . Phenazopyridine HCl (AZO TABS PO) Take 1 tablet by mouth.    . potassium chloride (MICRO-K) 10 MEQ CR capsule Take 2 capsules (20 mEq total) by mouth 2 (two) times daily. 30 capsule 1  . prochlorperazine (COMPAZINE) 10 MG tablet Take 1 tablet (10 mg total) by mouth every 6 (six)  hours as needed (Nausea or vomiting). 30 tablet 1   No current facility-administered medications for this visit.    PHYSICAL EXAMINATION: ECOG PERFORMANCE STATUS: 1 - Symptomatic but completely ambulatory  Filed Vitals:   01/12/15 1120  BP: 140/91  Pulse: 92  Temp: 98.3 F (36.8 C)  Resp: 18   Filed Weights   01/12/15 1120  Weight: 112 lb 4.8 oz (50.939 kg)    GENERAL:alert, no distress and comfortable SKIN:maculopapular rash throughout the chest wall and back with some skin excoriation. No skin breakdown EYES: normal, Conjunctiva are pink and non-injected, sclera clear OROPHARYNX:no exudate, no erythema and lips, buccal mucosa, and tongue normal  NECK: supple, thyroid normal size, non-tender, without nodularity LYMPH:  no palpable lymphadenopathy in the cervical, axillary or inguinal LUNGS: clear to auscultation and percussion with normal breathing effort HEART: regular rate & rhythm and no murmurs and no lower extremity edema ABDOMEN:abdomen soft, non-tender and normal bowel sounds Musculoskeletal:no cyanosis of digits and no clubbing  NEURO: alert & oriented x 3 with fluent speech, no focal motor/sensory deficits   LABORATORY DATA:  I have reviewed the data as listed   Chemistry      Component Value Date/Time   NA 136 12/29/2014 0842   NA 133* 06/20/2014 2042   K 3.4* 12/29/2014 0842   K 3.4* 06/20/2014 2042   CL 97 06/20/2014 2042   CO2 29 12/29/2014 0842   CO2 22 06/20/2014 2042   BUN 6.6* 12/29/2014 0842   BUN 8 06/20/2014 2042   CREATININE 0.5* 12/29/2014 0842   CREATININE 0.37* 06/20/2014 2042      Component Value Date/Time   CALCIUM 9.4 12/29/2014 0842   CALCIUM 9.1 06/20/2014 2042   ALKPHOS 73 12/29/2014 0842   AST 16 12/29/2014 0842   ALT 13 12/29/2014 0842   BILITOT <0.20 12/29/2014 0842       Lab Results  Component Value Date   WBC 6.4 01/12/2015   HGB 9.3* 01/12/2015   HCT 28.1* 01/12/2015   MCV 81.9 01/12/2015   PLT 202 01/12/2015    NEUTROABS 5.2 01/12/2015    ASSESSMENT & PLAN:  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 Neo-adjuvant chemotherapy with Adriamycin and Cytoxan every 2 weeks 4 followed by Abraxane weekly 12 2. Followed by surgery which could be with mastectomy versus lumpectomy (patient may need biopsies to demonstrate extent of disease after neo-adjuvant chemotherapy) 3. Following surgery she may need adjuvant radiation if she undergoes lumpectomy  Current treatment: cycle 1 of Abraxane ( patient could not receive cycle for Adriamycin and Cytoxan because of fixed drug eruption)  Chemotherapy toxicities: 1. Frequent urination: improved 2. Bone pain: Related to Neulasta. Improvement with Claritin. 3. Neutropenia ANC 1.4: Expected for day 8 chemotherapy. No need of dose changes  4. Normocytic anemia: Hemoglobin 9.3 prior to starting chemotherapy she was anemic with a hemoglobin of 10.6 5. Hypokalemia: Potassium is 3 and I ordered oral potassium replacement 20 mEq daily 12/15/2014 and it improved to 3.4. Decreased potassium to once daily, with the decrease in potassium to 2.9, we will increase the dosage of potassium to 40 mEq daily 01/12/2015 6. Hot flashes for 24 hours after chemotherapy 7. Alopecia 8. Rash on the hands: itching. I instructed her to take Benadryl at bedtime. Use topical Benadryl or cortisone cream. The rash has gotten significantly worse and it is covering her entire front and back of the chest. Hence we will discontinue Adriamycin and Cytoxan. She will then go on to receive her first dose of Abraxane today. We will call in a prescription for Medrol Dosepak. I also instructed her to continue to take Benadryl. We will give  her a dose of Benadryl in the chemotherapy room. 9. Watering eyes and nose: patient will take Claritin.  Return to clinic in 1 weeks for week 2/12 Abraxane and for toxicity evaluation and monitoring of the rash.   No orders of the defined types were placed in this encounter.   The patient has a good understanding of the overall plan. she agrees with it. she will call with any problems that may develop before the next visit here.   Rulon Eisenmenger, MD

## 2015-01-12 NOTE — Patient Instructions (Signed)
[facility] Discharge Instructions for Patients Receiving Chemotherapy  Today you received the following chemotherapy agents Abraxane.  To help prevent nausea and vomiting after your treatment, we encourage you to take your nausea medication as prescribed by your docotr.   If you develop nausea and vomiting that is not controlled by your nausea medication, call the clinic.   BELOW ARE SYMPTOMS THAT SHOULD BE REPORTED IMMEDIATELY:  *FEVER GREATER THAN 100.5 F  *CHILLS WITH OR WITHOUT FEVER  NAUSEA AND VOMITING THAT IS NOT CONTROLLED WITH YOUR NAUSEA MEDICATION  *UNUSUAL SHORTNESS OF BREATH  *UNUSUAL BRUISING OR BLEEDING  TENDERNESS IN MOUTH AND THROAT WITH OR WITHOUT PRESENCE OF ULCERS  *URINARY PROBLEMS  *BOWEL PROBLEMS  UNUSUAL RASH Items with * indicate a potential emergency and should be followed up as soon as possible.  Feel free to call the clinic you have any questions or concerns. The clinic phone number is [phone].  Please show the [facility] at check-in to the Emergency Department and triage nurse.     Nanoparticle Albumin-Bound Paclitaxel injection What is this medicine? NANOPARTICLE ALBUMIN-BOUND PACLITAXEL (Na no PAHR ti kuhl al BYOO muhn-bound PAK li TAX el) is a chemotherapy drug. It targets fast dividing cells, like cancer cells, and causes these cells to die. This medicine is used to treat advanced breast cancer and advanced lung cancer. This medicine may be used for other purposes; ask your health care provider or pharmacist if you have questions. COMMON BRAND NAME(S): Abraxane What should I tell my health care provider before I take this medicine? They need to know if you have any of these conditions: -kidney disease -liver disease -low blood counts, like low platelets, red blood cells, or white blood cells -recent or ongoing radiation therapy -an unusual or allergic reaction to paclitaxel, albumin, other chemotherapy,  other medicines, foods, dyes, or preservatives -pregnant or trying to get pregnant -breast-feeding How should I use this medicine? This drug is given as an infusion into a vein. It is administered in a hospital or clinic by a specially trained health care professional. Talk to your pediatrician regarding the use of this medicine in children. Special care may be needed. Overdosage: If you think you have taken too much of this medicine contact a poison control center or emergency room at once. NOTE: This medicine is only for you. Do not share this medicine with others. What if I miss a dose? It is important not to miss your dose. Call your doctor or health care professional if you are unable to keep an appointment. What may interact with this medicine? -cyclosporine -diazepam -ketoconazole -medicines to increase blood counts like filgrastim, pegfilgrastim, sargramostim -other chemotherapy drugs like cisplatin, doxorubicin, epirubicin, etoposide, teniposide, vincristine -quinidine -testosterone -vaccines -verapamil Talk to your doctor or health care professional before taking any of these medicines: -acetaminophen -aspirin -ibuprofen -ketoprofen -naproxen This list may not describe all possible interactions. Give your health care provider a list of all the medicines, herbs, non-prescription drugs, or dietary supplements you use. Also tell them if you smoke, drink alcohol, or use illegal drugs. Some items may interact with your medicine. What should I watch for while using this medicine? Your condition will be monitored carefully while you are receiving this medicine. You will need important blood work done while you are taking this medicine. This drug may make you feel generally unwell. This is not uncommon, as chemotherapy can affect healthy cells as well as cancer cells. Report any side effects.  Continue your course of treatment even though you feel ill unless your doctor tells you to  stop. In some cases, you may be given additional medicines to help with side effects. Follow all directions for their use. Call your doctor or health care professional for advice if you get a fever, chills or sore throat, or other symptoms of a cold or flu. Do not treat yourself. This drug decreases your body's ability to fight infections. Try to avoid being around people who are sick. This medicine may increase your risk to bruise or bleed. Call your doctor or health care professional if you notice any unusual bleeding. Be careful brushing and flossing your teeth or using a toothpick because you may get an infection or bleed more easily. If you have any dental work done, tell your dentist you are receiving this medicine. Avoid taking products that contain aspirin, acetaminophen, ibuprofen, naproxen, or ketoprofen unless instructed by your doctor. These medicines may hide a fever. Do not become pregnant while taking this medicine. Women should inform their doctor if they wish to become pregnant or think they might be pregnant. There is a potential for serious side effects to an unborn child. Talk to your health care professional or pharmacist for more information. Do not breast-feed an infant while taking this medicine. Men are advised not to father a child while receiving this medicine. What side effects may I notice from receiving this medicine? Side effects that you should report to your doctor or health care professional as soon as possible: -allergic reactions like skin rash, itching or hives, swelling of the face, lips, or tongue -low blood counts - This drug may decrease the number of white blood cells, red blood cells and platelets. You may be at increased risk for infections and bleeding. -signs of infection - fever or chills, cough, sore throat, pain or difficulty passing urine -signs of decreased platelets or bleeding - bruising, pinpoint red spots on the skin, black, tarry stools,  nosebleeds -signs of decreased red blood cells - unusually weak or tired, fainting spells, lightheadedness -breathing problems -changes in vision -chest pain -high or low blood pressure -mouth sores -nausea and vomiting -pain, swelling, redness or irritation at the injection site -pain, tingling, numbness in the hands or feet -slow or irregular heartbeat -swelling of the ankle, feet, hands Side effects that usually do not require medical attention (report to your doctor or health care professional if they continue or are bothersome): -aches, pains -changes in the color of fingernails -diarrhea -hair loss -loss of appetite This list may not describe all possible side effects. Call your doctor for medical advice about side effects. You may report side effects to FDA at 1-800-FDA-1088. Where should I keep my medicine? This drug is given in a hospital or clinic and will not be stored at home. NOTE: This sheet is a summary. It may not cover all possible information. If you have questions about this medicine, talk to your doctor, pharmacist, or health care provider.  2015, [employer]. (2012-07-29 16:48:50)

## 2015-01-12 NOTE — Assessment & Plan Note (Signed)
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 Neo-adjuvant chemotherapy with Adriamycin and Cytoxan every 2 weeks 4 followed by Abraxane weekly 12 2. Followed by surgery which could be with mastectomy versus lumpectomy (patient may need biopsies to demonstrate extent of disease after neo-adjuvant chemotherapy) 3. Following surgery she may need adjuvant radiation if she undergoes lumpectomy  Current treatment: Cycle 4 day 1 dose dense Adriamycin and Cytoxan Chemotherapy toxicities: 1. Frequent urination: improved 2. Bone pain: Related to Neulasta. Improvement with Claritin. 3. Neutropenia ANC 1.4: Expected for day 8 chemotherapy. No need of dose changes 4. Normocytic anemia: Hemoglobin 9.3 prior to starting chemotherapy she was anemic with a hemoglobin of 10.6 5. Hypokalemia: Potassium is 3 and I ordered oral potassium replacement 20 mEq daily 12/15/2014 and it improved to 3.4. Decrease potassium to once daily 6. Hot flashes for 24 hours after chemotherapy 7. Alopecia 8. Rash on the hands: itching. I instructed her to take Benadryl at bedtime. Use topical Benadryl or cortisone cream. 9. Watering eyes and nose: patient will take Claritin.  Return to clinic in 2 weeks for week 1/12 Abraxane

## 2015-01-12 NOTE — Progress Notes (Signed)
Panic K+ 2.9.  Per Dr. Lindi Adie, he had previously had her reduce her K+ supplement to 1x/day.  Instruct her to increase to 2x/day.  Gave Dawn Placke, Infusion RN the instructions, she will notify patient in infusion room.

## 2015-01-12 NOTE — Telephone Encounter (Signed)
Appointments made and avs will be printed in chemo,inbox to dr Lindi Adie to advise that there are no available appointments 8/16

## 2015-01-19 ENCOUNTER — Encounter: Payer: Self-pay | Admitting: Hematology and Oncology

## 2015-01-19 ENCOUNTER — Other Ambulatory Visit (HOSPITAL_BASED_OUTPATIENT_CLINIC_OR_DEPARTMENT_OTHER): Payer: Managed Care, Other (non HMO)

## 2015-01-19 ENCOUNTER — Encounter: Payer: Self-pay | Admitting: *Deleted

## 2015-01-19 ENCOUNTER — Ambulatory Visit (HOSPITAL_BASED_OUTPATIENT_CLINIC_OR_DEPARTMENT_OTHER): Payer: Managed Care, Other (non HMO)

## 2015-01-19 ENCOUNTER — Other Ambulatory Visit: Payer: Self-pay

## 2015-01-19 ENCOUNTER — Ambulatory Visit (HOSPITAL_BASED_OUTPATIENT_CLINIC_OR_DEPARTMENT_OTHER): Payer: Managed Care, Other (non HMO) | Admitting: Hematology and Oncology

## 2015-01-19 VITALS — BP 123/64 | HR 91 | Temp 98.2°F | Resp 18 | Ht 66.0 in | Wt 111.1 lb

## 2015-01-19 DIAGNOSIS — C50812 Malignant neoplasm of overlapping sites of left female breast: Secondary | ICD-10-CM

## 2015-01-19 DIAGNOSIS — D649 Anemia, unspecified: Secondary | ICD-10-CM

## 2015-01-19 DIAGNOSIS — C50412 Malignant neoplasm of upper-outer quadrant of left female breast: Secondary | ICD-10-CM

## 2015-01-19 DIAGNOSIS — R21 Rash and other nonspecific skin eruption: Secondary | ICD-10-CM

## 2015-01-19 DIAGNOSIS — E876 Hypokalemia: Secondary | ICD-10-CM

## 2015-01-19 DIAGNOSIS — Z5111 Encounter for antineoplastic chemotherapy: Secondary | ICD-10-CM

## 2015-01-19 LAB — COMPREHENSIVE METABOLIC PANEL (CC13)
ALT: 17 U/L (ref 0–55)
AST: 20 U/L (ref 5–34)
Albumin: 3.5 g/dL (ref 3.5–5.0)
Alkaline Phosphatase: 58 U/L (ref 40–150)
Anion Gap: 8 mEq/L (ref 3–11)
BUN: 8 mg/dL (ref 7.0–26.0)
CO2: 29 mEq/L (ref 22–29)
Calcium: 9 mg/dL (ref 8.4–10.4)
Chloride: 97 mEq/L — ABNORMAL LOW (ref 98–109)
Creatinine: 0.5 mg/dL — ABNORMAL LOW (ref 0.6–1.1)
EGFR: >90 mL/min/{1.73_m2} (ref >90)
Glucose: 76 mg/dl (ref 70–140)
Potassium: 3.2 mEq/L — ABNORMAL LOW (ref 3.5–5.1)
Sodium: 134 mEq/L — ABNORMAL LOW (ref 136–145)
Total Bilirubin: 0.32 mg/dL (ref 0.20–1.20)
Total Protein: 6.2 g/dL — ABNORMAL LOW (ref 6.4–8.3)

## 2015-01-19 LAB — CBC WITH DIFFERENTIAL/PLATELET
BASO%: 1.4 % (ref 0.0–2.0)
Basophils Absolute: 0.1 10*3/uL (ref 0.0–0.1)
EOS%: 0.6 % (ref 0.0–7.0)
Eosinophils Absolute: 0 10*3/uL (ref 0.0–0.5)
HCT: 27.3 % — ABNORMAL LOW (ref 34.8–46.6)
HGB: 9 g/dL — ABNORMAL LOW (ref 11.6–15.9)
LYMPH%: 8.2 % — ABNORMAL LOW (ref 14.0–49.7)
MCH: 27.3 pg (ref 25.1–34.0)
MCHC: 33.1 g/dL (ref 31.5–36.0)
MCV: 82.6 fL (ref 79.5–101.0)
MONO#: 0.4 10*3/uL (ref 0.1–0.9)
MONO%: 11.7 % (ref 0.0–14.0)
NEUT#: 3 10*3/uL (ref 1.5–6.5)
NEUT%: 78.1 % — ABNORMAL HIGH (ref 38.4–76.8)
Platelets: 268 10*3/uL (ref 145–400)
RBC: 3.3 10*6/uL — ABNORMAL LOW (ref 3.70–5.45)
RDW: 23.5 % — ABNORMAL HIGH (ref 11.2–14.5)
WBC: 3.9 10*3/uL (ref 3.9–10.3)
lymph#: 0.3 10*3/uL — ABNORMAL LOW (ref 0.9–3.3)

## 2015-01-19 MED ORDER — SODIUM CHLORIDE 0.9 % IV SOLN
Freq: Once | INTRAVENOUS | Status: AC
  Administered 2015-01-19: 11:00:00 via INTRAVENOUS

## 2015-01-19 MED ORDER — PALONOSETRON HCL INJECTION 0.25 MG/5ML
INTRAVENOUS | Status: AC
  Filled 2015-01-19: qty 5

## 2015-01-19 MED ORDER — HEPARIN SOD (PORK) LOCK FLUSH 100 UNIT/ML IV SOLN
500.0000 [IU] | Freq: Once | INTRAVENOUS | Status: AC | PRN
  Administered 2015-01-19: 500 [IU]
  Filled 2015-01-19: qty 5

## 2015-01-19 MED ORDER — POTASSIUM CHLORIDE ER 10 MEQ PO CPCR: 20.0000 meq | ORAL_CAPSULE | Freq: Two times a day (BID) | ORAL | Status: DC

## 2015-01-19 MED ORDER — PALONOSETRON HCL INJECTION 0.25 MG/5ML
0.2500 mg | Freq: Once | INTRAVENOUS | Status: AC
  Administered 2015-01-19: 0.25 mg via INTRAVENOUS

## 2015-01-19 MED ORDER — PACLITAXEL PROTEIN-BOUND CHEMO INJECTION 100 MG
100.0000 mg/m2 | Freq: Once | INTRAVENOUS | Status: AC
  Administered 2015-01-19: 150 mg via INTRAVENOUS
  Filled 2015-01-19: qty 30

## 2015-01-19 MED ORDER — SODIUM CHLORIDE 0.9 % IJ SOLN
10.0000 mL | INTRAMUSCULAR | Status: DC | PRN
  Administered 2015-01-19: 10 mL
  Filled 2015-01-19: qty 10

## 2015-01-19 NOTE — Progress Notes (Signed)
Patient Care Team: Chipper Herb, MD as PCP - General (Family Medicine) Dr Geanie Berlin (Gynecology) Holley Bouche, NP as Nurse Practitioner (Nurse Practitioner)  DIAGNOSIS: Breast cancer of upper-outer quadrant of left female breast   Staging form: Breast, AJCC 7th Edition     Clinical stage from 11/18/2014: Stage IIB (T3, N0, M0) - Unsigned   SUMMARY OF ONCOLOGIC HISTORY:   Breast cancer of upper-outer quadrant of left female breast   11/09/2014 Mammogram Left breast irregular hypoechoic mass 12:00 position 5.3 x 3.7 x 2.9 cm, left low axilla normal sized lymph nodes   11/09/2014 Initial Diagnosis Left Bx: IDC with DCIS; Grade 3, Er 0%, PR 0%, Her 2 Neg, Ki 67: 70%   11/30/2014 -  Neo-Adjuvant Chemotherapy Dose dense Adriamycin and Cytoxan 4 followed by Abraxane weekly 12    CHIEF COMPLIANT: 2/12 Abraxane  INTERVAL HISTORY: Sherry Bender is a 65 year old above-mentioned history of left breast cancer continue adjuvant chemotherapy. She tolerated cycle one of [place]xton very well she felt some numbness in the right hand which completely went away. She does not have any nausea or vomiting. She had rational chest and the back from Adriamycin Cytoxan which is markedly improved by stopping the treatment. She is using calamine lotion which is helping her.  REVIEW OF SYSTEMS:   Constitutional: Denies fevers, chills or abnormal weight loss Eyes: Denies blurriness of vision Ears, nose, mouth, throat, and face: Denies mucositis or sore throat Respiratory: Denies cough, dyspnea or wheezes Cardiovascular: Denies palpitation, chest discomfort or lower extremity swelling Gastrointestinal:  Denies nausea, heartburn or change in bowel habits Skin: Denies abnormal skin rashes Lymphatics: Denies new lymphadenopathy or easy bruising Neurological:Denies numbness, tingling or new weaknesses Behavioral/Psych: Mood is stable, no new changes   All other systems were reviewed with the patient and are  negative.  I have reviewed the past medical history, past surgical history, social history and family history with the patient and they are unchanged from previous note.  ALLERGIES:  is allergic to codeine.  MEDICATIONS:  Current Outpatient Prescriptions  Medication Sig Dispense Refill  . dexlansoprazole (DEXILANT) 60 MG capsule Take 1 capsule (60 mg total) by mouth daily. 30 capsule 4  . diazepam (VALIUM) 5 MG tablet Take 1 tablet (5 mg total) by mouth every 6 (six) hours as needed for anxiety. 30 tablet 0  . estradiol (ESTRACE) 1 MG tablet Take 1 mg by mouth daily.      . hydrochlorothiazide (HYDRODIURIL) 25 MG tablet Take 1 tablet (25 mg total) by mouth daily. 90 tablet 0  . HYDROcodone-acetaminophen (NORCO/VICODIN) 5-325 MG per tablet     . LORazepam (ATIVAN) 0.5 MG tablet Take 0.5 mg by mouth at bedtime.  0  . methylPREDNISolone (MEDROL DOSEPAK) 4 MG TBPK tablet Take as directed 21 tablet 0  . Multiple Vitamin (MULTIVITAMIN) tablet Take 1 tablet by mouth daily.    . Phenazopyridine HCl (AZO TABS PO) Take 1 tablet by mouth.    . potassium chloride (MICRO-K) 10 MEQ CR capsule Take 2 capsules (20 mEq total) by mouth 2 (two) times daily. 30 capsule 1   No current facility-administered medications for this visit.    PHYSICAL EXAMINATION: ECOG PERFORMANCE STATUS: 1 - Symptomatic but completely ambulatory  Filed Vitals:   01/19/15 1036  BP: 123/64  Pulse: 91  Temp: 98.2 F (36.8 C)  Resp: 18   Filed Weights   01/19/15 1036  Weight: 111 lb 1.6 oz (50.395 kg)    GENERAL:alert, no distress and  comfortable SKIN: skin color, texture, turgor are normal, no rashes or significant lesions EYES: normal, Conjunctiva are pink and non-injected, sclera clear OROPHARYNX:no exudate, no erythema and lips, buccal mucosa, and tongue normal  NECK: supple, thyroid normal size, non-tender, without nodularity LYMPH:  no palpable lymphadenopathy in the cervical, axillary or inguinal LUNGS: clear  to auscultation and percussion with normal breathing effort HEART: regular rate & rhythm and no murmurs and no lower extremity edema ABDOMEN:abdomen soft, non-tender and normal bowel sounds Musculoskeletal:no cyanosis of digits and no clubbing  NEURO: alert & oriented x 3 with fluent speech, no focal motor/sensory deficits  LABORATORY DATA:  I have reviewed the data as listed   Chemistry      Component Value Date/Time   NA 134* 01/12/2015 1110   NA 133* 06/20/2014 2042   K 2.9* 01/12/2015 1110   K 3.4* 06/20/2014 2042   CL 97 06/20/2014 2042   CO2 28 01/12/2015 1110   CO2 22 06/20/2014 2042   BUN 5.1* 01/12/2015 1110   BUN 8 06/20/2014 2042   CREATININE 0.5* 01/12/2015 1110   CREATININE 0.37* 06/20/2014 2042      Component Value Date/Time   CALCIUM 8.8 01/12/2015 1110   CALCIUM 9.1 06/20/2014 2042   ALKPHOS 79 01/12/2015 1110   AST 18 01/12/2015 1110   ALT 13 01/12/2015 1110   BILITOT <0.20 01/12/2015 1110       Lab Results  Component Value Date   WBC 3.9 01/19/2015   HGB 9.0* 01/19/2015   HCT 27.3* 01/19/2015   MCV 82.6 01/19/2015   PLT 268 01/19/2015   NEUTROABS 3.0 01/19/2015   ASSESSMENT & PLAN:  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 Neo-adjuvant chemotherapy with Adriamycin and Cytoxan every 2 weeks 4 followed by Abraxane weekly 12 2. Followed by surgery which could be with mastectomy versus lumpectomy (patient may need biopsies to demonstrate extent of disease after neo-adjuvant chemotherapy) 3. Following surgery she may need adjuvant radiation if she undergoes lumpectomy  Current treatment: cycle 2/12 of Abraxane ( patient could not receive cycle 4 Adriamycin and Cytoxan because of  fixed drug eruption)  Chemotherapy toxicities: 1. Frequent urination: improved 2. Bone pain: Related to Neulasta. Improvement with Claritin. 3. Neutropenia ANC 1.4: Expected for day 8 chemotherapy. No need of dose changes 4. Normocytic anemia: Hemoglobin 9.3 prior to starting chemotherapy she was anemic with a hemoglobin of 10.6 5. Hypokalemia: Potassium is 3 and I ordered oral potassium replacement 20 mEq daily 12/15/2014 and it improved to 3.4. Decreased potassium to once daily, with the decrease in potassium to 2.9, we will increase the dosage of potassium to 40 mEq daily 01/12/2015 6. Hot flashes for 24 hours after chemotherapy 7. Alopecia 8. Rash on the hands: itching related to chemotherapy with Adriamycin and Cytoxan. Fourth cycle of chemotherapy was not given for this reason. Patient is using Benadryl. We've previously treated her with Solu-Medrol. Marked improvement in the rash since last week. 9. Watering eyes and nose: patient takes Claritin.  Return to clinic in 3 weeks for week 5/12 Abraxane and for toxicity evaluation and monitoring of the rash.   No orders of the defined types were placed in this encounter.  The patient has a good understanding of the overall plan. she agrees with it. she will call with any problems that may develop before the next visit here.   Rulon Eisenmenger, MD infectious disease or the

## 2015-01-19 NOTE — Patient Instructions (Signed)
[facility] Discharge Instructions for Patients Receiving Chemotherapy  Today you received the following chemotherapy agents Abraxane.  To help prevent nausea and vomiting after your treatment, we encourage you to take your nausea medication as prescribed by your doctor.   If you develop nausea and vomiting that is not controlled by your nausea medication, call the clinic.   BELOW ARE SYMPTOMS THAT SHOULD BE REPORTED IMMEDIATELY:  *FEVER GREATER THAN 100.5 F  *CHILLS WITH OR WITHOUT FEVER  NAUSEA AND VOMITING THAT IS NOT CONTROLLED WITH YOUR NAUSEA MEDICATION  *UNUSUAL SHORTNESS OF BREATH  *UNUSUAL BRUISING OR BLEEDING  TENDERNESS IN MOUTH AND THROAT WITH OR WITHOUT PRESENCE OF ULCERS  *URINARY PROBLEMS  *BOWEL PROBLEMS  UNUSUAL RASH Items with * indicate a potential emergency and should be followed up as soon as possible.  Feel free to call the clinic you have any questions or concerns. The clinic phone number is [phone].  Please show the [facility] at check-in to the Emergency Department and triage nurse.     Nanoparticle Albumin-Bound Paclitaxel injection What is this medicine? NANOPARTICLE ALBUMIN-BOUND PACLITAXEL (Na no PAHR ti kuhl al BYOO muhn-bound PAK li TAX el) is a chemotherapy drug. It targets fast dividing cells, like cancer cells, and causes these cells to die. This medicine is used to treat advanced breast cancer and advanced lung cancer. This medicine may be used for other purposes; ask your health care provider or pharmacist if you have questions. COMMON BRAND NAME(S): Abraxane What should I tell my health care provider before I take this medicine? They need to know if you have any of these conditions: -kidney disease -liver disease -low blood counts, like low platelets, red blood cells, or white blood cells -recent or ongoing radiation therapy -an unusual or allergic reaction to paclitaxel, albumin, other chemotherapy,  other medicines, foods, dyes, or preservatives -pregnant or trying to get pregnant -breast-feeding How should I use this medicine? This drug is given as an infusion into a vein. It is administered in a hospital or clinic by a specially trained health care professional. Talk to your pediatrician regarding the use of this medicine in children. Special care may be needed. Overdosage: If you think you have taken too much of this medicine contact a poison control center or emergency room at once. NOTE: This medicine is only for you. Do not share this medicine with others. What if I miss a dose? It is important not to miss your dose. Call your doctor or health care professional if you are unable to keep an appointment. What may interact with this medicine? -cyclosporine -diazepam -ketoconazole -medicines to increase blood counts like filgrastim, pegfilgrastim, sargramostim -other chemotherapy drugs like cisplatin, doxorubicin, epirubicin, etoposide, teniposide, vincristine -quinidine -testosterone -vaccines -verapamil Talk to your doctor or health care professional before taking any of these medicines: -acetaminophen -aspirin -ibuprofen -ketoprofen -naproxen This list may not describe all possible interactions. Give your health care provider a list of all the medicines, herbs, non-prescription drugs, or dietary supplements you use. Also tell them if you smoke, drink alcohol, or use illegal drugs. Some items may interact with your medicine. What should I watch for while using this medicine? Your condition will be monitored carefully while you are receiving this medicine. You will need important blood work done while you are taking this medicine. This drug may make you feel generally unwell. This is not uncommon, as chemotherapy can affect healthy cells as well as cancer cells. Report any side effects.  Continue your course of treatment even though you feel ill unless your doctor tells you to  stop. In some cases, you may be given additional medicines to help with side effects. Follow all directions for their use. Call your doctor or health care professional for advice if you get a fever, chills or sore throat, or other symptoms of a cold or flu. Do not treat yourself. This drug decreases your body's ability to fight infections. Try to avoid being around people who are sick. This medicine may increase your risk to bruise or bleed. Call your doctor or health care professional if you notice any unusual bleeding. Be careful brushing and flossing your teeth or using a toothpick because you may get an infection or bleed more easily. If you have any dental work done, tell your dentist you are receiving this medicine. Avoid taking products that contain aspirin, acetaminophen, ibuprofen, naproxen, or ketoprofen unless instructed by your doctor. These medicines may hide a fever. Do not become pregnant while taking this medicine. Women should inform their doctor if they wish to become pregnant or think they might be pregnant. There is a potential for serious side effects to an unborn child. Talk to your health care professional or pharmacist for more information. Do not breast-feed an infant while taking this medicine. Men are advised not to father a child while receiving this medicine. What side effects may I notice from receiving this medicine? Side effects that you should report to your doctor or health care professional as soon as possible: -allergic reactions like skin rash, itching or hives, swelling of the face, lips, or tongue -low blood counts - This drug may decrease the number of white blood cells, red blood cells and platelets. You may be at increased risk for infections and bleeding. -signs of infection - fever or chills, cough, sore throat, pain or difficulty passing urine -signs of decreased platelets or bleeding - bruising, pinpoint red spots on the skin, black, tarry stools,  nosebleeds -signs of decreased red blood cells - unusually weak or tired, fainting spells, lightheadedness -breathing problems -changes in vision -chest pain -high or low blood pressure -mouth sores -nausea and vomiting -pain, swelling, redness or irritation at the injection site -pain, tingling, numbness in the hands or feet -slow or irregular heartbeat -swelling of the ankle, feet, hands Side effects that usually do not require medical attention (report to your doctor or health care professional if they continue or are bothersome): -aches, pains -changes in the color of fingernails -diarrhea -hair loss -loss of appetite This list may not describe all possible side effects. Call your doctor for medical advice about side effects. You may report side effects to FDA at 1-800-FDA-1088. Where should I keep my medicine? This drug is given in a hospital or clinic and will not be stored at home. NOTE: This sheet is a summary. It may not cover all possible information. If you have questions about this medicine, talk to your doctor, pharmacist, or health care provider.  2015, [employer]vier/[employer] Standard. (2012-07-29 16:48:50)

## 2015-01-19 NOTE — Assessment & Plan Note (Signed)
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 Neo-adjuvant chemotherapy with Adriamycin and Cytoxan every 2 weeks 4 followed by Abraxane weekly 12 2. Followed by surgery which could be with mastectomy versus lumpectomy (patient may need biopsies to demonstrate extent of disease after neo-adjuvant chemotherapy) 3. Following surgery she may need adjuvant radiation if she undergoes lumpectomy  Current treatment: cycle 2/12 of Abraxane ( patient could not receive cycle 4 Adriamycin and Cytoxan because of fixed drug eruption)  Chemotherapy toxicities: 1. Frequent urination: improved 2. Bone pain: Related to Neulasta. Improvement with Claritin. 3. Neutropenia ANC 1.4: Expected for day 8 chemotherapy. No need of dose changes 4. Normocytic anemia: Hemoglobin 9.3 prior to starting chemotherapy she was anemic with a hemoglobin of 10.6 5. Hypokalemia: Potassium is 3 and I ordered oral potassium replacement 20 mEq daily 12/15/2014 and it improved to 3.4. Decreased potassium to once daily, with the decrease in potassium to 2.9, we will increase the dosage of potassium to 40 mEq daily 01/12/2015 6. Hot flashes for 24 hours after chemotherapy 7. Alopecia 8. Rash on the hands: itching related to chemotherapy with Adriamycin and Cytoxan. Fourth cycle of chemotherapy was not given for this reason. Patient is using Benadryl. We've previously treated her with Solu-Medrol.  9. Watering eyes and nose: patient takes Claritin.  Return to clinic in 2 weeks for week 4/12 Abraxane and for toxicity evaluation and monitoring of the rash.

## 2015-01-19 NOTE — Progress Notes (Signed)
Pt provided with info on potassium rich fiids,

## 2015-01-19 NOTE — Addendum Note (Signed)
Addended by: Prentiss Bells on: 01/19/2015 12:54 PM   Modules accepted: Orders

## 2015-01-26 ENCOUNTER — Encounter: Payer: Self-pay | Admitting: *Deleted

## 2015-01-26 ENCOUNTER — Other Ambulatory Visit (HOSPITAL_BASED_OUTPATIENT_CLINIC_OR_DEPARTMENT_OTHER): Payer: Managed Care, Other (non HMO)

## 2015-01-26 ENCOUNTER — Ambulatory Visit (HOSPITAL_BASED_OUTPATIENT_CLINIC_OR_DEPARTMENT_OTHER): Payer: Managed Care, Other (non HMO)

## 2015-01-26 ENCOUNTER — Ambulatory Visit: Payer: Managed Care, Other (non HMO) | Admitting: Hematology and Oncology

## 2015-01-26 ENCOUNTER — Ambulatory Visit: Payer: Managed Care, Other (non HMO)

## 2015-01-26 VITALS — BP 115/61 | HR 81 | Temp 98.3°F | Resp 16

## 2015-01-26 DIAGNOSIS — Z5111 Encounter for antineoplastic chemotherapy: Secondary | ICD-10-CM

## 2015-01-26 DIAGNOSIS — C50412 Malignant neoplasm of upper-outer quadrant of left female breast: Secondary | ICD-10-CM

## 2015-01-26 DIAGNOSIS — C50812 Malignant neoplasm of overlapping sites of left female breast: Secondary | ICD-10-CM | POA: Diagnosis not present

## 2015-01-26 LAB — COMPREHENSIVE METABOLIC PANEL (CC13)
ALT: 18 U/L (ref 0–55)
AST: 20 U/L (ref 5–34)
Albumin: 3.5 g/dL (ref 3.5–5.0)
Alkaline Phosphatase: 59 U/L (ref 40–150)
Anion Gap: 6 meq/L (ref 3–11)
BUN: 7.9 mg/dL (ref 7.0–26.0)
CO2: 29 meq/L (ref 22–29)
Calcium: 9.2 mg/dL (ref 8.4–10.4)
Chloride: 99 meq/L (ref 98–109)
Creatinine: 0.6 mg/dL (ref 0.6–1.1)
EGFR: >90 ml/min/1.73 m2
Glucose: 115 mg/dL (ref 70–140)
Potassium: 3.3 meq/L — ABNORMAL LOW (ref 3.5–5.1)
Sodium: 135 meq/L — ABNORMAL LOW (ref 136–145)
Total Bilirubin: 0.25 mg/dL (ref 0.20–1.20)
Total Protein: 6.1 g/dL — ABNORMAL LOW (ref 6.4–8.3)

## 2015-01-26 LAB — CBC WITH DIFFERENTIAL/PLATELET
BASO%: 1.4 % (ref 0.0–2.0)
Basophils Absolute: 0 10*3/uL (ref 0.0–0.1)
EOS%: 6.6 % (ref 0.0–7.0)
Eosinophils Absolute: 0.2 10*3/uL (ref 0.0–0.5)
HCT: 26.4 % — ABNORMAL LOW (ref 34.8–46.6)
HGB: 8.7 g/dL — ABNORMAL LOW (ref 11.6–15.9)
LYMPH%: 14.1 % (ref 14.0–49.7)
MCH: 27.7 pg (ref 25.1–34.0)
MCHC: 32.8 g/dL (ref 31.5–36.0)
MCV: 84.3 fL (ref 79.5–101.0)
MONO#: 0.3 10*3/uL (ref 0.1–0.9)
MONO%: 12.4 % (ref 0.0–14.0)
NEUT#: 1.8 10*3/uL (ref 1.5–6.5)
NEUT%: 65.5 % (ref 38.4–76.8)
Platelets: 164 10*3/uL (ref 145–400)
RBC: 3.13 10*6/uL — ABNORMAL LOW (ref 3.70–5.45)
RDW: 24.9 % — ABNORMAL HIGH (ref 11.2–14.5)
WBC: 2.8 10*3/uL — ABNORMAL LOW (ref 3.9–10.3)
lymph#: 0.4 10*3/uL — ABNORMAL LOW (ref 0.9–3.3)

## 2015-01-26 MED ORDER — PACLITAXEL PROTEIN-BOUND CHEMO INJECTION 100 MG
100.0000 mg/m2 | Freq: Once | INTRAVENOUS | Status: AC
  Administered 2015-01-26: 150 mg via INTRAVENOUS
  Filled 2015-01-26: qty 30

## 2015-01-26 MED ORDER — SODIUM CHLORIDE 0.9 % IJ SOLN
10.0000 mL | INTRAMUSCULAR | Status: DC | PRN
  Administered 2015-01-26: 10 mL
  Filled 2015-01-26: qty 10

## 2015-01-26 MED ORDER — SODIUM CHLORIDE 0.9 % IV SOLN
Freq: Once | INTRAVENOUS | Status: AC
  Administered 2015-01-26: 15:00:00 via INTRAVENOUS

## 2015-01-26 MED ORDER — PALONOSETRON HCL INJECTION 0.25 MG/5ML
0.2500 mg | Freq: Once | INTRAVENOUS | Status: AC
  Administered 2015-01-26: 0.25 mg via INTRAVENOUS

## 2015-01-26 MED ORDER — HEPARIN SOD (PORK) LOCK FLUSH 100 UNIT/ML IV SOLN
500.0000 [IU] | Freq: Once | INTRAVENOUS | Status: AC | PRN
  Administered 2015-01-26: 500 [IU]
  Filled 2015-01-26: qty 5

## 2015-01-26 NOTE — Patient Instructions (Signed)
[facility] Discharge Instructions for Patients Receiving Chemotherapy  Today you received the following chemotherapy agents abraxane  To help prevent nausea and vomiting after your treatment, we encourage you to take your nausea medication    If you develop nausea and vomiting that is not controlled by your nausea medication, call the clinic.   BELOW ARE SYMPTOMS THAT SHOULD BE REPORTED IMMEDIATELY:  *FEVER GREATER THAN 100.5 F  *CHILLS WITH OR WITHOUT FEVER  NAUSEA AND VOMITING THAT IS NOT CONTROLLED WITH YOUR NAUSEA MEDICATION  *UNUSUAL SHORTNESS OF BREATH  *UNUSUAL BRUISING OR BLEEDING  TENDERNESS IN MOUTH AND THROAT WITH OR WITHOUT PRESENCE OF ULCERS  *URINARY PROBLEMS  *BOWEL PROBLEMS  UNUSUAL RASH Items with * indicate a potential emergency and should be followed up as soon as possible.  Feel free to call the clinic you have any questions or concerns. The clinic phone number is [phone].  Please show the CHEMO ALERT CARD at check-in to the Emergency Department and triage nurse.   

## 2015-02-02 ENCOUNTER — Other Ambulatory Visit (HOSPITAL_BASED_OUTPATIENT_CLINIC_OR_DEPARTMENT_OTHER): Payer: Managed Care, Other (non HMO)

## 2015-02-02 ENCOUNTER — Ambulatory Visit (HOSPITAL_BASED_OUTPATIENT_CLINIC_OR_DEPARTMENT_OTHER): Payer: Managed Care, Other (non HMO)

## 2015-02-02 VITALS — BP 137/75 | HR 90 | Temp 98.3°F | Resp 18

## 2015-02-02 DIAGNOSIS — C50812 Malignant neoplasm of overlapping sites of left female breast: Secondary | ICD-10-CM | POA: Diagnosis not present

## 2015-02-02 DIAGNOSIS — Z5111 Encounter for antineoplastic chemotherapy: Secondary | ICD-10-CM | POA: Diagnosis not present

## 2015-02-02 DIAGNOSIS — C50412 Malignant neoplasm of upper-outer quadrant of left female breast: Secondary | ICD-10-CM

## 2015-02-02 LAB — COMPREHENSIVE METABOLIC PANEL (CC13)
ALT: 20 U/L (ref 0–55)
AST: 25 U/L (ref 5–34)
Albumin: 3.6 g/dL (ref 3.5–5.0)
Alkaline Phosphatase: 65 U/L (ref 40–150)
Anion Gap: 8 mEq/L (ref 3–11)
BUN: 5.4 mg/dL — ABNORMAL LOW (ref 7.0–26.0)
CO2: 28 mEq/L (ref 22–29)
Calcium: 9.2 mg/dL (ref 8.4–10.4)
Chloride: 98 mEq/L (ref 98–109)
Creatinine: 0.5 mg/dL — ABNORMAL LOW (ref 0.6–1.1)
EGFR: >90 mL/min/{1.73_m2} (ref >90)
Glucose: 86 mg/dl (ref 70–140)
Potassium: 3.3 mEq/L — ABNORMAL LOW (ref 3.5–5.1)
Sodium: 134 mEq/L — ABNORMAL LOW (ref 136–145)
Total Bilirubin: 0.24 mg/dL (ref 0.20–1.20)
Total Protein: 6.1 g/dL — ABNORMAL LOW (ref 6.4–8.3)

## 2015-02-02 LAB — CBC WITH DIFFERENTIAL/PLATELET
BASO%: 2.7 % — ABNORMAL HIGH (ref 0.0–2.0)
Basophils Absolute: 0.1 10*3/uL (ref 0.0–0.1)
EOS%: 8 % — ABNORMAL HIGH (ref 0.0–7.0)
Eosinophils Absolute: 0.2 10*3/uL (ref 0.0–0.5)
HCT: 26.3 % — ABNORMAL LOW (ref 34.8–46.6)
HGB: 8.6 g/dL — ABNORMAL LOW (ref 11.6–15.9)
LYMPH%: 13.7 % — ABNORMAL LOW (ref 14.0–49.7)
MCH: 28 pg (ref 25.1–34.0)
MCHC: 32.7 g/dL (ref 31.5–36.0)
MCV: 85.7 fL (ref 79.5–101.0)
MONO#: 0.2 10*3/uL (ref 0.1–0.9)
MONO%: 10.6 % (ref 0.0–14.0)
NEUT#: 1.5 10*3/uL (ref 1.5–6.5)
NEUT%: 65 % (ref 38.4–76.8)
Platelets: 118 10*3/uL — ABNORMAL LOW (ref 145–400)
RBC: 3.07 10*6/uL — ABNORMAL LOW (ref 3.70–5.45)
RDW: 22.8 % — ABNORMAL HIGH (ref 11.2–14.5)
WBC: 2.3 10*3/uL — ABNORMAL LOW (ref 3.9–10.3)
lymph#: 0.3 10*3/uL — ABNORMAL LOW (ref 0.9–3.3)

## 2015-02-02 MED ORDER — SODIUM CHLORIDE 0.9 % IV SOLN
Freq: Once | INTRAVENOUS | Status: AC
  Administered 2015-02-02: 11:00:00 via INTRAVENOUS

## 2015-02-02 MED ORDER — PALONOSETRON HCL INJECTION 0.25 MG/5ML
INTRAVENOUS | Status: AC
  Filled 2015-02-02: qty 5

## 2015-02-02 MED ORDER — PACLITAXEL PROTEIN-BOUND CHEMO INJECTION 100 MG
100.0000 mg/m2 | Freq: Once | INTRAVENOUS | Status: AC
  Administered 2015-02-02: 150 mg via INTRAVENOUS
  Filled 2015-02-02: qty 30

## 2015-02-02 MED ORDER — PALONOSETRON HCL INJECTION 0.25 MG/5ML
0.2500 mg | Freq: Once | INTRAVENOUS | Status: AC
  Administered 2015-02-02: 0.25 mg via INTRAVENOUS

## 2015-02-02 MED ORDER — HEPARIN SOD (PORK) LOCK FLUSH 100 UNIT/ML IV SOLN
500.0000 [IU] | Freq: Once | INTRAVENOUS | Status: AC | PRN
  Administered 2015-02-02: 500 [IU]
  Filled 2015-02-02: qty 5

## 2015-02-02 MED ORDER — SODIUM CHLORIDE 0.9 % IJ SOLN
10.0000 mL | INTRAMUSCULAR | Status: DC | PRN
  Administered 2015-02-02: 10 mL
  Filled 2015-02-02: qty 10

## 2015-02-02 NOTE — Progress Notes (Signed)
Pt reports intermittent pain in her left eye and states that it is a little worse than when last discussed with Dr. Lindi Adie. Milus Mallick NP aware, and would like pt to see her eye doctor. Discussed labs with Nira Conn NP, okay to proceed with treatment today and she will discuss future treatments with Dr. Lindi Adie. Pt encouraged to continue taking her potassium supplements daily as directed and increase foods high in potassium. Pt aware and verbalizes understanding at this time.

## 2015-02-02 NOTE — Patient Instructions (Addendum)
[facility] Discharge Instructions for Patients Receiving Chemotherapy  Today you received the following chemotherapy agents: Abraxane   To help prevent nausea and vomiting after your treatment, we encourage you to take your nausea medication as directed.    If you develop nausea and vomiting that is not controlled by your nausea medication, call the clinic.   BELOW ARE SYMPTOMS THAT SHOULD BE REPORTED IMMEDIATELY:  *FEVER GREATER THAN 100.5 F  *CHILLS WITH OR WITHOUT FEVER  NAUSEA AND VOMITING THAT IS NOT CONTROLLED WITH YOUR NAUSEA MEDICATION  *UNUSUAL SHORTNESS OF BREATH  *UNUSUAL BRUISING OR BLEEDING  TENDERNESS IN MOUTH AND THROAT WITH OR WITHOUT PRESENCE OF ULCERS  *URINARY PROBLEMS  *BOWEL PROBLEMS  UNUSUAL RASH Items with * indicate a potential emergency and should be followed up as soon as possible.  Feel free to call the clinic you have any questions or concerns. The clinic phone number is [phone].  Please show the [facility] at check-in to the Emergency Department and triage nurse.    Hypokalemia Hypokalemia means that the amount of potassium in the blood is lower than normal.Potassium is a chemical, called an electrolyte, that helps regulate the amount of fluid in the body. It also stimulates muscle contraction and helps nerves function properly.Most of the body's potassium is inside of cells, and only a very small amount is in the blood. Because the amount in the blood is so small, minor changes can be life-threatening. CAUSES  Antibiotics.  Diarrhea or vomiting.  Using laxatives too much, which can cause diarrhea.  Chronic kidney disease.  Water pills (diuretics).  Eating disorders (bulimia).  Low magnesium level.  Sweating a lot. SIGNS AND SYMPTOMS  Weakness.  Constipation.  Fatigue.  Muscle cramps.  Mental confusion.  Skipped heartbeats or irregular heartbeat (palpitations).  Tingling or  numbness. DIAGNOSIS  Your health care provider can diagnose hypokalemia with blood tests. In addition to checking your potassium level, your health care provider may also check other lab tests. TREATMENT Hypokalemia can be treated with potassium supplements taken by mouth or adjustments in your current medicines. If your potassium level is very low, you may need to get potassium through a vein (IV) and be monitored in the hospital. A diet high in potassium is also helpful. Foods high in potassium are:  Nuts, such as peanuts and pistachios.  Seeds, such as sunflower seeds and pumpkin seeds.  Peas, lentils, and lima beans.  Whole grain and bran cereals and breads.  Fresh fruit and vegetables, such as apricots, avocado, bananas, cantaloupe, kiwi, oranges, tomatoes, asparagus, and potatoes.  Orange and tomato juices.  Red meats.  Fruit yogurt. HOME CARE INSTRUCTIONS  Take all medicines as prescribed by your health care provider.  Maintain a healthy diet by including nutritious food, such as fruits, vegetables, nuts, whole grains, and lean meats.  If you are taking a laxative, be sure to follow the directions on the label. SEEK MEDICAL CARE IF:  Your weakness gets worse.  You feel your heart pounding or racing.  You are vomiting or having diarrhea.  You are diabetic and having trouble keeping your blood glucose in the normal range. SEEK IMMEDIATE MEDICAL CARE IF:  You have chest pain, shortness of breath, or dizziness.  You are vomiting or having diarrhea for more than 2 days.  You faint. MAKE SURE YOU:   Understand these instructions.  Will watch your condition.  Will get help right away if you are not doing well or  get worse. Document Released: 06/05/2005 Document Revised: 03/26/2013 Document Reviewed: 12/06/2012 [facility] Patient Information 2015 [place], [place]. This information is not intended to replace advice given to you by your health care provider. Make sure  you discuss any questions you have with your health care provider.

## 2015-02-09 ENCOUNTER — Ambulatory Visit (HOSPITAL_BASED_OUTPATIENT_CLINIC_OR_DEPARTMENT_OTHER): Payer: Managed Care, Other (non HMO)

## 2015-02-09 ENCOUNTER — Ambulatory Visit: Payer: Managed Care, Other (non HMO)

## 2015-02-09 ENCOUNTER — Telehealth: Payer: Self-pay | Admitting: *Deleted

## 2015-02-09 ENCOUNTER — Encounter: Payer: Self-pay | Admitting: *Deleted

## 2015-02-09 ENCOUNTER — Ambulatory Visit (HOSPITAL_BASED_OUTPATIENT_CLINIC_OR_DEPARTMENT_OTHER): Payer: Managed Care, Other (non HMO) | Admitting: Hematology and Oncology

## 2015-02-09 ENCOUNTER — Other Ambulatory Visit (HOSPITAL_BASED_OUTPATIENT_CLINIC_OR_DEPARTMENT_OTHER): Payer: Managed Care, Other (non HMO)

## 2015-02-09 ENCOUNTER — Encounter: Payer: Self-pay | Admitting: Hematology and Oncology

## 2015-02-09 VITALS — BP 117/75 | HR 85 | Temp 98.4°F | Resp 18 | Ht 66.0 in | Wt 112.4 lb

## 2015-02-09 DIAGNOSIS — D702 Other drug-induced agranulocytosis: Secondary | ICD-10-CM

## 2015-02-09 DIAGNOSIS — F411 Generalized anxiety disorder: Secondary | ICD-10-CM

## 2015-02-09 DIAGNOSIS — E785 Hyperlipidemia, unspecified: Secondary | ICD-10-CM

## 2015-02-09 DIAGNOSIS — C50812 Malignant neoplasm of overlapping sites of left female breast: Secondary | ICD-10-CM | POA: Diagnosis not present

## 2015-02-09 DIAGNOSIS — C50412 Malignant neoplasm of upper-outer quadrant of left female breast: Secondary | ICD-10-CM

## 2015-02-09 DIAGNOSIS — G620 Drug-induced polyneuropathy: Secondary | ICD-10-CM | POA: Diagnosis not present

## 2015-02-09 DIAGNOSIS — Z5111 Encounter for antineoplastic chemotherapy: Secondary | ICD-10-CM

## 2015-02-09 DIAGNOSIS — D6481 Anemia due to antineoplastic chemotherapy: Secondary | ICD-10-CM | POA: Diagnosis not present

## 2015-02-09 DIAGNOSIS — Z720 Tobacco use: Secondary | ICD-10-CM

## 2015-02-09 LAB — COMPREHENSIVE METABOLIC PANEL (CC13)
ALT: 21 U/L (ref 0–55)
AST: 26 U/L (ref 5–34)
Albumin: 3.7 g/dL (ref 3.5–5.0)
Alkaline Phosphatase: 64 U/L (ref 40–150)
Anion Gap: 10 meq/L (ref 3–11)
BUN: 6.9 mg/dL — ABNORMAL LOW (ref 7.0–26.0)
CO2: 27 meq/L (ref 22–29)
Calcium: 9.2 mg/dL (ref 8.4–10.4)
Chloride: 100 meq/L (ref 98–109)
Creatinine: 0.6 mg/dL (ref 0.6–1.1)
EGFR: >90 ml/min/1.73 m2
Glucose: 82 mg/dL (ref 70–140)
Potassium: 3.3 meq/L — ABNORMAL LOW (ref 3.5–5.1)
Sodium: 136 meq/L (ref 136–145)
Total Bilirubin: 0.31 mg/dL (ref 0.20–1.20)
Total Protein: 6.2 g/dL — ABNORMAL LOW (ref 6.4–8.3)

## 2015-02-09 LAB — CBC WITH DIFFERENTIAL/PLATELET
BASO%: 2 % (ref 0.0–2.0)
Basophils Absolute: 0.1 10*3/uL (ref 0.0–0.1)
EOS%: 7.9 % — ABNORMAL HIGH (ref 0.0–7.0)
Eosinophils Absolute: 0.2 10*3/uL (ref 0.0–0.5)
HCT: 27.6 % — ABNORMAL LOW (ref 34.8–46.6)
HGB: 9.1 g/dL — ABNORMAL LOW (ref 11.6–15.9)
LYMPH%: 15.2 % (ref 14.0–49.7)
MCH: 28.5 pg (ref 25.1–34.0)
MCHC: 32.9 g/dL (ref 31.5–36.0)
MCV: 86.6 fL (ref 79.5–101.0)
MONO#: 0.3 10*3/uL (ref 0.1–0.9)
MONO%: 11.7 % (ref 0.0–14.0)
NEUT#: 1.6 10*3/uL (ref 1.5–6.5)
NEUT%: 63.2 % (ref 38.4–76.8)
Platelets: 159 10*3/uL (ref 145–400)
RBC: 3.19 10*6/uL — ABNORMAL LOW (ref 3.70–5.45)
RDW: 25.6 % — ABNORMAL HIGH (ref 11.2–14.5)
WBC: 2.5 10*3/uL — ABNORMAL LOW (ref 3.9–10.3)
lymph#: 0.4 10*3/uL — ABNORMAL LOW (ref 0.9–3.3)

## 2015-02-09 MED ORDER — POTASSIUM CHLORIDE ER 10 MEQ PO CPCR: 20.0000 meq | ORAL_CAPSULE | Freq: Two times a day (BID) | ORAL | Status: DC

## 2015-02-09 MED ORDER — AMOXICILLIN 500 MG PO CAPS: 500.0000 mg | ORAL_CAPSULE | Freq: Two times a day (BID) | ORAL | Status: DC

## 2015-02-09 MED ORDER — PALONOSETRON HCL INJECTION 0.25 MG/5ML
INTRAVENOUS | Status: AC
  Filled 2015-02-09: qty 5

## 2015-02-09 MED ORDER — PACLITAXEL PROTEIN-BOUND CHEMO INJECTION 100 MG
100.0000 mg/m2 | Freq: Once | INTRAVENOUS | Status: AC
  Administered 2015-02-09: 150 mg via INTRAVENOUS
  Filled 2015-02-09: qty 30

## 2015-02-09 MED ORDER — SODIUM CHLORIDE 0.9 % IV SOLN
Freq: Once | INTRAVENOUS | Status: AC
  Administered 2015-02-09: 13:00:00 via INTRAVENOUS

## 2015-02-09 MED ORDER — HEPARIN SOD (PORK) LOCK FLUSH 100 UNIT/ML IV SOLN
500.0000 [IU] | Freq: Once | INTRAVENOUS | Status: AC | PRN
  Administered 2015-02-09: 500 [IU]
  Filled 2015-02-09: qty 5

## 2015-02-09 MED ORDER — PALONOSETRON HCL INJECTION 0.25 MG/5ML
0.2500 mg | Freq: Once | INTRAVENOUS | Status: AC
  Administered 2015-02-09: 0.25 mg via INTRAVENOUS

## 2015-02-09 MED ORDER — SODIUM CHLORIDE 0.9 % IJ SOLN
10.0000 mL | INTRAMUSCULAR | Status: DC | PRN
  Administered 2015-02-09: 10 mL
  Filled 2015-02-09: qty 10

## 2015-02-09 NOTE — Progress Notes (Signed)
Patient Care Team: Chipper Herb, MD as PCP - General (Family Medicine) Dr Geanie Berlin (Gynecology) Holley Bouche, NP as Nurse Practitioner (Nurse Practitioner)  DIAGNOSIS: Breast cancer of upper-outer quadrant of left female breast   Staging form: Breast, AJCC 7th Edition     Clinical stage from 11/18/2014: Stage IIB (T3, N0, M0) - Unsigned   SUMMARY OF ONCOLOGIC HISTORY:   Breast cancer of upper-outer quadrant of left female breast   11/09/2014 Mammogram Left breast irregular hypoechoic mass 12:00 position 5.3 x 3.7 x 2.9 cm, left low axilla normal sized lymph nodes   11/09/2014 Initial Diagnosis Left Bx: IDC with DCIS; Grade 3, Er 0%, PR 0%, Her 2 Neg, Ki 67: 70%   11/30/2014 -  Neo-Adjuvant Chemotherapy Dose dense Adriamycin and Cytoxan 4 followed by Abraxane weekly 12    CHIEF COMPLIANT: cycle 5/12 Abraxane  INTERVAL HISTORY: Sherry Bender is a 64 year old with above-mentioned history of left breast cancer currently on weekly Abraxane neo-adjuvant chemotherapy. She had done fairly well from Abraxane standpoint but she started to notice stinging and numbness of tips of the fingers is not persistent it comes and goes. She is been eating well and her weight has been stable. She denies any nausea or vomiting. She also complains of cough with whitish expectoration. Denies any fevers or chills  REVIEW OF SYSTEMS:   Constitutional: Denies fevers, chills or abnormal weight loss Eyes: Denies blurriness of vision Ears, nose, mouth, throat, and face: Denies mucositis or sore throat Respiratory: cough with white expectoration Cardiovascular: Denies palpitation, chest discomfort or lower extremity swelling Gastrointestinal:  Denies nausea, heartburn or change in bowel habits Skin: Denies abnormal skin rashes Lymphatics: Denies new lymphadenopathy or easy bruising Neurological:tingling and numbness of the tips of the fingers Behavioral/Psych: Mood is stable, no new changes  All other systems were  reviewed with the patient and are negative.  I have reviewed the past medical history, past surgical history, social history and family history with the patient and they are unchanged from previous note.  ALLERGIES:  is allergic to codeine.  MEDICATIONS:  Current Outpatient Prescriptions  Medication Sig Dispense Refill  . dexamethasone (DECADRON) 4 MG tablet TAKE 1 TABLET ON THE DAY AFTER CHEMOTHERAPY AND THEN 1 TABLET TWICE A DAY FOR 2 DAYS WITH FOOD  1  . dexlansoprazole (DEXILANT) 60 MG capsule Take 1 capsule (60 mg total) by mouth daily. 30 capsule 4  . diazepam (VALIUM) 5 MG tablet Take 1 tablet (5 mg total) by mouth every 6 (six) hours as needed for anxiety. 30 tablet 0  . estradiol (ESTRACE) 1 MG tablet Take 1 mg by mouth daily.      . hydrochlorothiazide (HYDRODIURIL) 25 MG tablet Take 1 tablet (25 mg total) by mouth daily. 90 tablet 0  . HYDROcodone-acetaminophen (NORCO/VICODIN) 5-325 MG per tablet     . LORazepam (ATIVAN) 0.5 MG tablet Take 0.5 mg by mouth at bedtime.  0  . methylPREDNISolone (MEDROL DOSEPAK) 4 MG TBPK tablet Take as directed 21 tablet 0  . Multiple Vitamin (MULTIVITAMIN) tablet Take 1 tablet by mouth daily.    . ondansetron (ZOFRAN) 8 MG tablet TAKE 1 TABLET BY MOUTH TWICE A DAY AS NEEDED,START ON THE 3RD DAY AFTER CHEMOTHERAPY  1  . Phenazopyridine HCl (AZO TABS PO) Take 1 tablet by mouth.    . potassium chloride (MICRO-K) 10 MEQ CR capsule Take 2 capsules (20 mEq total) by mouth 2 (two) times daily. 30 capsule 1  . prochlorperazine (COMPAZINE) 10  MG tablet TAKE 1 TABLET (10 MG TOTAL) BY MOUTH EVERY 6 (SIX) HOURS AS NEEDED (NAUSEA OR VOMITING).  1   No current facility-administered medications for this visit.    PHYSICAL EXAMINATION: ECOG PERFORMANCE STATUS: 1 - Symptomatic but completely ambulatory  Filed Vitals:   02/09/15 1050  BP: 117/75  Pulse: 85  Temp: 98.4 F (36.9 C)  Resp: 18   Filed Weights   02/09/15 1050  Weight: 112 lb 6.4 oz (50.984  kg)    GENERAL:alert, no distress and comfortable SKIN: skin color, texture, turgor are normal, no rashes or significant lesions EYES: normal, Conjunctiva are pink and non-injected, sclera clear OROPHARYNX:no exudate, no erythema and lips, buccal mucosa, and tongue normal  NECK: supple, thyroid normal size, non-tender, without nodularity LYMPH:  no palpable lymphadenopathy in the cervical, axillary or inguinal LUNGS: bronchitis HEART: regular rate & rhythm and no murmurs and no lower extremity edema ABDOMEN:abdomen soft, non-tender and normal bowel sounds Musculoskeletal:no cyanosis of digits and no clubbing  NEURO: alert & oriented x 3 with fluent speech, grade 1 neuropathy   LABORATORY DATA:  I have reviewed the data as listed   Chemistry      Component Value Date/Time   NA 136 02/09/2015 1027   NA 133* 06/20/2014 2042   K 3.3* 02/09/2015 1027   K 3.4* 06/20/2014 2042   CL 97 06/20/2014 2042   CO2 27 02/09/2015 1027   CO2 22 06/20/2014 2042   BUN 6.9* 02/09/2015 1027   BUN 8 06/20/2014 2042   CREATININE 0.6 02/09/2015 1027   CREATININE 0.37* 06/20/2014 2042      Component Value Date/Time   CALCIUM 9.2 02/09/2015 1027   CALCIUM 9.1 06/20/2014 2042   ALKPHOS 64 02/09/2015 1027   AST 26 02/09/2015 1027   ALT 21 02/09/2015 1027   BILITOT 0.31 02/09/2015 1027       Lab Results  Component Value Date   WBC 2.5* 02/09/2015   HGB 9.1* 02/09/2015   HCT 27.6* 02/09/2015   MCV 86.6 02/09/2015   PLT 159 02/09/2015   NEUTROABS 1.6 02/09/2015   ASSESSMENT & PLAN:  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 Neo-adjuvant chemotherapy with Adriamycin and Cytoxan every 2 weeks 4 followed by Abraxane weekly  12 2. Followed by surgery which could be with mastectomy versus lumpectomy (patient may need biopsies to demonstrate extent of disease after neo-adjuvant chemotherapy) 3. Following surgery she may need adjuvant radiation if she undergoes lumpectomy  Current treatment: cycle 5/12 of Abraxane (patient could not receive cycle 4 Adriamycin and Cytoxan because of fixed drug eruption)  Chemotherapy toxicities: 1. Frequent urination: improved 2. Bone pain: Related to Neulasta. Improvement with Claritin. 3. Neutropenia ANC 1.4: Expected for day 8 chemotherapy. No need of dose changes 4. Normocytic anemia: Hemoglobin 9.3 prior to starting chemotherapy she was anemic with a hemoglobin of 10.6 5. Hypokalemia: Potassium is 3 and I ordered oral potassium replacement 20 mEq daily 12/15/2014 and it improved to 3.4. Decreased potassium to once daily, with the decrease in potassium to 2.9, we will increase the dosage of potassium to 40 mEq daily 01/12/2015 6. Hot flashes for 24 hours after chemotherapy 7. Alopecia 8. Rash on the  hands: itching related to chemotherapy with Adriamycin and Cytoxan. Fourth cycle of chemotherapy was not given for this reason. Patient is using Benadryl. We've previously treated her with Solu-Medrol. Marked improvement in the rash since last week. 9. Watering eyes and nose: patient takes Claritin. 10. Bronchitis 02/09/2015: Prescribed amoxicillin 11. Neuropathy grade 1: We'll continue to watch and monitor  Return to clinic in 2 weeks for week 7/12 Abraxane and for toxicity evaluation.   No orders of the defined types were placed in this encounter.   The patient has a good understanding of the overall plan. she agrees with it. she will call with any problems that may develop before the next visit here.   Rulon Eisenmenger, MD

## 2015-02-09 NOTE — Assessment & Plan Note (Signed)
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 Neo-adjuvant chemotherapy with Adriamycin and Cytoxan every 2 weeks 4 followed by Abraxane weekly 12 2. Followed by surgery which could be with mastectomy versus lumpectomy (patient may need biopsies to demonstrate extent of disease after neo-adjuvant chemotherapy) 3. Following surgery she may need adjuvant radiation if she undergoes lumpectomy  Current treatment: cycle 5/12 of Abraxane (patient could not receive cycle 4 Adriamycin and Cytoxan because of fixed drug eruption)  Chemotherapy toxicities: 1. Frequent urination: improved 2. Bone pain: Related to Neulasta. Improvement with Claritin. 3. Neutropenia ANC 1.4: Expected for day 8 chemotherapy. No need of dose changes 4. Normocytic anemia: Hemoglobin 9.3 prior to starting chemotherapy she was anemic with a hemoglobin of 10.6 5. Hypokalemia: Potassium is 3 and I ordered oral potassium replacement 20 mEq daily 12/15/2014 and it improved to 3.4. Decreased potassium to once daily, with the decrease in potassium to 2.9, we will increase the dosage of potassium to 40 mEq daily 01/12/2015 6. Hot flashes for 24 hours after chemotherapy 7. Alopecia 8. Rash on the hands: itching related to chemotherapy with Adriamycin and Cytoxan. Fourth cycle of chemotherapy was not given for this reason. Patient is using Benadryl. We've previously treated her with Solu-Medrol. Marked improvement in the rash since last week. 9. Watering eyes and nose: patient takes Claritin.  Return to clinic in 2 weeks for week 7/12 Abraxane and for toxicity evaluation and monitoring of the rash.

## 2015-02-09 NOTE — Telephone Encounter (Signed)
Per MD pt is ok to treat with abraxane with WBC 2.5 and ANC of 1.6.

## 2015-02-09 NOTE — Patient Instructions (Signed)
[facility] Discharge Instructions for Patients Receiving Chemotherapy  Today you received the following chemotherapy agents abraxane  To help prevent nausea and vomiting after your treatment, we encourage you to take your nausea medication    If you develop nausea and vomiting that is not controlled by your nausea medication, call the clinic.   BELOW ARE SYMPTOMS THAT SHOULD BE REPORTED IMMEDIATELY:  *FEVER GREATER THAN 100.5 F  *CHILLS WITH OR WITHOUT FEVER  NAUSEA AND VOMITING THAT IS NOT CONTROLLED WITH YOUR NAUSEA MEDICATION  *UNUSUAL SHORTNESS OF BREATH  *UNUSUAL BRUISING OR BLEEDING  TENDERNESS IN MOUTH AND THROAT WITH OR WITHOUT PRESENCE OF ULCERS  *URINARY PROBLEMS  *BOWEL PROBLEMS  UNUSUAL RASH Items with * indicate a potential emergency and should be followed up as soon as possible.  Feel free to call the clinic you have any questions or concerns. The clinic phone number is [phone].  Please show the CHEMO ALERT CARD at check-in to the Emergency Department and triage nurse.   

## 2015-02-12 ENCOUNTER — Telehealth: Payer: Self-pay | Admitting: Family Medicine

## 2015-02-12 NOTE — Telephone Encounter (Signed)
Pt called in for an apt she states that she has a sinus infection. I offered the pt an apt this afternoon with Dr. Laurance Flatten pt declined, I then offered the pt an apt tomorrow 8/27 with MMM and pt declined, stating that she wanted to wait till Monday and see Dr. Livia Snellen. I offered the pt and apt on Monday and got her schedule with Dr Livia Snellen and then the pt remembered that someone was suppose to come and work on her refrigerator Monday afternoon and she would have to cancel the Monday apt for now. I advised pt that if she thought she was sick she really needed to come in to be seen and not wait the whole weekend. PT declined once again.

## 2015-02-15 ENCOUNTER — Ambulatory Visit: Payer: Managed Care, Other (non HMO) | Admitting: Family Medicine

## 2015-02-16 ENCOUNTER — Ambulatory Visit: Payer: Managed Care, Other (non HMO)

## 2015-02-16 ENCOUNTER — Other Ambulatory Visit: Payer: Self-pay | Admitting: Hematology and Oncology

## 2015-02-16 ENCOUNTER — Ambulatory Visit: Payer: Managed Care, Other (non HMO) | Admitting: Hematology and Oncology

## 2015-02-16 ENCOUNTER — Other Ambulatory Visit (HOSPITAL_BASED_OUTPATIENT_CLINIC_OR_DEPARTMENT_OTHER): Payer: Managed Care, Other (non HMO)

## 2015-02-16 ENCOUNTER — Ambulatory Visit (HOSPITAL_BASED_OUTPATIENT_CLINIC_OR_DEPARTMENT_OTHER): Payer: Managed Care, Other (non HMO)

## 2015-02-16 VITALS — BP 139/65 | HR 83 | Temp 98.1°F | Resp 18

## 2015-02-16 DIAGNOSIS — C50412 Malignant neoplasm of upper-outer quadrant of left female breast: Secondary | ICD-10-CM

## 2015-02-16 DIAGNOSIS — C50812 Malignant neoplasm of overlapping sites of left female breast: Secondary | ICD-10-CM

## 2015-02-16 DIAGNOSIS — Z5111 Encounter for antineoplastic chemotherapy: Secondary | ICD-10-CM

## 2015-02-16 LAB — CBC WITH DIFFERENTIAL/PLATELET
BASO%: 1 % (ref 0.0–2.0)
Basophils Absolute: 0 10*3/uL (ref 0.0–0.1)
EOS%: 6.1 % (ref 0.0–7.0)
Eosinophils Absolute: 0.1 10*3/uL (ref 0.0–0.5)
HCT: 26 % — ABNORMAL LOW (ref 34.8–46.6)
HGB: 8.6 g/dL — ABNORMAL LOW (ref 11.6–15.9)
LYMPH%: 19.8 % (ref 14.0–49.7)
MCH: 29.3 pg (ref 25.1–34.0)
MCHC: 33.1 g/dL (ref 31.5–36.0)
MCV: 88.4 fL (ref 79.5–101.0)
MONO#: 0.2 10*3/uL (ref 0.1–0.9)
MONO%: 10.2 % (ref 0.0–14.0)
NEUT#: 1.2 10*3/uL — ABNORMAL LOW (ref 1.5–6.5)
NEUT%: 62.9 % (ref 38.4–76.8)
Platelets: 120 10*3/uL — ABNORMAL LOW (ref 145–400)
RBC: 2.94 10*6/uL — ABNORMAL LOW (ref 3.70–5.45)
RDW: 21.7 % — ABNORMAL HIGH (ref 11.2–14.5)
WBC: 2 10*3/uL — ABNORMAL LOW (ref 3.9–10.3)
lymph#: 0.4 10*3/uL — ABNORMAL LOW (ref 0.9–3.3)
nRBC: 0 % (ref 0–0)

## 2015-02-16 LAB — COMPREHENSIVE METABOLIC PANEL (CC13)
ALT: 21 U/L (ref 0–55)
AST: 27 U/L (ref 5–34)
Albumin: 3.7 g/dL (ref 3.5–5.0)
Alkaline Phosphatase: 70 U/L (ref 40–150)
Anion Gap: 8 mEq/L (ref 3–11)
BUN: 7.9 mg/dL (ref 7.0–26.0)
CO2: 29 mEq/L (ref 22–29)
Calcium: 9.3 mg/dL (ref 8.4–10.4)
Chloride: 99 mEq/L (ref 98–109)
Creatinine: 0.5 mg/dL — ABNORMAL LOW (ref 0.6–1.1)
EGFR: >90 mL/min/{1.73_m2} (ref >90)
Glucose: 81 mg/dl (ref 70–140)
Potassium: 3.5 mEq/L (ref 3.5–5.1)
Sodium: 136 mEq/L (ref 136–145)
Total Bilirubin: 0.28 mg/dL (ref 0.20–1.20)
Total Protein: 6.4 g/dL (ref 6.4–8.3)

## 2015-02-16 MED ORDER — HEPARIN SOD (PORK) LOCK FLUSH 100 UNIT/ML IV SOLN
500.0000 [IU] | Freq: Once | INTRAVENOUS | Status: AC | PRN
  Administered 2015-02-16: 500 [IU]
  Filled 2015-02-16: qty 5

## 2015-02-16 MED ORDER — SODIUM CHLORIDE 0.9 % IJ SOLN
10.0000 mL | INTRAMUSCULAR | Status: DC | PRN
  Administered 2015-02-16: 10 mL
  Filled 2015-02-16: qty 10

## 2015-02-16 MED ORDER — PALONOSETRON HCL INJECTION 0.25 MG/5ML
INTRAVENOUS | Status: AC
  Filled 2015-02-16: qty 5

## 2015-02-16 MED ORDER — PACLITAXEL PROTEIN-BOUND CHEMO INJECTION 100 MG
75.0000 mg/m2 | Freq: Once | INTRAVENOUS | Status: AC
Start: 2015-02-16 — End: 2015-02-16
  Administered 2015-02-16: 125 mg via INTRAVENOUS
  Filled 2015-02-16: qty 25

## 2015-02-16 MED ORDER — SODIUM CHLORIDE 0.9 % IV SOLN
Freq: Once | INTRAVENOUS | Status: AC
  Administered 2015-02-16: 12:00:00 via INTRAVENOUS

## 2015-02-16 MED ORDER — PALONOSETRON HCL INJECTION 0.25 MG/5ML
0.2500 mg | Freq: Once | INTRAVENOUS | Status: AC
  Administered 2015-02-16: 0.25 mg via INTRAVENOUS

## 2015-02-16 NOTE — Progress Notes (Signed)
Per Dr. Lindi Adie, okay to treat pt with ANC 1.2.  Pt will receive dose reduction today.  Neutropenia and proper precautions reviewed with patient and pt verbalized understanding.  Pt sent home with some masks.  No further questions or concerns at time of discharge.

## 2015-02-16 NOTE — Patient Instructions (Signed)
[facility] Discharge Instructions for Patients Receiving Chemotherapy  Today you received the following chemotherapy agents: Abraxane   To help prevent nausea and vomiting after your treatment, we encourage you to take your nausea medication as directed.    If you develop nausea and vomiting that is not controlled by your nausea medication, call the clinic.   BELOW ARE SYMPTOMS THAT SHOULD BE REPORTED IMMEDIATELY:  *FEVER GREATER THAN 100.5 F  *CHILLS WITH OR WITHOUT FEVER  NAUSEA AND VOMITING THAT IS NOT CONTROLLED WITH YOUR NAUSEA MEDICATION  *UNUSUAL SHORTNESS OF BREATH  *UNUSUAL BRUISING OR BLEEDING  TENDERNESS IN MOUTH AND THROAT WITH OR WITHOUT PRESENCE OF ULCERS  *URINARY PROBLEMS  *BOWEL PROBLEMS  UNUSUAL RASH Items with * indicate a potential emergency and should be followed up as soon as possible.  Feel free to call the clinic you have any questions or concerns. The clinic phone number is [phone].  Please show the CHEMO ALERT CARD at check-in to the Emergency Department and triage nurse.   

## 2015-02-23 ENCOUNTER — Ambulatory Visit (HOSPITAL_BASED_OUTPATIENT_CLINIC_OR_DEPARTMENT_OTHER): Payer: Managed Care, Other (non HMO)

## 2015-02-23 ENCOUNTER — Ambulatory Visit (HOSPITAL_BASED_OUTPATIENT_CLINIC_OR_DEPARTMENT_OTHER): Payer: Managed Care, Other (non HMO) | Admitting: Hematology and Oncology

## 2015-02-23 ENCOUNTER — Other Ambulatory Visit (HOSPITAL_BASED_OUTPATIENT_CLINIC_OR_DEPARTMENT_OTHER): Payer: Managed Care, Other (non HMO)

## 2015-02-23 ENCOUNTER — Telehealth: Payer: Self-pay | Admitting: Hematology and Oncology

## 2015-02-23 ENCOUNTER — Ambulatory Visit: Payer: Managed Care, Other (non HMO)

## 2015-02-23 ENCOUNTER — Encounter: Payer: Self-pay | Admitting: Hematology and Oncology

## 2015-02-23 VITALS — BP 138/51 | HR 91 | Temp 98.1°F | Resp 18 | Ht 66.0 in | Wt 111.1 lb

## 2015-02-23 DIAGNOSIS — C50812 Malignant neoplasm of overlapping sites of left female breast: Secondary | ICD-10-CM

## 2015-02-23 DIAGNOSIS — E876 Hypokalemia: Secondary | ICD-10-CM | POA: Diagnosis not present

## 2015-02-23 DIAGNOSIS — C50412 Malignant neoplasm of upper-outer quadrant of left female breast: Secondary | ICD-10-CM

## 2015-02-23 DIAGNOSIS — D649 Anemia, unspecified: Secondary | ICD-10-CM

## 2015-02-23 DIAGNOSIS — R21 Rash and other nonspecific skin eruption: Secondary | ICD-10-CM

## 2015-02-23 DIAGNOSIS — Z5111 Encounter for antineoplastic chemotherapy: Secondary | ICD-10-CM

## 2015-02-23 LAB — COMPREHENSIVE METABOLIC PANEL (CC13)
ALT: 26 U/L (ref 0–55)
AST: 33 U/L (ref 5–34)
Albumin: 3.8 g/dL (ref 3.5–5.0)
Alkaline Phosphatase: 70 U/L (ref 40–150)
Anion Gap: 7 mEq/L (ref 3–11)
BUN: 6.2 mg/dL — ABNORMAL LOW (ref 7.0–26.0)
CO2: 30 mEq/L — ABNORMAL HIGH (ref 22–29)
Calcium: 9.5 mg/dL (ref 8.4–10.4)
Chloride: 98 mEq/L (ref 98–109)
Creatinine: 0.5 mg/dL — ABNORMAL LOW (ref 0.6–1.1)
EGFR: >90 mL/min/{1.73_m2} (ref >90)
Glucose: 92 mg/dl (ref 70–140)
Potassium: 3.6 mEq/L (ref 3.5–5.1)
Sodium: 136 mEq/L (ref 136–145)
Total Bilirubin: 0.3 mg/dL (ref 0.20–1.20)
Total Protein: 6.5 g/dL (ref 6.4–8.3)

## 2015-02-23 LAB — CBC WITH DIFFERENTIAL/PLATELET
BASO%: 1.2 % (ref 0.0–2.0)
Basophils Absolute: 0 10*3/uL (ref 0.0–0.1)
EOS%: 8.4 % — ABNORMAL HIGH (ref 0.0–7.0)
Eosinophils Absolute: 0.1 10*3/uL (ref 0.0–0.5)
HCT: 27.2 % — ABNORMAL LOW (ref 34.8–46.6)
HGB: 9 g/dL — ABNORMAL LOW (ref 11.6–15.9)
LYMPH%: 22.8 % (ref 14.0–49.7)
MCH: 29.9 pg (ref 25.1–34.0)
MCHC: 33.1 g/dL (ref 31.5–36.0)
MCV: 90.4 fL (ref 79.5–101.0)
MONO#: 0.2 10*3/uL (ref 0.1–0.9)
MONO%: 10.8 % (ref 0.0–14.0)
NEUT#: 1 10*3/uL — ABNORMAL LOW (ref 1.5–6.5)
NEUT%: 56.8 % (ref 38.4–76.8)
Platelets: 124 10*3/uL — ABNORMAL LOW (ref 145–400)
RBC: 3.01 10*6/uL — ABNORMAL LOW (ref 3.70–5.45)
RDW: 20.7 % — ABNORMAL HIGH (ref 11.2–14.5)
WBC: 1.7 10*3/uL — ABNORMAL LOW (ref 3.9–10.3)
lymph#: 0.4 10*3/uL — ABNORMAL LOW (ref 0.9–3.3)

## 2015-02-23 MED ORDER — HEPARIN SOD (PORK) LOCK FLUSH 100 UNIT/ML IV SOLN
500.0000 [IU] | Freq: Once | INTRAVENOUS | Status: AC | PRN
  Administered 2015-02-23: 500 [IU]
  Filled 2015-02-23: qty 5

## 2015-02-23 MED ORDER — PACLITAXEL PROTEIN-BOUND CHEMO INJECTION 100 MG
65.0000 mg/m2 | Freq: Once | INTRAVENOUS | Status: AC
  Administered 2015-02-23: 100 mg via INTRAVENOUS
  Filled 2015-02-23: qty 20

## 2015-02-23 MED ORDER — PALONOSETRON HCL INJECTION 0.25 MG/5ML
INTRAVENOUS | Status: AC
  Filled 2015-02-23: qty 5

## 2015-02-23 MED ORDER — PALONOSETRON HCL INJECTION 0.25 MG/5ML
0.2500 mg | Freq: Once | INTRAVENOUS | Status: AC
  Administered 2015-02-23: 0.25 mg via INTRAVENOUS

## 2015-02-23 MED ORDER — SODIUM CHLORIDE 0.9 % IV SOLN
Freq: Once | INTRAVENOUS | Status: AC
  Administered 2015-02-23: 13:00:00 via INTRAVENOUS

## 2015-02-23 MED ORDER — SODIUM CHLORIDE 0.9 % IJ SOLN
10.0000 mL | INTRAMUSCULAR | Status: DC | PRN
  Administered 2015-02-23: 10 mL
  Filled 2015-02-23: qty 10

## 2015-02-23 MED ORDER — POTASSIUM CHLORIDE ER 10 MEQ PO CPCR: 10.0000 meq | ORAL_CAPSULE | Freq: Every day | ORAL | Status: DC

## 2015-02-23 NOTE — Progress Notes (Signed)
Patient Care Team: Chipper Herb, MD as PCP - General (Family Medicine) Dr Geanie Berlin (Gynecology) Holley Bouche, NP as Nurse Practitioner (Nurse Practitioner)  DIAGNOSIS: Breast cancer of upper-outer quadrant of left female breast   Staging form: Breast, AJCC 7th Edition     Clinical stage from 11/18/2014: Stage IIB (T3, N0, M0) - Unsigned   SUMMARY OF ONCOLOGIC HISTORY:   Breast cancer of upper-outer quadrant of left female breast   11/09/2014 Mammogram Left breast irregular hypoechoic mass 12:00 position 5.3 x 3.7 x 2.9 cm, left low axilla normal sized lymph nodes   11/09/2014 Initial Diagnosis Left Bx: IDC with DCIS; Grade 3, Er 0%, PR 0%, Her 2 Neg, Ki 67: 70%   11/30/2014 -  Neo-Adjuvant Chemotherapy Dose dense Adriamycin and Cytoxan 4 followed by Abraxane weekly 12    CHIEF COMPLIANT: cycle 7/12 Abraxane  INTERVAL HISTORY: Sherry Bender is a 64 year old with above-mentioned history left breast cancer who is here for cycle 7 of Abraxane. She continues to feel mildly weak and fatigued. Neuropathy has gotten better since the first dose reduction. Today she is here to with the Gap of 1000. He she denies any fevers or chills. Denies any nausea vomiting. She does feel fatigued.  REVIEW OF SYSTEMS:   Constitutional: Denies fevers, chills or abnormal weight loss Eyes: Denies blurriness of vision Ears, nose, mouth, throat, and face: Denies mucositis or sore throat Respiratory: Denies cough, dyspnea or wheezes Cardiovascular: Denies palpitation, chest discomfort or lower extremity swelling Gastrointestinal:  Denies nausea, heartburn or change in bowel habits Skin: Denies abnormal skin rashes Lymphatics: Denies new lymphadenopathy or easy bruising Neurological:Denies numbness, tingling or new weaknesses Behavioral/Psych: Mood is stable, no new changes   All other systems were reviewed with the patient and are negative.  I have reviewed the past medical history, past surgical history,  social history and family history with the patient and they are unchanged from previous note.  ALLERGIES:  is allergic to codeine.  MEDICATIONS:  Current Outpatient Prescriptions  Medication Sig Dispense Refill  . dexlansoprazole (DEXILANT) 60 MG capsule Take 1 capsule (60 mg total) by mouth daily. 30 capsule 4  . estradiol (ESTRACE) 1 MG tablet Take 1 mg by mouth daily.      . hydrochlorothiazide (HYDRODIURIL) 25 MG tablet Take 1 tablet (25 mg total) by mouth daily. 90 tablet 0  . Multiple Vitamin (MULTIVITAMIN) tablet Take 1 tablet by mouth daily.    . Phenazopyridine HCl (AZO TABS PO) Take 1 tablet by mouth.    . potassium chloride (MICRO-K) 10 MEQ CR capsule Take 1 capsule (10 mEq total) by mouth daily. 30 capsule 1  . dexamethasone (DECADRON) 4 MG tablet TAKE 1 TABLET ON THE DAY AFTER CHEMOTHERAPY AND THEN 1 TABLET TWICE A DAY FOR 2 DAYS WITH FOOD  1  . diazepam (VALIUM) 5 MG tablet Take 1 tablet (5 mg total) by mouth every 6 (six) hours as needed for anxiety. (Patient not taking: Reported on 02/23/2015) 30 tablet 0  . HYDROcodone-acetaminophen (NORCO/VICODIN) 5-325 MG per tablet     . LORazepam (ATIVAN) 0.5 MG tablet Take 0.5 mg by mouth at bedtime.  0  . ondansetron (ZOFRAN) 8 MG tablet TAKE 1 TABLET BY MOUTH TWICE A DAY AS NEEDED,START ON THE 3RD DAY AFTER CHEMOTHERAPY  1  . prochlorperazine (COMPAZINE) 10 MG tablet TAKE 1 TABLET (10 MG TOTAL) BY MOUTH EVERY 6 (SIX) HOURS AS NEEDED (NAUSEA OR VOMITING).  1   No current facility-administered medications for  this visit.   Facility-Administered Medications Ordered in Other Visits  Medication Dose Route Frequency Provider Last Rate Last Dose  . heparin lock flush 100 unit/mL  500 Units Intracatheter Once PRN Nicholas Lose, MD      . PACLitaxel-protein bound (ABRAXANE) chemo infusion 100 mg  65 mg/m2 (Treatment Plan Actual) Intravenous Once Nicholas Lose, MD      . sodium chloride 0.9 % injection 10 mL  10 mL Intracatheter PRN Nicholas Lose,  MD        PHYSICAL EXAMINATION: ECOG PERFORMANCE STATUS: 1 - Symptomatic but completely ambulatory  Filed Vitals:   02/23/15 1124  BP: 138/51  Pulse: 91  Temp: 98.1 F (36.7 C)  Resp: 18   Filed Weights   02/23/15 1124  Weight: 111 lb 1.6 oz (50.395 kg)    GENERAL:alert, no distress and comfortable SKIN: skin color, texture, turgor are normal, no rashes or significant lesions EYES: normal, Conjunctiva are pink and non-injected, sclera clear OROPHARYNX:no exudate, no erythema and lips, buccal mucosa, and tongue normal  NECK: supple, thyroid normal size, non-tender, without nodularity LYMPH:  no palpable lymphadenopathy in the cervical, axillary or inguinal LUNGS: clear to auscultation and percussion with normal breathing effort HEART: regular rate & rhythm and no murmurs and no lower extremity edema ABDOMEN:abdomen soft, non-tender and normal bowel sounds Musculoskeletal:no cyanosis of digits and no clubbing  NEURO: alert & oriented x 3 with fluent speech, neuropathy much improved  LABORATORY DATA:  I have reviewed the data as listed   Chemistry      Component Value Date/Time   NA 136 02/23/2015 1104   NA 133* 06/20/2014 2042   K 3.6 02/23/2015 1104   K 3.4* 06/20/2014 2042   CL 97 06/20/2014 2042   CO2 30* 02/23/2015 1104   CO2 22 06/20/2014 2042   BUN 6.2* 02/23/2015 1104   BUN 8 06/20/2014 2042   CREATININE 0.5* 02/23/2015 1104   CREATININE 0.37* 06/20/2014 2042      Component Value Date/Time   CALCIUM 9.5 02/23/2015 1104   CALCIUM 9.1 06/20/2014 2042   ALKPHOS 70 02/23/2015 1104   AST 33 02/23/2015 1104   ALT 26 02/23/2015 1104   BILITOT 0.30 02/23/2015 1104       Lab Results  Component Value Date   WBC 1.7* 02/23/2015   HGB 9.0* 02/23/2015   HCT 27.2* 02/23/2015   MCV 90.4 02/23/2015   PLT 124* 02/23/2015   NEUTROABS 1.0* 02/23/2015     RADIOGRAPHIC STUDIES: I have personally reviewed the radiology reports and agreed with their findings. No  results found.   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 Neo-adjuvant chemotherapy with Adriamycin and Cytoxan every 2 weeks 4 followed by Abraxane weekly 12 2. Followed by surgery which could be with mastectomy versus lumpectomy (patient may need biopsies to demonstrate extent of disease after neo-adjuvant chemotherapy) 3. Following surgery she may need adjuvant radiation if she undergoes lumpectomy  Current treatment: cycle 7/12 of Abraxane (patient could not receive cycle 4 Adriamycin and Cytoxan because of fixed drug eruption)  Chemotherapy toxicities: 1. Frequent urination: improved 2. Bone pain: Related to Neulasta. Improvement with Claritin. 3. Neutropenia ANC 1.4: Expected for  day 8 chemotherapy. No need of dose changes 4. Normocytic anemia: Hemoglobin 9.3 prior to starting chemotherapy she was anemic with a hemoglobin of 10.6 5. Hypokalemia: Potassium is 3 and I ordered oral potassium replacement 20 mEq daily 12/15/2014 and it improved to 3.4. Decreased potassium to once daily, with the decrease in potassium to 2.9, we will increase the dosage of potassium to 40 mEq daily 01/12/2015 6. Hot flashes for 24 hours after chemotherapy 7. Alopecia 8. Rash on the hands: itching related to chemotherapy with Adriamycin and Cytoxan. Fourth cycle of chemotherapy was not given for this reason. Patient is using Benadryl. We've previously treated her with Solu-Medrol. Marked improvement in the rash since last week. 9. Watering eyes and nose: patient takes Claritin. 10. Bronchitis 02/09/2015: Prescribed amoxicillin 11. Neuropathy grade 1: We'll continue to watch and monitor 12. Neutropenia:  Progressively getting worse with each treatment. [place] 1000 on 02/23/2015 we will further reduce the dosage of chemotherapy to 65 mg/m today.  Return to clinic in 1 weeks for week 8/12 Abraxane and for toxicity evaluation.    No orders of the defined types were placed in this encounter.   The patient has a good understanding of the overall plan. she agrees with it. she will call with any problems that may develop before the next visit here.   Rulon Eisenmenger, MD

## 2015-02-23 NOTE — Telephone Encounter (Signed)
Gave avs & calendar for September/October. °

## 2015-02-23 NOTE — Progress Notes (Signed)
OK to treat with ANC 1.0 per Dr. Lindi Adie.

## 2015-02-23 NOTE — Patient Instructions (Signed)
Neutropenia Neutropenia is a condition that occurs when the level of a certain type of white blood cell (neutrophil) in your body becomes lower than normal. Neutrophils are made in the bone marrow and fight infections. These cells protect against bacteria and viruses. The fewer neutrophils you have, and the longer your body remains without them, the greater your risk of getting a severe infection becomes. CAUSES  The cause of neutropenia may be hard to determine. However, it is usually due to 3 main problems:  1. Decreased production of neutrophils. This may be due to: 1. Certain medicines such as chemotherapy. 2. Genetic problems. 3. Cancer. 4. Radiation treatments. 5. Vitamin deficiency. 6. Some pesticides. 2. Increased destruction of neutrophils. This may be due to: 1. Overwhelming infections. 2. Hemolytic anemia. This is when the body destroys its own blood cells. 3. Chemotherapy. 3. Neutrophils moving to areas of the body where they cannot fight infections. This may be due to: 1. Dialysis procedures. 2. Conditions where the spleen becomes enlarged. Neutrophils are held in the spleen and are not available to the rest of the body. 3. Overwhelming infections. The neutrophils are held in the area of the infection and are not available to the rest of the body. SYMPTOMS  There are no specific symptoms of neutropenia. The lack of neutrophils can result in an infection, and an infection can cause various problems. DIAGNOSIS  Diagnosis is made by a blood test. A complete blood count is performed. The normal level of neutrophils in human blood differs with age and race. Infants have lower counts than older children and adults. African Americans have lower counts than Caucasians or Asians. The average adult level is 1500 cells/mm3 of blood. Neutrophil counts are interpreted as follows: 1. Greater than 1000 cells/mm3 gives normal protection against infection. 2. 500 to 1000 cells/mm3 gives an  increased risk for infection. 3. 200 to 500 cells/mm3 is a greater risk for severe infection. 4. Lower than 200 cells/mm3 is a marked risk of infection. This may require hospitalization and treatment with antibiotic medicines. TREATMENT  Treatment depends on the underlying cause, severity, and presence of infections or symptoms. It also depends on your health. Your caregiver will discuss the treatment plan with you. Mild cases are often easily treated and have a good outcome. Preventative measures may also be started to limit your risk of infections. Treatment can include:  Taking antibiotics.  Stopping medicines that are known to cause neutropenia.  Correcting nutritional deficiencies by eating green vegetables to supply folic acid and taking vitamin B supplements.  Stopping exposure to pesticides if your neutropenia is related to pesticide exposure.  Taking a blood growth factor called sargramostim, pegfilgrastim, or filgrastim if you are undergoing chemotherapy for cancer. This stimulates white blood cell production.  Removal of the spleen if you have Felty's syndrome and have repeated infections. HOME CARE INSTRUCTIONS   Follow your caregiver's instructions about when you need to have blood work done.  Wash your hands often. Make sure others who come in contact with you also wash their hands.  Wash raw fruits and vegetables before eating them. They can carry bacteria and fungi.  Avoid people with colds or spreadable (contagious) diseases (chickenpox, herpes zoster, influenza).  Avoid large crowds.  Avoid construction areas. The dust can release fungus into the air.  Be cautious around children in daycare or school environments.  Take care of your respiratory system by coughing and deep breathing.  Bathe daily.  Protect your skin from cuts and  burns.  Do not work in the garden or with flowers and plants.  Care for the mouth before and after meals by brushing with a soft  toothbrush. If you have mucositis, do not use mouthwash. Mouthwash contains alcohol and can dry out the mouth even more.  Clean the area between the genitals and the anus (perineal area) after urination and bowel movements. Women need to wipe from front to back.  Use a water soluble lubricant during sexual intercourse and practice good hygiene after. Do not have intercourse if you are severely neutropenic. Check with your caregiver for guidelines.  Exercise daily as tolerated.  Avoid people who were vaccinated with a live vaccine in the past 30 days. You should not receive live vaccines (polio, typhoid).  Do not provide direct care for pets. Avoid animal droppings. Do not clean litter boxes and bird cages.  Do not share food utensils.  Do not use tampons, enemas, or rectal suppositories unless directed by your caregiver.  Use an electric razor to remove hair.  Wash your hands after handling magazines, letters, and newspapers. SEEK IMMEDIATE MEDICAL CARE IF:   You have a fever.  You have chills or start to shake.  You feel nauseous or vomit.  You develop mouth sores.  You develop aches and pains.  You have redness and swelling around open wounds.  Your skin is warm to the touch.  You have pus coming from your wounds.  You develop swollen lymph nodes.  You feel weak or fatigued.  You develop red streaks on the skin. MAKE SURE YOU:  Understand these instructions.  Will watch your condition.  Will get help right away if you are not doing well or get worse. Document Released: 11/25/2001 Document Revised: 08/28/2011 Document Reviewed: 12/23/2010 [facility] Patient Information 2015 [place], [place]. This information is not intended to replace advice given to you by your health care provider. Make sure you discuss any questions you have with your health care provider.  [facility] Discharge Instructions for Patients Receiving Chemotherapy  Today you received  the following chemotherapy agents Abraxane.  To help prevent nausea and vomiting after your treatment, we encourage you to take your nausea medication as directed.   If you develop nausea and vomiting that is not controlled by your nausea medication, call the clinic.   BELOW ARE SYMPTOMS THAT SHOULD BE REPORTED IMMEDIATELY:  *FEVER GREATER THAN 100.5 F  *CHILLS WITH OR WITHOUT FEVER  NAUSEA AND VOMITING THAT IS NOT CONTROLLED WITH YOUR NAUSEA MEDICATION  *UNUSUAL SHORTNESS OF BREATH  *UNUSUAL BRUISING OR BLEEDING  TENDERNESS IN MOUTH AND THROAT WITH OR WITHOUT PRESENCE OF ULCERS  *URINARY PROBLEMS  *BOWEL PROBLEMS  UNUSUAL RASH Items with * indicate a potential emergency and should be followed up as soon as possible.  Feel free to call the clinic you have any questions or concerns. The clinic phone number is [phone].  Please show the [place] at check-in to the Emergency Department and triage nurse.

## 2015-02-23 NOTE — Assessment & Plan Note (Signed)
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 Neo-adjuvant chemotherapy with Adriamycin and Cytoxan every 2 weeks 4 followed by Abraxane weekly 12 2. Followed by surgery which could be with mastectomy versus lumpectomy (patient may need biopsies to demonstrate extent of disease after neo-adjuvant chemotherapy) 3. Following surgery she may need adjuvant radiation if she undergoes lumpectomy  Current treatment: cycle 7/12 of Abraxane (patient could not receive cycle 4 Adriamycin and Cytoxan because of fixed drug eruption)  Chemotherapy toxicities: 1. Frequent urination: improved 2. Bone pain: Related to Neulasta. Improvement with Claritin. 3. Neutropenia ANC 1.4: Expected for day 8 chemotherapy. No need of dose changes 4. Normocytic anemia: Hemoglobin 9.3 prior to starting chemotherapy she was anemic with a hemoglobin of 10.6 5. Hypokalemia: Potassium is 3 and I ordered oral potassium replacement 20 mEq daily 12/15/2014 and it improved to 3.4. Decreased potassium to once daily, with the decrease in potassium to 2.9, we will increase the dosage of potassium to 40 mEq daily 01/12/2015 6. Hot flashes for 24 hours after chemotherapy 7. Alopecia 8. Rash on the hands: itching related to chemotherapy with Adriamycin and Cytoxan. Fourth cycle of chemotherapy was not given for this reason. Patient is using Benadryl. We've previously treated her with Solu-Medrol. Marked improvement in the rash since last week. 9. Watering eyes and nose: patient takes Claritin. 10. Bronchitis 02/09/2015: Prescribed amoxicillin 11. Neuropathy grade 1: We'll continue to watch and monitor 12. Neutropenia: Progressively getting worse with each treatment. [place] 1000 on 02/23/2015 we will further reduce the dosage of  chemotherapy to 65 mg/m today.  Return to clinic in 1 weeks for week 8/12 Abraxane and for toxicity evaluation.

## 2015-03-02 ENCOUNTER — Other Ambulatory Visit (HOSPITAL_BASED_OUTPATIENT_CLINIC_OR_DEPARTMENT_OTHER): Payer: Managed Care, Other (non HMO)

## 2015-03-02 ENCOUNTER — Ambulatory Visit (HOSPITAL_BASED_OUTPATIENT_CLINIC_OR_DEPARTMENT_OTHER): Payer: Managed Care, Other (non HMO)

## 2015-03-02 ENCOUNTER — Ambulatory Visit: Payer: Managed Care, Other (non HMO) | Admitting: Hematology and Oncology

## 2015-03-02 ENCOUNTER — Ambulatory Visit: Payer: Managed Care, Other (non HMO)

## 2015-03-02 ENCOUNTER — Ambulatory Visit (HOSPITAL_BASED_OUTPATIENT_CLINIC_OR_DEPARTMENT_OTHER): Payer: Managed Care, Other (non HMO) | Admitting: Hematology and Oncology

## 2015-03-02 ENCOUNTER — Other Ambulatory Visit: Payer: Managed Care, Other (non HMO)

## 2015-03-02 ENCOUNTER — Encounter: Payer: Self-pay | Admitting: Hematology and Oncology

## 2015-03-02 VITALS — BP 118/63 | HR 87 | Temp 98.2°F | Resp 18 | Ht 66.0 in | Wt 112.1 lb

## 2015-03-02 DIAGNOSIS — C50812 Malignant neoplasm of overlapping sites of left female breast: Secondary | ICD-10-CM

## 2015-03-02 DIAGNOSIS — C50412 Malignant neoplasm of upper-outer quadrant of left female breast: Secondary | ICD-10-CM | POA: Diagnosis not present

## 2015-03-02 DIAGNOSIS — Z5111 Encounter for antineoplastic chemotherapy: Secondary | ICD-10-CM

## 2015-03-02 LAB — CBC WITH DIFFERENTIAL/PLATELET
BASO%: 1.8 % (ref 0.0–2.0)
Basophils Absolute: 0 10*3/uL (ref 0.0–0.1)
EOS%: 4.8 % (ref 0.0–7.0)
Eosinophils Absolute: 0.1 10*3/uL (ref 0.0–0.5)
HCT: 26.7 % — ABNORMAL LOW (ref 34.8–46.6)
HGB: 9.1 g/dL — ABNORMAL LOW (ref 11.6–15.9)
LYMPH%: 17.6 % (ref 14.0–49.7)
MCH: 31 pg (ref 25.1–34.0)
MCHC: 34.1 g/dL (ref 31.5–36.0)
MCV: 90.7 fL (ref 79.5–101.0)
MONO#: 0.3 10*3/uL (ref 0.1–0.9)
MONO%: 11.2 % (ref 0.0–14.0)
NEUT#: 1.7 10*3/uL (ref 1.5–6.5)
NEUT%: 64.6 % (ref 38.4–76.8)
Platelets: 191 10*3/uL (ref 145–400)
RBC: 2.94 10*6/uL — ABNORMAL LOW (ref 3.70–5.45)
RDW: 21.2 % — ABNORMAL HIGH (ref 11.2–14.5)
WBC: 2.6 10*3/uL — ABNORMAL LOW (ref 3.9–10.3)
lymph#: 0.4 10*3/uL — ABNORMAL LOW (ref 0.9–3.3)

## 2015-03-02 LAB — COMPREHENSIVE METABOLIC PANEL (CC13)
ALT: 23 U/L (ref 0–55)
AST: 33 U/L (ref 5–34)
Albumin: 3.8 g/dL (ref 3.5–5.0)
Alkaline Phosphatase: 73 U/L (ref 40–150)
Anion Gap: 8 mEq/L (ref 3–11)
BUN: 7.1 mg/dL (ref 7.0–26.0)
CO2: 30 mEq/L — ABNORMAL HIGH (ref 22–29)
Calcium: 9.3 mg/dL (ref 8.4–10.4)
Chloride: 96 mEq/L — ABNORMAL LOW (ref 98–109)
Creatinine: 0.6 mg/dL (ref 0.6–1.1)
EGFR: >90 mL/min/{1.73_m2} (ref >90)
Glucose: 85 mg/dl (ref 70–140)
Potassium: 3.1 mEq/L — ABNORMAL LOW (ref 3.5–5.1)
Sodium: 134 mEq/L — ABNORMAL LOW (ref 136–145)
Total Bilirubin: 0.35 mg/dL (ref 0.20–1.20)
Total Protein: 6.5 g/dL (ref 6.4–8.3)

## 2015-03-02 MED ORDER — PALONOSETRON HCL INJECTION 0.25 MG/5ML
INTRAVENOUS | Status: AC
  Filled 2015-03-02: qty 5

## 2015-03-02 MED ORDER — HEPARIN SOD (PORK) LOCK FLUSH 100 UNIT/ML IV SOLN
500.0000 [IU] | Freq: Once | INTRAVENOUS | Status: AC | PRN
  Administered 2015-03-02: 500 [IU]
  Filled 2015-03-02: qty 5

## 2015-03-02 MED ORDER — PACLITAXEL PROTEIN-BOUND CHEMO INJECTION 100 MG
65.0000 mg/m2 | Freq: Once | INTRAVENOUS | Status: AC
  Administered 2015-03-02: 100 mg via INTRAVENOUS
  Filled 2015-03-02: qty 20

## 2015-03-02 MED ORDER — PALONOSETRON HCL INJECTION 0.25 MG/5ML
0.2500 mg | Freq: Once | INTRAVENOUS | Status: AC
  Administered 2015-03-02: 0.25 mg via INTRAVENOUS

## 2015-03-02 MED ORDER — SODIUM CHLORIDE 0.9 % IV SOLN
Freq: Once | INTRAVENOUS | Status: AC
  Administered 2015-03-02: 16:00:00 via INTRAVENOUS

## 2015-03-02 MED ORDER — SODIUM CHLORIDE 0.9 % IJ SOLN
10.0000 mL | INTRAMUSCULAR | Status: DC | PRN
  Administered 2015-03-02: 10 mL
  Filled 2015-03-02: qty 10

## 2015-03-02 NOTE — Assessment & Plan Note (Signed)
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 Neo-adjuvant chemotherapy with Adriamycin and Cytoxan every 2 weeks 4 followed by Abraxane weekly 12 2. Followed by surgery which could be with mastectomy versus lumpectomy (patient may need biopsies to demonstrate extent of disease after neo-adjuvant chemotherapy) 3. Following surgery she may need adjuvant radiation if she undergoes lumpectomy  Current treatment: cycle 8/12 of Abraxane (patient could not receive cycle 4 Adriamycin and Cytoxan because of fixed drug eruption)  Chemotherapy toxicities: 1. Frequent urination: improved 2. Bone pain: Related to Neulasta. Improvement with Claritin. 3. Neutropenia ANC 1.4: Expected for day 8 chemotherapy. No need of dose changes 4. Normocytic anemia: Hemoglobin 9.3 prior to starting chemotherapy she was anemic with a hemoglobin of 10.6 5. Hypokalemia: Potassium is 3 and I ordered oral potassium replacement 20 mEq daily 12/15/2014 and it improved to 3.4. Decreased potassium to once daily, with the decrease in potassium to 2.9, we will increase the dosage of potassium to 40 mEq daily 01/12/2015 6. Hot flashes for 24 hours after chemotherapy 7. Alopecia 8. Rash on the hands: itching related to chemotherapy with Adriamycin and Cytoxan. Fourth cycle of chemotherapy was not given for this reason. Patient is using Benadryl. We've previously treated her with Solu-Medrol. Marked improvement in the rash since last week. 9. Watering eyes and nose: patient takes Claritin. 10. Bronchitis 02/09/2015: Prescribed amoxicillin 11. Neuropathy grade 1: We'll continue to watch and monitor 12. Neutropenia: Progressively getting worse with each treatment. ANC 1000 on 02/23/2015 we reduced the dosage of chemotherapy to  65 mg/m with week 7 and this improved the ANC with week 8  Return to clinic in 1 weeks for week 9/12 Abraxane and for toxicity evaluation.

## 2015-03-02 NOTE — Patient Instructions (Signed)
[facility] Discharge Instructions for Patients Receiving Chemotherapy  Today you received the following chemotherapy agents:  Abraxane  To help prevent nausea and vomiting after your treatment, we encourage you to take your nausea medication as ordered per MD.   If you develop nausea and vomiting that is not controlled by your nausea medication, call the clinic.   BELOW ARE SYMPTOMS THAT SHOULD BE REPORTED IMMEDIATELY:  *FEVER GREATER THAN 100.5 F  *CHILLS WITH OR WITHOUT FEVER  NAUSEA AND VOMITING THAT IS NOT CONTROLLED WITH YOUR NAUSEA MEDICATION  *UNUSUAL SHORTNESS OF BREATH  *UNUSUAL BRUISING OR BLEEDING  TENDERNESS IN MOUTH AND THROAT WITH OR WITHOUT PRESENCE OF ULCERS  *URINARY PROBLEMS  *BOWEL PROBLEMS  UNUSUAL RASH Items with * indicate a potential emergency and should be followed up as soon as possible.  Feel free to call the clinic you have any questions or concerns. The clinic phone number is [phone].  Please show the CHEMO ALERT CARD at check-in to the Emergency Department and triage nurse.   

## 2015-03-02 NOTE — Progress Notes (Signed)
Patient Care Team: Chipper Herb, MD as PCP - General (Family Medicine) Dr Geanie Berlin (Gynecology) Holley Bouche, NP as Nurse Practitioner (Nurse Practitioner)  DIAGNOSIS: Breast cancer of upper-outer quadrant of left female breast   Staging form: Breast, AJCC 7th Edition     Clinical stage from 11/18/2014: Stage IIB (T3, N0, M0) - Unsigned   SUMMARY OF ONCOLOGIC HISTORY:   Breast cancer of upper-outer quadrant of left female breast   11/09/2014 Mammogram Left breast irregular hypoechoic mass 12:00 position 5.3 x 3.7 x 2.9 cm, left low axilla normal sized lymph nodes   11/09/2014 Initial Diagnosis Left Bx: IDC with DCIS; Grade 3, Er 0%, PR 0%, Her 2 Neg, Ki 67: 70%   11/30/2014 -  Neo-Adjuvant Chemotherapy Dose dense Adriamycin and Cytoxan 4 followed by Abraxane weekly 12    CHIEF COMPLIANT: Week 8 Abraxane  INTERVAL HISTORY: Sherry Bender is a 64 year old with above-mentioned history left breast cancer currently on neo-adjuvant chemotherapy and today is week 8 of Abraxane. She had significant neutropenia with an ANC of 1000 with week 7. We had to reduce the dosage of chemotherapy and she is here today to recheck her lab work. She denies any neuropathy. Denies any nausea vomiting. She does feel moderately fatigued. Denies any fevers or chills.  REVIEW OF SYSTEMS:   Constitutional: Denies fevers, chills or abnormal weight loss Eyes: Denies blurriness of vision Ears, nose, mouth, throat, and face: Denies mucositis or sore throat Respiratory: Denies cough, dyspnea or wheezes Cardiovascular: Denies palpitation, chest discomfort or lower extremity swelling Gastrointestinal:  Denies nausea, heartburn or change in bowel habits Skin: Denies abnormal skin rashes Lymphatics: Denies new lymphadenopathy or easy bruising Neurological:Denies numbness, tingling or new weaknesses Behavioral/Psych: Mood is stable, no new changes  All other systems were reviewed with the patient and are negative.  I  have reviewed the past medical history, past surgical history, social history and family history with the patient and they are unchanged from previous note.  ALLERGIES:  is allergic to codeine.  MEDICATIONS:  Current Outpatient Prescriptions  Medication Sig Dispense Refill  . dexamethasone (DECADRON) 4 MG tablet TAKE 1 TABLET ON THE DAY AFTER CHEMOTHERAPY AND THEN 1 TABLET TWICE A DAY FOR 2 DAYS WITH FOOD  1  . dexlansoprazole (DEXILANT) 60 MG capsule Take 1 capsule (60 mg total) by mouth daily. 30 capsule 4  . diazepam (VALIUM) 5 MG tablet Take 1 tablet (5 mg total) by mouth every 6 (six) hours as needed for anxiety. 30 tablet 0  . estradiol (ESTRACE) 1 MG tablet Take 1 mg by mouth daily.      . hydrochlorothiazide (HYDRODIURIL) 25 MG tablet Take 1 tablet (25 mg total) by mouth daily. 90 tablet 0  . HYDROcodone-acetaminophen (NORCO/VICODIN) 5-325 MG per tablet     . LORazepam (ATIVAN) 0.5 MG tablet Take 0.5 mg by mouth at bedtime.  0  . Multiple Vitamin (MULTIVITAMIN) tablet Take 1 tablet by mouth daily.    . ondansetron (ZOFRAN) 8 MG tablet TAKE 1 TABLET BY MOUTH TWICE A DAY AS NEEDED,START ON THE 3RD DAY AFTER CHEMOTHERAPY  1  . Phenazopyridine HCl (AZO TABS PO) Take 1 tablet by mouth.    . potassium chloride (MICRO-K) 10 MEQ CR capsule Take 1 capsule (10 mEq total) by mouth daily. 30 capsule 1  . prochlorperazine (COMPAZINE) 10 MG tablet TAKE 1 TABLET (10 MG TOTAL) BY MOUTH EVERY 6 (SIX) HOURS AS NEEDED (NAUSEA OR VOMITING).  1   No current facility-administered  medications for this visit.   Facility-Administered Medications Ordered in Other Visits  Medication Dose Route Frequency Provider Last Rate Last Dose  . heparin lock flush 100 unit/mL  500 Units Intracatheter Once PRN Nicholas Lose, MD      . PACLitaxel-protein bound (ABRAXANE) chemo infusion 100 mg  65 mg/m2 (Treatment Plan Actual) Intravenous Once Nicholas Lose, MD      . sodium chloride 0.9 % injection 10 mL  10 mL  Intracatheter PRN Nicholas Lose, MD        PHYSICAL EXAMINATION: ECOG PERFORMANCE STATUS: 1 - Symptomatic but completely ambulatory  Filed Vitals:   03/02/15 1428  BP: 118/63  Pulse: 87  Temp: 98.2 F (36.8 C)  Resp: 18   Filed Weights   03/02/15 1428  Weight: 112 lb 1.6 oz (50.848 kg)    GENERAL:alert, no distress and comfortable SKIN: skin color, texture, turgor are normal, no rashes or significant lesions EYES: normal, Conjunctiva are pink and non-injected, sclera clear OROPHARYNX:no exudate, no erythema and lips, buccal mucosa, and tongue normal  NECK: supple, thyroid normal size, non-tender, without nodularity LYMPH:  no palpable lymphadenopathy in the cervical, axillary or inguinal LUNGS: clear to auscultation and percussion with normal breathing effort HEART: regular rate & rhythm and no murmurs and no lower extremity edema ABDOMEN:abdomen soft, non-tender and normal bowel sounds Musculoskeletal:no cyanosis of digits and no clubbing  NEURO: alert & oriented x 3 with fluent speech, no focal motor/sensory deficits   LABORATORY DATA:  I have reviewed the data as listed   Chemistry      Component Value Date/Time   NA 134* 03/02/2015 1413   NA 133* 06/20/2014 2042   K 3.1* 03/02/2015 1413   K 3.4* 06/20/2014 2042   CL 97 06/20/2014 2042   CO2 30* 03/02/2015 1413   CO2 22 06/20/2014 2042   BUN 7.1 03/02/2015 1413   BUN 8 06/20/2014 2042   CREATININE 0.6 03/02/2015 1413   CREATININE 0.37* 06/20/2014 2042      Component Value Date/Time   CALCIUM 9.3 03/02/2015 1413   CALCIUM 9.1 06/20/2014 2042   ALKPHOS 73 03/02/2015 1413   AST 33 03/02/2015 1413   ALT 23 03/02/2015 1413   BILITOT 0.35 03/02/2015 1413       Lab Results  Component Value Date   WBC 2.6* 03/02/2015   HGB 9.1* 03/02/2015   HCT 26.7* 03/02/2015   MCV 90.7 03/02/2015   PLT 191 03/02/2015   NEUTROABS 1.7 03/02/2015   ASSESSMENT & PLAN:  Breast cancer of upper-outer quadrant of left female  breast 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 Neo-adjuvant chemotherapy with Adriamycin and Cytoxan every 2 weeks 4 followed by Abraxane weekly 12 2. Followed by surgery which could be with mastectomy versus lumpectomy (patient may need biopsies to demonstrate extent of disease after neo-adjuvant chemotherapy) 3. Following surgery she may need adjuvant radiation if she undergoes lumpectomy  Current treatment: cycle 8/12 of Abraxane (patient could not receive cycle 4 Adriamycin and Cytoxan because of fixed drug eruption)  Chemotherapy toxicities: 1. Frequent urination: improved 2. Bone pain: Related to Neulasta. Improvement with Claritin. 3. Neutropenia 4. Normocytic anemia: Hemoglobin 9.1 stable (prior to starting chemotherapy she was anemic with a hemoglobin of 10.6) 5. Hypokalemia: Potassium  is 3.1 on oral potassium 6. Hot flashes for 24 hours after chemotherapy 7. Alopecia 8. Rash on the hands: itching related to chemotherapy with Adriamycin and Cytoxan. Fourth cycle of chemotherapy was not given for this reason.  9. Watering eyes and nose: patient takes Claritin. 10. Bronchitis 02/09/2015: Prescribed amoxicillin 11. Neuropathy grade 1: We'll continue to watch and monitor 12. Neutropenia: Progressively getting worse with each treatment. ANC 1000 on 02/23/2015 we reduced the dosage of chemotherapy to 65 mg/m with week 7 and this improved the ANC with week 8  Return to clinic in 1 weeks for week 9/12 Abraxane and for toxicity evaluation.  No orders of the defined types were placed in this encounter.   The patient has a good understanding of the overall plan. she agrees with it. she will call with any problems that may develop before the next visit here.   Rulon Eisenmenger, MD

## 2015-03-09 ENCOUNTER — Other Ambulatory Visit (HOSPITAL_BASED_OUTPATIENT_CLINIC_OR_DEPARTMENT_OTHER): Payer: Managed Care, Other (non HMO)

## 2015-03-09 ENCOUNTER — Ambulatory Visit: Payer: Managed Care, Other (non HMO)

## 2015-03-09 ENCOUNTER — Ambulatory Visit (HOSPITAL_BASED_OUTPATIENT_CLINIC_OR_DEPARTMENT_OTHER): Payer: Managed Care, Other (non HMO)

## 2015-03-09 ENCOUNTER — Other Ambulatory Visit: Payer: Self-pay | Admitting: Hematology and Oncology

## 2015-03-09 ENCOUNTER — Telehealth: Payer: Self-pay | Admitting: Hematology and Oncology

## 2015-03-09 ENCOUNTER — Ambulatory Visit (HOSPITAL_BASED_OUTPATIENT_CLINIC_OR_DEPARTMENT_OTHER): Payer: Managed Care, Other (non HMO) | Admitting: Hematology and Oncology

## 2015-03-09 ENCOUNTER — Encounter: Payer: Self-pay | Admitting: Hematology and Oncology

## 2015-03-09 VITALS — BP 149/57 | HR 83 | Temp 97.8°F | Resp 18 | Ht 66.0 in | Wt 111.3 lb

## 2015-03-09 DIAGNOSIS — C50812 Malignant neoplasm of overlapping sites of left female breast: Secondary | ICD-10-CM

## 2015-03-09 DIAGNOSIS — R21 Rash and other nonspecific skin eruption: Secondary | ICD-10-CM | POA: Diagnosis not present

## 2015-03-09 DIAGNOSIS — Z5111 Encounter for antineoplastic chemotherapy: Secondary | ICD-10-CM | POA: Diagnosis not present

## 2015-03-09 DIAGNOSIS — C50412 Malignant neoplasm of upper-outer quadrant of left female breast: Secondary | ICD-10-CM

## 2015-03-09 DIAGNOSIS — D701 Agranulocytosis secondary to cancer chemotherapy: Secondary | ICD-10-CM

## 2015-03-09 LAB — COMPREHENSIVE METABOLIC PANEL (CC13)
ALT: 25 U/L (ref 0–55)
AST: 34 U/L (ref 5–34)
Albumin: 3.8 g/dL (ref 3.5–5.0)
Alkaline Phosphatase: 63 U/L (ref 40–150)
Anion Gap: 8 mEq/L (ref 3–11)
BUN: 8.6 mg/dL (ref 7.0–26.0)
CO2: 30 mEq/L — ABNORMAL HIGH (ref 22–29)
Calcium: 9.3 mg/dL (ref 8.4–10.4)
Chloride: 97 mEq/L — ABNORMAL LOW (ref 98–109)
Creatinine: 0.6 mg/dL (ref 0.6–1.1)
EGFR: >90 mL/min/{1.73_m2} (ref >90)
Glucose: 100 mg/dl (ref 70–140)
Potassium: 3.6 mEq/L (ref 3.5–5.1)
Sodium: 135 mEq/L — ABNORMAL LOW (ref 136–145)
Total Bilirubin: 0.26 mg/dL (ref 0.20–1.20)
Total Protein: 6.4 g/dL (ref 6.4–8.3)

## 2015-03-09 LAB — CBC WITH DIFFERENTIAL/PLATELET
BASO%: 1.9 % (ref 0.0–2.0)
Basophils Absolute: 0 10*3/uL (ref 0.0–0.1)
EOS%: 5.9 % (ref 0.0–7.0)
Eosinophils Absolute: 0.1 10*3/uL (ref 0.0–0.5)
HCT: 27.8 % — ABNORMAL LOW (ref 34.8–46.6)
HGB: 9.3 g/dL — ABNORMAL LOW (ref 11.6–15.9)
LYMPH%: 16.4 % (ref 14.0–49.7)
MCH: 30.8 pg (ref 25.1–34.0)
MCHC: 33.5 g/dL (ref 31.5–36.0)
MCV: 92.1 fL (ref 79.5–101.0)
MONO#: 0.3 10*3/uL (ref 0.1–0.9)
MONO%: 11.1 % (ref 0.0–14.0)
NEUT#: 1.6 10*3/uL (ref 1.5–6.5)
NEUT%: 64.7 % (ref 38.4–76.8)
Platelets: 172 10*3/uL (ref 145–400)
RBC: 3.01 10*6/uL — ABNORMAL LOW (ref 3.70–5.45)
RDW: 19.4 % — ABNORMAL HIGH (ref 11.2–14.5)
WBC: 2.4 10*3/uL — ABNORMAL LOW (ref 3.9–10.3)
lymph#: 0.4 10*3/uL — ABNORMAL LOW (ref 0.9–3.3)

## 2015-03-09 MED ORDER — PACLITAXEL PROTEIN-BOUND CHEMO INJECTION 100 MG
65.0000 mg/m2 | Freq: Once | INTRAVENOUS | Status: AC
  Administered 2015-03-09: 100 mg via INTRAVENOUS
  Filled 2015-03-09: qty 20

## 2015-03-09 MED ORDER — SODIUM CHLORIDE 0.9 % IJ SOLN
10.0000 mL | INTRAMUSCULAR | Status: DC | PRN
  Administered 2015-03-09: 10 mL
  Filled 2015-03-09: qty 10

## 2015-03-09 MED ORDER — PALONOSETRON HCL INJECTION 0.25 MG/5ML
0.2500 mg | Freq: Once | INTRAVENOUS | Status: AC
  Administered 2015-03-09: 0.25 mg via INTRAVENOUS

## 2015-03-09 MED ORDER — SODIUM CHLORIDE 0.9 % IV SOLN
Freq: Once | INTRAVENOUS | Status: AC
  Administered 2015-03-09: 15:00:00 via INTRAVENOUS

## 2015-03-09 MED ORDER — PALONOSETRON HCL INJECTION 0.25 MG/5ML
INTRAVENOUS | Status: AC
  Filled 2015-03-09: qty 5

## 2015-03-09 MED ORDER — HEPARIN SOD (PORK) LOCK FLUSH 100 UNIT/ML IV SOLN
500.0000 [IU] | Freq: Once | INTRAVENOUS | Status: AC | PRN
  Administered 2015-03-09: 500 [IU]
  Filled 2015-03-09: qty 5

## 2015-03-09 NOTE — Assessment & Plan Note (Signed)
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 Neo-adjuvant chemotherapy with Adriamycin and Cytoxan every 2 weeks 4 followed by Abraxane weekly 12 2. Followed by surgery which could be with mastectomy versus lumpectomy (patient may need biopsies to demonstrate extent of disease after neo-adjuvant chemotherapy) 3. Following surgery she may need adjuvant radiation if she undergoes lumpectomy  Current treatment: cycle 9/12 of Abraxane (patient could not receive cycle 4 Adriamycin and Cytoxan because of fixed drug eruption)  Chemotherapy toxicities: 1. Frequent urination: improved 2. Bone pain: Related to Neulasta. Improvement with Claritin. 3. Neutropenia 4. Normocytic anemia: Hemoglobin 9.1 stable (prior to starting chemotherapy she was anemic with a hemoglobin of 10.6) 5. Hypokalemia: Potassium is 3.1 on oral potassium 6. Hot flashes for 24 hours after chemotherapy 7. Alopecia 8. Rash on the hands: itching related to chemotherapy with Adriamycin and Cytoxan. Fourth cycle of chemotherapy was not given for this reason.  9. Watering eyes and nose: patient takes Claritin. 10. Bronchitis 02/09/2015: Prescribed amoxicillin 11. Neuropathy grade 1: We'll continue to watch and monitor 12. Neutropenia: Progressively getting worse with each treatment. ANC 1000 on 02/23/2015 we reduced the dosage of chemotherapy to 65 mg/m with week 7 and this improved the ANC with week 8  Return to clinic in 1 week for week 10/12 Abraxane and for toxicity evaluation.

## 2015-03-09 NOTE — Telephone Encounter (Signed)
Patient has an appointment already on schedule with dr Lindi Adie and patient will get a call from [place] [facility] for [facility]

## 2015-03-09 NOTE — Progress Notes (Signed)
Patient Care Team: Chipper Herb, MD as PCP - General (Family Medicine) Dr Geanie Berlin (Gynecology) Holley Bouche, NP as Nurse Practitioner (Nurse Practitioner)  DIAGNOSIS: Breast cancer of upper-outer quadrant of left female breast   Staging form: Breast, AJCC 7th Edition     Clinical stage from 11/18/2014: Stage IIB (T3, N0, M0) - Unsigned   SUMMARY OF ONCOLOGIC HISTORY:   Breast cancer of upper-outer quadrant of left female breast   11/09/2014 Mammogram Left breast irregular hypoechoic mass 12:00 position 5.3 x 3.7 x 2.9 cm, left low axilla normal sized lymph nodes   11/09/2014 Initial Diagnosis Left Bx: IDC with DCIS; Grade 3, Er 0%, PR 0%, Her 2 Neg, Ki 67: 70%   11/30/2014 -  Neo-Adjuvant Chemotherapy Dose dense Adriamycin and Cytoxan 4 followed by Abraxane weekly 12    CHIEF COMPLIANT: Week 9 Abraxane  INTERVAL HISTORY: Sherry Bender is a 64 year old with above-mentioned history of left breast cancer currently on neo-adjuvant chemotherapy. After the last cycle of Abraxane she felt markedly fatigued for 2-3 days. She is now starting to feel better. She does not have any nausea or vomiting. Does not have neuropathy. She does feel achiness in her extremities.  REVIEW OF SYSTEMS:   Constitutional: Denies fevers, chills or abnormal weight loss Eyes: Denies blurriness of vision Ears, nose, mouth, throat, and face: Denies mucositis or sore throat Respiratory: Denies cough, dyspnea or wheezes Cardiovascular: Denies palpitation, chest discomfort or lower extremity swelling Gastrointestinal:  Denies nausea, heartburn or change in bowel habits Skin: Denies abnormal skin rashes Lymphatics: Denies new lymphadenopathy or easy bruising Neurological:Denies numbness, tingling or new weaknesses Behavioral/Psych: Mood is stable, no new changes  All other systems were reviewed with the patient and are negative.  I have reviewed the past medical history, past surgical history, social history and  family history with the patient and they are unchanged from previous note.  ALLERGIES:  is allergic to codeine.  MEDICATIONS:  Current Outpatient Prescriptions  Medication Sig Dispense Refill  . dexamethasone (DECADRON) 4 MG tablet TAKE 1 TABLET ON THE DAY AFTER CHEMOTHERAPY AND THEN 1 TABLET TWICE A DAY FOR 2 DAYS WITH FOOD  1  . dexlansoprazole (DEXILANT) 60 MG capsule Take 1 capsule (60 mg total) by mouth daily. 30 capsule 4  . diazepam (VALIUM) 5 MG tablet Take 1 tablet (5 mg total) by mouth every 6 (six) hours as needed for anxiety. 30 tablet 0  . estradiol (ESTRACE) 1 MG tablet Take 1 mg by mouth daily.      . hydrochlorothiazide (HYDRODIURIL) 25 MG tablet Take 1 tablet (25 mg total) by mouth daily. 90 tablet 0  . HYDROcodone-acetaminophen (NORCO/VICODIN) 5-325 MG per tablet     . LORazepam (ATIVAN) 0.5 MG tablet Take 0.5 mg by mouth at bedtime.  0  . Multiple Vitamin (MULTIVITAMIN) tablet Take 1 tablet by mouth daily.    . ondansetron (ZOFRAN) 8 MG tablet TAKE 1 TABLET BY MOUTH TWICE A DAY AS NEEDED,START ON THE 3RD DAY AFTER CHEMOTHERAPY  1  . Phenazopyridine HCl (AZO TABS PO) Take 1 tablet by mouth.    . potassium chloride (MICRO-K) 10 MEQ CR capsule Take 1 capsule (10 mEq total) by mouth daily. 30 capsule 1  . prochlorperazine (COMPAZINE) 10 MG tablet TAKE 1 TABLET (10 MG TOTAL) BY MOUTH EVERY 6 (SIX) HOURS AS NEEDED (NAUSEA OR VOMITING).  1   No current facility-administered medications for this visit.    PHYSICAL EXAMINATION: ECOG PERFORMANCE STATUS: 1 - Symptomatic  but completely ambulatory  Filed Vitals:   03/09/15 1300  BP: 149/57  Pulse: 83  Temp: 97.8 F (36.6 C)  Resp: 18   Filed Weights   03/09/15 1300  Weight: 111 lb 4.8 oz (50.485 kg)    GENERAL:alert, no distress and comfortable SKIN: skin color, texture, turgor are normal, no rashes or significant lesions EYES: normal, Conjunctiva are pink and non-injected, sclera clear OROPHARYNX:no exudate, no  erythema and lips, buccal mucosa, and tongue normal  NECK: supple, thyroid normal size, non-tender, without nodularity LYMPH:  no palpable lymphadenopathy in the cervical, axillary or inguinal LUNGS: clear to auscultation and percussion with normal breathing effort HEART: regular rate & rhythm and no murmurs and no lower extremity edema ABDOMEN:abdomen soft, non-tender and normal bowel sounds Musculoskeletal:no cyanosis of digits and no clubbing  NEURO: alert & oriented x 3 with fluent speech, no focal motor/sensory deficits  LABORATORY DATA:  I have reviewed the data as listed   Chemistry      Component Value Date/Time   NA 134* 03/02/2015 1413   NA 133* 06/20/2014 2042   K 3.1* 03/02/2015 1413   K 3.4* 06/20/2014 2042   CL 97 06/20/2014 2042   CO2 30* 03/02/2015 1413   CO2 22 06/20/2014 2042   BUN 7.1 03/02/2015 1413   BUN 8 06/20/2014 2042   CREATININE 0.6 03/02/2015 1413   CREATININE 0.37* 06/20/2014 2042      Component Value Date/Time   CALCIUM 9.3 03/02/2015 1413   CALCIUM 9.1 06/20/2014 2042   ALKPHOS 73 03/02/2015 1413   AST 33 03/02/2015 1413   ALT 23 03/02/2015 1413   BILITOT 0.35 03/02/2015 1413       Lab Results  Component Value Date   WBC 2.4* 03/09/2015   HGB 9.3* 03/09/2015   HCT 27.8* 03/09/2015   MCV 92.1 03/09/2015   PLT 172 03/09/2015   NEUTROABS 1.6 03/09/2015   ASSESSMENT & PLAN:  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 Neo-adjuvant chemotherapy with Adriamycin and Cytoxan every 2 weeks 4 followed by Abraxane weekly 12 2. Followed by surgery which could be with mastectomy versus lumpectomy (patient may need biopsies to demonstrate extent of disease after neo-adjuvant  chemotherapy) 3. Following surgery she may need adjuvant radiation if she undergoes lumpectomy  Current treatment: cycle 9/12 of Abraxane (patient could not receive cycle 4 Adriamycin and Cytoxan because of fixed drug eruption)  Chemotherapy toxicities: 1. Frequent urination: improved 2. Bone pain: Related to Neulasta. Improvement with Claritin. 3. Neutropenia 4. Normocytic anemia: Hemoglobin 9.1 stable (prior to starting chemotherapy she was anemic with a hemoglobin of 10.6) 5. Hypokalemia: Potassium is 3.1 on oral potassium 6. Hot flashes for 24 hours after chemotherapy 7. Alopecia 8. Rash on the hands: itching related to chemotherapy with Adriamycin and Cytoxan. Fourth cycle of chemotherapy was not given for this reason.  9. Watering eyes and nose: patient takes Claritin. 10. Bronchitis 02/09/2015: Prescribed amoxicillin 11. Neuropathy grade 1: We'll continue to watch and monitor 12. Neutropenia: Progressively getting worse with each treatment. ANC 1000 on 02/23/2015 we reduced the dosage of chemotherapy to 65 mg/m with week 7 and this improved the ANC with week 8  Return to clinic in  2 weeks for week 11/12 Abraxane and for toxicity evaluation. I set her up for breast MRI for 04/05/2015 and clinic follow-up 04/07/2015   Orders Placed This Encounter  Procedures  . MR Breast Bilateral W Wo Contrast    Standing Status: Future     Number of Occurrences:      Standing Expiration Date: 05/08/2016    Order Specific Question:  Reason for Exam (SYMPTOM  OR DIAGNOSIS REQUIRED)    Answer:  Post neoadjuvant chemo MRI Breast    Order Specific Question:  Preferred imaging location?    Answer:  [id number]    Order Specific Question:  Does the patient have a pacemaker or implanted devices?    Answer:  No    Order Specific Question:  What is the patient's sedation requirement?    Answer:  No Sedation   The patient has a good understanding of the overall plan. she agrees with it.  she will call with any problems that may develop before the next visit here.   Rulon Eisenmenger, MD

## 2015-03-11 ENCOUNTER — Encounter: Payer: Self-pay | Admitting: *Deleted

## 2015-03-15 ENCOUNTER — Telehealth: Payer: Self-pay | Admitting: *Deleted

## 2015-03-15 NOTE — Telephone Encounter (Signed)
Received telephone advice record, sent to scan. Called patient and she reported that cough and sore throat are better, did salt water gargles, cough drops and cough medicine. Advised to call if symptoms worsen, she verbalized understanding.

## 2015-03-16 ENCOUNTER — Ambulatory Visit (HOSPITAL_BASED_OUTPATIENT_CLINIC_OR_DEPARTMENT_OTHER): Payer: Managed Care, Other (non HMO)

## 2015-03-16 ENCOUNTER — Ambulatory Visit: Payer: Managed Care, Other (non HMO) | Admitting: Hematology and Oncology

## 2015-03-16 ENCOUNTER — Ambulatory Visit: Payer: Managed Care, Other (non HMO)

## 2015-03-16 ENCOUNTER — Other Ambulatory Visit (HOSPITAL_BASED_OUTPATIENT_CLINIC_OR_DEPARTMENT_OTHER): Payer: Managed Care, Other (non HMO)

## 2015-03-16 VITALS — BP 142/92 | HR 80 | Temp 97.8°F | Resp 18

## 2015-03-16 DIAGNOSIS — Z5111 Encounter for antineoplastic chemotherapy: Secondary | ICD-10-CM | POA: Diagnosis not present

## 2015-03-16 DIAGNOSIS — C50812 Malignant neoplasm of overlapping sites of left female breast: Secondary | ICD-10-CM

## 2015-03-16 DIAGNOSIS — C50412 Malignant neoplasm of upper-outer quadrant of left female breast: Secondary | ICD-10-CM

## 2015-03-16 LAB — COMPREHENSIVE METABOLIC PANEL (CC13)
ALT: 28 U/L (ref 0–55)
AST: 36 U/L — ABNORMAL HIGH (ref 5–34)
Albumin: 3.9 g/dL (ref 3.5–5.0)
Alkaline Phosphatase: 82 U/L (ref 40–150)
Anion Gap: 10 mEq/L (ref 3–11)
BUN: 7.7 mg/dL (ref 7.0–26.0)
CO2: 27 mEq/L (ref 22–29)
Calcium: 9.2 mg/dL (ref 8.4–10.4)
Chloride: 98 mEq/L (ref 98–109)
Creatinine: 0.5 mg/dL — ABNORMAL LOW (ref 0.6–1.1)
EGFR: >90 mL/min/{1.73_m2} (ref >90)
Glucose: 76 mg/dl (ref 70–140)
Potassium: 3.2 mEq/L — ABNORMAL LOW (ref 3.5–5.1)
Sodium: 135 mEq/L — ABNORMAL LOW (ref 136–145)
Total Bilirubin: 0.31 mg/dL (ref 0.20–1.20)
Total Protein: 6.7 g/dL (ref 6.4–8.3)

## 2015-03-16 LAB — CBC WITH DIFFERENTIAL/PLATELET
BASO%: 1.4 % (ref 0.0–2.0)
Basophils Absolute: 0 10*3/uL (ref 0.0–0.1)
EOS%: 6.2 % (ref 0.0–7.0)
Eosinophils Absolute: 0.1 10*3/uL (ref 0.0–0.5)
HCT: 28 % — ABNORMAL LOW (ref 34.8–46.6)
HGB: 9.1 g/dL — ABNORMAL LOW (ref 11.6–15.9)
LYMPH%: 18.2 % (ref 14.0–49.7)
MCH: 30.5 pg (ref 25.1–34.0)
MCHC: 32.5 g/dL (ref 31.5–36.0)
MCV: 94 fL (ref 79.5–101.0)
MONO#: 0.3 10*3/uL (ref 0.1–0.9)
MONO%: 12.9 % (ref 0.0–14.0)
NEUT#: 1.3 10*3/uL — ABNORMAL LOW (ref 1.5–6.5)
NEUT%: 61.3 % (ref 38.4–76.8)
Platelets: 144 10*3/uL — ABNORMAL LOW (ref 145–400)
RBC: 2.98 10*6/uL — ABNORMAL LOW (ref 3.70–5.45)
RDW: 16.3 % — ABNORMAL HIGH (ref 11.2–14.5)
WBC: 2.1 10*3/uL — ABNORMAL LOW (ref 3.9–10.3)
lymph#: 0.4 10*3/uL — ABNORMAL LOW (ref 0.9–3.3)

## 2015-03-16 MED ORDER — PACLITAXEL PROTEIN-BOUND CHEMO INJECTION 100 MG
65.0000 mg/m2 | Freq: Once | INTRAVENOUS | Status: AC
  Administered 2015-03-16: 100 mg via INTRAVENOUS
  Filled 2015-03-16: qty 20

## 2015-03-16 MED ORDER — HEPARIN SOD (PORK) LOCK FLUSH 100 UNIT/ML IV SOLN
500.0000 [IU] | Freq: Once | INTRAVENOUS | Status: AC | PRN
  Administered 2015-03-16: 500 [IU]
  Filled 2015-03-16: qty 5

## 2015-03-16 MED ORDER — SODIUM CHLORIDE 0.9 % IJ SOLN
10.0000 mL | INTRAMUSCULAR | Status: DC | PRN
  Administered 2015-03-16: 10 mL
  Filled 2015-03-16: qty 10

## 2015-03-16 MED ORDER — PALONOSETRON HCL INJECTION 0.25 MG/5ML
INTRAVENOUS | Status: AC
  Filled 2015-03-16: qty 5

## 2015-03-16 MED ORDER — SODIUM CHLORIDE 0.9 % IV SOLN
Freq: Once | INTRAVENOUS | Status: AC
  Administered 2015-03-16: 13:00:00 via INTRAVENOUS

## 2015-03-16 MED ORDER — PALONOSETRON HCL INJECTION 0.25 MG/5ML
0.2500 mg | Freq: Once | INTRAVENOUS | Status: AC
  Administered 2015-03-16: 0.25 mg via INTRAVENOUS

## 2015-03-16 NOTE — Patient Instructions (Signed)
[facility] Discharge Instructions for Patients Receiving Chemotherapy  Today you received the following chemotherapy agents: Abraxane   To help prevent nausea and vomiting after your treatment, we encourage you to take your nausea medication as directed.    If you develop nausea and vomiting that is not controlled by your nausea medication, call the clinic.   BELOW ARE SYMPTOMS THAT SHOULD BE REPORTED IMMEDIATELY:  *FEVER GREATER THAN 100.5 F  *CHILLS WITH OR WITHOUT FEVER  NAUSEA AND VOMITING THAT IS NOT CONTROLLED WITH YOUR NAUSEA MEDICATION  *UNUSUAL SHORTNESS OF BREATH  *UNUSUAL BRUISING OR BLEEDING  TENDERNESS IN MOUTH AND THROAT WITH OR WITHOUT PRESENCE OF ULCERS  *URINARY PROBLEMS  *BOWEL PROBLEMS  UNUSUAL RASH Items with * indicate a potential emergency and should be followed up as soon as possible.  Feel free to call the clinic you have any questions or concerns. The clinic phone number is [phone].  Please show the CHEMO ALERT CARD at check-in to the Emergency Department and triage nurse.   

## 2015-03-16 NOTE — Progress Notes (Signed)
Reviewed labs with Dr. Lindi Adie Potassium 3.2 and ANC 1.3 Pt reports worsening left breast pain and congestion, stating she has a productive cough but secretions are clear. Dr. Lindi Adie aware. Okay to treat and pt is to start taking a claritin everyday. If cough or congestion worsens or pt develops a fever, pt is to call clinic. Pt aware and verbalizes understanding.

## 2015-03-22 NOTE — Assessment & Plan Note (Signed)
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 Neo-adjuvant chemotherapy with Adriamycin and Cytoxan every 2 weeks 4 followed by Abraxane weekly 12 2. Followed by surgery which could be with mastectomy versus lumpectomy (patient may need biopsies to demonstrate extent of disease after neo-adjuvant chemotherapy) 3. Following surgery she may need adjuvant radiation if she undergoes lumpectomy  Current treatment: cycle 11/12 of Abraxane (patient could not receive cycle 4 Adriamycin and Cytoxan because of fixed drug eruption)  Chemotherapy toxicities: 1. Frequent urination: improved 2. Bone pain: Related to Neulasta. Improvement with Claritin. 3. Neutropenia 4. Normocytic anemia: Hemoglobin 9.1 stable (prior to starting chemotherapy she was anemic with a hemoglobin of 10.6) 5. Hypokalemia: Potassium is 3.1 on oral potassium 6. Hot flashes for 24 hours after chemotherapy 7. Alopecia 8. Rash on the hands: itching related to chemotherapy with Adriamycin and Cytoxan. Fourth cycle of chemotherapy was not given for this reason.  9. Watering eyes and nose: patient takes Claritin. 10. Bronchitis 02/09/2015: Prescribed amoxicillin 11. Neuropathy grade 1: We'll continue to watch and monitor 12. Neutropenia: Progressively getting worse with each treatment. ANC 1000 on 02/23/2015 we reduced the dosage of chemotherapy to 65 mg/m with week 7 and this improved the ANC with week 8  I set her up for breast MRI for 04/05/2015 and clinic follow-up 04/07/2015

## 2015-03-23 ENCOUNTER — Ambulatory Visit: Payer: Managed Care, Other (non HMO)

## 2015-03-23 ENCOUNTER — Encounter: Payer: Self-pay | Admitting: Hematology and Oncology

## 2015-03-23 ENCOUNTER — Ambulatory Visit (HOSPITAL_BASED_OUTPATIENT_CLINIC_OR_DEPARTMENT_OTHER): Payer: Managed Care, Other (non HMO)

## 2015-03-23 ENCOUNTER — Ambulatory Visit (HOSPITAL_BASED_OUTPATIENT_CLINIC_OR_DEPARTMENT_OTHER): Payer: Managed Care, Other (non HMO) | Admitting: Hematology and Oncology

## 2015-03-23 ENCOUNTER — Other Ambulatory Visit (HOSPITAL_BASED_OUTPATIENT_CLINIC_OR_DEPARTMENT_OTHER): Payer: Managed Care, Other (non HMO)

## 2015-03-23 ENCOUNTER — Encounter: Payer: Self-pay | Admitting: *Deleted

## 2015-03-23 ENCOUNTER — Telehealth: Payer: Self-pay | Admitting: Hematology and Oncology

## 2015-03-23 VITALS — BP 149/67 | HR 86 | Temp 98.3°F | Resp 18 | Ht 66.0 in | Wt 110.9 lb

## 2015-03-23 DIAGNOSIS — C50812 Malignant neoplasm of overlapping sites of left female breast: Secondary | ICD-10-CM

## 2015-03-23 DIAGNOSIS — C50412 Malignant neoplasm of upper-outer quadrant of left female breast: Secondary | ICD-10-CM

## 2015-03-23 DIAGNOSIS — D709 Neutropenia, unspecified: Secondary | ICD-10-CM | POA: Diagnosis not present

## 2015-03-23 DIAGNOSIS — Z5111 Encounter for antineoplastic chemotherapy: Secondary | ICD-10-CM | POA: Diagnosis not present

## 2015-03-23 LAB — CBC WITH DIFFERENTIAL/PLATELET
BASO%: 1.3 % (ref 0.0–2.0)
Basophils Absolute: 0 10*3/uL (ref 0.0–0.1)
EOS%: 5.8 % (ref 0.0–7.0)
Eosinophils Absolute: 0.1 10*3/uL (ref 0.0–0.5)
HCT: 27.4 % — ABNORMAL LOW (ref 34.8–46.6)
HGB: 9.2 g/dL — ABNORMAL LOW (ref 11.6–15.9)
LYMPH%: 17.5 % (ref 14.0–49.7)
MCH: 30.9 pg (ref 25.1–34.0)
MCHC: 33.7 g/dL (ref 31.5–36.0)
MCV: 91.5 fL (ref 79.5–101.0)
MONO#: 0.2 10*3/uL (ref 0.1–0.9)
MONO%: 9.9 % (ref 0.0–14.0)
NEUT#: 1.6 10*3/uL (ref 1.5–6.5)
NEUT%: 65.5 % (ref 38.4–76.8)
Platelets: 180 10*3/uL (ref 145–400)
RBC: 3 10*6/uL — ABNORMAL LOW (ref 3.70–5.45)
RDW: 17.4 % — ABNORMAL HIGH (ref 11.2–14.5)
WBC: 2.5 10*3/uL — ABNORMAL LOW (ref 3.9–10.3)
lymph#: 0.4 10*3/uL — ABNORMAL LOW (ref 0.9–3.3)

## 2015-03-23 LAB — COMPREHENSIVE METABOLIC PANEL (CC13)
ALT: 27 U/L (ref 0–55)
AST: 32 U/L (ref 5–34)
Albumin: 3.8 g/dL (ref 3.5–5.0)
Alkaline Phosphatase: 69 U/L (ref 40–150)
Anion Gap: 7 mEq/L (ref 3–11)
BUN: 7 mg/dL (ref 7.0–26.0)
CO2: 29 mEq/L (ref 22–29)
Calcium: 9.3 mg/dL (ref 8.4–10.4)
Chloride: 98 mEq/L (ref 98–109)
Creatinine: 0.5 mg/dL — ABNORMAL LOW (ref 0.6–1.1)
EGFR: >90 mL/min/{1.73_m2} (ref >90)
Glucose: 90 mg/dl (ref 70–140)
Potassium: 3.4 mEq/L — ABNORMAL LOW (ref 3.5–5.1)
Sodium: 134 mEq/L — ABNORMAL LOW (ref 136–145)
Total Bilirubin: <0.3 mg/dL (ref 0.20–1.20)
Total Protein: 6.4 g/dL (ref 6.4–8.3)

## 2015-03-23 MED ORDER — PACLITAXEL PROTEIN-BOUND CHEMO INJECTION 100 MG
65.0000 mg/m2 | Freq: Once | INTRAVENOUS | Status: AC
  Administered 2015-03-23: 100 mg via INTRAVENOUS
  Filled 2015-03-23: qty 20

## 2015-03-23 MED ORDER — SODIUM CHLORIDE 0.9 % IJ SOLN
10.0000 mL | INTRAMUSCULAR | Status: DC | PRN
  Administered 2015-03-23: 10 mL
  Filled 2015-03-23: qty 10

## 2015-03-23 MED ORDER — PALONOSETRON HCL INJECTION 0.25 MG/5ML
INTRAVENOUS | Status: AC
  Filled 2015-03-23: qty 5

## 2015-03-23 MED ORDER — HEPARIN SOD (PORK) LOCK FLUSH 100 UNIT/ML IV SOLN
500.0000 [IU] | Freq: Once | INTRAVENOUS | Status: AC | PRN
  Administered 2015-03-23: 500 [IU]
  Filled 2015-03-23: qty 5

## 2015-03-23 MED ORDER — SODIUM CHLORIDE 0.9 % IV SOLN
Freq: Once | INTRAVENOUS | Status: AC
  Administered 2015-03-23: 15:00:00 via INTRAVENOUS

## 2015-03-23 MED ORDER — PALONOSETRON HCL INJECTION 0.25 MG/5ML
0.2500 mg | Freq: Once | INTRAVENOUS | Status: AC
  Administered 2015-03-23: 0.25 mg via INTRAVENOUS

## 2015-03-23 NOTE — Progress Notes (Signed)
Patient Care Team: Chipper Herb, MD as PCP - General (Family Medicine) Dr Geanie Berlin (Gynecology) Holley Bouche, NP as Nurse Practitioner (Nurse Practitioner)  DIAGNOSIS: Breast cancer of upper-outer quadrant of left female breast [facility])   Staging form: Breast, AJCC 7th Edition     Clinical stage from 11/18/2014: Stage IIB (T3, N0, M0) - Unsigned   SUMMARY OF ONCOLOGIC HISTORY:   Breast cancer of upper-outer quadrant of left female breast ([place])   11/09/2014 Mammogram Left breast irregular hypoechoic mass 12:00 position 5.3 x 3.7 x 2.9 cm, left low axilla normal sized lymph nodes   11/09/2014 Initial Diagnosis Left Bx: IDC with DCIS; Grade 3, Er 0%, PR 0%, Her 2 Neg, Ki 67: 70%   11/30/2014 -  Neo-Adjuvant Chemotherapy Dose dense Adriamycin and Cytoxan 4 followed by Abraxane weekly 12    CHIEF COMPLIANT:  Abraxane week 11/12  INTERVAL HISTORY: Sherry Bender is a  64 year old with above-mentioned history of left breast cancer currently on neo-adjuvant chemotherapy with Abraxane. Today is week 11 of treatment. She is tolerating it extremely well. She complains of fatigue after chemotherapy and 1 day of diarrhea. Apart from that she has no neuropathy. Denies any nausea or vomiting. Her energy levels are back to her baseline as of today.  REVIEW OF SYSTEMS:   Constitutional: Denies fevers, chills or abnormal weight loss Eyes: Denies blurriness of vision Ears, nose, mouth, throat, and face: Denies mucositis or sore throat Respiratory: Denies cough, dyspnea or wheezes Cardiovascular: Denies palpitation, chest discomfort or lower extremity swelling Gastrointestinal:   Diarrhea intermittently Skin: Denies abnormal skin rashes Lymphatics: Denies new lymphadenopathy or easy bruising Neurological:Denies numbness, tingling or new weaknesses Behavioral/Psych: Mood is stable, no new changes  All other systems were reviewed with the patient and are negative.  I have reviewed the past medical history,  past surgical history, social history and family history with the patient and they are unchanged from previous note.  ALLERGIES:  is allergic to codeine.  MEDICATIONS:  Current Outpatient Prescriptions  Medication Sig Dispense Refill  . dexamethasone (DECADRON) 4 MG tablet TAKE 1 TABLET ON THE DAY AFTER CHEMOTHERAPY AND THEN 1 TABLET TWICE A DAY FOR 2 DAYS WITH FOOD  1  . dexlansoprazole (DEXILANT) 60 MG capsule Take 1 capsule (60 mg total) by mouth daily. 30 capsule 4  . diazepam (VALIUM) 5 MG tablet Take 1 tablet (5 mg total) by mouth every 6 (six) hours as needed for anxiety. 30 tablet 0  . estradiol (ESTRACE) 1 MG tablet Take 1 mg by mouth daily.      . hydrochlorothiazide (HYDRODIURIL) 25 MG tablet Take 1 tablet (25 mg total) by mouth daily. 90 tablet 0  . HYDROcodone-acetaminophen (NORCO/VICODIN) 5-325 MG per tablet     . LORazepam (ATIVAN) 0.5 MG tablet Take 0.5 mg by mouth at bedtime.  0  . Multiple Vitamin (MULTIVITAMIN) tablet Take 1 tablet by mouth daily.    . ondansetron (ZOFRAN) 8 MG tablet TAKE 1 TABLET BY MOUTH TWICE A DAY AS NEEDED,START ON THE 3RD DAY AFTER CHEMOTHERAPY  1  . Phenazopyridine HCl (AZO TABS PO) Take 1 tablet by mouth.    . potassium chloride (MICRO-K) 10 MEQ CR capsule Take 1 capsule (10 mEq total) by mouth daily. 30 capsule 1  . prochlorperazine (COMPAZINE) 10 MG tablet TAKE 1 TABLET (10 MG TOTAL) BY MOUTH EVERY 6 (SIX) HOURS AS NEEDED (NAUSEA OR VOMITING).  1   No current facility-administered medications for this visit.  PHYSICAL EXAMINATION: ECOG PERFORMANCE STATUS: 1 - Symptomatic but completely ambulatory  Filed Vitals:   03/23/15 1328  BP: 149/67  Pulse: 86  Temp: 98.3 F (36.8 C)  Resp: 18   Filed Weights   03/23/15 1328  Weight: 110 lb 14.4 oz (50.304 kg)    GENERAL:alert, no distress and comfortable SKIN: skin color, texture, turgor are normal, no rashes or significant lesions EYES: normal, Conjunctiva are pink and non-injected,  sclera clear OROPHARYNX:no exudate, no erythema and lips, buccal mucosa, and tongue normal  NECK: supple, thyroid normal size, non-tender, without nodularity LYMPH:  no palpable lymphadenopathy in the cervical, axillary or inguinal LUNGS: clear to auscultation and percussion with normal breathing effort HEART: regular rate & rhythm and no murmurs and no lower extremity edema ABDOMEN:abdomen soft, non-tender and normal bowel sounds Musculoskeletal:no cyanosis of digits and no clubbing  NEURO: alert & oriented x 3 with fluent speech, no focal motor/sensory deficits  LABORATORY DATA:  I have reviewed the data as listed   Chemistry      Component Value Date/Time   NA 135* 03/16/2015 1028   NA 133* 06/20/2014 2042   K 3.2* 03/16/2015 1028   K 3.4* 06/20/2014 2042   CL 97 06/20/2014 2042   CO2 27 03/16/2015 1028   CO2 22 06/20/2014 2042   BUN 7.7 03/16/2015 1028   BUN 8 06/20/2014 2042   CREATININE 0.5* 03/16/2015 1028   CREATININE 0.37* 06/20/2014 2042      Component Value Date/Time   CALCIUM 9.2 03/16/2015 1028   CALCIUM 9.1 06/20/2014 2042   ALKPHOS 82 03/16/2015 1028   AST 36* 03/16/2015 1028   ALT 28 03/16/2015 1028   BILITOT 0.31 03/16/2015 1028       Lab Results  Component Value Date   WBC 2.5* 03/23/2015   HGB 9.2* 03/23/2015   HCT 27.4* 03/23/2015   MCV 91.5 03/23/2015   PLT 180 03/23/2015   NEUTROABS 1.6 03/23/2015   ASSESSMENT & PLAN:  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 Neo-adjuvant chemotherapy with Adriamycin and Cytoxan every 2 weeks 4 followed by Abraxane weekly 12 2. Followed by surgery which could be with mastectomy versus lumpectomy (patient may need biopsies to demonstrate  extent of disease after neo-adjuvant chemotherapy) 3. Following surgery she may need adjuvant radiation if she undergoes lumpectomy  Current treatment: cycle 11/12 of Abraxane (patient could not receive cycle 4 Adriamycin and Cytoxan because of fixed drug eruption)  Chemotherapy toxicities: 1. Frequent urination: improved 2. Bone pain: Related to Neulasta. Improvement with Claritin. 3. Neutropenia 4. Normocytic anemia: Hemoglobin 9.1 stable (prior to starting chemotherapy she was anemic with a hemoglobin of 10.6) 5. Hypokalemia: Potassium is 3.1 on oral potassium 6. Hot flashes for 24 hours after chemotherapy 7. Alopecia 8. Rash on the hands: itching related to chemotherapy with Adriamycin and Cytoxan. Fourth cycle of chemotherapy was not given for this reason.  9. Watering eyes and nose: patient takes Claritin. 10. Bronchitis 02/09/2015: Prescribed amoxicillin 11. Neuropathy grade 1: We'll continue to watch and monitor 12. Neutropenia: Progressively getting worse with each treatment. ANC 1000 on 02/23/2015 we reduced the dosage of chemotherapy to 65 mg/m with week 7 and this improved the ANC  with week 8  I set her up for breast MRI for 04/05/2015 and clinic follow-up 04/09/2015   No orders of the defined types were placed in this encounter.   The patient has a good understanding of the overall plan. she agrees with it. she will call with any problems that may develop before the next visit here.   Rulon Eisenmenger, MD

## 2015-03-23 NOTE — Telephone Encounter (Signed)
Appointments made and avs printed for pateint °

## 2015-03-23 NOTE — Patient Instructions (Signed)
[facility] Discharge Instructions for Patients Receiving Chemotherapy  Today you received the following chemotherapy agents abraxane  To help prevent nausea and vomiting after your treatment, we encourage you to take your nausea medication    If you develop nausea and vomiting that is not controlled by your nausea medication, call the clinic.   BELOW ARE SYMPTOMS THAT SHOULD BE REPORTED IMMEDIATELY:  *FEVER GREATER THAN 100.5 F  *CHILLS WITH OR WITHOUT FEVER  NAUSEA AND VOMITING THAT IS NOT CONTROLLED WITH YOUR NAUSEA MEDICATION  *UNUSUAL SHORTNESS OF BREATH  *UNUSUAL BRUISING OR BLEEDING  TENDERNESS IN MOUTH AND THROAT WITH OR WITHOUT PRESENCE OF ULCERS  *URINARY PROBLEMS  *BOWEL PROBLEMS  UNUSUAL RASH Items with * indicate a potential emergency and should be followed up as soon as possible.  Feel free to call the clinic you have any questions or concerns. The clinic phone number is [phone].  Please show the CHEMO ALERT CARD at check-in to the Emergency Department and triage nurse.   

## 2015-03-30 ENCOUNTER — Ambulatory Visit (HOSPITAL_BASED_OUTPATIENT_CLINIC_OR_DEPARTMENT_OTHER): Payer: Managed Care, Other (non HMO)

## 2015-03-30 ENCOUNTER — Other Ambulatory Visit: Payer: Self-pay | Admitting: Hematology and Oncology

## 2015-03-30 ENCOUNTER — Telehealth: Payer: Self-pay | Admitting: Hematology and Oncology

## 2015-03-30 ENCOUNTER — Ambulatory Visit: Payer: Managed Care, Other (non HMO)

## 2015-03-30 ENCOUNTER — Other Ambulatory Visit: Payer: Self-pay

## 2015-03-30 ENCOUNTER — Other Ambulatory Visit (HOSPITAL_BASED_OUTPATIENT_CLINIC_OR_DEPARTMENT_OTHER): Payer: Managed Care, Other (non HMO)

## 2015-03-30 ENCOUNTER — Ambulatory Visit: Payer: Managed Care, Other (non HMO) | Admitting: Hematology and Oncology

## 2015-03-30 VITALS — BP 153/71 | HR 78 | Temp 97.4°F

## 2015-03-30 DIAGNOSIS — C50812 Malignant neoplasm of overlapping sites of left female breast: Secondary | ICD-10-CM

## 2015-03-30 DIAGNOSIS — C50412 Malignant neoplasm of upper-outer quadrant of left female breast: Secondary | ICD-10-CM

## 2015-03-30 DIAGNOSIS — Z5111 Encounter for antineoplastic chemotherapy: Secondary | ICD-10-CM | POA: Diagnosis not present

## 2015-03-30 LAB — CBC WITH DIFFERENTIAL/PLATELET
BASO%: 1.9 % (ref 0.0–2.0)
Basophils Absolute: 0 10*3/uL (ref 0.0–0.1)
EOS%: 7.1 % — ABNORMAL HIGH (ref 0.0–7.0)
Eosinophils Absolute: 0.2 10*3/uL (ref 0.0–0.5)
HCT: 28.4 % — ABNORMAL LOW (ref 34.8–46.6)
HGB: 9.2 g/dL — ABNORMAL LOW (ref 11.6–15.9)
LYMPH%: 14.8 % (ref 14.0–49.7)
MCH: 30 pg (ref 25.1–34.0)
MCHC: 32.4 g/dL (ref 31.5–36.0)
MCV: 92.5 fL (ref 79.5–101.0)
MONO#: 0.2 10*3/uL (ref 0.1–0.9)
MONO%: 9.5 % (ref 0.0–14.0)
NEUT#: 1.4 10*3/uL — ABNORMAL LOW (ref 1.5–6.5)
NEUT%: 66.7 % (ref 38.4–76.8)
Platelets: 115 10*3/uL — ABNORMAL LOW (ref 145–400)
RBC: 3.07 10*6/uL — ABNORMAL LOW (ref 3.70–5.45)
RDW: 16 % — ABNORMAL HIGH (ref 11.2–14.5)
WBC: 2.1 10*3/uL — ABNORMAL LOW (ref 3.9–10.3)
lymph#: 0.3 10*3/uL — ABNORMAL LOW (ref 0.9–3.3)

## 2015-03-30 LAB — COMPREHENSIVE METABOLIC PANEL (CC13)
ALT: 23 U/L (ref 0–55)
AST: 31 U/L (ref 5–34)
Albumin: 3.7 g/dL (ref 3.5–5.0)
Alkaline Phosphatase: 69 U/L (ref 40–150)
Anion Gap: 8 mEq/L (ref 3–11)
BUN: 7.3 mg/dL (ref 7.0–26.0)
CO2: 28 mEq/L (ref 22–29)
Calcium: 9.2 mg/dL (ref 8.4–10.4)
Chloride: 98 mEq/L (ref 98–109)
Creatinine: 0.5 mg/dL — ABNORMAL LOW (ref 0.6–1.1)
EGFR: >90 mL/min/{1.73_m2} (ref >90)
Glucose: 80 mg/dl (ref 70–140)
Potassium: 3.3 mEq/L — ABNORMAL LOW (ref 3.5–5.1)
Sodium: 134 mEq/L — ABNORMAL LOW (ref 136–145)
Total Bilirubin: 0.3 mg/dL (ref 0.20–1.20)
Total Protein: 6.3 g/dL — ABNORMAL LOW (ref 6.4–8.3)

## 2015-03-30 MED ORDER — PACLITAXEL PROTEIN-BOUND CHEMO INJECTION 100 MG
65.0000 mg/m2 | Freq: Once | INTRAVENOUS | Status: AC
  Administered 2015-03-30: 100 mg via INTRAVENOUS
  Filled 2015-03-30: qty 20

## 2015-03-30 MED ORDER — SODIUM CHLORIDE 0.9 % IV SOLN
Freq: Once | INTRAVENOUS | Status: AC
  Administered 2015-03-30: 13:00:00 via INTRAVENOUS

## 2015-03-30 MED ORDER — HEPARIN SOD (PORK) LOCK FLUSH 100 UNIT/ML IV SOLN
500.0000 [IU] | Freq: Once | INTRAVENOUS | Status: AC | PRN
  Administered 2015-03-30: 500 [IU]
  Filled 2015-03-30: qty 5

## 2015-03-30 MED ORDER — PALONOSETRON HCL INJECTION 0.25 MG/5ML
INTRAVENOUS | Status: AC
Start: 2015-03-30 — End: 2015-03-30
  Filled 2015-03-30: qty 5

## 2015-03-30 MED ORDER — SODIUM CHLORIDE 0.9 % IJ SOLN
10.0000 mL | INTRAMUSCULAR | Status: DC | PRN
  Administered 2015-03-30: 10 mL
  Filled 2015-03-30: qty 10

## 2015-03-30 MED ORDER — PALONOSETRON HCL INJECTION 0.25 MG/5ML
0.2500 mg | Freq: Once | INTRAVENOUS | Status: AC
  Administered 2015-03-30: 0.25 mg via INTRAVENOUS

## 2015-03-30 NOTE — Progress Notes (Signed)
Reviewed labs and noted ANC to be 1.4. Reviewed with Dr. Lindi Adie. OK to treat with dosage adjustments

## 2015-03-30 NOTE — Telephone Encounter (Signed)
Called and left a message with added lab 10/21

## 2015-03-30 NOTE — Patient Instructions (Signed)
[facility] Discharge Instructions for Patients Receiving Chemotherapy  Today you received the following chemotherapy agents: Abraxane.  To help prevent nausea and vomiting after your treatment, we encourage you to take your nausea medication:  Compazine 10 mg every 6 hours as needed.   If you develop nausea and vomiting that is not controlled by your nausea medication, call the clinic.   BELOW ARE SYMPTOMS THAT SHOULD BE REPORTED IMMEDIATELY:  *FEVER GREATER THAN 100.5 F  *CHILLS WITH OR WITHOUT FEVER  NAUSEA AND VOMITING THAT IS NOT CONTROLLED WITH YOUR NAUSEA MEDICATION  *UNUSUAL SHORTNESS OF BREATH  *UNUSUAL BRUISING OR BLEEDING  TENDERNESS IN MOUTH AND THROAT WITH OR WITHOUT PRESENCE OF ULCERS  *URINARY PROBLEMS  *BOWEL PROBLEMS  UNUSUAL RASH Items with * indicate a potential emergency and should be followed up as soon as possible.  Feel free to call the clinic you have any questions or concerns. The clinic phone number is [phone].  Please show the CHEMO ALERT CARD at check-in to the Emergency Department and triage nurse.   

## 2015-04-03 ENCOUNTER — Ambulatory Visit
Admission: RE | Admit: 2015-04-03 | Discharge: 2015-04-03 | Disposition: A | Discharge status: Home / Self Care | Payer: Managed Care, Other (non HMO) | Source: Ambulatory Visit | Attending: Hematology and Oncology | Admitting: Hematology and Oncology

## 2015-04-03 DIAGNOSIS — C50412 Malignant neoplasm of upper-outer quadrant of left female breast: Secondary | ICD-10-CM

## 2015-04-03 MED ORDER — GADOBENATE DIMEGLUMINE 529 MG/ML IV SOLN
10.0000 mL | Freq: Once | INTRAVENOUS | Status: AC | PRN
  Administered 2015-04-03: 10 mL via INTRAVENOUS

## 2015-04-05 ENCOUNTER — Inpatient Hospital Stay: Admission: RE | Admit: 2015-04-05 | Payer: Managed Care, Other (non HMO) | Source: Ambulatory Visit

## 2015-04-06 ENCOUNTER — Ambulatory Visit: Payer: Managed Care, Other (non HMO)

## 2015-04-08 ENCOUNTER — Other Ambulatory Visit: Payer: Self-pay | Admitting: General Surgery

## 2015-04-08 ENCOUNTER — Encounter: Payer: Self-pay | Admitting: *Deleted

## 2015-04-08 DIAGNOSIS — R921 Mammographic calcification found on diagnostic imaging of breast: Secondary | ICD-10-CM

## 2015-04-08 NOTE — Assessment & Plan Note (Signed)
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 Neo-adjuvant chemotherapy with Adriamycin and Cytoxan every 2 weeks 4 followed by Abraxane weekly 12 started 12/17/2014 completed 03/30/2015 2. Followed by surgery which could be with mastectomy versus lumpectomy (patient may need biopsies to demonstrate extent of disease after neo-adjuvant chemotherapy) 3. Following surgery she may need adjuvant radiation if she undergoes lumpectomy  Radiology review: I discussed the breast MRI result which showed a complete clinical response to neoadjuvant chemotherapy. I provided her with a copy of report.  Plan: 1. Biopsies to confirm that there is no evidence of DCIS before pursuing with lumpectomy. 2. Lumpectomy followed by adjuvant radiation  

## 2015-04-09 ENCOUNTER — Telehealth: Payer: Self-pay | Admitting: Hematology and Oncology

## 2015-04-09 ENCOUNTER — Other Ambulatory Visit (HOSPITAL_BASED_OUTPATIENT_CLINIC_OR_DEPARTMENT_OTHER): Payer: Managed Care, Other (non HMO)

## 2015-04-09 ENCOUNTER — Ambulatory Visit (HOSPITAL_BASED_OUTPATIENT_CLINIC_OR_DEPARTMENT_OTHER): Payer: Managed Care, Other (non HMO) | Admitting: Hematology and Oncology

## 2015-04-09 ENCOUNTER — Other Ambulatory Visit: Payer: Self-pay | Admitting: General Surgery

## 2015-04-09 ENCOUNTER — Encounter: Payer: Self-pay | Admitting: Hematology and Oncology

## 2015-04-09 VITALS — BP 150/76 | HR 87 | Temp 98.0°F | Resp 18 | Ht 66.0 in | Wt 109.3 lb

## 2015-04-09 DIAGNOSIS — D649 Anemia, unspecified: Secondary | ICD-10-CM

## 2015-04-09 DIAGNOSIS — C50812 Malignant neoplasm of overlapping sites of left female breast: Secondary | ICD-10-CM

## 2015-04-09 DIAGNOSIS — C50412 Malignant neoplasm of upper-outer quadrant of left female breast: Secondary | ICD-10-CM

## 2015-04-09 DIAGNOSIS — C50112 Malignant neoplasm of central portion of left female breast: Secondary | ICD-10-CM

## 2015-04-09 DIAGNOSIS — Z23 Encounter for immunization: Secondary | ICD-10-CM

## 2015-04-09 LAB — COMPREHENSIVE METABOLIC PANEL (CC13)
ALT: 26 U/L (ref 0–55)
AST: 31 U/L (ref 5–34)
Albumin: 4 g/dL (ref 3.5–5.0)
Alkaline Phosphatase: 73 U/L (ref 40–150)
Anion Gap: 8 mEq/L (ref 3–11)
BUN: 8.7 mg/dL (ref 7.0–26.0)
CO2: 29 mEq/L (ref 22–29)
Calcium: 9.8 mg/dL (ref 8.4–10.4)
Chloride: 100 mEq/L (ref 98–109)
Creatinine: 0.5 mg/dL — ABNORMAL LOW (ref 0.6–1.1)
EGFR: >90 mL/min/{1.73_m2} (ref >90)
Glucose: 74 mg/dl (ref 70–140)
Potassium: 3.3 mEq/L — ABNORMAL LOW (ref 3.5–5.1)
Sodium: 136 mEq/L (ref 136–145)
Total Bilirubin: <0.3 mg/dL (ref 0.20–1.20)
Total Protein: 7 g/dL (ref 6.4–8.3)

## 2015-04-09 LAB — CBC WITH DIFFERENTIAL/PLATELET
BASO%: 1.1 % (ref 0.0–2.0)
Basophils Absolute: 0 10*3/uL (ref 0.0–0.1)
EOS%: 4.2 % (ref 0.0–7.0)
Eosinophils Absolute: 0.1 10*3/uL (ref 0.0–0.5)
HCT: 30.3 % — ABNORMAL LOW (ref 34.8–46.6)
HGB: 10 g/dL — ABNORMAL LOW (ref 11.6–15.9)
LYMPH%: 13.7 % — ABNORMAL LOW (ref 14.0–49.7)
MCH: 30.1 pg (ref 25.1–34.0)
MCHC: 32.8 g/dL (ref 31.5–36.0)
MCV: 91.6 fL (ref 79.5–101.0)
MONO#: 0.5 10*3/uL (ref 0.1–0.9)
MONO%: 14.3 % — ABNORMAL HIGH (ref 0.0–14.0)
NEUT#: 2.1 10*3/uL (ref 1.5–6.5)
NEUT%: 66.7 % (ref 38.4–76.8)
Platelets: 170 10*3/uL (ref 145–400)
RBC: 3.31 10*6/uL — ABNORMAL LOW (ref 3.70–5.45)
RDW: 18 % — ABNORMAL HIGH (ref 11.2–14.5)
WBC: 3.2 10*3/uL — ABNORMAL LOW (ref 3.9–10.3)
lymph#: 0.4 10*3/uL — ABNORMAL LOW (ref 0.9–3.3)

## 2015-04-09 MED ORDER — INFLUENZA VAC SPLIT QUAD 0.5 ML IM SUSY
0.5000 mL | PREFILLED_SYRINGE | Freq: Once | INTRAMUSCULAR | Status: AC
  Administered 2015-04-09: 0.5 mL via INTRAMUSCULAR
  Filled 2015-04-09: qty 0.5

## 2015-04-09 NOTE — Progress Notes (Signed)
Patient Care Team: Chipper Herb, MD as PCP - General (Family Medicine) Dr Geanie Berlin (Gynecology) Holley Bouche, NP as Nurse Practitioner (Nurse Practitioner)  DIAGNOSIS: Breast cancer of upper-outer quadrant of left female breast [facility])   Staging form: Breast, AJCC 7th Edition     Clinical stage from 11/18/2014: Stage IIB (T3, N0, M0) - Unsigned   SUMMARY OF ONCOLOGIC HISTORY:   Breast cancer of upper-outer quadrant of left female breast ([place])   11/09/2014 Mammogram Left breast irregular hypoechoic mass 12:00 position 5.3 x 3.7 x 2.9 cm, left low axilla normal sized lymph nodes   11/09/2014 Initial Diagnosis Left Bx: IDC with DCIS; Grade 3, Er 0%, PR 0%, Her 2 Neg, Ki 67: 70%   11/30/2014 -  Neo-Adjuvant Chemotherapy Dose dense Adriamycin and Cytoxan 4 followed by Abraxane weekly 12   04/05/2015 Breast MRI Breast complete imaging response to neoadjuvant chemotherapy    CHIEF COMPLIANT: Follow-up to discuss breast MRI after neoadjuvant chemotherapy  INTERVAL HISTORY: Sherry Bender is a 64 year old with above-mentioned history of left breast cancer who completed neoadjuvant chemotherapy and is here after undergoing MRI of the breasts. She had a complete clinical radiological response to chemotherapy. She does not have neuropathy. Does feel moderately fatigued. Denies any nausea or vomiting.  REVIEW OF SYSTEMS:   Constitutional: Denies fevers, chills or abnormal weight loss Eyes: Denies blurriness of vision Ears, nose, mouth, throat, and face: Denies mucositis or sore throat Respiratory: Denies cough, dyspnea or wheezes Cardiovascular: Denies palpitation, chest discomfort or lower extremity swelling Gastrointestinal:  Denies nausea, heartburn or change in bowel habits Skin: Denies abnormal skin rashes Lymphatics: Denies new lymphadenopathy or easy bruising Neurological:Denies numbness, tingling or new weaknesses Behavioral/Psych: Mood is stable, no new changes  Breast:  denies any pain  or lumps or nodules in either breasts All other systems were reviewed with the patient and are negative.  I have reviewed the past medical history, past surgical history, social history and family history with the patient and they are unchanged from previous note.  ALLERGIES:  is allergic to codeine.  MEDICATIONS:  Current Outpatient Prescriptions  Medication Sig Dispense Refill  . dexamethasone (DECADRON) 4 MG tablet TAKE 1 TABLET ON THE DAY AFTER CHEMOTHERAPY AND THEN 1 TABLET TWICE A DAY FOR 2 DAYS WITH FOOD  1  . dexlansoprazole (DEXILANT) 60 MG capsule Take 1 capsule (60 mg total) by mouth daily. 30 capsule 4  . diazepam (VALIUM) 5 MG tablet Take 1 tablet (5 mg total) by mouth every 6 (six) hours as needed for anxiety. 30 tablet 0  . estradiol (ESTRACE) 1 MG tablet Take 1 mg by mouth daily.      . hydrochlorothiazide (HYDRODIURIL) 25 MG tablet Take 1 tablet (25 mg total) by mouth daily. 90 tablet 0  . HYDROcodone-acetaminophen (NORCO/VICODIN) 5-325 MG per tablet     . LORazepam (ATIVAN) 0.5 MG tablet Take 0.5 mg by mouth at bedtime.  0  . Multiple Vitamin (MULTIVITAMIN) tablet Take 1 tablet by mouth daily.    . ondansetron (ZOFRAN) 8 MG tablet TAKE 1 TABLET BY MOUTH TWICE A DAY AS NEEDED,START ON THE 3RD DAY AFTER CHEMOTHERAPY  1  . Phenazopyridine HCl (AZO TABS PO) Take 1 tablet by mouth.    . potassium chloride (MICRO-K) 10 MEQ CR capsule Take 1 capsule (10 mEq total) by mouth daily. 30 capsule 1  . prochlorperazine (COMPAZINE) 10 MG tablet TAKE 1 TABLET (10 MG TOTAL) BY MOUTH EVERY 6 (SIX) HOURS AS NEEDED (NAUSEA  OR VOMITING).  1   No current facility-administered medications for this visit.    PHYSICAL EXAMINATION: ECOG PERFORMANCE STATUS: 1 - Symptomatic but completely ambulatory  Filed Vitals:   04/09/15 1118  BP: 150/76  Pulse: 87  Temp: 98 F (36.7 C)  Resp: 18   Filed Weights   04/09/15 1118  Weight: 109 lb 4.8 oz (49.578 kg)    GENERAL:alert, no distress and  comfortable SKIN: skin color, texture, turgor are normal, no rashes or significant lesions EYES: normal, Conjunctiva are pink and non-injected, sclera clear OROPHARYNX:no exudate, no erythema and lips, buccal mucosa, and tongue normal  NECK: supple, thyroid normal size, non-tender, without nodularity LYMPH:  no palpable lymphadenopathy in the cervical, axillary or inguinal LUNGS: clear to auscultation and percussion with normal breathing effort HEART: regular rate & rhythm and no murmurs and no lower extremity edema ABDOMEN:abdomen soft, non-tender and normal bowel sounds Musculoskeletal:no cyanosis of digits and no clubbing  NEURO: alert & oriented x 3 with fluent speech, no focal motor/sensory deficits   LABORATORY DATA:  I have reviewed the data as listed   Chemistry      Component Value Date/Time   NA 136 04/09/2015 1038   NA 133* 06/20/2014 2042   K 3.3* 04/09/2015 1038   K 3.4* 06/20/2014 2042   CL 97 06/20/2014 2042   CO2 29 04/09/2015 1038   CO2 22 06/20/2014 2042   BUN 8.7 04/09/2015 1038   BUN 8 06/20/2014 2042   CREATININE 0.5* 04/09/2015 1038   CREATININE 0.37* 06/20/2014 2042      Component Value Date/Time   CALCIUM 9.8 04/09/2015 1038   CALCIUM 9.1 06/20/2014 2042   ALKPHOS 73 04/09/2015 1038   AST 31 04/09/2015 1038   ALT 26 04/09/2015 1038   BILITOT <0.30 04/09/2015 1038       Lab Results  Component Value Date   WBC 3.2* 04/09/2015   HGB 10.0* 04/09/2015   HCT 30.3* 04/09/2015   MCV 91.6 04/09/2015   PLT 170 04/09/2015   NEUTROABS 2.1 04/09/2015    ASSESSMENT & PLAN:  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 Neo-adjuvant chemotherapy with Adriamycin and Cytoxan every 2 weeks  4 followed by Abraxane weekly 12 started 12/17/2014 completed 03/30/2015 2. Followed by surgery which could be with mastectomy versus lumpectomy (patient may need biopsies to demonstrate extent of disease after neo-adjuvant chemotherapy) 3. Following surgery she may need adjuvant radiation if she undergoes lumpectomy  Radiology review: I discussed the breast MRI result which showed a complete clinical response to neoadjuvant chemotherapy. I provided her with a copy of report.  Plan: 1. Patient is leaning towards having a mastectomy. That is the case then she will not need the biopsies. She would like to have a construction if she undergoes mastectomy. She was asking about bilateral mastectomies as well. I discussed with her that she needs to discuss with Dr. Dalbert Batman about her options. 2. If she gets Lumpectomy then she will need adjuvant radiation  3. Hypokalemia: On oral potassium supplementation 4. Anemia: Slowly improving hemoglobin up to 10 g  Return to clinic after surgery No orders of the defined types were placed in this encounter.   The patient  has a good understanding of the overall plan. she agrees with it. she will call with any problems that may develop before the next visit here.   Rulon Eisenmenger, MD 04/09/2015

## 2015-04-09 NOTE — Telephone Encounter (Signed)
Called patient and she states that her surgery will not be until 11/11 and i have rescheduled her appointment to a later date

## 2015-04-09 NOTE — Addendum Note (Signed)
Addended by: Prentiss Bells on: 04/09/2015 02:19 PM   Modules accepted: Orders, Medications

## 2015-04-13 ENCOUNTER — Telehealth: Payer: Self-pay | Admitting: *Deleted

## 2015-04-13 NOTE — Telephone Encounter (Signed)
Pt called to confirm her appt for after surgery. She was confused on the. Confirmed post op appt with Dr. Lindi Adie for 11/28. Relate doing well and without complaints. Encourage pt to call with further questions or needs.

## 2015-04-14 ENCOUNTER — Inpatient Hospital Stay: Admission: RE | Admit: 2015-04-14 | Payer: Managed Care, Other (non HMO) | Source: Ambulatory Visit

## 2015-04-22 ENCOUNTER — Encounter (HOSPITAL_COMMUNITY)
Admission: RE | Admit: 2015-04-22 | Discharge: 2015-04-22 | Disposition: A | Discharge status: Home / Self Care | Payer: Managed Care, Other (non HMO) | Source: Ambulatory Visit | Attending: General Surgery | Admitting: General Surgery

## 2015-04-22 ENCOUNTER — Encounter (HOSPITAL_COMMUNITY): Payer: Self-pay

## 2015-04-22 DIAGNOSIS — C50912 Malignant neoplasm of unspecified site of left female breast: Secondary | ICD-10-CM | POA: Insufficient documentation

## 2015-04-22 DIAGNOSIS — Z01812 Encounter for preprocedural laboratory examination: Secondary | ICD-10-CM | POA: Insufficient documentation

## 2015-04-22 LAB — CBC WITH DIFFERENTIAL/PLATELET
Basophils Absolute: 0 10*3/uL (ref 0.0–0.1)
Basophils Relative: 0 %
Eosinophils Absolute: 0.2 10*3/uL (ref 0.0–0.7)
Eosinophils Relative: 5 %
HCT: 31.6 % — ABNORMAL LOW (ref 36.0–46.0)
Hemoglobin: 10.2 g/dL — ABNORMAL LOW (ref 12.0–15.0)
Lymphocytes Relative: 12 %
Lymphs Abs: 0.6 10*3/uL — ABNORMAL LOW (ref 0.7–4.0)
MCH: 28 pg (ref 26.0–34.0)
MCHC: 32.3 g/dL (ref 30.0–36.0)
MCV: 86.8 fL (ref 78.0–100.0)
Monocytes Absolute: 0.6 10*3/uL (ref 0.1–1.0)
Monocytes Relative: 13 %
Neutro Abs: 3.4 10*3/uL (ref 1.7–7.7)
Neutrophils Relative %: 70 %
Platelets: 144 10*3/uL — ABNORMAL LOW (ref 150–400)
RBC: 3.64 MIL/uL — ABNORMAL LOW (ref 3.87–5.11)
RDW: 16 % — ABNORMAL HIGH (ref 11.5–15.5)
WBC: 4.9 10*3/uL (ref 4.0–10.5)

## 2015-04-22 LAB — COMPREHENSIVE METABOLIC PANEL
ALT: 24 U/L (ref 14–54)
AST: 34 U/L (ref 15–41)
Albumin: 3.8 g/dL (ref 3.5–5.0)
Alkaline Phosphatase: 64 U/L (ref 38–126)
Anion gap: 9 (ref 5–15)
BUN: 10 mg/dL (ref 6–20)
CO2: 30 mmol/L (ref 22–32)
Calcium: 9.5 mg/dL (ref 8.9–10.3)
Chloride: 94 mmol/L — ABNORMAL LOW (ref 101–111)
Creatinine, Ser: 0.33 mg/dL — ABNORMAL LOW (ref 0.44–1.00)
GFR calc Af Amer: >60 mL/min (ref >60)
GFR calc non Af Amer: >60 mL/min (ref >60)
Glucose, Bld: 83 mg/dL (ref 65–99)
Potassium: 3.2 mmol/L — ABNORMAL LOW (ref 3.5–5.1)
Sodium: 133 mmol/L — ABNORMAL LOW (ref 135–145)
Total Bilirubin: 0.3 mg/dL (ref 0.3–1.2)
Total Protein: 6.8 g/dL (ref 6.5–8.1)

## 2015-04-22 NOTE — Pre-Procedure Instructions (Signed)
Shloka Baldridge  04/22/2015      [facility] - [place], [place] - [address] [place] [address] Phone: [phone] Fax: [phone]  [id number] - [place], [place] [address] Phone: [phone] Fax: [phone]  [id number] - [place], [place] [address] Phone: [phone] Fax: [place], [place] [address] Phone: [phone] Fax: [phone]    Your procedure is scheduled on Nov 11  Report to [place] at 530 A.M.  Call this number if you have problems the morning of surgery:  [phone]   Remember:  Do not eat food or drink liquids after midnight.  Take these medicines the morning of surgery with A SIP OF WATER dexansoprazole (Dexilant), estradiol (Estrace)  Stop taking aspirin, Ibuprofen, Aleve, BC's, Goody's, Herbal medications, Fish Oil   Do not wear jewelry, make-up or nail polish.  Do not wear lotions, powders, or perfumes.  You may wear deodorant.  Do not shave 48 hours prior to surgery.  Men may shave face and neck.  Do not bring valuables to the hospital.  [facility] is not responsible for any belongings or valuables.  Contacts, dentures or bridgework may not be worn into surgery.  Leave your suitcase in the car.  After surgery it may be brought to your room.  For patients admitted to the hospital, discharge time will be determined by your treatment team.  Patients discharged the day of surgery will not be allowed to drive home.   Special instructions: [facility] - Preparing for Surgery  Before surgery, you can play an important role.  Because skin is not sterile, your skin needs to be as free of germs as possible.  You can reduce the number of germs on  you skin by washing with CHG (chlorahexidine gluconate) soap before surgery.  CHG is an antiseptic cleaner which kills germs and bonds with the skin to continue killing germs even after washing.  Please DO NOT use if you have an allergy to CHG or antibacterial soaps.  If your skin becomes reddened/irritated stop using the CHG and inform your nurse when you arrive at Short Stay.  Do not shave (including legs and underarms) for at least 48 hours prior to the first CHG shower.  You may shave your face.  Please follow these instructions carefully:   1.  Shower with CHG Soap the night before surgery and the  morning of Surgery.  2.  If you choose to wash your hair, wash your hair first as usual with your    normal shampoo.  3.  After you shampoo, rinse your hair and body thoroughly to remove the  Shampoo.  4.  Use CHG as you would any other liquid soap.  You can apply chg directly   to the skin and wash gently with scrungie or a clean washcloth.  5.  Apply the CHG Soap to your body ONLY FROM THE NECK DOWN.   Do not use on open wounds or open sores.  Avoid contact with your eyes,   ears, mouth and genitals (private parts).  Wash genitals (private parts)   with your normal soap.  6.  Wash thoroughly, paying special attention to the area where your surgery  will be performed.  7.  Thoroughly rinse your body with warm water from the neck down.  8.  DO NOT shower/wash with your normal soap after using and rinsing off the CHG Soap.  9.  Pat yourself dry with a clean towel.            10.  Wear clean pajamas.            11.  Place clean sheets on your bed the night of your first shower and do not  sleep with pets.  Day of Surgery  Do not apply any lotions/deoderants the morning of surgery.  Please wear clean clothes to the hospital/surgery center.      Please read over the following fact sheets that you were given. Pain Booklet, Coughing and Deep Breathing and Surgical Site Infection  Prevention

## 2015-04-22 NOTE — Progress Notes (Signed)
PCP is Dr Redge Gainer Denies ever seeing a cardiologist. Echo noted in epic from 11-25-14 States she had a  Stress test in 2002. Pt reports she was hard to wake up after her hysterectomy, but has had surgery since without difficulty. Pt states she did not want to have a cxr today because she just has an MRI on Oct of this year. 2 view CXr noted in epic form 06-2014

## 2015-04-27 ENCOUNTER — Ambulatory Visit: Payer: Managed Care, Other (non HMO) | Admitting: Hematology and Oncology

## 2015-04-27 ENCOUNTER — Telehealth: Payer: Self-pay | Admitting: Hematology and Oncology

## 2015-04-27 NOTE — Telephone Encounter (Signed)
Patient called in to reschedule her appointments °

## 2015-04-29 MED ORDER — CEFAZOLIN SODIUM-DEXTROSE 2-3 GM-% IV SOLR
2.0000 g | INTRAVENOUS | Status: AC
  Administered 2015-04-30: 2 g via INTRAVENOUS
  Filled 2015-04-29: qty 50

## 2015-04-29 MED ORDER — CHLORHEXIDINE GLUCONATE 4 % EX LIQD
1.0000 | Freq: Once | CUTANEOUS | Status: DC
Start: 2015-04-30 — End: 2015-04-30

## 2015-04-29 NOTE — H&P (Signed)
Sherry Bender  Location: [facility] Patient #: [id number] DOB: Apr 09, 1951 Married / Language: English / Race: White Female      History of Present Illness   The patient is a 64 year old female who presents with breast cancer. She returns with her husband, following neoadjuvant chemotherapy, to discuss definitive surgery of her left breast cancer.  She is 64 years old and [facility] [place] [facility]. Lindi Adie, Dr. Valere Dross, and me on November 18, 2014. Dr. Terrance Mass is her PCP up in [place]. She felt a lump in her left breast for 3 months and said it was getting larger. Imaging studies showed a mass in the left breast at the 12 o'clock position 4 cm from the nipple. The mass and calcifications were approximate 5.5 cm but felt much larger. The breast was very dense. There was no axillary adenopathy. 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 Imaging studies showed a 6-7 cm band of calcifications. We offered to biopsy those at that time but she wanted to hold off on that babies felt that we could do that later. She has GERD, ongoing tobacco abuse, anxiety disorder with panic attacks, hypertension, history of cervical discectomy earlier issue. History laparoscopic cholecystectomy. Status post hysterectomy and BSO. Estrace was discontinued. Port-A-Cath was inserted. She has completed her neoadjuvant chemotherapy. End of treatment MRI shows a complete metab She has seen Dr. Lindi Adie. She states she is not going to receive any more chemotherapy. I will check with him about removal of the Port-A-Cath We had a long discussion about options for management. She states that she and her husband and discussed this numerous times that she wants a mastectomy and sentinel node biopsy. She is not sure whether or not she wants a reconstruction she has seen Dr. Harlow Mares.  Because of her smoking history he  has elected to delay reconstruction until she stopped smoking.  She will return to see him postmastectomy what she has successfully completed a smoking cessation program.We talked about doing further biopsies to define the extent of her disease. We talked about the extent of lumpectomy considering the large area and the small size of her breast. We both agreed that mastectomy would probably be the best treatment for her. Hopefully she will not need radiation therapy but she knows that is still a possibility. I discussed the indications, details, techniques, and numerous risk of mastectomy and sentinel node biopsy with her and her husband. They're aware of the risk of bleeding, infection, skin necrosis, seroma, arm swelling, arm numbness, shoulder disability, and other unforeseen problems. She understands these issues well. All of her questions are answered. She agrees with this plan. She is tentatively scheduled for her surgery on November 11, pending plastic surgery consultation.   Addendum Note Dr. Lindi Adie states that we may remove port.   Allergies  Codeine Sulfate *ANALGESICS - OPIOID*  Medication History  Dexilant (60MG  Capsule DR, Oral) Active. Estrace (1MG  Tablet, Oral eod) Active. HydroCHLOROthiazide (25MG  Tablet, Oral) Active. AZO Bladder Control/Go-Less (Oral) Active. Multi Vitamin Daily (Oral) Active. Potassium Chloride ER (10MEQ Capsule ER, Oral) Active. Medications Reconciled  Vitals   Weight: 109 lb Height: 67in Body Surface Area: 1.56 m Body Mass Index: 17.07 kg/m  Temp.: 97.52F  Pulse: 80 (Regular)  BP: 138/76 (Sitting, Left Arm, Standard)       Physical Exam General Mental Status-Alert. General Appearance-Not in acute distress. Build & Nutrition-Well nourished. Posture-Normal posture. Gait-Normal.  Head  and Neck Head-normocephalic, atraumatic with no lesions or palpable  masses. Trachea-midline. Thyroid Gland Characteristics - normal size and consistency and no palpable nodules.  Chest and Lung Exam Chest and lung exam reveals -on auscultation, normal breath sounds, no adventitious sounds and normal vocal resonance.  Breast Note: Left breast is lumpy and a little atrophic. Tiny needle biopsy scar left breast. Still has a large area of fullness at 12 o'clock position at least 7 cm. Mobile and not fixed the chest wall. Skin looks healthy. Nipple and areola complex looked fine. No axillary adenopathy. Right breast is softer but still lumpy. No dominant mass. No palpable axillary adenopathy or skin change.   Cardiovascular Cardiovascular examination reveals -normal heart sounds, regular rate and rhythm with no murmurs and femoral artery auscultation bilaterally reveals normal pulses, no bruits, no thrills.  Abdomen Inspection Inspection of the abdomen reveals - No Hernias. Palpation/Percussion Palpation and Percussion of the abdomen reveal - Soft, Non Tender, No Rigidity (guarding), No hepatosplenomegaly and No Palpable abdominal masses. Note: Soft and nontender. Scars from laparoscopic cholecystectomy and hysterectomy.   Neurologic Neurologic evaluation reveals -alert and oriented x 3 with no impairment of recent or remote memory, normal attention span and ability to concentrate, normal sensation and normal coordination.  Neuropsychiatric Note: She is anxious but will follow the conversation. Sometimes has a difficult time focusing on the conversation but is cooperative and seems to understand and comprehend the discussion quite well as does her husband.   Musculoskeletal Normal Exam - Bilateral-Upper Extremity Strength Normal and Lower Extremity Strength Normal.    Assessment & Plan  CANCER OF CENTRAL PORTION OF LEFT BREAST (C50.112) Story: Left breast Current Plans You are being scheduled for surgery - Our schedulers will call  you. Your MRI shows that the tumor in your left breast is not metabolically active. However, you still have a 6-7 cm span of calcifications that may represent in situ cancer. We have discussed different options for defining the extent of your disease. However, after extensive discussion, you have stated that you would like to proceed with left total mastectomy and sentinel node biopsy and removal of the Port-A-Cath. That surgery is tentatively scheduled for November 11. We will not do any further biopsies, since all of this will be removed with the mastectomy. At your request, you will be referred to a plastic surgeon to discuss whether they can place a tissue expander at the same time Hopefully you will not need radiation therapy after mastectomy. If you have positive lymph nodes, however, as might be recommended. We have discussed the indications, techniques, and numerous risk of mastectomy and sentinel node biopsy. Please read the printed information that we gave you.    HISTORY OF LAPAROSCOPIC CHOLECYSTECTOMY (Z90.49  HISTORY OF HYSTERECTOMY WITH OOPHORECTOMY (Z90.710)  TOBACCO ABUSE (Z72.0) Impression: Ongoing. I have strongly recommended since smoking cessation program  HISTORY OF FUSION OF CERVICAL SPINE (Z98.1) HYPERTENSION, BENIGN (I10) CHRONIC GERD (K21.9) ANXIETY (F41.9) Impression: History of panic attacks  Addendum Note Dr Lindi Adie states that we may remove port.     Edsel Petrin. Dalbert Batman, M.D., [facility] General and Minimally invasive Surgery Breast and Colorectal Surgery Office:   [phone] Pager:   [phone]

## 2015-04-30 ENCOUNTER — Encounter (HOSPITAL_COMMUNITY): Payer: Self-pay | Admitting: *Deleted

## 2015-04-30 ENCOUNTER — Encounter (HOSPITAL_COMMUNITY)
Admission: RE | Disposition: A | Discharge status: Home / Self Care | Payer: Self-pay | Source: Ambulatory Visit | Attending: General Surgery

## 2015-04-30 ENCOUNTER — Ambulatory Visit (HOSPITAL_COMMUNITY): Payer: Managed Care, Other (non HMO) | Admitting: Anesthesiology

## 2015-04-30 ENCOUNTER — Ambulatory Visit (HOSPITAL_COMMUNITY)
Admission: RE | Admit: 2015-04-30 | Discharge: 2015-04-30 | Disposition: A | Discharge status: Home / Self Care | Payer: Managed Care, Other (non HMO) | Source: Ambulatory Visit | Attending: General Surgery | Admitting: General Surgery

## 2015-04-30 ENCOUNTER — Ambulatory Visit (HOSPITAL_COMMUNITY)
Admission: RE | Admit: 2015-04-30 | Discharge: 2015-05-02 | Disposition: A | Discharge status: Home / Self Care | Payer: Managed Care, Other (non HMO) | Source: Ambulatory Visit | Attending: General Surgery | Admitting: General Surgery

## 2015-04-30 DIAGNOSIS — Z79899 Other long term (current) drug therapy: Secondary | ICD-10-CM | POA: Diagnosis not present

## 2015-04-30 DIAGNOSIS — C50212 Malignant neoplasm of upper-inner quadrant of left female breast: Secondary | ICD-10-CM | POA: Diagnosis present

## 2015-04-30 DIAGNOSIS — I10 Essential (primary) hypertension: Secondary | ICD-10-CM | POA: Diagnosis not present

## 2015-04-30 DIAGNOSIS — C50412 Malignant neoplasm of upper-outer quadrant of left female breast: Secondary | ICD-10-CM | POA: Insufficient documentation

## 2015-04-30 DIAGNOSIS — Z90710 Acquired absence of both cervix and uterus: Secondary | ICD-10-CM | POA: Insufficient documentation

## 2015-04-30 DIAGNOSIS — Z981 Arthrodesis status: Secondary | ICD-10-CM | POA: Insufficient documentation

## 2015-04-30 DIAGNOSIS — F419 Anxiety disorder, unspecified: Secondary | ICD-10-CM | POA: Insufficient documentation

## 2015-04-30 DIAGNOSIS — C50112 Malignant neoplasm of central portion of left female breast: Secondary | ICD-10-CM

## 2015-04-30 DIAGNOSIS — K219 Gastro-esophageal reflux disease without esophagitis: Secondary | ICD-10-CM | POA: Diagnosis not present

## 2015-04-30 DIAGNOSIS — R338 Other retention of urine: Secondary | ICD-10-CM | POA: Diagnosis not present

## 2015-04-30 DIAGNOSIS — Z9221 Personal history of antineoplastic chemotherapy: Secondary | ICD-10-CM | POA: Insufficient documentation

## 2015-04-30 HISTORY — PX: PORT-A-CATH REMOVAL: SHX5289

## 2015-04-30 HISTORY — PX: COMPLETE MASTECTOMY W/ SENTINEL NODE BIOPSY: SHX1383

## 2015-04-30 HISTORY — PX: SIMPLE MASTECTOMY WITH AXILLARY SENTINEL NODE BIOPSY: SHX6098

## 2015-04-30 LAB — CBC
HCT: 30.9 % — ABNORMAL LOW (ref 36.0–46.0)
Hemoglobin: 9.8 g/dL — ABNORMAL LOW (ref 12.0–15.0)
MCH: 27.2 pg (ref 26.0–34.0)
MCHC: 31.7 g/dL (ref 30.0–36.0)
MCV: 85.8 fL (ref 78.0–100.0)
Platelets: 166 10*3/uL (ref 150–400)
RBC: 3.6 MIL/uL — ABNORMAL LOW (ref 3.87–5.11)
RDW: 16.3 % — ABNORMAL HIGH (ref 11.5–15.5)
WBC: 7.7 10*3/uL (ref 4.0–10.5)

## 2015-04-30 LAB — CREATININE, SERUM
Creatinine, Ser: 0.41 mg/dL — ABNORMAL LOW (ref 0.44–1.00)
GFR calc Af Amer: >60 mL/min (ref >60)
GFR calc non Af Amer: >60 mL/min (ref >60)

## 2015-04-30 SURGERY — SIMPLE MASTECTOMY WITH AXILLARY SENTINEL NODE BIOPSY
Anesthesia: General | Site: Breast | Laterality: Right

## 2015-04-30 MED ORDER — FENTANYL CITRATE (PF) 250 MCG/5ML IJ SOLN
INTRAMUSCULAR | Status: AC
  Filled 2015-04-30: qty 5

## 2015-04-30 MED ORDER — LIDOCAINE HCL (CARDIAC) 20 MG/ML IV SOLN
INTRAVENOUS | Status: AC
  Filled 2015-04-30: qty 5

## 2015-04-30 MED ORDER — PROPOFOL 10 MG/ML IV BOLUS
INTRAVENOUS | Status: AC
  Filled 2015-04-30: qty 20

## 2015-04-30 MED ORDER — SODIUM CHLORIDE 0.9 % IJ SOLN
INTRAMUSCULAR | Status: AC
  Filled 2015-04-30: qty 10

## 2015-04-30 MED ORDER — PHENYLEPHRINE HCL 10 MG/ML IJ SOLN
10.0000 mg | INTRAVENOUS | Status: DC | PRN
  Administered 2015-04-30: 20 ug/min via INTRAVENOUS

## 2015-04-30 MED ORDER — HYDROMORPHONE HCL 1 MG/ML IJ SOLN
0.5000 mg | INTRAMUSCULAR | Status: DC | PRN
  Administered 2015-04-30 – 2015-05-02 (×7): 1 mg via INTRAVENOUS
  Filled 2015-04-30 (×7): qty 1

## 2015-04-30 MED ORDER — ARTIFICIAL TEARS OP OINT
TOPICAL_OINTMENT | OPHTHALMIC | Status: AC
  Filled 2015-04-30: qty 3.5

## 2015-04-30 MED ORDER — PHENYLEPHRINE 40 MCG/ML (10ML) SYRINGE FOR IV PUSH (FOR BLOOD PRESSURE SUPPORT)
PREFILLED_SYRINGE | INTRAVENOUS | Status: AC
  Filled 2015-04-30: qty 10

## 2015-04-30 MED ORDER — SODIUM CHLORIDE 0.9 % IJ SOLN
INTRAMUSCULAR | Status: DC | PRN
  Administered 2015-04-30: 2 mL via INTRAMUSCULAR

## 2015-04-30 MED ORDER — MIDAZOLAM HCL 5 MG/5ML IJ SOLN
INTRAMUSCULAR | Status: DC | PRN
Start: 2015-04-30 — End: 2015-04-30
  Administered 2015-04-30: 2 mg via INTRAVENOUS

## 2015-04-30 MED ORDER — LIDOCAINE-EPINEPHRINE (PF) 1 %-1:200000 IJ SOLN
INTRAMUSCULAR | Status: AC
Start: 2015-04-30 — End: 2015-04-30
  Filled 2015-04-30: qty 30

## 2015-04-30 MED ORDER — LACTATED RINGERS IV SOLN
INTRAVENOUS | Status: DC | PRN
  Administered 2015-04-30: 07:00:00 via INTRAVENOUS

## 2015-04-30 MED ORDER — POTASSIUM CHLORIDE CRYS ER 10 MEQ PO TBCR
10.0000 meq | EXTENDED_RELEASE_TABLET | Freq: Every day | ORAL | Status: DC
  Administered 2015-04-30 – 2015-05-01 (×2): 10 meq via ORAL
  Filled 2015-04-30 (×3): qty 1

## 2015-04-30 MED ORDER — GLYCOPYRROLATE 0.2 MG/ML IJ SOLN
INTRAMUSCULAR | Status: AC
  Filled 2015-04-30: qty 3

## 2015-04-30 MED ORDER — PROMETHAZINE HCL 25 MG/ML IJ SOLN: 6.2500 mg | INTRAMUSCULAR | Status: DC | PRN

## 2015-04-30 MED ORDER — METHYLENE BLUE 1 % INJ SOLN
INTRAMUSCULAR | Status: AC
  Filled 2015-04-30: qty 10

## 2015-04-30 MED ORDER — MIDAZOLAM HCL 2 MG/2ML IJ SOLN
INTRAMUSCULAR | Status: AC
  Filled 2015-04-30: qty 4

## 2015-04-30 MED ORDER — SUCCINYLCHOLINE CHLORIDE 20 MG/ML IJ SOLN
INTRAMUSCULAR | Status: AC
  Filled 2015-04-30: qty 1

## 2015-04-30 MED ORDER — LIDOCAINE HCL (CARDIAC) 20 MG/ML IV SOLN
INTRAVENOUS | Status: DC | PRN
  Administered 2015-04-30: 60 mg via INTRAVENOUS

## 2015-04-30 MED ORDER — DIPHENHYDRAMINE HCL 12.5 MG/5ML PO ELIX: 12.5000 mg | ORAL_SOLUTION | Freq: Four times a day (QID) | ORAL | Status: DC | PRN

## 2015-04-30 MED ORDER — LIDOCAINE-EPINEPHRINE (PF) 1 %-1:200000 IJ SOLN
INTRAMUSCULAR | Status: DC | PRN
  Administered 2015-04-30: 3 mL

## 2015-04-30 MED ORDER — NEOSTIGMINE METHYLSULFATE 10 MG/10ML IV SOLN
INTRAVENOUS | Status: DC | PRN
  Administered 2015-04-30: 3 mg via INTRAVENOUS

## 2015-04-30 MED ORDER — ROCURONIUM BROMIDE 50 MG/5ML IV SOLN
INTRAVENOUS | Status: AC
  Filled 2015-04-30: qty 1

## 2015-04-30 MED ORDER — POTASSIUM CHLORIDE IN NACL 20-0.9 MEQ/L-% IV SOLN
INTRAVENOUS | Status: DC
  Administered 2015-04-30 – 2015-05-02 (×5): via INTRAVENOUS
  Filled 2015-04-30 (×5): qty 1000

## 2015-04-30 MED ORDER — METHOCARBAMOL 500 MG PO TABS
500.0000 mg | ORAL_TABLET | Freq: Four times a day (QID) | ORAL | Status: DC | PRN
  Administered 2015-05-01 – 2015-05-02 (×3): 500 mg via ORAL
  Filled 2015-04-30 (×4): qty 1

## 2015-04-30 MED ORDER — PROPOFOL 10 MG/ML IV BOLUS
INTRAVENOUS | Status: AC
Start: 2015-04-30 — End: 2015-04-30
  Filled 2015-04-30: qty 20

## 2015-04-30 MED ORDER — PROPOFOL 10 MG/ML IV BOLUS
INTRAVENOUS | Status: DC | PRN
  Administered 2015-04-30: 150 mg via INTRAVENOUS
  Administered 2015-04-30: 50 mg via INTRAVENOUS

## 2015-04-30 MED ORDER — EPHEDRINE SULFATE 50 MG/ML IJ SOLN
INTRAMUSCULAR | Status: DC | PRN
  Administered 2015-04-30 (×2): 10 mg via INTRAVENOUS

## 2015-04-30 MED ORDER — DIPHENHYDRAMINE HCL 50 MG/ML IJ SOLN: 12.5000 mg | Freq: Four times a day (QID) | INTRAMUSCULAR | Status: DC | PRN

## 2015-04-30 MED ORDER — POLYETHYLENE GLYCOL 3350 17 G PO PACK
17.0000 g | PACK | Freq: Every day | ORAL | Status: DC | PRN
  Administered 2015-04-30: 17 g via ORAL

## 2015-04-30 MED ORDER — PANTOPRAZOLE SODIUM 40 MG PO TBEC
40.0000 mg | DELAYED_RELEASE_TABLET | Freq: Every day | ORAL | Status: DC
  Administered 2015-05-01: 40 mg via ORAL
  Filled 2015-04-30 (×2): qty 1

## 2015-04-30 MED ORDER — EPHEDRINE SULFATE 50 MG/ML IJ SOLN
INTRAMUSCULAR | Status: AC
Start: 2015-04-30 — End: 2015-04-30
  Filled 2015-04-30: qty 1

## 2015-04-30 MED ORDER — FENTANYL CITRATE (PF) 100 MCG/2ML IJ SOLN
INTRAMUSCULAR | Status: AC
  Filled 2015-04-30: qty 2

## 2015-04-30 MED ORDER — GLYCOPYRROLATE 0.2 MG/ML IJ SOLN
INTRAMUSCULAR | Status: DC | PRN
  Administered 2015-04-30: 0.2 mg via INTRAVENOUS

## 2015-04-30 MED ORDER — ROCURONIUM BROMIDE 100 MG/10ML IV SOLN
INTRAVENOUS | Status: DC | PRN
  Administered 2015-04-30: 25 mg via INTRAVENOUS

## 2015-04-30 MED ORDER — ONDANSETRON HCL 4 MG/2ML IJ SOLN
INTRAMUSCULAR | Status: DC | PRN
  Administered 2015-04-30: 4 mg via INTRAVENOUS

## 2015-04-30 MED ORDER — TECHNETIUM TC 99M SULFUR COLLOID FILTERED
1.0000 | Freq: Once | INTRAVENOUS | Status: AC | PRN
  Administered 2015-04-30: 1 via INTRADERMAL

## 2015-04-30 MED ORDER — HYDROCHLOROTHIAZIDE 25 MG PO TABS
25.0000 mg | ORAL_TABLET | Freq: Every day | ORAL | Status: DC
  Administered 2015-05-01: 25 mg via ORAL
  Filled 2015-04-30 (×2): qty 1

## 2015-04-30 MED ORDER — ONDANSETRON HCL 4 MG/2ML IJ SOLN
4.0000 mg | INTRAMUSCULAR | Status: DC | PRN
  Administered 2015-04-30 – 2015-05-01 (×2): 4 mg via INTRAVENOUS
  Filled 2015-04-30 (×2): qty 2

## 2015-04-30 MED ORDER — BUPIVACAINE-EPINEPHRINE (PF) 0.5% -1:200000 IJ SOLN
INTRAMUSCULAR | Status: DC | PRN
  Administered 2015-04-30: 20 mL via PERINEURAL

## 2015-04-30 MED ORDER — BISACODYL 5 MG PO TBEC: 5.0000 mg | DELAYED_RELEASE_TABLET | Freq: Every day | ORAL | Status: DC | PRN

## 2015-04-30 MED ORDER — FENTANYL CITRATE (PF) 100 MCG/2ML IJ SOLN
25.0000 ug | INTRAMUSCULAR | Status: DC | PRN
  Administered 2015-04-30: 25 ug via INTRAVENOUS
  Administered 2015-04-30: 0.5 ug via INTRAVENOUS
  Administered 2015-04-30 (×2): 25 ug via INTRAVENOUS

## 2015-04-30 MED ORDER — POLYVINYL ALCOHOL 1.4 % OP SOLN
2.0000 [drp] | OPHTHALMIC | Status: DC | PRN
  Filled 2015-04-30: qty 15

## 2015-04-30 MED ORDER — 0.9 % SODIUM CHLORIDE (POUR BTL) OPTIME
TOPICAL | Status: DC | PRN
  Administered 2015-04-30: 1000 mL

## 2015-04-30 MED ORDER — OXYCODONE HCL 5 MG PO TABS
5.0000 mg | ORAL_TABLET | ORAL | Status: DC | PRN
  Administered 2015-05-01 (×2): 5 mg via ORAL
  Administered 2015-05-02: 10 mg via ORAL
  Filled 2015-04-30: qty 1
  Filled 2015-04-30: qty 2
  Filled 2015-04-30: qty 1

## 2015-04-30 MED ORDER — BOOST / RESOURCE BREEZE PO LIQD
1.0000 | Freq: Three times a day (TID) | ORAL | Status: DC
  Administered 2015-05-01: 1 via ORAL

## 2015-04-30 MED ORDER — ONDANSETRON HCL 4 MG/2ML IJ SOLN
4.0000 mg | Freq: Four times a day (QID) | INTRAMUSCULAR | Status: DC | PRN
  Administered 2015-04-30: 4 mg via INTRAVENOUS
  Filled 2015-04-30: qty 2

## 2015-04-30 MED ORDER — ONDANSETRON 4 MG PO TBDP: 4.0000 mg | ORAL_TABLET | Freq: Four times a day (QID) | ORAL | Status: DC | PRN

## 2015-04-30 MED ORDER — ONDANSETRON HCL 4 MG/2ML IJ SOLN
INTRAMUSCULAR | Status: AC
  Filled 2015-04-30: qty 2

## 2015-04-30 MED ORDER — NEOSTIGMINE METHYLSULFATE 10 MG/10ML IV SOLN
INTRAVENOUS | Status: AC
Start: 2015-04-30 — End: 2015-04-30
  Filled 2015-04-30: qty 1

## 2015-04-30 MED ORDER — PHENYLEPHRINE HCL 10 MG/ML IJ SOLN
INTRAMUSCULAR | Status: DC | PRN
  Administered 2015-04-30 (×4): 40 ug via INTRAVENOUS

## 2015-04-30 MED ORDER — ENOXAPARIN SODIUM 40 MG/0.4ML ~~LOC~~ SOLN
40.0000 mg | SUBCUTANEOUS | Status: DC
  Administered 2015-05-01: 40 mg via SUBCUTANEOUS
  Filled 2015-04-30 (×2): qty 0.4

## 2015-04-30 MED ORDER — FENTANYL CITRATE (PF) 100 MCG/2ML IJ SOLN
INTRAMUSCULAR | Status: DC | PRN
  Administered 2015-04-30: 100 ug via INTRAVENOUS
  Administered 2015-04-30 (×3): 50 ug via INTRAVENOUS

## 2015-04-30 SURGICAL SUPPLY — 54 items
APPLIER CLIP 9.375 MED OPEN (MISCELLANEOUS) ×3
BINDER BREAST LRG (GAUZE/BANDAGES/DRESSINGS) ×3 IMPLANT
BINDER BREAST XLRG (GAUZE/BANDAGES/DRESSINGS) IMPLANT
BIOPATCH RED 1 DISK 7.0 (GAUZE/BANDAGES/DRESSINGS) ×3 IMPLANT
CANISTER SUCTION 2500CC (MISCELLANEOUS) ×6 IMPLANT
CHLORAPREP W/TINT 10.5 ML (MISCELLANEOUS) ×3 IMPLANT
CHLORAPREP W/TINT 26ML (MISCELLANEOUS) ×3 IMPLANT
CLIP APPLIE 9.375 MED OPEN (MISCELLANEOUS) ×2 IMPLANT
CONT SPEC 4OZ CLIKSEAL STRL BL (MISCELLANEOUS) ×9 IMPLANT
COVER PROBE W GEL 5X96 (DRAPES) ×3 IMPLANT
COVER SURGICAL LIGHT HANDLE (MISCELLANEOUS) ×3 IMPLANT
DERMABOND ADVANCED (GAUZE/BANDAGES/DRESSINGS) ×1
DERMABOND ADVANCED .7 DNX12 (GAUZE/BANDAGES/DRESSINGS) ×2 IMPLANT
DEVICE DISSECT PLASMABLAD 3.0S (MISCELLANEOUS) ×2 IMPLANT
DRAIN CHANNEL 19F RND (DRAIN) ×3 IMPLANT
DRAPE CHEST BREAST 15X10 FENES (DRAPES) ×3 IMPLANT
DRAPE PED LAPAROTOMY (DRAPES) ×3 IMPLANT
DRAPE UTILITY XL STRL (DRAPES) ×6 IMPLANT
DRSG PAD ABDOMINAL 8X10 ST (GAUZE/BANDAGES/DRESSINGS) ×6 IMPLANT
DRSG TEGADERM 4X4.75 (GAUZE/BANDAGES/DRESSINGS) ×3 IMPLANT
ELECT BLADE 4.0 EZ CLEAN MEGAD (MISCELLANEOUS) ×3
ELECT CAUTERY BLADE 6.4 (BLADE) ×6 IMPLANT
ELECT REM PT RETURN 9FT ADLT (ELECTROSURGICAL) ×3
ELECTRODE BLDE 4.0 EZ CLN MEGD (MISCELLANEOUS) ×2 IMPLANT
ELECTRODE REM PT RTRN 9FT ADLT (ELECTROSURGICAL) ×2 IMPLANT
EVACUATOR SILICONE 100CC (DRAIN) ×3 IMPLANT
GAUZE SPONGE 4X4 12PLY STRL (GAUZE/BANDAGES/DRESSINGS) ×3 IMPLANT
GAUZE SPONGE 4X4 16PLY XRAY LF (GAUZE/BANDAGES/DRESSINGS) ×3 IMPLANT
GLOVE BIO SURGEON STRL SZ7.5 (GLOVE) ×3 IMPLANT
GLOVE EUDERMIC 7 POWDERFREE (GLOVE) ×3 IMPLANT
GOWN STRL REUS W/ TWL LRG LVL3 (GOWN DISPOSABLE) ×2 IMPLANT
GOWN STRL REUS W/ TWL XL LVL3 (GOWN DISPOSABLE) ×2 IMPLANT
GOWN STRL REUS W/TWL LRG LVL3 (GOWN DISPOSABLE) ×1
GOWN STRL REUS W/TWL XL LVL3 (GOWN DISPOSABLE) ×1
KIT BASIN OR (CUSTOM PROCEDURE TRAY) ×3 IMPLANT
KIT ROOM TURNOVER OR (KITS) ×3 IMPLANT
NEEDLE 18GX1X1/2 (RX/OR ONLY) (NEEDLE) IMPLANT
NEEDLE HYPO 25GX1X1/2 BEV (NEEDLE) ×3 IMPLANT
NS IRRIG 1000ML POUR BTL (IV SOLUTION) ×3 IMPLANT
PACK GENERAL/GYN (CUSTOM PROCEDURE TRAY) ×3 IMPLANT
PACK SURGICAL SETUP 50X90 (CUSTOM PROCEDURE TRAY) ×3 IMPLANT
PAD ARMBOARD 7.5X6 YLW CONV (MISCELLANEOUS) ×3 IMPLANT
PENCIL BUTTON HOLSTER BLD 10FT (ELECTRODE) ×3 IMPLANT
PLASMABLADE 3.0S (MISCELLANEOUS) ×3
SPECIMEN JAR X LARGE (MISCELLANEOUS) ×3 IMPLANT
SUT ETHILON 3 0 FSL (SUTURE) ×3 IMPLANT
SUT MNCRL AB 4-0 PS2 18 (SUTURE) ×6 IMPLANT
SUT SILK 2 0 FS (SUTURE) ×3 IMPLANT
SUT SILK 2 0 SH CR/8 (SUTURE) ×3 IMPLANT
SUT VIC AB 3-0 SH 18 (SUTURE) ×6 IMPLANT
SYR CONTROL 10ML LL (SYRINGE) ×3 IMPLANT
TOWEL OR 17X24 6PK STRL BLUE (TOWEL DISPOSABLE) ×3 IMPLANT
TOWEL OR 17X26 10 PK STRL BLUE (TOWEL DISPOSABLE) ×3 IMPLANT
TUBE CONNECTING 12X1/4 (SUCTIONS) ×3 IMPLANT

## 2015-04-30 NOTE — OR Nursing (Signed)
Port-A-Cath removed per Dr. Dalbert Batman. Intact.

## 2015-04-30 NOTE — Op Note (Signed)
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                                      Status post neoadjuvant chemotherapy  Post op Diagnosis:    Same  Procedure:                 Remove Port-A-Cath                                      Inject blue dye left breast                                      Left total mastectomy                                      Left axillary sentinel node biopsy  Surgeon:                     Edsel Petrin. Dalbert Batman, M.D., FACS  Assistant:                      Autumn Messing, M.D.  Operative Indications:   The patient is a 64 year old female who presents for definitive surgery for her left breast cancer following neoadjuvant chemotherapy. She was initially seen at the [facility] by Dr. Lindi Adie, Dr. Valere Dross, and me on November 18, 2014. Dr. Terrance Mass is her PCP up in [place]. She felt a lump in her left breast for 3 months and said it was getting larger. Imaging studies showed a mass in the left breast at the 12 o'clock position 4 cm from the nipple. The mass and calcifications were approximate 5.5 cm but felt much larger. The breast was very dense. There was no axillary adenopathy. 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 Imaging studies showed a 6-7 cm band of calcifications. We offered to biopsy those at that time but she wanted to hold off on that babies felt that we could do that later. She has GERD, ongoing tobacco abuse, anxiety disorder with panic attacks, hypertension, history of cervical discectomy. History laparoscopic cholecystectomy. Status post hysterectomy and BSO. Estrace was discontinued. Port-A-Cath was inserted. She has completed her neoadjuvant chemotherapy. End of treatment MRI shows a complete metabolic response.  Dr. Lindi Adie states we may remove the  Port-A-Cath. We had a long discussion about options for management. She states that she and her husband and discussed this numerous times that she wants a mastectomy and sentinel node biopsy. She is not sure whether or not she wants a reconstruction she has seen Dr. Harlow Mares. Because of her smoking history he has elected to delay reconstruction until she stopped smoking. She will return to see him postmastectomy when she has successfully completed a smoking cessation program.We talked about doing further biopsies to define the extent of her disease. We talked about the extent of lumpectomy considering  the large area and the small size of her breast. We both agreed that mastectomy would probably be the best treatment for her. Hopefully she will not need radiation therapy but she knows that is still a possibility.   Operative Findings:       The port was removed uneventfully and the catheter and port were intact.  The breast tissues felt a little dense in the superior half but there was no gross cancer and the resection seem to come off the chest wall cleanly.  I found 3 sentinel lymph nodes, but they did not appear pathologically enlarged.  Procedure in Detail:          Following the induction of general endotracheal anesthesia a surgical timeout was performed.  Following alcohol prep I injected 5 mL of blue dye into the left breast subareolar area.  This is methylene blue mixed with saline.  We massaged the breast for a few minutes.  The right and left chest walls, left breast, and left axilla were then prepped and draped in a sterile fashion.  Intravenous antibiotics were given.  0.5% Marcaine with epinephrine was used as local infiltration around the Port-A-Cath site.       The port was in the right infraclavicular area.  I made incision through the previous scar and dissected the port away from the surrounding capsule.  All 3 Prolene sutures were cut and removed.  The port and  catheter were removed intact.  No bleeding.  Subcutaneous tissue closed with 3-0 Vicryl sutures and skin closed with running subcutaneous taken of 4-0 Monocryl and Dermabond.      I then turned my attention to the left breast.  Using a marking pen I planned my incision as a transverse ellipse.  The incision was made with a knife.  Skin flaps were raised superiorly to the infraclavicular area, medially to the parasternal area, in fairly to the anterior rectus sheath and laterally to the latissimus dorsi muscle.  I used the plasma blade for this.  A perforating vessel was clipped and cauterized.  A couple of axillary vessels were clipped and divided.  I marked the lateral skin margin with a silk suture.  The breast was dissected off of the chest wall with electrocautery.  This was sent to pathology.  Using the neoprobe I dissected up into the axilla and found 3 sentinel lymph nodes.  These were obviously hot and blue but did not seem enlarged.  After these 3 were removed there was no more radioactivity.      The wound was irrigated with saline.  Hemostasis was excellent.  A single 19 French drain was placed across the skin flaps and up into the left axillary area and brought out through a eparate stab incision inferolaterally, sutured the skin and connected to a suction bulb.  The incision was closed transversely with in 2 layers, 3-0 Vicryl interrupted sutures and running 4-0 Monocryl and Dermabond.  Dry bandages and a breast binder were placed.  The patient tolerated the procedure well was taken to PACU in stable condition.  EBL 50 mL.  Counts correct.  Complications none.     Edsel Petrin. Dalbert Batman, M.D., FACS General and Minimally Invasive Surgery Breast and Colorectal Surgery  04/30/2015 9:29 AM

## 2015-04-30 NOTE — Interval H&P Note (Signed)
History and Physical Interval Note:  04/30/2015 6:28 AM  Sherry Bender  has presented today for surgery, with the diagnosis of left breast cancer w DCIS  The various methods of treatment have been discussed with the patient and family. After consideration of risks, benefits and other options for treatment, the patient has consented to  Procedure(s): LEFT TOTAL  MASTECTOMY WITH LEFT AXILLARY SENTINEL NODE BIOPSY (Left) REMOVAL PORT-A-CATH (N/A) as a surgical intervention .  The patient's history has been reviewed, patient examined, no change in status, stable for surgery.  I have reviewed the patient's chart and labs.  Questions were answered to the patient's satisfaction.     [name]

## 2015-04-30 NOTE — Anesthesia Postprocedure Evaluation (Signed)
  Anesthesia Post-op Note  Patient: Sherry Bender  Procedure(s) Performed: Procedure(s): LEFT TOTAL  MASTECTOMY WITH LEFT AXILLARY SENTINEL NODE BIOPSY (Left) REMOVAL PORT-A-CATH (Right)  Patient Location: PACU  Anesthesia Type:General  Level of Consciousness: awake  Airway and Oxygen Therapy: Patient Spontanous Breathing  Post-op Pain: mild  Post-op Assessment: Post-op Vital signs reviewed              Post-op Vital Signs: Reviewed  Last Vitals:  Filed Vitals:   04/30/15 0930  BP:   Pulse:   Temp: 36.8 C  Resp:     Complications: No apparent anesthesia complications

## 2015-04-30 NOTE — Transfer of Care (Signed)
Immediate Anesthesia Transfer of Care Note  Patient: Sherry Bender  Procedure(s) Performed: Procedure(s): LEFT TOTAL  MASTECTOMY WITH LEFT AXILLARY SENTINEL NODE BIOPSY (Left) REMOVAL PORT-A-CATH (Right)  Patient Location: PACU  Anesthesia Type:General and Regional  Level of Consciousness: awake, alert , oriented and sedated  Airway & Oxygen Therapy: Patient Spontanous Breathing and Patient connected to nasal cannula oxygen  Post-op Assessment: Report given to RN, Post -op Vital signs reviewed and stable and Patient moving all extremities  Post vital signs: Reviewed and stable  Last Vitals:  Filed Vitals:   04/30/15 0608  BP: 134/77  Pulse: 86  Temp: 36.4 C  Resp: 18    Complications: No apparent anesthesia complications

## 2015-04-30 NOTE — Anesthesia Postprocedure Evaluation (Signed)
  Anesthesia Post-op Note  Patient: Sherry Bender  Procedure(s) Performed: Procedure(s): LEFT TOTAL  MASTECTOMY WITH LEFT AXILLARY SENTINEL NODE BIOPSY (Left) REMOVAL PORT-A-CATH (Right)  Patient Location: PACU  Anesthesia Type:General  Level of Consciousness: awake  Airway and Oxygen Therapy: Patient Spontanous Breathing  Post-op Pain: mild  Post-op Assessment: Post-op Vital signs reviewed              Post-op Vital Signs: Reviewed  Last Vitals:  Filed Vitals:   04/30/15 1121  BP: 124/55  Pulse: 72  Temp: 36.7 C  Resp: 20    Complications: No apparent anesthesia complications

## 2015-04-30 NOTE — Anesthesia Procedure Notes (Addendum)
Anesthesia Regional Block:  Pectoralis block  Pre-Anesthetic Checklist: ,, timeout performed, Correct Patient, Correct Site, Correct Laterality, Correct Procedure, Correct Position, site marked, Risks and benefits discussed,  Surgical consent,  Pre-op evaluation,  At surgeon's request and post-op pain management  Laterality: Left  Prep: chloraprep       Needles:  Injection technique: Single-shot  Needle Type: Echogenic Needle     Needle Length: 9cm 9 cm Needle Gauge: 21 and 21 G    Additional Needles:  Procedures: ultrasound guided (picture in chart) Pectoralis block Narrative:  Start time: 04/30/2015 7:01 AM End time: 04/30/2015 7:11 AM Injection made incrementally with aspirations every 5 mL.  Performed by: Personally  Anesthesiologist: HODIERNE, ADAM  Additional Notes: Pt tolerated the procedure well.   Procedure Name: Intubation Date/Time: 04/30/2015 7:46 AM Performed by: Scheryl Darter Pre-anesthesia Checklist: Patient identified, Emergency Drugs available, Suction available, Patient being monitored and Timeout performed Patient Re-evaluated:Patient Re-evaluated prior to inductionOxygen Delivery Method: Circle system utilized Preoxygenation: Pre-oxygenation with 100% oxygen Intubation Type: IV induction Ventilation: Mask ventilation with difficulty Grade View: Grade II Tube type: Oral Tube size: 7.0 mm Number of attempts: 1 Airway Equipment and Method: Stylet and Video-laryngoscopy Placement Confirmation: ETT inserted through vocal cords under direct vision,  positive ETCO2 and breath sounds checked- equal and bilateral Secured at: 22 cm Tube secured with: Tape Dental Injury: Teeth and Oropharynx as per pre-operative assessment  Difficulty Due To: Difficulty was anticipated, Difficult Airway- due to anterior larynx and Difficult Airway- due to reduced neck mobility Comments: All crowns intact as pre-op

## 2015-04-30 NOTE — Anesthesia Preprocedure Evaluation (Addendum)
Anesthesia Evaluation  Patient identified by MRN, date of birth, ID band Patient awake    Reviewed: Allergy & Precautions, NPO status   Airway Mallampati: II       Dental   Pulmonary Current Smoker,    breath sounds clear to auscultation       Cardiovascular hypertension,  Rhythm:Regular Rate:Normal     Neuro/Psych    GI/Hepatic Neg liver ROS, GERD  ,  Endo/Other  negative endocrine ROS  Renal/GU negative Renal ROS     Musculoskeletal   Abdominal   Peds  Hematology negative hematology ROS (+)   Anesthesia Other Findings   Reproductive/Obstetrics                            Anesthesia Physical Anesthesia Plan  ASA: III  Anesthesia Plan: General   Post-op Pain Management:    Induction: Intravenous  Airway Management Planned: Oral ETT  Additional Equipment:   Intra-op Plan:   Post-operative Plan: Extubation in OR  Informed Consent: I have reviewed the patients History and Physical, chart, labs and discussed the procedure including the risks, benefits and alternatives for the proposed anesthesia with the patient or authorized representative who has indicated his/her understanding and acceptance.   Dental advisory given  Plan Discussed with: Anesthesiologist and CRNA  Anesthesia Plan Comments:         Anesthesia Quick Evaluation

## 2015-05-01 DIAGNOSIS — C50412 Malignant neoplasm of upper-outer quadrant of left female breast: Secondary | ICD-10-CM | POA: Diagnosis not present

## 2015-05-01 MED ORDER — IBUPROFEN 400 MG PO TABS
400.0000 mg | ORAL_TABLET | ORAL | Status: DC | PRN
  Administered 2015-05-01 – 2015-05-02 (×4): 400 mg via ORAL
  Filled 2015-05-01 (×4): qty 1

## 2015-05-01 NOTE — Progress Notes (Signed)
Initial Nutrition Assessment  DOCUMENTATION CODES:   Severe malnutrition in context of chronic illness, Underweight  INTERVENTION:  Continue Boost Breeze po TID, each supplement provides 250 kcal and 9 grams of protein.  Encourage adequate PO intake.   NUTRITION DIAGNOSIS:   Malnutrition related to chronic illness as evidenced by percent weight loss, severe depletion of body fat.  GOAL:   Patient will meet greater than or equal to 90% of their needs  MONITOR:   PO intake, Supplement acceptance, Diet advancement, Weight trends, Labs, I & O's  REASON FOR ASSESSMENT:   Malnutrition Screening Tool    ASSESSMENT:   64 year old female who presents with breast cancer. She returns with her husband, following neoadjuvant chemotherapy, to discuss definitive surgery of her left breast cancer. She has GERD, ongoing tobacco abuse, anxiety disorder with panic attacks, hypertension, history of cervical discectomy earlier issue. History laparoscopic cholecystectomy. Status post hysterectomy and BSO.  Procedure(11/11) LEFT TOTAL MASTECTOMY WITH LEFT AXILLARY SENTINEL NODE BIOPSY (Left) REMOVAL PORT-A-CATH (Right)  Pt reports having a lack of appetite due to nausea/vomiting which has been ongoing since admission. Pt reports she was eating fine PTA with consumption of at least 3 meals daily. Husband does report however food at meals have been small portions. No percent meal completion recorded, however pt reports anything she has been trying to consume has not been "staying down". Pt with weight loss. Per Epic weight records, pt with a 14% weight loss in 10 months. Pt reports she has been consuming Boost at home at least once daily. Pt was educated to continue supplementation of Boost at least 1-2 daily to aid in caloric and protein needs as well as in prevention of further weight loss. Pt currently has Boost Breeze ordered. RD to continue with current orders.  Nutrition-Focused physical exam  completed. Findings are severe fat depletion, moderate muscle depletion, and no edema.   Labs and medications reviewed.   Diet Order:  Diet clear liquid Room service appropriate?: Yes; Fluid consistency:: Thin  Skin:   (Incisions on chest and neck)  Last BM:  11/11  Height:   Ht Readings from Last 1 Encounters:  04/30/15 5\' 6"  (1.676 m)    Weight:   Wt Readings from Last 1 Encounters:  04/30/15 109 lb (49.442 kg)    Ideal Body Weight:  59 kg  BMI:  Body mass index is 17.6 kg/(m^2).  Estimated Nutritional Needs:   Kcal:  [id number]  Protein:  75-90 grams  Fluid:  1.7 - 1.9 L/day  EDUCATION NEEDS:   Education needs addressed  Corrin Parker, MS, RD, LDN Pager # [phone] After hours/ weekend pager # [phone]

## 2015-05-01 NOTE — Progress Notes (Addendum)
General Surgery Note   POD -  1 Day Post-Op  Assessment/Plan: 1.  LEFT TOTAL  MASTECTOMY WITH LEFT AXILLARY SENTINEL NODE BIOPSY, REMOVAL PORT-A-CATH - 05/01/2015  Significant nausea, vomiting - at this point, she is not keeping enough down to go home  Will probably stay until tomorrow, but will check on later today  2.  DVT prophylaxis - Lovenox 3.  TOBACCO ABUSE (Z72.0)  4.  HISTORY OF FUSION OF CERVICAL SPINE (Z98.1) 5.  HYPERTENSION, BENIGN (I10) 6.  CHRONIC GERD (K21.9) 7.  ANXIETY (F41.9) 8.  Urinary retention - Has foley - will get this out   Principal Problem:   Breast cancer of upper-outer quadrant of left female breast ([place])  Subjective:  Nauseated and not keeping liquids down.  Also has pain, but she said that every narcotic causes nausea (dilaudid, oxycodone, hydrocodone, ultram).  She is a little hard to talk to - but we have settled ibuprofen for pain (for now) Objective:   Filed Vitals:   05/01/15 0510  BP: 117/42  Pulse: 99  Temp: 98.4 F (36.9 C)  Resp: 19     Intake/Output from previous day:  11/11 0701 - 11/12 0700 In: 1535 [P.O.:360; I.V.:1175] Out: 680 [Urine:450; Drains:105; Blood:125]  Intake/Output this shift:      Physical Exam:   General: Older WF who is alert and oriented.    HEENT: Normal. Pupils equal. .   Lungs: Clear   Wound: Looks okay.  One drain - 105 cc recorded yesterday   Has foley   Lab Results:    Recent Labs  04/30/15 1216  WBC 7.7  HGB 9.8*  HCT 30.9*  PLT 166    BMET   Recent Labs  04/30/15 1216  CREATININE 0.41*    PT/INR  No results for input(s): LABPROT, INR in the last 72 hours.  ABG  No results for input(s): PHART, HCO3 in the last 72 hours.  Invalid input(s): PCO2, PO2   Studies/Results:  Nm Sentinel Node Inj-no Rpt (breast)  04/30/2015  CLINICAL DATA: cancer left breast Sulfur colloid was injected intradermally by the nuclear medicine technologist for breast cancer sentinel node localization.      Anti-infectives:   Anti-infectives    Start     Dose/Rate Route Frequency Ordered Stop   04/30/15 0600  ceFAZolin (ANCEF) IVPB 2 g/50 mL premix     2 g 100 mL/hr over 30 Minutes Intravenous To ShortStay Surgical 04/29/15 1318 04/30/15 0735      Alphonsa Overall, MD, FACS Pager: [facility] Office: [phone] 05/01/2015

## 2015-05-02 DIAGNOSIS — C50412 Malignant neoplasm of upper-outer quadrant of left female breast: Secondary | ICD-10-CM | POA: Diagnosis not present

## 2015-05-02 NOTE — Discharge Summary (Signed)
Physician Discharge Summary  Patient ID:  Sherry Bender  MRN: [id number]  DOB/AGE: January 29, 1951 64 y.o.  Admit date: 04/30/2015 Discharge date: 05/02/2015  Discharge Diagnoses:  1. Principal Problem:   Breast cancer of upper-outer quadrant of left female breast ([place]) 2.  Post op urinary retention - resolved 3. TOBACCO ABUSE (Z72.0)  4. HISTORY OF FUSION OF CERVICAL SPINE (Z98.1) 5. HYPERTENSION, BENIGN (I10) 6. CHRONIC GERD (K21.9) 7. ANXIETY (F41.9)  Operation: Procedure(s): LEFT TOTAL  MASTECTOMY WITH LEFT AXILLARY SENTINEL NODE BIOPSY,  REMOVAL PORT-A-CATH on 04/30/2015 - H.Dalbert Batman  Discharged Condition: good  Hospital Course: [name] is an 64 y.o. female whose primary care physician is Redge Gainer, MD and who was admitted 04/30/2015 with a chief complaint of left breast cancer.   She was brought to the operating room on 04/30/2015 and underwent  LEFT TOTAL  MASTECTOMY WITH LEFT AXILLARY SENTINEL NODE BIOPSY,  REMOVAL PORT-A-CATH .  The first day post op, she had nausea and vomiting and urinary retention.   She is now two days post op and ready to go home.  The discharge instructions were reviewed with the patient.  Consults: None  Significant Diagnostic Studies: Results for orders placed or performed during the hospital encounter of 04/30/15  CBC  Result Value Ref Range   WBC 7.7 4.0 - 10.5 K/uL   RBC 3.60 (L) 3.87 - 5.11 MIL/uL   Hemoglobin 9.8 (L) 12.0 - 15.0 g/dL   HCT 30.9 (L) 36.0 - 46.0 %   MCV 85.8 78.0 - 100.0 fL   MCH 27.2 26.0 - 34.0 pg   MCHC 31.7 30.0 - 36.0 g/dL   RDW 16.3 (H) 11.5 - 15.5 %   Platelets 166 150 - 400 K/uL  Creatinine, serum  Result Value Ref Range   Creatinine, Ser 0.41 (L) 0.44 - 1.00 mg/dL   GFR calc non Af Amer >60 >60 mL/min   GFR calc Af Amer >60 >60 mL/min    Mr Breast Bilateral W Wo Contrast  04/05/2015  CLINICAL DATA:  64 year old diagnosed with stage II B left breast cancer of the upper-outer quadrant in May  2016. Breast MRI performed in 11/26/2014 showed a 3.5 x 4.5 x 3.8 cm mass with associated biopsy clip compatible with the recent diagnosis of breast cancer. There were also scattered foci of enhancement adjacent to the biopsy-proven malignancy. In total the mass and scattered foci encompassed an area measuring 7 cm. The patient has undergone neoadjuvant chemotherapy. This MRI is performed to evaluate response to therapy. LABS:  None performed EXAM: BILATERAL BREAST MRI WITH AND WITHOUT CONTRAST TECHNIQUE: Multiplanar, multisequence MR images of both breasts were obtained prior to and following the intravenous administration of 10 ml of MultiHance. THREE-DIMENSIONAL MR IMAGE RENDERING ON INDEPENDENT WORKSTATION: Three-dimensional MR images were rendered by post-processing of the original MR data on an independent workstation. The three-dimensional MR images were interpreted, and findings are reported in the following complete MRI report for this study. Three dimensional images were evaluated at the independent DynaCad workstation COMPARISON:  Breast MRI 11/26/2014 and bilateral mammogram 11/02/2014 FINDINGS: Breast composition: d. Extreme fibroglandular tissue. Background parenchymal enhancement: Mild Right breast: No mass or abnormal enhancement. Left breast: Biopsy clip artifact is present in the upper inner left breast, middle third. There has been a complete imaging response to neoadjuvant chemotherapy. No mass or asymmetric non-mass enhancement is identified in the left breast at this time. No mass is visible on the T1 or T2 weighted images. Lymph nodes: No abnormal  appearing lymph nodes in either axilla or the internal mammary chains. Ancillary findings:  None. IMPRESSION: 1. Complete imaging response of the left breast following neoadjuvant chemotherapy. No residual mass or abnormal enhancement is identified in the left breast. 2. No MRI evidence of malignancy in the right breast. 3. Negative for  lymphadenopathy. RECOMMENDATION: Continue treatment planning. BI-RADS CATEGORY  6: Known biopsy-proven malignancy. Electronically Signed   By: Curlene Dolphin M.D.   On: 04/05/2015 10:44   Nm Sentinel Node Inj-no Rpt (breast)  04/30/2015  CLINICAL DATA: cancer left breast Sulfur colloid was injected intradermally by the nuclear medicine technologist for breast cancer sentinel node localization.    Discharge Exam:  Filed Vitals:   05/02/15 0428  BP: 142/83  Pulse: 85  Temp: 98.8 F (37.1 C)  Resp: 17    General: WN older WF who is alert.  She has a little bit of different personality. Lungs: Clear to auscultation and symmetric breath sounds. Heart:  RRR. No murmur or rub.  Chest:  Her port wound and mastectomy wound look good.  Discharge Medications:     Medication List    TAKE these medications        dexlansoprazole 60 MG capsule  Commonly known as:  DEXILANT  Take 1 capsule (60 mg total) by mouth daily.     hydrochlorothiazide 25 MG tablet  Commonly known as:  HYDRODIURIL  Take 1 tablet (25 mg total) by mouth daily.     potassium chloride 10 MEQ CR capsule  Commonly known as:  MICRO-K  Take 1 capsule (10 mEq total) by mouth daily.        Disposition: 01-Home or Self Care      Discharge Instructions    Diet - low sodium heart healthy    Complete by:  As directed      Increase activity slowly    Complete by:  As directed            Follow-up Information    Follow up with [facility]. Schedule an appointment as soon as possible for a visit in 10 days.   Specialty:  General Surgery   Why:  Keep the appointments already scheduled.  Make an appointment for a drain check in 10-12 days.   Contact information:   [address] [place] [phone]       Signed: Alphonsa Overall, M.D., [facility] Office:  [phone]  05/02/2015, 7:52 AM

## 2015-05-02 NOTE — Discharge Instructions (Signed)
[facility]___[facility] [place] °[phone] ° °MASTECTOMY: POST OP INSTRUCTIONS ° °Always review your discharge instruction sheet given to you by the facility where your surgery was performed. °IF YOU HAVE DISABILITY OR FAMILY LEAVE FORMS, YOU MUST BRING THEM TO THE OFFICE FOR PROCESSING.   °DO NOT GIVE THEM TO YOUR DOCTOR. °A prescription for pain medication may be given to you upon discharge.  Take your pain medication as prescribed, if needed.  If narcotic pain medicine is not needed, then you may take acetaminophen (Tylenol) or ibuprofen (Advil) as needed. °1. Take your usually prescribed medications unless otherwise directed. °2. If you need a refill on your pain medication, please contact your pharmacy.  They will contact our office to request authorization.  Prescriptions will not be filled after 5pm or on week-ends. °3. You should follow a light diet the first few days after arrival home, such as soup and crackers, etc.  Resume your normal diet the day after surgery. °4. Most patients will experience some swelling and bruising on the chest and underarm.  Ice packs will help.  Swelling and bruising can take several days to resolve.  °5. It is common to experience some constipation if taking pain medication after surgery.  Increasing fluid intake and taking a stool softener (such as Colace) will usually help or prevent this problem from occurring.  A mild laxative (Milk of Magnesia or Miralax) should be taken according to package instructions if there are no bowel movements after 48 hours. °6. Unless discharge instructions indicate otherwise, leave your bandage dry and in place until your next appointment in 3-5 days.  You may take a limited sponge bath.  No tube baths or showers until the drains are removed.  You may have steri-strips (small skin tapes) in place directly over the incision.  These strips should be left on the skin for 7-10 days.  If your surgeon used skin glue on the incision, you may  shower in 24 hours.  The glue will flake off over the next 2-3 weeks.  Any sutures or staples will be removed at the office during your follow-up visit. °7. DRAINS:  If you have drains in place, it is important to keep a list of the amount of drainage produced each day in your drains.  Before leaving the hospital, you should be instructed on drain care.  Call our office if you have any questions about your drains. °8. ACTIVITIES:  You may resume regular (light) daily activities beginning the next day--such as daily self-care, walking, climbing stairs--gradually increasing activities as tolerated.  You may have sexual intercourse when it is comfortable.  Refrain from any heavy lifting or straining until approved by your doctor. °a. You may drive when you are no longer taking prescription pain medication, you can comfortably wear a seatbelt, and you can safely maneuver your car and apply brakes. °b. RETURN TO WORK:  __________________________________________________________ °9. You should see your doctor in the office for a follow-up appointment approximately 3-5 days after your surgery.  Your doctor’s nurse will typically make your follow-up appointment when she calls you with your pathology report.  Expect your pathology report 2-3 business days after your surgery.  You may call to check if you do not hear from us after three days.   °10. OTHER INSTRUCTIONS: ______________________________________________________________________________________________ ____________________________________________________________________________________________ °WHEN TO CALL YOUR DOCTOR: °1. Fever over 101.0 °2. Nausea and/or vomiting °3. Extreme swelling or bruising °4. Continued bleeding from incision. °5. Increased pain, redness, or drainage from the incision. °  The clinic staff is available to answer your questions during regular business hours.  Please don’t hesitate to call and ask to speak to one of the nurses for clinical  concerns.  If you have a medical emergency, go to the nearest emergency room or call 911.  A surgeon from [facility] is always on call at the hospital. °[address], [address], [place], [place]  [address] ? [address] [address], [place], [place]   [address] °[phone] ? [phone] ? FAX [phone] ° °

## 2015-05-02 NOTE — Progress Notes (Signed)
Discussed discharge summary with patient. Reviewed all medications with patient. Patient received Rx. Patient ready for discharge. 

## 2015-05-03 ENCOUNTER — Encounter (HOSPITAL_COMMUNITY): Payer: Self-pay | Admitting: General Surgery

## 2015-05-12 ENCOUNTER — Encounter: Payer: Self-pay | Admitting: Radiation Oncology

## 2015-05-12 ENCOUNTER — Ambulatory Visit
Admission: RE | Admit: 2015-05-12 | Discharge: 2015-05-12 | Disposition: A | Discharge status: Home / Self Care | Payer: Managed Care, Other (non HMO) | Source: Ambulatory Visit | Attending: Radiation Oncology | Admitting: Radiation Oncology

## 2015-05-12 VITALS — BP 132/92 | HR 85 | Temp 98.2°F | Ht 66.0 in | Wt 107.0 lb

## 2015-05-12 DIAGNOSIS — Z171 Estrogen receptor negative status [ER-]: Secondary | ICD-10-CM | POA: Diagnosis not present

## 2015-05-12 DIAGNOSIS — C50412 Malignant neoplasm of upper-outer quadrant of left female breast: Secondary | ICD-10-CM | POA: Diagnosis present

## 2015-05-12 DIAGNOSIS — Z510 Encounter for antineoplastic radiation therapy: Secondary | ICD-10-CM | POA: Diagnosis not present

## 2015-05-12 NOTE — Progress Notes (Signed)
Sherry Bender here for consult.  Reports pain level of 7 today taking pain medication as needed.  Energy level is low taking rest breaks. BP 132/92 mmHg  Pulse 85  Temp(Src) 98.2 F (36.8 C) (Oral)  Ht 5\' 6"  (1.676 m)  Wt 107 lb (48.535 kg)  BMI 17.28 kg/m2  SpO2 99%  LMP 02/28/1999  Wt Readings from Last 3 Encounters:  05/12/15 107 lb (48.535 kg)  04/30/15 109 lb (49.442 kg)  04/22/15 107 lb 12.8 oz (48.898 kg)

## 2015-05-12 NOTE — Addendum Note (Signed)
Encounter addended by: Benn Moulder, RN on: 05/12/2015  6:14 PM<BR>     Documentation filed: Charges VN

## 2015-05-12 NOTE — Progress Notes (Signed)
Location of Breast Cancer: Left Breast  Histology per Pathology Report: Left Breast  04/30/15  11/09/14   Receptor Status: ER(0%), PR (0%), Her2-neu (insufficient evidence), Ki-67 (70%)  Ms. Sherry Bender felt a lump in her left breast for 3 months and said it was getting larger. Imaging studies showed a mass in the left breast at the 12 o'clock position 4 cm from the nipple. The mass and calcifications were approximate 5.5 cm but felt much larger. The breast was very dense. There was no axillary adenopathy. 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 Imaging studies showed a 6-7 cm band of calcifications   Past/Anticipated interventions by surgeon, if any: Lumpectomy  Past/Anticipated interventions by medical oncology, if any: Chemotherapy: Neo-adjuvant chemotherapy with Adriamycin and Cytoxan every 2 weeks 4 followed by Abraxane weekly 12 started 12/17/2014 completed 03/30/2015  Lymphedema issues, if any:  No   Pain issues, if any: No  SAFETY ISSUES:  Prior radiation? No  Pacemaker/ICD? No  Possible current pregnancy?No  Is the patient on methotrexate? No  Current Complaints / other details:     Cheston, Crista Curb, RN 05/12/2015,10:15 AM

## 2015-05-12 NOTE — Progress Notes (Addendum)
CC: Dr. Redge Gainer, Dr. Fanny Skates, Dr. Nicholas Lose, Dr. 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  I first saw her on 11/18/2014 for consultation at the multidisciplinary breast clinic.  She noted an enlarging left breast mass for approximately 2 months. Mammography on 11/02/2014 at the [facility] showed pleomorphic branching calcifications with associated increase masslike density within the left breast at 12:00 measuring 5.5 cm. Targeted ultrasound showed a mass measuring 5.3 x 3.7 x 2.5 cm at 12:00, 4 cm from the nipple. There was symmetric cortical prominence of multiple normal sized left axillary lymph nodes. Ultrasound-guided biopsy on 11/09/2014 was diagnostic for invasive ductal carcinoma/DCIS. The carcinoma was felt to be grade 3. Her tumor was ER and PR negative, HER-2/neu negative with a proliferation index of 70%.  Breast MR on 11/26/2014 showed a 3.5 x 4.5 x 3.8 cm carcinoma within the upper inner left breast.  There were other scattered foci adjacent to the mass concerning for DCIS.  The mass and scattered foci measured over 7 cm.  She will onto receive neoadjuvant chemotherapy with Dr. Lindi Adie.  She had a favorable response by MRI with no evidence for residual carcinoma.  There was no evidence for adenopathy as well.  On 04/30/2015 she underwent a left mastectomy and sentinel lymph node biopsy.  Of 3 sentinel lymph nodes, 1 was positive for isolated tumor cells within the nodal capsule lymphatics.  There was a 2 mm focus of residual invasive ductal carcinoma with widely negative margins.  She is doing well  postoperatively.  She continues to smoke intermittently.  Physical examination: Extremely thin, alert and oriented 64 year old female appearing older than her stated age. Filed Vitals:   05/12/15 1356  BP: 132/92  Pulse: 85  Temp: 98.2 F (36.8 C)   Head and neck examination: She wears a scarf.  Nodes: There is no palpable cervical, supraclavicular, or axillary lymphadenopathy.  Chest: Left-sided mastectomy with a granulating wound along with slight clear drainage.  No visible or palpable evidence for recurrent disease.  Extremities: Without edema.  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  I have 2 concerns.  She has triple negative disease with an excellent but incomplete pathologic response.  She was found to have isolated tumor cells within lymphatics which may indicate the presence of more extensive adenopathy particularly with the description of "cortical prominence of multiple normal sized left axillary nodes" at the time of her initial staging by ultrasound.  I feel that she is at significant risk for local regional failure.  I do recommend irradiation of her left chest wall  and regional lymph nodes.  We discussed the potential acute and late toxicities of radiation therapy.  We also discussed deep inspiration/breath-hold technology to avoid cardiac irradiation if necessary.  We discussed treatment in [place] at the [facility], but they do not have deep inspiration breath-hold technology.  We talked about using standard fractionation and her also receiving mastectomy scar boost for course of treatment over approximately 6 to 6-1/2 weeks.   Consent is signed today.  She will like to start in early January, therefore we will have her return in late December (the week of December 19) so she can begin her radiation therapy the week of January 2.  Since I will be retired by late December, Dr. Lisbeth Renshaw will assume her care including her CT simulation/treatment planning.  She had opportunity to meet Dr. Lisbeth Renshaw today and she is pleased with him taking over her care.  Lastly, I encouraged her to stop smoking.  Plan: As above.  45 minutes was spent face-to-face with the patient, primarily counseling patient and coordinating her care.

## 2015-05-16 NOTE — Addendum Note (Signed)
Encounter addended by: Benn Moulder, RN on: 05/16/2015  3:27 PM<BR>     Documentation filed: Flowsheet VN, BPA Follow-up Actions

## 2015-05-17 ENCOUNTER — Ambulatory Visit: Payer: Managed Care, Other (non HMO) | Admitting: Hematology and Oncology

## 2015-05-18 ENCOUNTER — Encounter: Payer: Self-pay | Admitting: Hematology and Oncology

## 2015-05-18 ENCOUNTER — Telehealth: Payer: Self-pay | Admitting: Hematology and Oncology

## 2015-05-18 ENCOUNTER — Ambulatory Visit (HOSPITAL_BASED_OUTPATIENT_CLINIC_OR_DEPARTMENT_OTHER): Payer: Managed Care, Other (non HMO) | Admitting: Hematology and Oncology

## 2015-05-18 VITALS — BP 163/75 | HR 77 | Temp 97.7°F | Resp 18 | Ht 66.0 in | Wt 105.0 lb

## 2015-05-18 DIAGNOSIS — C50412 Malignant neoplasm of upper-outer quadrant of left female breast: Secondary | ICD-10-CM

## 2015-05-18 MED ORDER — DEXLANSOPRAZOLE 60 MG PO CPDR: 60.0000 mg | DELAYED_RELEASE_CAPSULE | Freq: Every day | ORAL | Status: DC

## 2015-05-18 NOTE — Telephone Encounter (Signed)
Gave patient avs report and appointments for March 2017.  °

## 2015-05-18 NOTE — Progress Notes (Signed)
Patient Care Team: Chipper Herb, MD as PCP - General (Family Medicine) Dr Geanie Berlin (Gynecology) Holley Bouche, NP as Nurse Practitioner (Nurse Practitioner)  DIAGNOSIS: Breast cancer of upper-outer quadrant of left female breast [facility])   Staging form: Breast, AJCC 7th Edition     Clinical stage from 11/18/2014: Stage IIB (T3, N0, M0) - Unsigned   SUMMARY OF ONCOLOGIC HISTORY:   Breast cancer of upper-outer quadrant of left female breast ([place])   11/09/2014 Mammogram Left breast irregular hypoechoic mass 12:00 position 5.3 x 3.7 x 2.9 cm, left low axilla normal sized lymph nodes   11/09/2014 Initial Diagnosis Left Bx: IDC with DCIS; Grade 3, Er 0%, PR 0%, Her 2 Neg, Ki 67: 70%   11/30/2014 -  Neo-Adjuvant Chemotherapy Dose dense Adriamycin and Cytoxan 4 followed by Abraxane weekly 12   04/05/2015 Breast MRI Breast complete imaging response to neoadjuvant chemotherapy   04/30/2015 Surgery Left mastectomy: IDC 2 mm residual disease, 1 lymph node positive for isolated tumor cells within normal Lymphatics. 0/3 Lymph Nodes, T1aN0 stage IA    CHIEF COMPLIANT: follow-up after mastectomy  INTERVAL HISTORY: Sherry Bender is a 64 year old with above-mentioned history of left breast cancer treated with neoadjuvant chemotherapy for triple negative disease. She had a 5.3 cm original tumor. She underwent a left mastectomy and had spent 2 days in the hospital and is here today to discuss pathology report. She is still moderately sore but improving slowly over time. She had seen Dr. Valere Dross with radiation oncology who recommended adjuvant radiation therapy. We present her case at tumor board.  REVIEW OF SYSTEMS:   Constitutional: Denies fevers, chills or abnormal weight loss Eyes: Denies blurriness of vision Ears, nose, mouth, throat, and face: Denies mucositis or sore throat Respiratory: Denies cough, dyspnea or wheezes Cardiovascular: Denies palpitation, chest discomfort or lower extremity  swelling Gastrointestinal:  Denies nausea, heartburn or change in bowel habits Skin: Denies abnormal skin rashes Lymphatics: Denies new lymphadenopathy or easy bruising Neurological:Denies numbness, tingling or new weaknesses Behavioral/Psych: Mood is stable, no new changes   All other systems were reviewed with the patient and are negative.  I have reviewed the past medical history, past surgical history, social history and family history with the patient and they are unchanged from previous note.  ALLERGIES:  is allergic to codeine.  MEDICATIONS:  Current Outpatient Prescriptions  Medication Sig Dispense Refill  . dexlansoprazole (DEXILANT) 60 MG capsule Take 1 capsule (60 mg total) by mouth daily. 30 capsule 4  . hydrochlorothiazide (HYDRODIURIL) 25 MG tablet Take 1 tablet (25 mg total) by mouth daily. 90 tablet 0  . potassium chloride (MICRO-K) 10 MEQ CR capsule Take 1 capsule (10 mEq total) by mouth daily. 30 capsule 1   No current facility-administered medications for this visit.    PHYSICAL EXAMINATION: ECOG PERFORMANCE STATUS: 1 - Symptomatic but completely ambulatory  Filed Vitals:   05/18/15 1113  BP: 163/75  Pulse: 77  Temp: 97.7 F (36.5 C)  Resp: 18   Filed Weights   05/18/15 1113  Weight: 105 lb (47.628 kg)    GENERAL:alert, no distress and comfortable SKIN: skin color, texture, turgor are normal, no rashes or significant lesions EYES: normal, Conjunctiva are pink and non-injected, sclera clear OROPHARYNX:no exudate, no erythema and lips, buccal mucosa, and tongue normal  NECK: supple, thyroid normal size, non-tender, without nodularity LYMPH:  no palpable lymphadenopathy in the cervical, axillary or inguinal LUNGS: clear to auscultation and percussion with normal breathing effort HEART: regular  rate & rhythm and no murmurs and no lower extremity edema ABDOMEN:abdomen soft, non-tender and normal bowel sounds Musculoskeletal:no cyanosis of digits and no  clubbing  NEURO: alert & oriented x 3 with fluent speech, no focal motor/sensory deficits  LABORATORY DATA:  I have reviewed the data as listed   Chemistry      Component Value Date/Time   NA 133* 04/22/2015 1100   NA 136 04/09/2015 1038   K 3.2* 04/22/2015 1100   K 3.3* 04/09/2015 1038   CL 94* 04/22/2015 1100   CO2 30 04/22/2015 1100   CO2 29 04/09/2015 1038   BUN 10 04/22/2015 1100   BUN 8.7 04/09/2015 1038   CREATININE 0.41* 04/30/2015 1216   CREATININE 0.5* 04/09/2015 1038      Component Value Date/Time   CALCIUM 9.5 04/22/2015 1100   CALCIUM 9.8 04/09/2015 1038   ALKPHOS 64 04/22/2015 1100   ALKPHOS 73 04/09/2015 1038   AST 34 04/22/2015 1100   AST 31 04/09/2015 1038   ALT 24 04/22/2015 1100   ALT 26 04/09/2015 1038   BILITOT 0.3 04/22/2015 1100   BILITOT <0.30 04/09/2015 1038       Lab Results  Component Value Date   WBC 7.7 04/30/2015   HGB 9.8* 04/30/2015   HCT 30.9* 04/30/2015   MCV 85.8 04/30/2015   PLT 166 04/30/2015   NEUTROABS 3.4 04/22/2015   ASSESSMENT & PLAN:  Breast cancer of upper-outer quadrant of left female breast ([place]) Left breast invasive ductal carcinoma with DCIS: Patient presented with a palpable left breast mass for the last 2 months ultrasound 5.3 x 3.7 x 2.9 cm T3 N0 M0 stage IIB clinical stage grade 3, ER 0%, PR 0%, HER-2 negative, Ki-67 70% Left mastectomy 04/30/2015: IDC 2 mm residual disease, 1 lymph node positive for isolated tumor cells within normal Lymphatics. 0/3 Lymph Nodes, T1aN0 stage IA  Recommendation: Based on tumor board discussion, we will refer her to radiation oncology to discuss the pros and cons of adjuvant radiation therapy. The presence of isolated tumor cells and initial tumor size of greater than 5 cm some of the concerns. She met with Dr. Valere Dross and they recommended adjuvant radiation.  Since she had near complete response to chemotherapy, I will not recommend adjuvant Xeloda.  Hypokalemia Anemia  Return  to clinic in 4 months for follow-up with labs to recheck on her anemia and hypokalemia  No orders of the defined types were placed in this encounter.   The patient has a good understanding of the overall plan. she agrees with it. she will call with any problems that may develop before the next visit here.   Rulon Eisenmenger, MD 05/18/2015

## 2015-05-18 NOTE — Assessment & Plan Note (Signed)
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 0/3 Lymph Nodes, T1aN0 stage IA  Recommendation: Based on tumor board discussion, we will refer her to radiation oncology to discuss the pros and cons of adjuvant radiation therapy. The presence of isolated tumor cells and initial tumor size of greater than 5 cm some of the concerns. Since she had near complete response to chemotherapy, I will not recommend adjuvant Xeloda.  Hypokalemia: Anemia:  Return to clinic in 3 months for follow-up

## 2015-05-18 NOTE — Addendum Note (Signed)
Addended by: Prentiss Bells on: 05/18/2015 04:34 PM   Modules accepted: Medications

## 2015-05-20 ENCOUNTER — Other Ambulatory Visit: Payer: Self-pay | Admitting: Hematology and Oncology

## 2015-05-20 DIAGNOSIS — C50412 Malignant neoplasm of upper-outer quadrant of left female breast: Secondary | ICD-10-CM

## 2015-05-20 MED ORDER — MORPHINE SULFATE 30 MG PO TABS: 30.0000 mg | ORAL_TABLET | ORAL | Status: DC | PRN

## 2015-05-20 MED ORDER — HYDROCODONE-ACETAMINOPHEN 5-300 MG PO TABS: 1.0000 | ORAL_TABLET | Freq: Four times a day (QID) | ORAL | Status: DC

## 2015-05-26 ENCOUNTER — Encounter: Payer: Self-pay | Admitting: *Deleted

## 2015-05-26 NOTE — Progress Notes (Signed)
[place] Psychosocial Distress Screening Clinical Social Work  Clinical Social Work was referred by distress screening protocol.  The patient scored a 9 on the Psychosocial Distress Thermometer which indicates severe distress. Clinical Social Worker reviewed chart and phoned pt to assess for distress and other psychosocial needs. CSW spoke with pt via phone and her biggest concern appears to be traveling back and forth. CSW discussed options such as gas card. Pt plans to reach out to friends and family for additional support. CSW also provided pt with ACS number. CSW will check in at her first appt.   ONCBCN DISTRESS SCREENING 05/12/2015  Screening Type Initial Screening  Distress experienced in past week (1-10) 9  Practical problem type   Family Problem type Other (comment)  Emotional problem type Nervousness/Anxiety;Adjusting to appearance changes  Information Concerns Type   Physical Problem type Sleep/insomnia  Physician notified of physical symptoms   Referral to clinical psychology   Referral to clinical social work (No Data)  Referral to dietition   Referral to financial advocate   Referral to support programs   Referral to palliative care     Clinical Social Worker follow up needed: Yes.    If yes, follow up plan:  See above Loren Racer, [place] Worker [place]  [facility] Phone: [phone] Fax: [phone]

## 2015-05-27 ENCOUNTER — Other Ambulatory Visit: Payer: Self-pay

## 2015-05-27 MED ORDER — DEXLANSOPRAZOLE 60 MG PO CPDR: 60.0000 mg | DELAYED_RELEASE_CAPSULE | Freq: Every day | ORAL | Status: DC

## 2015-05-27 NOTE — Telephone Encounter (Signed)
Notice from [facility] - Dexilant not covered.  Per Dr. Lindi Adie, re-order generic.  Rx sent to [facility] OP

## 2015-05-31 ENCOUNTER — Ambulatory Visit: Payer: Managed Care, Other (non HMO) | Attending: General Surgery | Admitting: Physical Therapy

## 2015-05-31 ENCOUNTER — Encounter: Payer: Self-pay | Admitting: Physical Therapy

## 2015-05-31 DIAGNOSIS — Z9012 Acquired absence of left breast and nipple: Secondary | ICD-10-CM | POA: Diagnosis present

## 2015-05-31 DIAGNOSIS — C50412 Malignant neoplasm of upper-outer quadrant of left female breast: Secondary | ICD-10-CM | POA: Diagnosis present

## 2015-05-31 DIAGNOSIS — R293 Abnormal posture: Secondary | ICD-10-CM | POA: Diagnosis present

## 2015-05-31 NOTE — Therapy (Signed)
[place], [place], [address] Phone: [phone]   Fax:  [phone]  Physical Therapy Evaluation  Patient Details  Name: Sherry Bender MRN: [id number] Date of Birth: 08-30-1950 No Data Recorded  Encounter Date: 05/31/2015      PT End of Session - 05/31/15 1309    Visit Number 2   Number of Visits 2   PT Start Time 1252   PT Stop Time 1325   PT Time Calculation (min) 33 min      Past Medical History  Diagnosis Date  . Hyperlipidemia   . Hypertension   . Complication of anesthesia     "HARD TO WAKE UP".......Marland KitchenN/V  . PONV (postoperative nausea and vomiting)   . GERD (gastroesophageal reflux disease)   . Arthritis     IN BACK....  . Scoliosis   . 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 Anxiety   . Urinary frequency   . Breast cancer ([place]) 2016    Past Surgical History  Procedure Laterality Date  . Cholecystectomy  1994  . Abdominal hysterectomy  2000    TAH/BSO  . Knee arthroscopy  2005    right knee  . Foot neuroma surgery  01/2010    also had metatarasl shortened  . Rhinoplasty    . Cervical fusion    . Anterior cervical decomp/discectomy fusion N/A 06/18/2014    Procedure: C4-5 C5-6 C6-7 ANTERIOR CERVICAL DECOMPRESSION/DISKECTOMY/FUSION;  Surgeon: Eustace Moore, MD;  Location: [facility];  Service: Neurosurgery;  Laterality: N/A;  C4-5 C5-6 C6-7 ANTERIOR CERVICAL DECOMPRESSION/DISKECTOMY/FUSION  . Anterior cervical decomp/discectomy fusion N/A 06/25/2014    Procedure: EXPLORATION OF CERVICAL FUSION WITH REVIOSN OF HARDWARE;  Surgeon: Eustace Moore, MD;  Location: [facility];  Service: Neurosurgery;  Laterality: N/A;  . Portacath placement N/A 11/23/2014    Procedure: INSERTION PORT-A-CATH WITH ULTRASOUND;  Surgeon: Fanny Skates, MD;  Location: [facility];  Service: General;  Laterality: N/A;  . Complete mastectomy w/ sentinel node biopsy Left 04/30/2015  . Simple  mastectomy with axillary sentinel node biopsy Left 04/30/2015    Procedure: LEFT TOTAL  MASTECTOMY WITH LEFT AXILLARY SENTINEL NODE BIOPSY;  Surgeon: Fanny Skates, MD;  Location: [facility];  Service: General;  Laterality: Left;  . Port-a-cath removal Right 04/30/2015    Procedure: REMOVAL PORT-A-CATH;  Surgeon: Fanny Skates, MD;  Location: [facility];  Service: General;  Laterality: Right;    There were no vitals filed for this visit.  Visit Diagnosis:  H/O left mastectomy - Plan: PT plan of care cert/re-cert  Abnormal posture - Plan: PT plan of care cert/re-cert  Carcinoma of upper-outer quadrant of left female breast [facility]) - Plan: PT plan of care cert/re-cert      Subjective Assessment - 05/31/15 1252    Subjective Patient reports her surgeon referred her to PT but states she is doing great and doesn't think she really needs to come more that today.  She had a left mastectomy with a SLNB (2 nodes) removed on 04/30/15.  I've been doing my exercises and doing well.   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  Her mass was located at 12:00 position in the upper outer quadrant.  She underwent neoadjuvant chemotherapy from June-October 2016.  Then she had a left mastectomy with a SLNB (2 nodes) removed on 04/30/15.  She will begin radiation in January  2017.   Patient Stated Goals Be sure I'm doing everything right for my arm   Currently in Pain? No/denies            [facility] PT Assessment - 05/31/15 0001    Assessment   Medical Diagnosis s/p left mastectomy with SLNB   Onset Date/Surgical Date 04/30/15   Hand Dominance Right   Precautions   Precautions Other (comment)  Left arm lymphedema risk; recent left mastectomy   Restrictions   Weight Bearing Restrictions No   Balance Screen   Has the patient fallen in the past 6 months No   Has the patient had a decrease in activity level because of a fear of falling?  No    Is the patient reluctant to leave their home because of a fear of falling?  No   Home Environment   Living Environment Private residence   Living Arrangements Spouse/significant other   Available Help at Discharge Family   Prior Function   Level of [place] Retired   Leisure She is walking 2 times per week for 30 minutes   Cognition   Overall Cognitive Status Within Functional Limits for tasks assessed   Posture/Postural Control   Posture/Postural Control Postural limitations   Postural Limitations Rounded Shoulders;Forward head;Increased thoracic kyphosis;Increased lumbar lordosis  Cervical fusion 06/17/14;lower thoracic posterior protrusion   ROM / Strength   AROM / PROM / Strength AROM;Strength   AROM   AROM Assessment Site Shoulder   Right/Left Shoulder Right;Left   Right Shoulder Extension 48 Degrees   Right Shoulder Flexion 137 Degrees   Right Shoulder ABduction 151 Degrees   Right Shoulder Internal Rotation 65 Degrees   Right Shoulder External Rotation 74 Degrees   Left Shoulder Extension 53 Degrees   Left Shoulder Flexion 129 Degrees   Left Shoulder ABduction 153 Degrees   Left Shoulder Internal Rotation 63 Degrees   Left Shoulder External Rotation 73 Degrees   Strength   Overall Strength Within functional limits for tasks performed           LYMPHEDEMA/ONCOLOGY QUESTIONNAIRE - 05/31/15 1304    Type   Cancer Type s/p left mastectomy with SLNB   Surgeries   Mastectomy Date 04/30/15   Sentinel Lymph Node Biopsy Date 04/30/15   Number Lymph Nodes Removed 2   Treatment   Active Chemotherapy Treatment No   Past Chemotherapy Treatment Yes   Date 03/20/15   Active Radiation Treatment No   Past Radiation Treatment No   Current Hormone Treatment No   Past Hormone Therapy No   What other symptoms do you have   Are you Having Heaviness or Tightness No   Are you having Pain No   Are you having pitting edema No   Is it Hard or  Difficult finding clothes that fit No   Do you have infections No   Is there Decreased scar mobility No   [place] Sign No            PT Education - 05/31/15 1308    Education provided Yes   Education Details Reviewed post op shoulder ROM HEP given pre-operatively   Person(s) Educated Patient   Methods Explanation   Comprehension Verbalized understanding              Breast Clinic Goals - 11/18/14 1257    Patient will be able to verbalize understanding of pertinent lymphedema risk reduction practices relevant to her diagnosis specifically related to skin care.  Time 1   Period Days   Status Achieved   Patient will be able to return demonstrate and/or verbalize understanding of the post-op home exercise program related to regaining shoulder range of motion.   Time 1   Period Days   Status Achieved   Patient will be able to verbalize understanding of the importance of attending the postoperative After Breast Cancer Class for further lymphedema risk reduction education and therapeutic exercise.   Time 1   Period Days   Status Achieved          Long Term Clinic Goals - 05/31/15 1321    CC Long Term Goal  #1   Title Patient will verbalize understanding of risk reduction practices related to lymphedema.   Time 1   Period Days   Status Achieved   CC Long Term Goal  #2   Title Patient will verbalize understanding of previously given HEP and importance of continuing during radiation.   Time 1   Period Days   Status Achieved            Plan - 05/31/15 1309    Clinical Impression Statement Patient is a very pleasant woman s/p a left mastectomy with 2 axillary lymph nodes removed on 04/30/15.  She has done quite well since surgery and has done her exercises.  Her shoulder ROM has returned to baseline and she does not want to do therapy at this time and there is no need.  She was educated on lymphedema risk reduction today and verbalized good understanding.   Pt  will benefit from skilled therapeutic intervention in order to improve on the following deficits Decreased knowledge of precautions   Rehab Potential Excellent   Clinical Impairments Affecting Rehab Potential none   PT Frequency One time visit   PT Treatment/Interventions Patient/family education;Therapeutic exercise   PT Next Visit Plan No need for further PT at this time.   PT Home Exercise Plan Shoulder ROM HEP   Consulted and Agree with Plan of Care Patient         Problem List Patient Active Problem List   Diagnosis Date Noted  . Breast cancer of upper-outer quadrant of left female breast ([place]) 11/11/2014  . S/P cervical spinal fusion 06/24/2014  . HTN (hypertension) 12/06/2010  . Tobacco abuse 12/06/2010  . Hyperlipidemia 09/26/2010  . Nodular goiter 09/26/2010  . GERD (gastroesophageal reflux disease) 09/26/2010  . Degenerative disc disease 09/26/2010  . Allergic rhinitis 09/26/2010  . Anxiety disorder 09/26/2010  . Cyst, breast 09/26/2010    Annia Friendly, PT 05/31/2015 1:26 PM  [place], [place], [address] Phone: [phone]   Fax:  [phone]  Name: Sherry Bender MRN: [id number] Date of Birth: [birth year]

## 2015-06-01 ENCOUNTER — Other Ambulatory Visit: Payer: Self-pay

## 2015-06-01 DIAGNOSIS — K219 Gastro-esophageal reflux disease without esophagitis: Secondary | ICD-10-CM

## 2015-06-01 DIAGNOSIS — C50412 Malignant neoplasm of upper-outer quadrant of left female breast: Secondary | ICD-10-CM

## 2015-06-01 MED ORDER — ESOMEPRAZOLE MAGNESIUM 40 MG PO PACK: 40.0000 mg | PACK | Freq: Every day | ORAL | Status: DC

## 2015-06-07 ENCOUNTER — Ambulatory Visit: Payer: Managed Care, Other (non HMO) | Admitting: Radiation Oncology

## 2015-06-09 ENCOUNTER — Ambulatory Visit
Admission: RE | Admit: 2015-06-09 | Discharge: 2015-06-09 | Disposition: A | Discharge status: Home / Self Care | Payer: Managed Care, Other (non HMO) | Source: Ambulatory Visit | Attending: Radiation Oncology | Admitting: Radiation Oncology

## 2015-06-09 DIAGNOSIS — C50412 Malignant neoplasm of upper-outer quadrant of left female breast: Secondary | ICD-10-CM

## 2015-06-09 DIAGNOSIS — Z510 Encounter for antineoplastic radiation therapy: Secondary | ICD-10-CM | POA: Diagnosis not present

## 2015-06-09 NOTE — Progress Notes (Signed)
  Radiation Oncology         [phone]) [phone] ________________________________  Name: Sherry Bender MRN: [id number]  Date: 06/09/2015  DOB: 1950/08/13  Optical Surface Tracking Plan:  Since intensity modulated radiotherapy (IMRT) and 3D conformal radiation treatment methods are predicated on accurate and precise positioning for treatment, intrafraction motion monitoring is medically necessary to ensure accurate and safe treatment delivery.  The ability to quantify intrafraction motion without excessive ionizing radiation dose can only be performed with optical surface tracking. Accordingly, surface imaging offers the opportunity to obtain 3D measurements of patient position throughout IMRT and 3D treatments without excessive radiation exposure.  I am ordering optical surface tracking for this patient's upcoming course of radiotherapy. ________________________________  Kyung Rudd, MD 06/09/2015 5:35 PM    Reference:   Particia Jasper, et al. Surface imaging-based analysis of intrafraction motion for breast radiotherapy patients.Journal of [place], n. 6, nov. 2014. ISSN [id number].   Available at: <http://www.jacmp.org/index.php/jacmp/article/view/4957>.

## 2015-06-09 NOTE — Progress Notes (Signed)
  Radiation Oncology         [phone]) [phone] ________________________________  Name: Sherry Bender MRN: [id number]  Date: 06/09/2015  DOB: 1950-06-23  Diagnosis DIAGNOSIS:     ICD-9-CM ICD-10-CM   1. Breast cancer of upper-outer quadrant of left female breast ([place]) 174.4 C50.412      SIMULATION AND TREATMENT PLANNING NOTE  The patient presented for simulation prior to beginning her course of radiation treatment for her diagnosis of left-sided breast cancer. The patient was placed in a supine position on a breast board. A customized vac-lock bag was also constructed and this complex treatment device will be used on a daily basis during her treatment. In this fashion, a CT scan was obtained through the chest area and an isocenter was placed near the chest wall at the upper aspect of the right chest. A breath-hold technique has also been evaluated to determine if this significantly improves the spatial relationship between the target region and the heart. Based on this analysis, a breath-hold technique has not been ordered for the patient's treatment.  The patient will be planned to receive a course of radiation initially to a dose of 50.4 gray. This will consist of a 4 field technique targeting the left chest wall as well as the supraclavicular region. Therefore 2 customized medial and lateral tangent fields have been created targeting the chest wall, and also 2 additional customized fields have been designed to treat the supraclavicular region both with a left supraclavicular field and a left posterior axillary boost field. A forward planning/reduced field technique will also be evaluated to determine if this significantly improves the dose homogeneity of the overall plan. Therefore, additional customized blocks/fields may be necessary.  This initial treatment will be accomplished at 1.8 gray per fraction.   The initial plan will consist of a 3-D conformal technique. The target volume/scar, heart and  lungs have been contoured and dose volume histograms of each of these structures will be evaluated as part of the 3-D conformal treatment planning process.   It is anticipated that the patient will then receive a 10 gray boost to the surgical scar. This will be accomplished at 2 gray per fraction. The final anticipated total dose therefore will correspond to 60.4 gray.    _______________________________   Jodelle Gross, MD, PhD

## 2015-06-10 ENCOUNTER — Other Ambulatory Visit: Payer: Self-pay | Admitting: *Deleted

## 2015-06-10 ENCOUNTER — Telehealth: Payer: Self-pay | Admitting: *Deleted

## 2015-06-10 DIAGNOSIS — C50412 Malignant neoplasm of upper-outer quadrant of left female breast: Secondary | ICD-10-CM

## 2015-06-10 MED ORDER — DIAZEPAM 5 MG PO TABS: 5.0000 mg | ORAL_TABLET | Freq: Four times a day (QID) | ORAL | Status: DC | PRN

## 2015-06-10 NOTE — Telephone Encounter (Signed)
TC from patient requesting refill on her Valium. Reviewed med list and did not see Valium listed.  Pt is unable to tell this Probation officer when she got it filled last but stated the prescription 'expired 12/17" though could not tell me what year.   She also stated that it is ok to use [facility] [place], though she said current bottle is from [facility].  TC to [facility] - pharmacist states that last filled at their pharmacy on 12/07/14. It is the only time they have filled it there.

## 2015-06-10 NOTE — Telephone Encounter (Signed)
Let patient know that [facility] [place]. She verbalized understanding.

## 2015-06-11 DIAGNOSIS — Z510 Encounter for antineoplastic radiation therapy: Secondary | ICD-10-CM | POA: Diagnosis not present

## 2015-06-17 ENCOUNTER — Ambulatory Visit
Admission: RE | Admit: 2015-06-17 | Payer: Managed Care, Other (non HMO) | Source: Ambulatory Visit | Admitting: Radiation Oncology

## 2015-06-17 ENCOUNTER — Ambulatory Visit
Admission: RE | Admit: 2015-06-17 | Discharge: 2015-06-17 | Disposition: A | Discharge status: Home / Self Care | Payer: Managed Care, Other (non HMO) | Source: Ambulatory Visit | Attending: Radiation Oncology | Admitting: Radiation Oncology

## 2015-06-22 ENCOUNTER — Ambulatory Visit
Admission: RE | Admit: 2015-06-22 | Discharge: 2015-06-22 | Disposition: A | Discharge status: Home / Self Care | Payer: Managed Care, Other (non HMO) | Source: Ambulatory Visit | Attending: Radiation Oncology | Admitting: Radiation Oncology

## 2015-06-22 ENCOUNTER — Ambulatory Visit: Payer: Managed Care, Other (non HMO) | Admitting: Radiation Oncology

## 2015-06-22 ENCOUNTER — Other Ambulatory Visit: Payer: Self-pay | Admitting: Radiation Oncology

## 2015-06-22 DIAGNOSIS — C50412 Malignant neoplasm of upper-outer quadrant of left female breast: Secondary | ICD-10-CM

## 2015-06-22 DIAGNOSIS — Z510 Encounter for antineoplastic radiation therapy: Secondary | ICD-10-CM | POA: Diagnosis not present

## 2015-06-22 MED ORDER — HYDROCODONE-ACETAMINOPHEN 5-325 MG PO TABS
1.0000 | ORAL_TABLET | Freq: Once | ORAL | Status: DC
  Filled 2015-06-22: qty 1

## 2015-06-22 MED ORDER — HYDROCODONE-ACETAMINOPHEN 5-325 MG PO TABS
1.0000 | ORAL_TABLET | Freq: Once | ORAL | Status: AC
  Administered 2015-06-22: 1 via ORAL
  Filled 2015-06-22: qty 1

## 2015-06-22 MED ORDER — RADIAPLEXRX EX GEL
Freq: Once | CUTANEOUS | Status: AC
  Administered 2015-06-22: 14:00:00 via TOPICAL

## 2015-06-22 MED ORDER — ALRA NON-METALLIC DEODORANT (RAD-ONC)
1.0000 "application " | Freq: Once | TOPICAL | Status: AC
  Administered 2015-06-22: 1 via TOPICAL

## 2015-06-22 NOTE — Progress Notes (Signed)
Patient education done breast, radiaplex gel, alra, radiation book, my business card, discussed ways to manage fatigue, skin irritation, soreness,sweling in breast, increase protein in diet, drink plenty water,fluids,stay hydrated, patient then stated she couldn't ly flat on the table  Due to her curvature in her pine, her husband is with her, asked if she had taken her vicodin, "No stated patient", also stated she couldn't drive  If she took pain medication, she lives in [place], [place] spoke with Dr. Pablo Ledger, patient does get nauseated with pain med because Codeine makes her nauseated but stated she had eaten  Lunch, and would try a pain pill, per Dr. Pablo Ledger, patient given 1 Norco/vicodin 5/325mg , x 1 at 2pm, with applesauce, notified Miranda RT therapist , patient needs for pain med to take effect, patient to be re- CT-simmed,  2:07 PM Patient re assessed for pain, Patient completed Ct simulation, no pain stated patient, tolerated well on the table 2:35 PM

## 2015-06-23 ENCOUNTER — Ambulatory Visit: Payer: Managed Care, Other (non HMO)

## 2015-06-23 MED FILL — DEXILANT DR 60 MG CAPSULE: 60 | 30 days supply | Qty: 30 | Fill #1

## 2015-06-24 ENCOUNTER — Ambulatory Visit
Admission: RE | Admit: 2015-06-24 | Discharge: 2015-06-24 | Disposition: A | Discharge status: Home / Self Care | Payer: Managed Care, Other (non HMO) | Source: Ambulatory Visit | Attending: Radiation Oncology | Admitting: Radiation Oncology

## 2015-06-24 ENCOUNTER — Ambulatory Visit: Payer: Managed Care, Other (non HMO)

## 2015-06-24 DIAGNOSIS — Z510 Encounter for antineoplastic radiation therapy: Secondary | ICD-10-CM | POA: Diagnosis not present

## 2015-06-25 ENCOUNTER — Ambulatory Visit: Payer: Managed Care, Other (non HMO)

## 2015-06-25 ENCOUNTER — Encounter: Payer: Self-pay | Admitting: Radiation Oncology

## 2015-06-28 ENCOUNTER — Ambulatory Visit: Payer: Managed Care, Other (non HMO) | Admitting: Radiation Oncology

## 2015-06-28 ENCOUNTER — Ambulatory Visit: Payer: Managed Care, Other (non HMO)

## 2015-06-28 ENCOUNTER — Ambulatory Visit
Admission: RE | Admit: 2015-06-28 | Discharge: 2015-06-28 | Disposition: A | Discharge status: Home / Self Care | Payer: Managed Care, Other (non HMO) | Source: Ambulatory Visit | Attending: Radiation Oncology | Admitting: Radiation Oncology

## 2015-06-28 DIAGNOSIS — Z510 Encounter for antineoplastic radiation therapy: Secondary | ICD-10-CM | POA: Diagnosis not present

## 2015-06-29 ENCOUNTER — Ambulatory Visit
Admission: RE | Admit: 2015-06-29 | Discharge: 2015-06-29 | Disposition: A | Discharge status: Home / Self Care | Payer: Managed Care, Other (non HMO) | Source: Ambulatory Visit | Attending: Radiation Oncology | Admitting: Radiation Oncology

## 2015-06-29 ENCOUNTER — Ambulatory Visit: Payer: Managed Care, Other (non HMO)

## 2015-06-29 DIAGNOSIS — Z510 Encounter for antineoplastic radiation therapy: Secondary | ICD-10-CM | POA: Diagnosis not present

## 2015-06-30 ENCOUNTER — Ambulatory Visit
Admission: RE | Admit: 2015-06-30 | Discharge: 2015-06-30 | Disposition: A | Discharge status: Home / Self Care | Payer: Managed Care, Other (non HMO) | Source: Ambulatory Visit | Attending: Radiation Oncology | Admitting: Radiation Oncology

## 2015-06-30 DIAGNOSIS — Z510 Encounter for antineoplastic radiation therapy: Secondary | ICD-10-CM | POA: Diagnosis not present

## 2015-07-01 ENCOUNTER — Ambulatory Visit
Admission: RE | Admit: 2015-07-01 | Discharge: 2015-07-01 | Disposition: A | Discharge status: Home / Self Care | Payer: Managed Care, Other (non HMO) | Source: Ambulatory Visit | Attending: Radiation Oncology | Admitting: Radiation Oncology

## 2015-07-01 DIAGNOSIS — Z510 Encounter for antineoplastic radiation therapy: Secondary | ICD-10-CM | POA: Diagnosis not present

## 2015-07-01 IMAGING — RF DG C-ARM 61-120 MIN
1 series · 1 of 1 positions shown · non-contrast
Comparison: CT 06/24/2014 and earlier studies

CLINICAL DATA: Exploration and revision of cervical hardware,
replacement of 2 screws due to pain

EXAM:
DG C-ARM 61-120 MIN; DG CERVICAL SPINE - 1 VIEW
:

[Series 1: run · 1 of 1 slices shown]
[im 1/1]
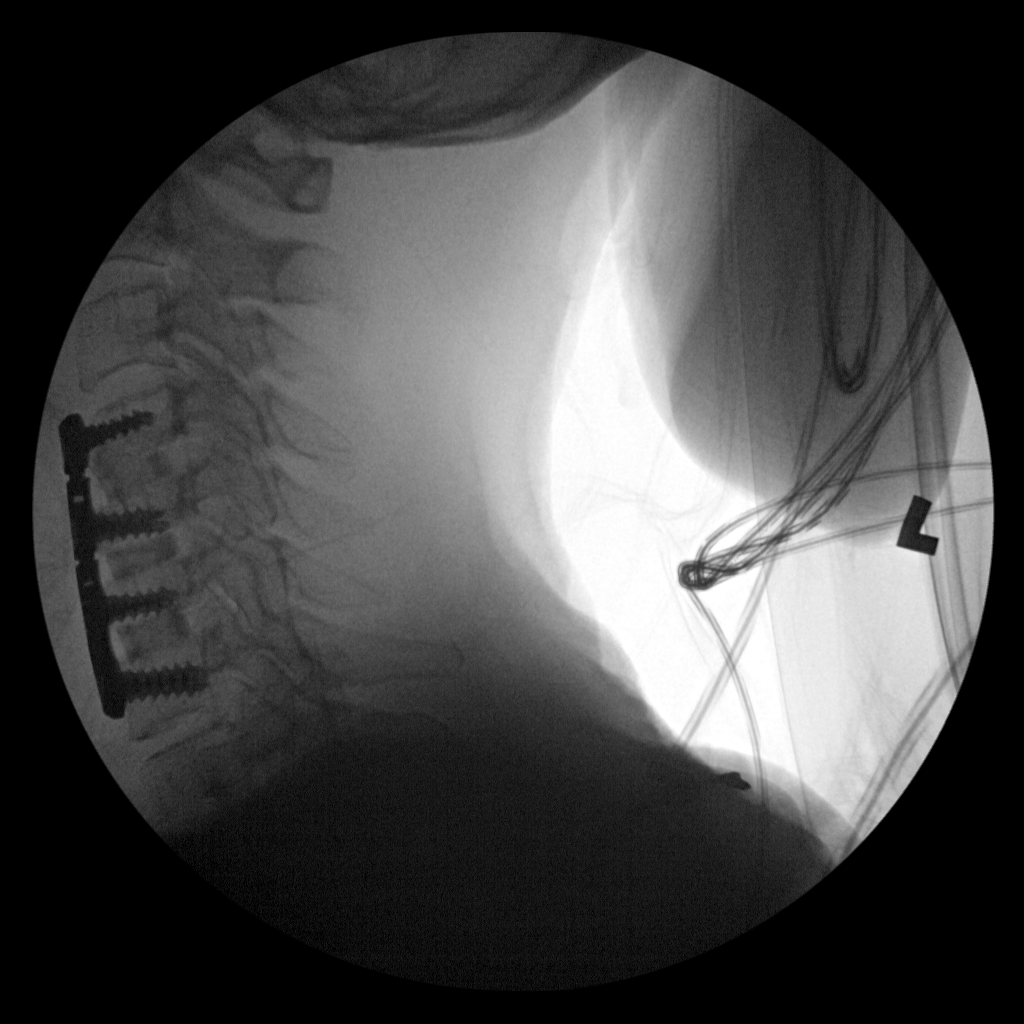

[1 of 1 positions shown; findings below may reference images not displayed]

FINDINGS: A single fluoroscopic spot image documents ACDF hardware C4-C7. The
C7 screws now project in expected location. No external localizing
instruments are visualized.
IMPRESSION: 1. ACDF C4-C7, with revision of C7 screw placement.

## 2015-07-02 ENCOUNTER — Ambulatory Visit
Admission: RE | Admit: 2015-07-02 | Discharge: 2015-07-02 | Disposition: A | Discharge status: Home / Self Care | Payer: Managed Care, Other (non HMO) | Source: Ambulatory Visit | Attending: Radiation Oncology | Admitting: Radiation Oncology

## 2015-07-02 ENCOUNTER — Encounter: Payer: Self-pay | Admitting: Radiation Oncology

## 2015-07-02 VITALS — BP 148/58 | HR 90 | Temp 98.2°F | Resp 16 | Wt 101.8 lb

## 2015-07-02 DIAGNOSIS — Z510 Encounter for antineoplastic radiation therapy: Secondary | ICD-10-CM | POA: Diagnosis not present

## 2015-07-02 DIAGNOSIS — C50412 Malignant neoplasm of upper-outer quadrant of left female breast: Secondary | ICD-10-CM

## 2015-07-02 NOTE — Progress Notes (Signed)
   Department of Radiation Oncology  Phone:  [phone] Fax:        [phone]  Weekly Treatment Note    Name: [name] Date: 07/02/2015 MRN: [id number] DOB: [birth year]   Diagnosis:     ICD-9-CM ICD-10-CM   1. Breast cancer of upper-outer quadrant of left female breast ([place]) 174.4 C50.412      Current dose: 9 Gy  Current fraction: 5   MEDICATIONS: Current Outpatient Prescriptions  Medication Sig Dispense Refill  . dexlansoprazole (DEXILANT) 60 MG capsule Take 1 capsule (60 mg total) by mouth daily. 30 capsule 4  . diazepam (VALIUM) 5 MG tablet Take 1 tablet (5 mg total) by mouth every 6 (six) hours as needed for anxiety. 30 tablet 0  . esomeprazole (NEXIUM) 40 MG packet Take 40 mg by mouth daily before breakfast. 30 each 2  . hyaluronate sodium (RADIAPLEXRX) GEL Apply 1 application topically 2 (two) times daily.    . hydrochlorothiazide (HYDRODIURIL) 25 MG tablet Take 1 tablet (25 mg total) by mouth daily. 90 tablet 0  . Hydrocodone-Acetaminophen (VICODIN) 5-300 MG TABS Take 1 tablet by mouth every 6 (six) hours. 30 each 0  . methocarbamol (ROBAXIN) 500 MG tablet TAKE 1 TABLET BY MOUTH EVERY 8 HOURS FOR MUSCLE SPASMS  0  . non-metallic deodorant (ALRA) MISC Apply 1 application topically daily as needed.    . ondansetron (ZOFRAN) 4 MG tablet Take 4 mg by mouth every 6 (six) hours as needed. for nausea  0  . potassium chloride (MICRO-K) 10 MEQ CR capsule Take 1 capsule (10 mEq total) by mouth daily. 30 capsule 1  . estradiol (ESTRACE) 1 MG tablet Take 1 mg by mouth every other day.     No current facility-administered medications for this encounter.     ALLERGIES: Codeine   LABORATORY DATA:  Lab Results  Component Value Date   WBC 7.7 04/30/2015   HGB 9.8* 04/30/2015   HCT 30.9* 04/30/2015   MCV 85.8 04/30/2015   PLT 166 04/30/2015   Lab Results  Component Value Date   NA 133* 04/22/2015   K 3.2* 04/22/2015   CL 94* 04/22/2015   CO2 30 04/22/2015    Lab Results  Component Value Date   ALT 24 04/22/2015   AST 34 04/22/2015   ALKPHOS 64 04/22/2015   BILITOT 0.3 04/22/2015     NARRATIVE: Sherry Bender was seen today for weekly treatment management. The chart was checked and the patient's films were reviewed.  Weekly rad tx left chest wall 5/33 completed,no skin changes, slight pink tinge color on skin, using radiaplex bid, pain in chest wal on Monday and Tuesday took Vicodin which hlped styated patient 5:18 PM  BP 148/58 mmHg  Pulse 90  Temp(Src) 98.2 F (36.8 C) (Oral)  Resp 16  Wt 101 lb 12.8 oz (46.176 kg)  LMP 02/28/1999  Wt Readings from Last 3 Encounters:  07/02/15 101 lb 12.8 oz (46.176 kg)  05/18/15 105 lb (47.628 kg)  05/12/15 107 lb (48.535 kg)     PHYSICAL EXAMINATION: weight is 101 lb 12.8 oz (46.176 kg). Her oral temperature is 98.2 F (36.8 C). Her blood pressure is 148/58 and her pulse is 90. Her respiration is 16.        ASSESSMENT: The patient is doing satisfactorily with treatment.  PLAN: We will continue with the patient's radiation treatment as planned. The patient is doing much better with a change in position.

## 2015-07-02 NOTE — Progress Notes (Signed)
Weekly rad tx left chest wall 5/33 completed,no skin changes, slight pink tinge color on skin, using radiaplex bid, pain in chest wal on Monday and Tuesday took Vicodin which hlped styated patient 1:33 PM  BP 148/58 mmHg  Pulse 90  Temp(Src) 98.2 F (36.8 C) (Oral)  Resp 16  Wt 101 lb 12.8 oz (46.176 kg)  LMP 02/28/1999  Wt Readings from Last 3 Encounters:  07/02/15 101 lb 12.8 oz (46.176 kg)  05/18/15 105 lb (47.628 kg)  05/12/15 107 lb (48.535 kg)

## 2015-07-05 ENCOUNTER — Ambulatory Visit
Admission: RE | Admit: 2015-07-05 | Discharge: 2015-07-05 | Disposition: A | Discharge status: Home / Self Care | Payer: Managed Care, Other (non HMO) | Source: Ambulatory Visit | Attending: Radiation Oncology | Admitting: Radiation Oncology

## 2015-07-05 ENCOUNTER — Encounter: Payer: Self-pay | Admitting: *Deleted

## 2015-07-05 DIAGNOSIS — Z510 Encounter for antineoplastic radiation therapy: Secondary | ICD-10-CM | POA: Diagnosis not present

## 2015-07-06 ENCOUNTER — Ambulatory Visit
Admission: RE | Admit: 2015-07-06 | Discharge: 2015-07-06 | Disposition: A | Discharge status: Home / Self Care | Payer: Managed Care, Other (non HMO) | Source: Ambulatory Visit | Attending: Radiation Oncology | Admitting: Radiation Oncology

## 2015-07-06 DIAGNOSIS — Z510 Encounter for antineoplastic radiation therapy: Secondary | ICD-10-CM | POA: Diagnosis not present

## 2015-07-07 ENCOUNTER — Ambulatory Visit
Admission: RE | Admit: 2015-07-07 | Discharge: 2015-07-07 | Disposition: A | Discharge status: Home / Self Care | Payer: Managed Care, Other (non HMO) | Source: Ambulatory Visit | Attending: Radiation Oncology | Admitting: Radiation Oncology

## 2015-07-07 DIAGNOSIS — Z510 Encounter for antineoplastic radiation therapy: Secondary | ICD-10-CM | POA: Diagnosis not present

## 2015-07-08 ENCOUNTER — Ambulatory Visit
Admission: RE | Admit: 2015-07-08 | Discharge: 2015-07-08 | Disposition: A | Discharge status: Home / Self Care | Payer: Managed Care, Other (non HMO) | Source: Ambulatory Visit | Attending: Radiation Oncology | Admitting: Radiation Oncology

## 2015-07-08 DIAGNOSIS — Z510 Encounter for antineoplastic radiation therapy: Secondary | ICD-10-CM | POA: Diagnosis not present

## 2015-07-09 ENCOUNTER — Ambulatory Visit
Admission: RE | Admit: 2015-07-09 | Discharge: 2015-07-09 | Disposition: A | Discharge status: Home / Self Care | Payer: Managed Care, Other (non HMO) | Source: Ambulatory Visit | Attending: Radiation Oncology | Admitting: Radiation Oncology

## 2015-07-09 ENCOUNTER — Ambulatory Visit: Payer: Managed Care, Other (non HMO)

## 2015-07-09 ENCOUNTER — Inpatient Hospital Stay
Admission: RE | Admit: 2015-07-09 | Discharge: 2015-07-09 | Disposition: A | Discharge status: Home / Self Care | Payer: Self-pay | Source: Ambulatory Visit | Attending: Radiation Oncology | Admitting: Radiation Oncology

## 2015-07-12 ENCOUNTER — Encounter: Payer: Self-pay | Admitting: Radiation Oncology

## 2015-07-12 ENCOUNTER — Ambulatory Visit
Admission: RE | Admit: 2015-07-12 | Discharge: 2015-07-12 | Disposition: A | Discharge status: Home / Self Care | Payer: Managed Care, Other (non HMO) | Source: Ambulatory Visit | Attending: Radiation Oncology | Admitting: Radiation Oncology

## 2015-07-12 VITALS — BP 152/69 | HR 91 | Temp 97.7°F | Resp 20 | Wt 101.8 lb

## 2015-07-12 DIAGNOSIS — Z510 Encounter for antineoplastic radiation therapy: Secondary | ICD-10-CM | POA: Diagnosis not present

## 2015-07-12 DIAGNOSIS — C50412 Malignant neoplasm of upper-outer quadrant of left female breast: Secondary | ICD-10-CM

## 2015-07-12 NOTE — Progress Notes (Signed)
Weekly rad txs left breast/chest wall, 10/33 completed, no skin changes, using radiaplex bid, has sick Friday and missed, had diarrhea,  Took imodium and resolvd, feels a lot better today 2:20 PM BP 152/69 mmHg  Pulse 91  Temp(Src) 97.7 F (36.5 C) (Oral)  Resp 20  Wt 101 lb 12.8 oz (46.176 kg)  LMP 02/28/1999  Wt Readings from Last 3 Encounters:  07/12/15 101 lb 12.8 oz (46.176 kg)  07/02/15 101 lb 12.8 oz (46.176 kg)  05/18/15 105 lb (47.628 kg)

## 2015-07-12 NOTE — Progress Notes (Addendum)
Department of Radiation Oncology  Phone:  [phone] Fax:        [phone]  Weekly Treatment Note   Name: Sherry Bender Date: 07/12/2015 MRN: [id number] DOB: January 13, 1951   Diagnosis:     ICD-9-CM ICD-10-CM   1. Breast cancer of upper-outer quadrant of left female breast ([place]) 174.4 C50.412      Current dose: 18 Gy  Current fraction:10   MEDICATIONS: Current Outpatient Prescriptions  Medication Sig Dispense Refill  . dexlansoprazole (DEXILANT) 60 MG capsule Take 1 capsule (60 mg total) by mouth daily. 30 capsule 4  . diazepam (VALIUM) 5 MG tablet Take 1 tablet (5 mg total) by mouth every 6 (six) hours as needed for anxiety. 30 tablet 0  . esomeprazole (NEXIUM) 40 MG packet Take 40 mg by mouth daily before breakfast. 30 each 2  . estradiol (ESTRACE) 1 MG tablet Take 1 mg by mouth every other day.    . hyaluronate sodium (RADIAPLEXRX) GEL Apply 1 application topically 2 (two) times daily.    . hydrochlorothiazide (HYDRODIURIL) 25 MG tablet Take 1 tablet (25 mg total) by mouth daily. 90 tablet 0  . Hydrocodone-Acetaminophen (VICODIN) 5-300 MG TABS Take 1 tablet by mouth every 6 (six) hours. 30 each 0  . methocarbamol (ROBAXIN) 500 MG tablet TAKE 1 TABLET BY MOUTH EVERY 8 HOURS FOR MUSCLE SPASMS  0  . non-metallic deodorant (ALRA) MISC Apply 1 application topically daily as needed.    . ondansetron (ZOFRAN) 4 MG tablet Take 4 mg by mouth every 6 (six) hours as needed. for nausea  0  . potassium chloride (MICRO-K) 10 MEQ CR capsule Take 1 capsule (10 mEq total) by mouth daily. 30 capsule 1   No current facility-administered medications for this encounter.     ALLERGIES: Codeine   LABORATORY DATA:  Lab Results  Component Value Date   WBC 7.7 04/30/2015   HGB 9.8* 04/30/2015   HCT 30.9* 04/30/2015   MCV 85.8 04/30/2015   PLT 166 04/30/2015   Lab Results  Component Value Date   NA 133* 04/22/2015   K 3.2* 04/22/2015   CL 94* 04/22/2015   CO2 30 04/22/2015    Lab Results  Component Value Date   ALT 24 04/22/2015   AST 34 04/22/2015   ALKPHOS 64 04/22/2015   BILITOT 0.3 04/22/2015    NARRATIVE: Sherry Bender was seen today for weekly treatment management. The chart was checked and the patient's films were reviewed.  Weekly rad txs left breast/chest wall, 10/33 completed, no skin changes, using radiaplex bid, was sick Friday and missed, had diarrhea,  Took imodium and resolved, feels a lot better today  PHYSICAL EXAMINATION: weight is 101 lb 12.8 oz (46.176 kg). Her oral temperature is 97.7 F (36.5 C). Her blood pressure is 152/69 and her pulse is 91. Her respiration is 20.       Pain scale 0/10 In general this is a well appearing caucasian female in no acute distress. She is alert and oriented x4 and appropriate throughout the entire encounter. Her left chest wall is intact without evidence of ulceration or skin breakdown.   ASSESSMENT: The patient is doing satisfactorily with treatment.  PLAN: We will proceed as outlined and see her back Friday for her next undertreat visit.  Jodelle Gross, MD  This document serves as a record of services personally performed by Kyung Rudd, MD. It was created on his behalf by Derek Mound, a trained medical scribe. The creation of this record  is based on the scribe's personal observations and the provider's statements to them. This document has been checked and approved by the attending provider.

## 2015-07-13 ENCOUNTER — Ambulatory Visit
Admission: RE | Admit: 2015-07-13 | Discharge: 2015-07-13 | Disposition: A | Discharge status: Home / Self Care | Payer: Managed Care, Other (non HMO) | Source: Ambulatory Visit | Attending: Radiation Oncology | Admitting: Radiation Oncology

## 2015-07-13 DIAGNOSIS — Z510 Encounter for antineoplastic radiation therapy: Secondary | ICD-10-CM | POA: Diagnosis not present

## 2015-07-14 ENCOUNTER — Ambulatory Visit
Admission: RE | Admit: 2015-07-14 | Discharge: 2015-07-14 | Disposition: A | Discharge status: Home / Self Care | Payer: Managed Care, Other (non HMO) | Source: Ambulatory Visit | Attending: Radiation Oncology | Admitting: Radiation Oncology

## 2015-07-14 DIAGNOSIS — Z510 Encounter for antineoplastic radiation therapy: Secondary | ICD-10-CM | POA: Diagnosis not present

## 2015-07-15 ENCOUNTER — Ambulatory Visit
Admission: RE | Admit: 2015-07-15 | Discharge: 2015-07-15 | Disposition: A | Discharge status: Home / Self Care | Payer: Managed Care, Other (non HMO) | Source: Ambulatory Visit | Attending: Radiation Oncology | Admitting: Radiation Oncology

## 2015-07-15 DIAGNOSIS — Z510 Encounter for antineoplastic radiation therapy: Secondary | ICD-10-CM | POA: Diagnosis not present

## 2015-07-16 ENCOUNTER — Ambulatory Visit
Admission: RE | Admit: 2015-07-16 | Discharge: 2015-07-16 | Disposition: A | Discharge status: Home / Self Care | Payer: Managed Care, Other (non HMO) | Source: Ambulatory Visit | Attending: Radiation Oncology | Admitting: Radiation Oncology

## 2015-07-16 ENCOUNTER — Encounter: Payer: Self-pay | Admitting: Radiation Oncology

## 2015-07-16 VITALS — BP 147/59 | HR 83 | Temp 97.8°F | Resp 20 | Wt 107.6 lb

## 2015-07-16 DIAGNOSIS — C50412 Malignant neoplasm of upper-outer quadrant of left female breast: Secondary | ICD-10-CM

## 2015-07-16 DIAGNOSIS — Z510 Encounter for antineoplastic radiation therapy: Secondary | ICD-10-CM | POA: Diagnosis not present

## 2015-07-16 MED ORDER — SONAFINE EX EMUL
1.0000 "application " | Freq: Two times a day (BID) | CUTANEOUS | Status: DC
  Administered 2015-07-16: 1 via TOPICAL
  Filled 2015-07-16: qty 45

## 2015-07-16 NOTE — Progress Notes (Signed)
weekly radiation left chest wall/breast area,14/33 completed,  mild erythema and starting of dermatitis,c/o itching a lot, will give sonafine cream to start using ,has radiaplex  , no other c/o 1:32 PM BP 147/59 mmHg  Pulse 83  Temp(Src) 97.8 F (36.6 C) (Oral)  Resp 20  Wt 107 lb 9.6 oz (48.807 kg)  LMP 02/28/1999  Wt Readings from Last 3 Encounters:  07/16/15 107 lb 9.6 oz (48.807 kg)  07/12/15 101 lb 12.8 oz (46.176 kg)  07/02/15 101 lb 12.8 oz (46.176 kg)

## 2015-07-18 NOTE — Progress Notes (Signed)
Department of Radiation Oncology  Phone:  [phone] Fax:        [phone]  Weekly Treatment Note    Name: Sherry Bender Date: 07/18/2015 MRN: [id number] DOB: 06-Oct-1950   Diagnosis:     ICD-9-CM ICD-10-CM   1. Breast cancer of upper-outer quadrant of left female breast (HCC) 174.4 C50.412 DISCONTINUED: SONAFINE emulsion 1 application     Current dose: 25.2 Gy  Current fraction: 14   MEDICATIONS: Current Outpatient Prescriptions  Medication Sig Dispense Refill  . dexlansoprazole (DEXILANT) 60 MG capsule Take 1 capsule (60 mg total) by mouth daily. 30 capsule 4  . diazepam (VALIUM) 5 MG tablet Take 1 tablet (5 mg total) by mouth every 6 (six) hours as needed for anxiety. 30 tablet 0  . esomeprazole (NEXIUM) 40 MG packet Take 40 mg by mouth daily before breakfast. 30 each 2  . estradiol (ESTRACE) 1 MG tablet Take 1 mg by mouth every other day.    . hyaluronate sodium (RADIAPLEXRX) GEL Apply 1 application topically 2 (two) times daily.    . hydrochlorothiazide (HYDRODIURIL) 25 MG tablet Take 1 tablet (25 mg total) by mouth daily. 90 tablet 0  . Hydrocodone-Acetaminophen (VICODIN) 5-300 MG TABS Take 1 tablet by mouth every 6 (six) hours. 30 each 0  . methocarbamol (ROBAXIN) 500 MG tablet TAKE 1 TABLET BY MOUTH EVERY 8 HOURS FOR MUSCLE SPASMS  0  . non-metallic deodorant (ALRA) MISC Apply 1 application topically daily as needed.    . ondansetron (ZOFRAN) 4 MG tablet Take 4 mg by mouth every 6 (six) hours as needed. for nausea  0  . potassium chloride (MICRO-K) 10 MEQ CR capsule Take 1 capsule (10 mEq total) by mouth daily. 30 capsule 1  . Wound Dressings (SONAFINE) Apply 1 application topically 2 (two) times daily.     No current facility-administered medications for this encounter.     ALLERGIES: Codeine   LABORATORY DATA:  Lab Results  Component Value Date   WBC 7.7 04/30/2015   HGB 9.8* 04/30/2015   HCT 30.9* 04/30/2015   MCV 85.8 04/30/2015   PLT 166  04/30/2015   Lab Results  Component Value Date   NA 133* 04/22/2015   K 3.2* 04/22/2015   CL 94* 04/22/2015   CO2 30 04/22/2015   Lab Results  Component Value Date   ALT 24 04/22/2015   AST 34 04/22/2015   ALKPHOS 64 04/22/2015   BILITOT 0.3 04/22/2015     NARRATIVE: Sherry Bender was seen today for weekly treatment management. The chart was checked and the patient's films were reviewed.  weekly radiation left chest wall/breast area,14/33 completed,  mild erythema and starting of dermatitis,c/o itching a lot, will give sonafine cream to start using ,has radiaplex  , no other c/o 11:40 AM BP 147/59 mmHg  Pulse 83  Temp(Src) 97.8 F (36.6 C) (Oral)  Resp 20  Wt 107 lb 9.6 oz (48.807 kg)  LMP 02/28/1999  Wt Readings from Last 3 Encounters:  07/16/15 107 lb 9.6 oz (48.807 kg)  07/12/15 101 lb 12.8 oz (46.176 kg)  07/02/15 101 lb 12.8 oz (46.176 kg)    PHYSICAL EXAMINATION: weight is 107 lb 9.6 oz (48.807 kg). Her oral temperature is 97.8 F (36.6 C). Her blood pressure is 147/59 and her pulse is 83. Her respiration is 20.      patient's skin shows no significant desquamation  ASSESSMENT: The patient is doing satisfactorily with treatment.  PLAN: We will continue with the patient's  radiation treatment as planned.

## 2015-07-19 ENCOUNTER — Ambulatory Visit
Admission: RE | Admit: 2015-07-19 | Discharge: 2015-07-19 | Disposition: A | Discharge status: Home / Self Care | Payer: Managed Care, Other (non HMO) | Source: Ambulatory Visit | Attending: Radiation Oncology | Admitting: Radiation Oncology

## 2015-07-19 DIAGNOSIS — Z510 Encounter for antineoplastic radiation therapy: Secondary | ICD-10-CM | POA: Diagnosis not present

## 2015-07-20 ENCOUNTER — Ambulatory Visit
Admission: RE | Admit: 2015-07-20 | Discharge: 2015-07-20 | Disposition: A | Discharge status: Home / Self Care | Payer: Managed Care, Other (non HMO) | Source: Ambulatory Visit | Attending: Radiation Oncology | Admitting: Radiation Oncology

## 2015-07-20 DIAGNOSIS — Z510 Encounter for antineoplastic radiation therapy: Secondary | ICD-10-CM | POA: Diagnosis not present

## 2015-07-21 ENCOUNTER — Encounter: Payer: Self-pay | Admitting: Radiation Oncology

## 2015-07-21 ENCOUNTER — Ambulatory Visit
Admission: RE | Admit: 2015-07-21 | Discharge: 2015-07-21 | Disposition: A | Discharge status: Home / Self Care | Payer: Managed Care, Other (non HMO) | Source: Ambulatory Visit | Attending: Radiation Oncology | Admitting: Radiation Oncology

## 2015-07-21 DIAGNOSIS — Z510 Encounter for antineoplastic radiation therapy: Secondary | ICD-10-CM | POA: Diagnosis not present

## 2015-07-22 ENCOUNTER — Ambulatory Visit
Admission: RE | Admit: 2015-07-22 | Discharge: 2015-07-22 | Disposition: A | Discharge status: Home / Self Care | Payer: Managed Care, Other (non HMO) | Source: Ambulatory Visit | Attending: Radiation Oncology | Admitting: Radiation Oncology

## 2015-07-22 DIAGNOSIS — Z510 Encounter for antineoplastic radiation therapy: Secondary | ICD-10-CM | POA: Diagnosis not present

## 2015-07-23 ENCOUNTER — Ambulatory Visit
Admission: RE | Admit: 2015-07-23 | Discharge: 2015-07-23 | Disposition: A | Discharge status: Home / Self Care | Payer: Managed Care, Other (non HMO) | Source: Ambulatory Visit | Attending: Radiation Oncology | Admitting: Radiation Oncology

## 2015-07-23 ENCOUNTER — Encounter: Payer: Self-pay | Admitting: Radiation Oncology

## 2015-07-23 VITALS — BP 117/51 | HR 80 | Temp 98.9°F | Resp 20 | Wt 98.8 lb

## 2015-07-23 DIAGNOSIS — Z510 Encounter for antineoplastic radiation therapy: Secondary | ICD-10-CM | POA: Diagnosis not present

## 2015-07-23 DIAGNOSIS — C50412 Malignant neoplasm of upper-outer quadrant of left female breast: Secondary | ICD-10-CM

## 2015-07-23 NOTE — Progress Notes (Signed)
Week rad tx  Left chest wall/breast, rash on front and subclavicular region and on top of left back,using sonafine cream bid, c/o still itching, and has been using bactine  For the itching, reinforced not to put cream  4 hours before coming for rad txs , she put sonafine cream on before treatment, appetite, okay, energy level is 6/7 on 10 scale   2:02 PM BP 117/51 mmHg  Pulse 80  Temp(Src) 98.9 F (37.2 C) (Oral)  Resp 20  Wt 98 lb 12.8 oz (44.815 kg)  LMP 02/28/1999  Wt Readings from Last 3 Encounters:  07/23/15 98 lb 12.8 oz (44.815 kg)  07/16/15 107 lb 9.6 oz (48.807 kg)  07/12/15 101 lb 12.8 oz (46.176 kg)

## 2015-07-23 NOTE — Progress Notes (Signed)
Department of Radiation Oncology  Phone:  [phone] Fax:        [phone]  Weekly Treatment Note    Name: Sherry Bender Date: 07/23/2015 MRN: [id number] DOB: 27-Sep-1950   Diagnosis:     ICD-9-CM ICD-10-CM   1. Breast cancer of upper-outer quadrant of left female breast ([facility]) 174.4 C50.412      Current dose: 34.2 Gy  Current fraction: 19   MEDICATIONS: Current Outpatient Prescriptions  Medication Sig Dispense Refill  . dexlansoprazole (DEXILANT) 60 MG capsule Take 1 capsule (60 mg total) by mouth daily. 30 capsule 4  . diazepam (VALIUM) 5 MG tablet Take 1 tablet (5 mg total) by mouth every 6 (six) hours as needed for anxiety. 30 tablet 0  . esomeprazole (NEXIUM) 40 MG packet Take 40 mg by mouth daily before breakfast. 30 each 2  . estradiol (ESTRACE) 1 MG tablet Take 1 mg by mouth every other day.    . hyaluronate sodium (RADIAPLEXRX) GEL Apply 1 application topically 2 (two) times daily.    . hydrochlorothiazide (HYDRODIURIL) 25 MG tablet Take 1 tablet (25 mg total) by mouth daily. 90 tablet 0  . Hydrocodone-Acetaminophen (VICODIN) 5-300 MG TABS Take 1 tablet by mouth every 6 (six) hours. 30 each 0  . methocarbamol (ROBAXIN) 500 MG tablet TAKE 1 TABLET BY MOUTH EVERY 8 HOURS FOR MUSCLE SPASMS  0  . non-metallic deodorant (ALRA) MISC Apply 1 application topically daily as needed.    . ondansetron (ZOFRAN) 4 MG tablet Take 4 mg by mouth every 6 (six) hours as needed. for nausea  0  . potassium chloride (MICRO-K) 10 MEQ CR capsule Take 1 capsule (10 mEq total) by mouth daily. 30 capsule 1  . Wound Dressings (SONAFINE) Apply 1 application topically 2 (two) times daily.     No current facility-administered medications for this encounter.     ALLERGIES: Codeine   LABORATORY DATA:  Lab Results  Component Value Date   WBC 7.7 04/30/2015   HGB 9.8* 04/30/2015   HCT 30.9* 04/30/2015   MCV 85.8 04/30/2015   PLT 166 04/30/2015   Lab Results  Component Value Date   NA 133* 04/22/2015   K 3.2* 04/22/2015   CL 94* 04/22/2015   CO2 30 04/22/2015   Lab Results  Component Value Date   ALT 24 04/22/2015   AST 34 04/22/2015   ALKPHOS 64 04/22/2015   BILITOT 0.3 04/22/2015     NARRATIVE: Sherry Bender was seen today for weekly treatment management. The chart was checked and the patient's films were reviewed.  Week rad tx  Left chest wall/breast, rash on front and subclavicular region and on top of left back,using sonafine cream bid, c/o still itching, and has been using bactine  For the itching, reinforced not to put cream  4 hours before coming for rad txs , she put sonafine cream on before treatment, appetite, okay, energy level is 6/7 on 10 scale   7:35 PM BP 117/51 mmHg  Pulse 80  Temp(Src) 98.9 F (37.2 C) (Oral)  Resp 20  Wt 98 lb 12.8 oz (44.815 kg)  LMP 02/28/1999  Wt Readings from Last 3 Encounters:  07/23/15 98 lb 12.8 oz (44.815 kg)  07/16/15 107 lb 9.6 oz (48.807 kg)  07/12/15 101 lb 12.8 oz (46.176 kg)    PHYSICAL EXAMINATION: weight is 98 lb 12.8 oz (44.815 kg). Her oral temperature is 98.9 F (37.2 C). Her blood pressure is 117/51 and her pulse is 80. Her respiration  is 20.      the patient's skin shows some moderate radiation effect. No desquamation. Overall looks good.  ASSESSMENT: The patient is doing satisfactorily with treatment.  PLAN: We will continue with the patient's radiation treatment as planned.

## 2015-07-26 ENCOUNTER — Ambulatory Visit
Admission: RE | Admit: 2015-07-26 | Discharge: 2015-07-26 | Disposition: A | Discharge status: Home / Self Care | Payer: Managed Care, Other (non HMO) | Source: Ambulatory Visit | Attending: Radiation Oncology | Admitting: Radiation Oncology

## 2015-07-26 DIAGNOSIS — Z510 Encounter for antineoplastic radiation therapy: Secondary | ICD-10-CM | POA: Diagnosis not present

## 2015-07-27 ENCOUNTER — Ambulatory Visit
Admission: RE | Admit: 2015-07-27 | Discharge: 2015-07-27 | Disposition: A | Discharge status: Home / Self Care | Payer: Managed Care, Other (non HMO) | Source: Ambulatory Visit | Attending: Radiation Oncology | Admitting: Radiation Oncology

## 2015-07-27 ENCOUNTER — Ambulatory Visit: Payer: Managed Care, Other (non HMO)

## 2015-07-27 DIAGNOSIS — Z510 Encounter for antineoplastic radiation therapy: Secondary | ICD-10-CM | POA: Diagnosis not present

## 2015-07-28 ENCOUNTER — Ambulatory Visit
Admission: RE | Admit: 2015-07-28 | Discharge: 2015-07-28 | Disposition: A | Discharge status: Home / Self Care | Payer: Managed Care, Other (non HMO) | Source: Ambulatory Visit | Attending: Radiation Oncology | Admitting: Radiation Oncology

## 2015-07-28 DIAGNOSIS — Z510 Encounter for antineoplastic radiation therapy: Secondary | ICD-10-CM | POA: Diagnosis not present

## 2015-07-28 MED FILL — DEXILANT DR 60 MG CAPSULE: 60 | 30 days supply | Qty: 30 | Fill #2

## 2015-07-29 ENCOUNTER — Ambulatory Visit
Admission: RE | Admit: 2015-07-29 | Discharge: 2015-07-29 | Disposition: A | Discharge status: Home / Self Care | Payer: Managed Care, Other (non HMO) | Source: Ambulatory Visit | Attending: Radiation Oncology | Admitting: Radiation Oncology

## 2015-07-29 DIAGNOSIS — Z510 Encounter for antineoplastic radiation therapy: Secondary | ICD-10-CM | POA: Diagnosis not present

## 2015-07-30 ENCOUNTER — Encounter: Payer: Self-pay | Admitting: Radiation Oncology

## 2015-07-30 ENCOUNTER — Ambulatory Visit
Admission: RE | Admit: 2015-07-30 | Discharge: 2015-07-30 | Disposition: A | Discharge status: Home / Self Care | Payer: Managed Care, Other (non HMO) | Source: Ambulatory Visit | Attending: Radiation Oncology | Admitting: Radiation Oncology

## 2015-07-30 ENCOUNTER — Ambulatory Visit: Payer: Managed Care, Other (non HMO)

## 2015-07-30 VITALS — BP 136/88 | HR 88 | Temp 97.8°F | Resp 20 | Wt 101.3 lb

## 2015-07-30 DIAGNOSIS — C50412 Malignant neoplasm of upper-outer quadrant of left female breast: Secondary | ICD-10-CM

## 2015-07-30 DIAGNOSIS — Z510 Encounter for antineoplastic radiation therapy: Secondary | ICD-10-CM | POA: Diagnosis not present

## 2015-07-30 MED ORDER — SONAFINE EX EMUL
1.0000 "application " | Freq: Two times a day (BID) | CUTANEOUS | Status: DC
  Administered 2015-07-30: 1 via TOPICAL
  Filled 2015-07-30: qty 45

## 2015-07-30 NOTE — Progress Notes (Signed)
Weekly rad txs left chest wall/subclavicular area 24/33 txs completd, rashy angry red skin colored, c/o hurting at subclavicular area, using sonafine cream 2-3x day, gave another tube,  Appetite good, left arm hurting yesterday after going home, took tyleol and pain med 2:12 PM BP 136/88 mmHg  Pulse 88  Temp(Src) 97.8 F (36.6 C) (Oral)  Resp 20  Wt 101 lb 4.8 oz (45.949 kg)  LMP 02/28/1999  Wt Readings from Last 3 Encounters:  07/30/15 101 lb 4.8 oz (45.949 kg)  07/23/15 98 lb 12.8 oz (44.815 kg)  07/16/15 107 lb 9.6 oz (48.807 kg)

## 2015-07-30 NOTE — Progress Notes (Signed)
Department of Radiation Oncology  Phone:  [phone] Fax:        [phone]  Weekly Treatment Note    Name: Sherry Bender Date: 07/30/2015 MRN: [id number] DOB: 11-16-1950   Diagnosis:     ICD-9-CM ICD-10-CM   1. Breast cancer of upper-outer quadrant of left female breast (HCC) 174.4 C50.412 SONAFINE emulsion 1 application     Current dose: 43.2 Gy  Current fraction: 24   MEDICATIONS: Current Outpatient Prescriptions  Medication Sig Dispense Refill  . dexlansoprazole (DEXILANT) 60 MG capsule Take 1 capsule (60 mg total) by mouth daily. 30 capsule 4  . diazepam (VALIUM) 5 MG tablet Take 1 tablet (5 mg total) by mouth every 6 (six) hours as needed for anxiety. 30 tablet 0  . esomeprazole (NEXIUM) 40 MG packet Take 40 mg by mouth daily before breakfast. 30 each 2  . estradiol (ESTRACE) 1 MG tablet Take 1 mg by mouth every other day.    . hyaluronate sodium (RADIAPLEXRX) GEL Apply 1 application topically 2 (two) times daily.    . hydrochlorothiazide (HYDRODIURIL) 25 MG tablet Take 1 tablet (25 mg total) by mouth daily. 90 tablet 0  . Hydrocodone-Acetaminophen (VICODIN) 5-300 MG TABS Take 1 tablet by mouth every 6 (six) hours. 30 each 0  . methocarbamol (ROBAXIN) 500 MG tablet TAKE 1 TABLET BY MOUTH EVERY 8 HOURS FOR MUSCLE SPASMS  0  . non-metallic deodorant (ALRA) MISC Apply 1 application topically daily as needed.    . ondansetron (ZOFRAN) 4 MG tablet Take 4 mg by mouth every 6 (six) hours as needed. for nausea  0  . potassium chloride (MICRO-K) 10 MEQ CR capsule Take 1 capsule (10 mEq total) by mouth daily. 30 capsule 1  . Wound Dressings (SONAFINE) Apply 1 application topically 2 (two) times daily.     Current Facility-Administered Medications  Medication Dose Route Frequency Provider Last Rate Last Dose  . SONAFINE emulsion 1 application  1 application Topical BID Hayden Pedro, [place]   1 application at [id number]     ALLERGIES: Codeine   LABORATORY  DATA:  Lab Results  Component Value Date   WBC 7.7 04/30/2015   HGB 9.8* 04/30/2015   HCT 30.9* 04/30/2015   MCV 85.8 04/30/2015   PLT 166 04/30/2015   Lab Results  Component Value Date   NA 133* 04/22/2015   K 3.2* 04/22/2015   CL 94* 04/22/2015   CO2 30 04/22/2015   Lab Results  Component Value Date   ALT 24 04/22/2015   AST 34 04/22/2015   ALKPHOS 64 04/22/2015   BILITOT 0.3 04/22/2015     NARRATIVE: Sherry Bender was seen today for weekly treatment management. The chart was checked and the patient's films were reviewed.  Weekly rad txs left chest wall/subclavicular area 24/33 txs completd, rashy angry red skin colored, c/o hurting at subclavicular area, using sonafine cream 2-3x day, gave another tube,  Appetite good, left arm hurting yesterday after going home, took tyleol and pain med 6:37 PM BP 136/88 mmHg  Pulse 88  Temp(Src) 97.8 F (36.6 C) (Oral)  Resp 20  Wt 101 lb 4.8 oz (45.949 kg)  LMP 02/28/1999  Wt Readings from Last 3 Encounters:  07/30/15 101 lb 4.8 oz (45.949 kg)  07/23/15 98 lb 12.8 oz (44.815 kg)  07/16/15 107 lb 9.6 oz (48.807 kg)    PHYSICAL EXAMINATION: weight is 101 lb 4.8 oz (45.949 kg). Her oral temperature is 97.8 F (36.6 C). Her blood pressure  is 136/88 and her pulse is 88. Her respiration is 20.      moderate dermatitis present in the treatment area.  ASSESSMENT: The patient is doing satisfactorily with treatment.  PLAN: We will continue with the patient's radiation treatment as planned.

## 2015-08-02 ENCOUNTER — Ambulatory Visit: Payer: Managed Care, Other (non HMO)

## 2015-08-02 ENCOUNTER — Ambulatory Visit
Admission: RE | Admit: 2015-08-02 | Discharge: 2015-08-02 | Disposition: A | Discharge status: Home / Self Care | Payer: Managed Care, Other (non HMO) | Source: Ambulatory Visit | Attending: Radiation Oncology | Admitting: Radiation Oncology

## 2015-08-02 DIAGNOSIS — Z510 Encounter for antineoplastic radiation therapy: Secondary | ICD-10-CM | POA: Diagnosis not present

## 2015-08-03 ENCOUNTER — Ambulatory Visit: Payer: Managed Care, Other (non HMO)

## 2015-08-03 ENCOUNTER — Ambulatory Visit
Admission: RE | Admit: 2015-08-03 | Discharge: 2015-08-03 | Disposition: A | Discharge status: Home / Self Care | Payer: Managed Care, Other (non HMO) | Source: Ambulatory Visit | Attending: Radiation Oncology | Admitting: Radiation Oncology

## 2015-08-03 DIAGNOSIS — Z510 Encounter for antineoplastic radiation therapy: Secondary | ICD-10-CM | POA: Diagnosis not present

## 2015-08-04 ENCOUNTER — Ambulatory Visit: Payer: Managed Care, Other (non HMO)

## 2015-08-04 ENCOUNTER — Ambulatory Visit
Admission: RE | Admit: 2015-08-04 | Discharge: 2015-08-04 | Disposition: A | Discharge status: Home / Self Care | Payer: Managed Care, Other (non HMO) | Source: Ambulatory Visit | Attending: Radiation Oncology | Admitting: Radiation Oncology

## 2015-08-04 DIAGNOSIS — Z510 Encounter for antineoplastic radiation therapy: Secondary | ICD-10-CM | POA: Diagnosis not present

## 2015-08-05 ENCOUNTER — Ambulatory Visit: Payer: Managed Care, Other (non HMO)

## 2015-08-05 ENCOUNTER — Ambulatory Visit: Payer: Managed Care, Other (non HMO) | Admitting: Radiation Oncology

## 2015-08-05 DIAGNOSIS — Z510 Encounter for antineoplastic radiation therapy: Secondary | ICD-10-CM | POA: Diagnosis not present

## 2015-08-06 ENCOUNTER — Ambulatory Visit
Admission: RE | Admit: 2015-08-06 | Discharge: 2015-08-06 | Disposition: A | Discharge status: Home / Self Care | Payer: Managed Care, Other (non HMO) | Source: Ambulatory Visit | Attending: Radiation Oncology | Admitting: Radiation Oncology

## 2015-08-06 ENCOUNTER — Ambulatory Visit: Payer: Managed Care, Other (non HMO)

## 2015-08-06 ENCOUNTER — Encounter: Payer: Self-pay | Admitting: Radiation Oncology

## 2015-08-06 VITALS — BP 159/68 | HR 83 | Temp 98.4°F | Resp 16 | Ht 66.0 in | Wt 99.4 lb

## 2015-08-06 DIAGNOSIS — C50412 Malignant neoplasm of upper-outer quadrant of left female breast: Secondary | ICD-10-CM

## 2015-08-06 DIAGNOSIS — Z510 Encounter for antineoplastic radiation therapy: Secondary | ICD-10-CM | POA: Diagnosis not present

## 2015-08-06 NOTE — Progress Notes (Signed)
Sherry Bender has completed 29 fractions to her left chest wall.  She reports having burning in her left subclavian area from skin irritation.  She reports having fatigue.  She is using sonafine 3- 4 times per day.  The skin on her left upper back, subclavian area and chest is red.  The subclavian area is peeling.  She reports she is also putting neosporin on the peeling areas.  BP 159/68 mmHg  Pulse 83  Temp(Src) 98.4 F (36.9 C) (Oral)  Resp 16  Ht 5\' 6"  (1.676 m)  Wt 99 lb 6.4 oz (45.088 kg)  BMI 16.05 kg/m2  LMP 02/28/1999

## 2015-08-08 NOTE — Progress Notes (Signed)
Department of Radiation Oncology  Phone:  [phone] Fax:        [phone]  Weekly Treatment Note    Name: Sherry Bender Date: 08/08/2015 MRN: [id number] DOB: 1951-06-04   Diagnosis:     ICD-9-CM ICD-10-CM   1. Breast cancer of upper-outer quadrant of left female breast ([facility]) 174.4 C50.412      Current dose: 52.4 Gy  Current fraction: 29   MEDICATIONS: Current Outpatient Prescriptions  Medication Sig Dispense Refill  . dexlansoprazole (DEXILANT) 60 MG capsule Take 1 capsule (60 mg total) by mouth daily. 30 capsule 4  . diazepam (VALIUM) 5 MG tablet Take 1 tablet (5 mg total) by mouth every 6 (six) hours as needed for anxiety. 30 tablet 0  . estradiol (ESTRACE) 1 MG tablet Take 1 mg by mouth every other day.    . hydrochlorothiazide (HYDRODIURIL) 25 MG tablet Take 1 tablet (25 mg total) by mouth daily. 90 tablet 0  . Hydrocodone-Acetaminophen (VICODIN) 5-300 MG TABS Take 1 tablet by mouth every 6 (six) hours. 30 each 0  . non-metallic deodorant (ALRA) MISC Apply 1 application topically daily as needed.    . Wound Dressings (SONAFINE) Apply 1 application topically 2 (two) times daily.    Marland Kitchen esomeprazole (NEXIUM) 40 MG packet Take 40 mg by mouth daily before breakfast. (Patient not taking: Reported on 08/06/2015) 30 each 2  . hyaluronate sodium (RADIAPLEXRX) GEL Apply 1 application topically 2 (two) times daily. Reported on 08/06/2015    . methocarbamol (ROBAXIN) 500 MG tablet Reported on 08/06/2015  0  . ondansetron (ZOFRAN) 4 MG tablet Take 4 mg by mouth every 6 (six) hours as needed. Reported on 08/06/2015  0   No current facility-administered medications for this encounter.     ALLERGIES: Codeine   LABORATORY DATA:  Lab Results  Component Value Date   WBC 7.7 04/30/2015   HGB 9.8* 04/30/2015   HCT 30.9* 04/30/2015   MCV 85.8 04/30/2015   PLT 166 04/30/2015   Lab Results  Component Value Date   NA 133* 04/22/2015   K 3.2* 04/22/2015   CL 94* 04/22/2015   CO2 30 04/22/2015   Lab Results  Component Value Date   ALT 24 04/22/2015   AST 34 04/22/2015   ALKPHOS 64 04/22/2015   BILITOT 0.3 04/22/2015     NARRATIVE: [name] was seen today for weekly treatment management. The chart was checked and the patient's films were reviewed.  [name] has completed 29 fractions to her left chest wall.  She reports having burning in her left subclavian area from skin irritation.  She reports having fatigue.  She is using sonafine 3- 4 times per day.  The skin on her left upper back, subclavian area and chest is red.  The subclavian area is peeling.  She reports she is also putting neosporin on the peeling areas.  BP 159/68 mmHg  Pulse 83  Temp(Src) 98.4 F (36.9 C) (Oral)  Resp 16  Ht 5\' 6"  (1.676 m)  Wt 99 lb 6.4 oz (45.088 kg)  BMI 16.05 kg/m2  LMP 02/28/1999  PHYSICAL EXAMINATION: height is 5\' 6"  (1.676 m) and weight is 99 lb 6.4 oz (45.088 kg). Her oral temperature is 98.4 F (36.9 C). Her blood pressure is 159/68 and her pulse is 83. Her respiration is 16.      the patient's skin looks quite good for where she is in her treatment. No significant desquamation.  ASSESSMENT: The patient is doing satisfactorily with  treatment.  PLAN: We will continue with the patient's radiation treatment as planned.

## 2015-08-09 ENCOUNTER — Ambulatory Visit
Admission: RE | Admit: 2015-08-09 | Discharge: 2015-08-09 | Disposition: A | Discharge status: Home / Self Care | Payer: Managed Care, Other (non HMO) | Source: Ambulatory Visit | Attending: Radiation Oncology | Admitting: Radiation Oncology

## 2015-08-09 ENCOUNTER — Ambulatory Visit: Payer: Managed Care, Other (non HMO)

## 2015-08-09 DIAGNOSIS — Z510 Encounter for antineoplastic radiation therapy: Secondary | ICD-10-CM | POA: Diagnosis not present

## 2015-08-10 ENCOUNTER — Ambulatory Visit
Admission: RE | Admit: 2015-08-10 | Discharge: 2015-08-10 | Disposition: A | Discharge status: Home / Self Care | Payer: Managed Care, Other (non HMO) | Source: Ambulatory Visit | Attending: Radiation Oncology | Admitting: Radiation Oncology

## 2015-08-10 ENCOUNTER — Ambulatory Visit: Payer: Managed Care, Other (non HMO)

## 2015-08-10 DIAGNOSIS — C50412 Malignant neoplasm of upper-outer quadrant of left female breast: Secondary | ICD-10-CM | POA: Insufficient documentation

## 2015-08-10 DIAGNOSIS — Z171 Estrogen receptor negative status [ER-]: Secondary | ICD-10-CM | POA: Diagnosis not present

## 2015-08-10 DIAGNOSIS — Z510 Encounter for antineoplastic radiation therapy: Secondary | ICD-10-CM | POA: Insufficient documentation

## 2015-08-11 ENCOUNTER — Ambulatory Visit
Admission: RE | Admit: 2015-08-11 | Discharge: 2015-08-11 | Disposition: A | Discharge status: Home / Self Care | Payer: Managed Care, Other (non HMO) | Source: Ambulatory Visit | Attending: Radiation Oncology | Admitting: Radiation Oncology

## 2015-08-11 ENCOUNTER — Ambulatory Visit: Payer: Managed Care, Other (non HMO)

## 2015-08-11 DIAGNOSIS — C50412 Malignant neoplasm of upper-outer quadrant of left female breast: Secondary | ICD-10-CM

## 2015-08-11 DIAGNOSIS — Z510 Encounter for antineoplastic radiation therapy: Secondary | ICD-10-CM | POA: Diagnosis not present

## 2015-08-11 NOTE — Progress Notes (Signed)
Department of Radiation Oncology  Phone:  [phone] Fax:        [phone]  Weekly Treatment Note    Name: Sherry Bender Date: 08/11/2015 MRN: [id number] DOB: 1950-10-01   Diagnosis: Breast cancer of upper-outer quadrant of left female breast ([facility])   Current dose: 58.4 Gy  Current fraction: 32   MEDICATIONS: Current Outpatient Prescriptions  Medication Sig Dispense Refill  . dexlansoprazole (DEXILANT) 60 MG capsule Take 1 capsule (60 mg total) by mouth daily. 30 capsule 4  . diazepam (VALIUM) 5 MG tablet Take 1 tablet (5 mg total) by mouth every 6 (six) hours as needed for anxiety. 30 tablet 0  . esomeprazole (NEXIUM) 40 MG packet Take 40 mg by mouth daily before breakfast. (Patient not taking: Reported on 08/06/2015) 30 each 2  . estradiol (ESTRACE) 1 MG tablet Take 1 mg by mouth every other day.    . hyaluronate sodium (RADIAPLEXRX) GEL Apply 1 application topically 2 (two) times daily. Reported on 08/06/2015    . hydrochlorothiazide (HYDRODIURIL) 25 MG tablet Take 1 tablet (25 mg total) by mouth daily. 90 tablet 0  . Hydrocodone-Acetaminophen (VICODIN) 5-300 MG TABS Take 1 tablet by mouth every 6 (six) hours. 30 each 0  . methocarbamol (ROBAXIN) 500 MG tablet Reported on [id number]  0  . non-metallic deodorant (ALRA) MISC Apply 1 application topically daily as needed.    . ondansetron (ZOFRAN) 4 MG tablet Take 4 mg by mouth every 6 (six) hours as needed. Reported on 08/06/2015  0  . Wound Dressings (SONAFINE) Apply 1 application topically 2 (two) times daily.     No current facility-administered medications for this encounter.     ALLERGIES: Codeine   LABORATORY DATA:  Lab Results  Component Value Date   WBC 7.7 04/30/2015   HGB 9.8* 04/30/2015   HCT 30.9* 04/30/2015   MCV 85.8 04/30/2015   PLT 166 04/30/2015   Lab Results  Component Value Date   NA 133* 04/22/2015   K 3.2* 04/22/2015   CL 94* 04/22/2015   CO2 30 04/22/2015   Lab Results  Component  Value Date   ALT 24 04/22/2015   AST 34 04/22/2015   ALKPHOS 64 04/22/2015   BILITOT 0.3 04/22/2015     NARRATIVE: Sherry Bender was seen today for weekly treatment management. The chart was checked and the patient's films were reviewed. She reports rawness of sub-clavicle and left chest wall. She is using neosporin on those areas. She reports irritation and slight burning in those areas. She states that her skin currently feels as though it is "on fire." She reports using RadiaPlex to manage this. She also reports left upper back redness and is using RadiaPlex there was well. She states that she experiences slight fatigue at times.   PHYSICAL EXAMINATION: vitals were not taken for this visit.   Patient is in no acute distress. She is alert and oriented.  On physical exam, skin on the breast looks good.   ASSESSMENT: The patient is doing satisfactorily with treatment.  PLAN: We will continue with the patient's radiation treatment as planned. She has one more treatment day left. I will see her back in 6 weeks.   ------------------------------------------------  Jodelle Gross, MD, PhD    This document serves as a record of services personally performed by Kyung Rudd, MD. It was created on his behalf by Jenell Milliner, a trained medical scribe. The creation of this record is based on the scribe's personal observations and the  provider's statements to them. This document has been checked and approved by the attending provider.

## 2015-08-11 NOTE — Progress Notes (Signed)
Weekly rad txs left breast/sibclavicular  Chest wall region 32/33 completed,, raw ness on subclavicle and left chest wall, erythema, using neosporin on those areas, irritation and slight burning, rqadiaplex bid, slight fatigued at times, left upper back reddened, using radiaplex there as well 2:22 PM BP 132/72 mmHg  Pulse 89  Temp(Src) 97.6 F (36.4 C) (Oral)  Resp 16  Wt 101 lb 6.4 oz (45.995 kg)  LMP 02/28/1999 Wt Readings from Last 3 Encounters:  08/11/15 101 lb 6.4 oz (45.995 kg)  08/06/15 99 lb 6.4 oz (45.088 kg)  07/30/15 101 lb 4.8 oz (45.949 kg)

## 2015-08-11 NOTE — Progress Notes (Signed)
  Radiation Oncology         [phone] ________________________________  Name: Sherry Bender MRN: [id number]  Date: 07/21/2015  DOB: 03-Jan-1951  Complex simulation note  The patient has undergone complex simulation for her upcoming boost treatment for her diagnosis of breast cancer. The patient has initially been planned to receive 50.4 Gy to the chest wall. The patient will now receive a 10 Gy boost to the mastectomy scar which has been identified. This will be accomplished using an en face electron field. Based on the depth of the target area, 6 MeV electrons will be used. The patient's final total dose therefore will be 60.4 Gy. A special port plan is requested for the boost treatment.   _______________________________  Jodelle Gross, MD, PhD

## 2015-08-12 ENCOUNTER — Ambulatory Visit
Admission: RE | Admit: 2015-08-12 | Discharge: 2015-08-12 | Disposition: A | Discharge status: Home / Self Care | Payer: Managed Care, Other (non HMO) | Source: Ambulatory Visit | Attending: Radiation Oncology | Admitting: Radiation Oncology

## 2015-08-12 ENCOUNTER — Encounter: Payer: Self-pay | Admitting: Radiation Oncology

## 2015-08-12 DIAGNOSIS — Z510 Encounter for antineoplastic radiation therapy: Secondary | ICD-10-CM | POA: Diagnosis not present

## 2015-08-13 ENCOUNTER — Encounter: Payer: Self-pay | Admitting: Radiation Oncology

## 2015-08-13 ENCOUNTER — Telehealth: Payer: Self-pay | Admitting: *Deleted

## 2015-08-13 NOTE — Telephone Encounter (Signed)
  Oncology Nurse Navigator Documentation    Navigator Encounter Type: Telephone (08/13/15 0900)           Patient Visit Type: RadOnc (08/13/15 0900) Treatment Phase: Final Radiation Tx (08/13/15 0900)     Interventions: Referrals (08/13/15 0900) Referrals: Survivorship (08/13/15 0900)                    Time Spent with Patient: 15 (08/13/15 0900)

## 2015-08-16 ENCOUNTER — Other Ambulatory Visit: Payer: Self-pay | Admitting: Adult Health

## 2015-08-16 DIAGNOSIS — C50412 Malignant neoplasm of upper-outer quadrant of left female breast: Secondary | ICD-10-CM

## 2015-08-18 ENCOUNTER — Telehealth: Payer: Self-pay | Admitting: Hematology and Oncology

## 2015-08-18 NOTE — Telephone Encounter (Signed)
Spoke with patient she did not want to schedule survivorship appt at this time. Will make decision by 3/28 appt

## 2015-08-20 MED FILL — DEXILANT DR 60 MG CAPSULE: 60 | 30 days supply | Qty: 30 | Fill #3

## 2015-08-27 NOTE — Progress Notes (Signed)
  Radiation Oncology         [phone] ________________________________  Name: Sherry Bender MRN: [id number]  Date: 08/12/2015  DOB: May 31, 1951  End of Treatment Note  Diagnosis:   left-sided breast cancer     Indication for treatment:  Curative       Radiation treatment dates:   06/28/15 - 08/12/15  Site/dose:   The patient initially received a dose of 50.4 Gy in 28 fractions to the chest wall and supraclavicular region. This was delivered using a 3-D conformal, 4 field technique. The patient then received a boost to the mastectomy scar. This delivered an additional 10 Gy in 5 fractions using an en face electron field. The total dose was 60.4 Gy.  Narrative: The patient tolerated radiation treatment relatively well.   The patient had some expected skin irritation as she progressed during treatment. Moist desquamation was not present at the end of treatment.  Plan: The patient has completed radiation treatment. The patient will return to radiation oncology clinic for routine followup in one month. I advised the patient to call or return sooner if they have any questions or concerns related to their recovery or treatment. ________________________________  Jodelle Gross, M.D., Ph.D.

## 2015-08-30 ENCOUNTER — Telehealth: Payer: Self-pay | Admitting: Hematology and Oncology

## 2015-08-30 NOTE — Telephone Encounter (Signed)
Due to call day moved 3/28 lab/fu to 3/27 @ 1:45 pm. Spoke with patient she is aware.

## 2015-09-13 ENCOUNTER — Ambulatory Visit (HOSPITAL_BASED_OUTPATIENT_CLINIC_OR_DEPARTMENT_OTHER): Payer: Managed Care, Other (non HMO) | Admitting: Hematology and Oncology

## 2015-09-13 ENCOUNTER — Other Ambulatory Visit (HOSPITAL_BASED_OUTPATIENT_CLINIC_OR_DEPARTMENT_OTHER): Payer: Managed Care, Other (non HMO)

## 2015-09-13 ENCOUNTER — Telehealth: Payer: Self-pay | Admitting: Hematology and Oncology

## 2015-09-13 ENCOUNTER — Encounter: Payer: Self-pay | Admitting: Hematology and Oncology

## 2015-09-13 VITALS — BP 141/72 | HR 84 | Temp 97.9°F | Resp 19 | Wt 97.9 lb

## 2015-09-13 DIAGNOSIS — C50412 Malignant neoplasm of upper-outer quadrant of left female breast: Secondary | ICD-10-CM | POA: Diagnosis not present

## 2015-09-13 LAB — COMPREHENSIVE METABOLIC PANEL
ALT: 24 U/L (ref 0–55)
AST: 29 U/L (ref 5–34)
Albumin: 3.7 g/dL (ref 3.5–5.0)
Alkaline Phosphatase: 60 U/L (ref 40–150)
Anion Gap: 9 mEq/L (ref 3–11)
BUN: 11.7 mg/dL (ref 7.0–26.0)
CO2: 31 mEq/L — ABNORMAL HIGH (ref 22–29)
Calcium: 9.3 mg/dL (ref 8.4–10.4)
Chloride: 94 mEq/L — ABNORMAL LOW (ref 98–109)
Creatinine: 0.5 mg/dL — ABNORMAL LOW (ref 0.6–1.1)
EGFR: >90 mL/min/{1.73_m2} (ref >90)
Glucose: 84 mg/dl (ref 70–140)
Potassium: 3.2 mEq/L — ABNORMAL LOW (ref 3.5–5.1)
Sodium: 134 mEq/L — ABNORMAL LOW (ref 136–145)
Total Bilirubin: <0.3 mg/dL (ref 0.20–1.20)
Total Protein: 7.1 g/dL (ref 6.4–8.3)

## 2015-09-13 LAB — CBC WITH DIFFERENTIAL/PLATELET
BASO%: 0.8 % (ref 0.0–2.0)
Basophils Absolute: 0 10*3/uL (ref 0.0–0.1)
EOS%: 4.8 % (ref 0.0–7.0)
Eosinophils Absolute: 0.2 10*3/uL (ref 0.0–0.5)
HCT: 33.6 % — ABNORMAL LOW (ref 34.8–46.6)
HGB: 10.7 g/dL — ABNORMAL LOW (ref 11.6–15.9)
LYMPH%: 11.6 % — ABNORMAL LOW (ref 14.0–49.7)
MCH: 25.1 pg (ref 25.1–34.0)
MCHC: 31.9 g/dL (ref 31.5–36.0)
MCV: 78.7 fL — ABNORMAL LOW (ref 79.5–101.0)
MONO#: 0.6 10*3/uL (ref 0.1–0.9)
MONO%: 11.7 % (ref 0.0–14.0)
NEUT#: 3.4 10*3/uL (ref 1.5–6.5)
NEUT%: 71.1 % (ref 38.4–76.8)
Platelets: 178 10*3/uL (ref 145–400)
RBC: 4.27 10*6/uL (ref 3.70–5.45)
RDW: 17.7 % — ABNORMAL HIGH (ref 11.2–14.5)
WBC: 4.8 10*3/uL (ref 3.9–10.3)
lymph#: 0.6 10*3/uL — ABNORMAL LOW (ref 0.9–3.3)

## 2015-09-13 MED ORDER — DEXLANSOPRAZOLE 60 MG PO CPDR: 60.0000 mg | DELAYED_RELEASE_CAPSULE | Freq: Every day | ORAL | Status: DC

## 2015-09-13 MED FILL — DEXILANT DR 60 MG CAPSULE: 60 | 30 days supply | Qty: 30 | Fill #4

## 2015-09-13 NOTE — Assessment & Plan Note (Signed)
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 0/3 Lymph Nodes, T1aN0 stage IA Adjuvant radiation therapy 06/28/2015 to 08/09/2015  Hypokalemia Anemia  Return to clinic in 6 months for follow-up.

## 2015-09-13 NOTE — Telephone Encounter (Signed)
Gave patient avs report and appointments for September.  °

## 2015-09-13 NOTE — Progress Notes (Signed)
Patient Care Team: Chipper Herb, MD as PCP - General (Family Medicine) Dr Geanie Berlin (Gynecology) Holley Bouche, NP as Nurse Practitioner (Nurse Practitioner)  DIAGNOSIS: Breast cancer of upper-outer quadrant of left female breast [facility])   Staging form: Breast, AJCC 7th Edition     Clinical stage from 11/18/2014: Stage IIB (T3, N0, M0) - Unsigned   SUMMARY OF ONCOLOGIC HISTORY:   Breast cancer of upper-outer quadrant of left female breast ([place])   11/09/2014 Mammogram Left breast irregular hypoechoic mass 12:00 position 5.3 x 3.7 x 2.9 cm, left low axilla normal sized lymph nodes   11/09/2014 Initial Diagnosis Left Bx: IDC with DCIS; Grade 3, Er 0%, PR 0%, Her 2 Neg, Ki 67: 70%   11/30/2014 -  Neo-Adjuvant Chemotherapy Dose dense Adriamycin and Cytoxan 4 followed by Abraxane weekly 12   04/05/2015 Breast MRI Breast complete imaging response to neoadjuvant chemotherapy   04/30/2015 Surgery Left mastectomy: IDC 2 mm residual disease, 1 lymph node positive for isolated tumor cells within normal Lymphatics. 0/3 Lymph Nodes, T1aN0 stage IA   06/28/2015 - 08/11/2015 Radiation Therapy Adjuvant radiation therapy    CHIEF COMPLIANT: follow-up after radiation therapy  INTERVAL HISTORY: Sherry Bender is a 65 year old with above-mentioned history of left breast cancer currently treated with neoadjuvant chemotherapy followed by mastectomy. She had received adjuvant radiation therapy. She is here today to discuss next steps in the treatment. She denies having any lumps nodules or any problems the breasts.  REVIEW OF SYSTEMS:   Constitutional: Denies fevers, chills or abnormal weight loss Eyes: Denies blurriness of vision Ears, nose, mouth, throat, and face: Denies mucositis or sore throat Respiratory: Denies cough, dyspnea or wheezes Cardiovascular: Denies palpitation, chest discomfort Gastrointestinal:  Denies nausea, heartburn or change in bowel habits Skin: Denies abnormal skin rashes Lymphatics:  Denies new lymphadenopathy or easy bruising Neurological:Denies numbness, tingling or new weaknesses Behavioral/Psych: Mood is stable, no new changes  Extremities: No lower extremity edema Breast:  denies any pain or lumps or nodules in either breasts All other systems were reviewed with the patient and are negative.  I have reviewed the past medical history, past surgical history, social history and family history with the patient and they are unchanged from previous note.  ALLERGIES:  is allergic to codeine.  MEDICATIONS:  Current Outpatient Prescriptions  Medication Sig Dispense Refill  . dexlansoprazole (DEXILANT) 60 MG capsule Take 1 capsule (60 mg total) by mouth daily. 30 capsule 4  . diazepam (VALIUM) 5 MG tablet Take 1 tablet (5 mg total) by mouth every 6 (six) hours as needed for anxiety. 30 tablet 0  . estradiol (ESTRACE) 1 MG tablet Take 1 mg by mouth every other day.    . hyaluronate sodium (RADIAPLEXRX) GEL Apply 1 application topically 2 (two) times daily. Reported on 08/06/2015    . hydrochlorothiazide (HYDRODIURIL) 25 MG tablet Take 1 tablet (25 mg total) by mouth daily. 90 tablet 0  . Hydrocodone-Acetaminophen (VICODIN) 5-300 MG TABS Take 1 tablet by mouth every 6 (six) hours. 30 each 0  . methocarbamol (ROBAXIN) 500 MG tablet Reported on 10/04/4079  0  . non-metallic deodorant (ALRA) MISC Apply 1 application topically daily as needed.    . ondansetron (ZOFRAN) 4 MG tablet Take 4 mg by mouth every 6 (six) hours as needed. Reported on 08/06/2015  0  . Wound Dressings (SONAFINE) Apply 1 application topically 2 (two) times daily.     No current facility-administered medications for this visit.    PHYSICAL  EXAMINATION: ECOG PERFORMANCE STATUS: 1 - Symptomatic but completely ambulatory  Filed Vitals:   09/13/15 1314  BP: 141/72  Pulse: 84  Temp: 97.9 F (36.6 C)  Resp: 19   Filed Weights   09/13/15 1314  Weight: 97 lb 14.4 oz (44.407 kg)    GENERAL:alert, no  distress and comfortable SKIN: skin color, texture, turgor are normal, no rashes or significant lesions EYES: normal, Conjunctiva are pink and non-injected, sclera clear OROPHARYNX:no exudate, no erythema and lips, buccal mucosa, and tongue normal  NECK: supple, thyroid normal size, non-tender, without nodularity LYMPH:  no palpable lymphadenopathy in the cervical, axillary or inguinal LUNGS: clear to auscultation and percussion with normal breathing effort HEART: regular rate & rhythm and no murmurs and no lower extremity edema ABDOMEN:abdomen soft, non-tender and normal bowel sounds MUSCULOSKELETAL:no cyanosis of digits and no clubbing  NEURO: alert & oriented x 3 with fluent speech, no focal motor/sensory deficits EXTREMITIES: No lower extremity edema  LABORATORY DATA:  I have reviewed the data as listed   Chemistry      Component Value Date/Time   NA 134* 09/13/2015 1243   NA 133* 04/22/2015 1100   K 3.2* 09/13/2015 1243   K 3.2* 04/22/2015 1100   CL 94* 04/22/2015 1100   CO2 31* 09/13/2015 1243   CO2 30 04/22/2015 1100   BUN 11.7 09/13/2015 1243   BUN 10 04/22/2015 1100   CREATININE 0.5* 09/13/2015 1243   CREATININE 0.41* 04/30/2015 1216      Component Value Date/Time   CALCIUM 9.3 09/13/2015 1243   CALCIUM 9.5 04/22/2015 1100   ALKPHOS 60 09/13/2015 1243   ALKPHOS 64 04/22/2015 1100   AST 29 09/13/2015 1243   AST 34 04/22/2015 1100   ALT 24 09/13/2015 1243   ALT 24 04/22/2015 1100   BILITOT <0.30 09/13/2015 1243   BILITOT 0.3 04/22/2015 1100       Lab Results  Component Value Date   WBC 4.8 09/13/2015   HGB 10.7* 09/13/2015   HCT 33.6* 09/13/2015   MCV 78.7* 09/13/2015   PLT 178 09/13/2015   NEUTROABS 3.4 09/13/2015     ASSESSMENT & PLAN:  Breast cancer of upper-outer quadrant of left female breast ([place]) 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 0/3 Lymph Nodes, T1aN0 stage IA Adjuvant radiation therapy 06/28/2015 to 08/09/2015  Hypokalemia: Potassium still remains a 3.2.patient is not taking any potassium supplements. I'll continue with the same. Anemia: Hemoglobin is 10.7 and MCV 78.7. I inquired about blood losses. She has not lost any lead.  Return to clinic in 6 months for follow-up.   Orders Placed This Encounter  Procedures  . CBC with Differential    Standing Status: Future     Number of Occurrences:      Standing Expiration Date: 09/12/2016  . Comprehensive metabolic panel    Standing Status: Future     Number of Occurrences:      Standing Expiration Date: 09/12/2016  . Ferritin    Standing Status: Future     Number of Occurrences:      Standing Expiration Date: 09/12/2016  . Iron and TIBC    Standing Status: Future  Number of Occurrences:      Standing Expiration Date: 09/12/2016   The patient has a good understanding of the overall plan. she agrees with it. she will call with any problems that may develop before the next visit here.   Rulon Eisenmenger, MD 09/13/2015

## 2015-09-13 NOTE — Progress Notes (Signed)
Unable to get in to exam room prior to MD.  No assessment performed.  

## 2015-09-14 ENCOUNTER — Telehealth: Payer: Self-pay

## 2015-09-14 ENCOUNTER — Other Ambulatory Visit: Payer: Managed Care, Other (non HMO)

## 2015-09-14 ENCOUNTER — Ambulatory Visit: Payer: Managed Care, Other (non HMO) | Admitting: Hematology and Oncology

## 2015-09-14 NOTE — Telephone Encounter (Signed)
Per Dr. Lindi Adie, let pt know her potassium is 3.2 and he would like to supplement her diet with potassium rich foods.  Pt requested a list.  I let pt know I would mail it.  Pt as also c/o nausea that is ongoing.  I let pt know I would ask Dr. Lindi Adie what he would recommend and call her tomorrow.  She voiced understanding.

## 2015-09-16 ENCOUNTER — Other Ambulatory Visit: Payer: Self-pay

## 2015-09-16 DIAGNOSIS — C50412 Malignant neoplasm of upper-outer quadrant of left female breast: Secondary | ICD-10-CM

## 2015-09-20 ENCOUNTER — Encounter: Payer: Self-pay | Admitting: Hematology and Oncology

## 2015-09-20 NOTE — Progress Notes (Signed)
Patient called with questions about bills that she received and a notice from [place] which she does not understand. Reviewed chart and billing and she has a 0 balance with [place]. Patient read some of the information from the statements and they were for Radiation. I advised patient when she comes on Thursday to bring those documents with her to Radiation and ask for Ailene Ravel or Jenny Reichmann which are the advocated for that department. Patient verbalized understanding.

## 2015-09-23 ENCOUNTER — Encounter: Payer: Managed Care, Other (non HMO) | Admitting: Nurse Practitioner

## 2015-09-23 ENCOUNTER — Ambulatory Visit
Admission: RE | Admit: 2015-09-23 | Discharge: 2015-09-23 | Disposition: A | Discharge status: Home / Self Care | Payer: Managed Care, Other (non HMO) | Source: Ambulatory Visit | Attending: Radiation Oncology | Admitting: Radiation Oncology

## 2015-09-23 ENCOUNTER — Encounter: Payer: Self-pay | Admitting: Radiation Oncology

## 2015-09-23 VITALS — BP 163/77 | HR 87 | Temp 98.0°F | Resp 16

## 2015-09-23 DIAGNOSIS — C50412 Malignant neoplasm of upper-outer quadrant of left female breast: Secondary | ICD-10-CM

## 2015-09-23 MED ORDER — GABAPENTIN 300 MG PO CAPS: 300.0000 mg | ORAL_CAPSULE | Freq: Two times a day (BID) | ORAL | Status: DC | PRN

## 2015-09-23 MED FILL — GABAPENTIN 300 MG CAPSULE: 300 | 30 days supply | Qty: 60 | Fill #0

## 2015-09-23 NOTE — Progress Notes (Signed)
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  Interval Since Last Radiation:  1 month  06/28/15-08/12/15: 50.4 Gy/28 fractions to the left chest wall and supraclavicular region with 10 Gy/5 fxns to the mastectomy scar.  Narrative:  The patient returns today for routine follow-up.  She has been seen by Dr. Lindi Adie, and has plans to follow up with him in 6 months time.   On review of systems, the patient reports that she is doing very well overall except for the concern that she has numbness on her left axilla. She states that she is been experiencing numbness since her original surgery and his use to leave Vicodin for pain relief without significant improvement of her symptoms. She states that this keeps her up at nighttime and from sleeping. She denies any skin changes of her chest wall, and reports that her energy is improving. She denies any chest pain or shortness of breath, fevers, chills, unintended weight changes, nausea, vomiting, abdominal pain. She denies any bowel or bladder disturbances. A complete review of systems is obtained and is otherwise negative.                              ALLERGIES:  is allergic to codeine.  Meds: Current Outpatient Prescriptions  Medication Sig Dispense Refill  . dexlansoprazole (DEXILANT) 60 MG capsule Take 1 capsule (60 mg total) by mouth daily. 30 capsule 4  . diazepam (VALIUM) 5 MG tablet Take 1 tablet (5 mg total) by mouth every 6 (six) hours as needed for anxiety. 30 tablet 0  . estradiol (ESTRACE) 1 MG tablet Take 1 mg by mouth every other day.    . hydrochlorothiazide (HYDRODIURIL) 25 MG tablet Take 1 tablet (25 mg total) by mouth daily. 90 tablet 0  . gabapentin  (NEURONTIN) 300 MG capsule Take 1 capsule (300 mg total) by mouth 2 (two) times daily as needed. 60 capsule 12  . hyaluronate sodium (RADIAPLEXRX) GEL Apply 1 application topically 2 (two) times daily. Reported on 09/23/2015    . Hydrocodone-Acetaminophen (VICODIN) 5-300 MG TABS Take 1 tablet by mouth every 6 (six) hours. (Patient not taking: Reported on 09/23/2015) 30 each 0  . methocarbamol (ROBAXIN) 500 MG tablet Reported on 0000000  0  . non-metallic deodorant (ALRA) MISC Apply 1 application topically daily as needed. Reported on 09/23/2015    . ondansetron (ZOFRAN) 4 MG tablet Take 4 mg by mouth every 6 (six) hours as needed. Reported on 09/23/2015  0  . Wound Dressings (SONAFINE) Apply 1 application topically 2 (two) times daily. Reported on 09/23/2015     No current facility-administered medications for this encounter.    Physical Findings:  oral temperature is 98 F (36.7 C). Her blood pressure is 163/77 and her pulse is 87. Her respiration is 16.   Pain 0/10   In general this is a well appearing Caucasian female in no acute distress. She's alert and oriented x4 and appropriate throughout the examination. Cardiopulmonary assessment is negative for acute distress and she exhibits normal effort. The patient's left chest is inspected, her mastectomy scar L healed and there is no evidence of hyperpigmentation or desquamation of the  skin. This is also confirmed on the posterior chest wall. No evidence of hyper keratotic changes of the skin are otherwise noted. No evidence of lymphedema of her left upper extremity is identified.    Lab Findings: Lab Results  Component Value Date   WBC 4.8 09/13/2015   HGB 10.7* 09/13/2015   HCT 33.6* 09/13/2015   MCV 78.7* 09/13/2015   PLT 178 09/13/2015     Radiographic Findings: No results found.  Impression/Plan: 1. 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 The patient completed her chemotherapy and  radiation, she is moving into his surveillance and will see Dr. Nyoka Cowden in approximately 6 months time. She will be closely followed by him, as well as survivorship clinic, we will release her from our care, and would be happy to see her back if there are any other questions or concerns regarding her previous treatment. 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 We discussed the use of gabapentin. She is interested in starting this, I have discussed the surgeon in the next month she should take 1 tablet PO daily at bedtime and can  take up 2 tablets per day. She states that she is comfortable titrating up to 300 mg in the afternoon, and 300 mg in the evening if her symptoms do not improve with single dosing at hs. A prescription is called in for gabapentin 300 mg #60 to be taken 1 tablet 2 tablets by mouth daily #12 refills.  I will contact her by phone in 1 month's time to determine how she is doing. She states agreement and understanding of this plan. 3. Survivorship. The patient has completed her therapy, and is interested in moving forward with evaluation with Chestine Spore, nurse practitioner who seized breast cancer patients and survivorship. She is counseled on meeting with her in 1-2 months. This will be coordinated and she encouraged to inform that she has any questions or concerns as well prior to that visit.     Carola Rhine, PAC

## 2015-09-23 NOTE — Progress Notes (Signed)
Follow up left breast  Chest wall radiation , well healed, appetite good, still has pain/numbness under left arm, energy level is getting better, taking b12, tablet daily, Saw Dr. Lindi Adie last 09/13/15 next follow up 03/13/16  3:51 PM BP 163/77 mmHg  Pulse 87  Temp(Src) 98 F (36.7 C) (Oral)  Resp 16  LMP 02/28/1999  Wt Readings from Last 3 Encounters:  09/13/15 97 lb 14.4 oz (44.407 kg)  08/11/15 101 lb 6.4 oz (45.995 kg)  08/06/15 99 lb 6.4 oz (45.088 kg)

## 2015-09-24 ENCOUNTER — Telehealth: Payer: Self-pay | Admitting: *Deleted

## 2015-09-24 ENCOUNTER — Telehealth: Payer: Self-pay | Admitting: Radiation Oncology

## 2015-09-24 MED FILL — GABAPENTIN 100 MG CAPSULE: 100 | 30 days supply | Qty: 30 | Fill #0

## 2015-09-24 NOTE — Telephone Encounter (Signed)
CALLED PATIENT TO INFORM OF SURVIVORSHIP APPT. FOR 11-03-15 @ 2 PM WITH HEATHER MACKEY, SPOKE WITH PATIENT AND SHE IS AWARE OF THIS APPT.

## 2015-09-24 NOTE — Telephone Encounter (Signed)
The patient called me to tell me that gabapentin made her very sedated after taking it last night and she has just found that she feels more alert and less "drunk." She requests a dose modification of this. I have encouraged her to discontinue this and to try 100 mg capsule one po qhs for her neuropathy. This is called into the pharmacy as a new prescription.

## 2015-09-30 ENCOUNTER — Other Ambulatory Visit: Payer: Self-pay | Admitting: Hematology and Oncology

## 2015-09-30 ENCOUNTER — Other Ambulatory Visit: Payer: Self-pay | Admitting: Family Medicine

## 2015-09-30 NOTE — Telephone Encounter (Signed)
Patient last seen 11/12/14  Dr Livia Snellen

## 2015-09-30 NOTE — Telephone Encounter (Signed)
HCTZ Not filled in 5 years in our clinic, WOuld prefer to see that she has been on it or see her before filling.   Also not seen here since 2015.   Laroy Apple, MD [facility] 09/30/2015, 1:15 PM

## 2015-09-30 NOTE — Telephone Encounter (Signed)
Pt states she is a cancer pt and wants to see if you will refill it.

## 2015-10-04 NOTE — Telephone Encounter (Signed)
In order to consider this patient request, the patient will need to be seen 

## 2015-10-04 NOTE — Telephone Encounter (Signed)
Pt is aware and states she is waiting on a call from the breast center to see if she needs to be seen with them. She will CB to schedule if she needs to.

## 2015-10-05 ENCOUNTER — Other Ambulatory Visit: Payer: Self-pay | Admitting: *Deleted

## 2015-10-05 DIAGNOSIS — C50412 Malignant neoplasm of upper-outer quadrant of left female breast: Secondary | ICD-10-CM

## 2015-10-05 MED ORDER — HYDROCHLOROTHIAZIDE 25 MG PO TABS: 25.0000 mg | ORAL_TABLET | Freq: Every day | ORAL | Status: DC

## 2015-10-07 ENCOUNTER — Telehealth: Payer: Self-pay

## 2015-10-07 DIAGNOSIS — C50412 Malignant neoplasm of upper-outer quadrant of left female breast: Secondary | ICD-10-CM

## 2015-10-07 MED ORDER — MECLIZINE HCL 25 MG PO TABS: 25.0000 mg | ORAL_TABLET | ORAL | Status: DC

## 2015-10-07 NOTE — Telephone Encounter (Addendum)
Called pt back regarding triage call from earlier today.  Pt still experiencing symptoms.  Discussed with Lindi Adie, MD who recommended PT referral for BPPV as well as Meclizine 25mg  PRN until pt able to follow up.  Pt notified of plan of care and new prescription being called in.  Discussed potential drowsiness as well as safety instructions to prevent falls while she experiences these symptoms.  Pt verbalized understanding and stated her husband would do all of the driving.  No further questions at time of call.

## 2015-10-07 NOTE — Addendum Note (Signed)
Addended by: Cheree Ditto on: 10/07/2015 05:33 PM   Modules accepted: Orders

## 2015-10-07 NOTE — Telephone Encounter (Signed)
Pt c/o nausea since this morning. Her head is dizzy, when she looks down she gets dizzy. Fuzzy headed, dizzy headed. Sat down about 1230 and got up at 1330 and feel pretty much the same. Been able to eat 2 eggs, toast and jello about 1200. This is her normal eating time.  She has drank about 6 glasses of h2o today. 12 oz glasses. Passing stools and urine. No vomiting. Headache this morning went away. And neck ache near shoulders. Ringing in ears present since chemo started. Last chemo 03/30/15, last xrt 08/11/15. She also noted she has been shakey this morning. This happened last Saturday but not as severe.

## 2015-10-08 ENCOUNTER — Telehealth: Payer: Self-pay | Admitting: Hematology and Oncology

## 2015-10-08 ENCOUNTER — Telehealth: Payer: Self-pay | Admitting: Oncology

## 2015-10-08 NOTE — Telephone Encounter (Signed)
Gave patient avs report and appointments for feb 2018. Patient aware April 2018 appointment will be addressed at feb 2018 visit. 2 pof's sent for patient today confirmed with HB the appointment that should be scheduled is lab/fu feb 2018 not sept 2016.

## 2015-10-08 NOTE — Telephone Encounter (Signed)
Called [facility] to sch appt for pt. Scheduler informed me that referral orders are not put in correctly and this prevents them form contacting patients directly to sch appts from their workque. Scheduler informed me that she will contact pt to sch appt due to questions needing to be answered.

## 2015-10-12 ENCOUNTER — Other Ambulatory Visit: Payer: Self-pay

## 2015-10-12 DIAGNOSIS — C50412 Malignant neoplasm of upper-outer quadrant of left female breast: Secondary | ICD-10-CM

## 2015-10-14 ENCOUNTER — Ambulatory Visit: Payer: Managed Care, Other (non HMO) | Admitting: Physical Therapy

## 2015-10-18 ENCOUNTER — Encounter: Payer: Self-pay | Admitting: Physical Therapy

## 2015-10-18 ENCOUNTER — Ambulatory Visit: Payer: Managed Care, Other (non HMO) | Attending: Hematology and Oncology | Admitting: Physical Therapy

## 2015-10-18 DIAGNOSIS — M79622 Pain in left upper arm: Secondary | ICD-10-CM | POA: Insufficient documentation

## 2015-10-18 MED FILL — DEXILANT DR 60 MG CAPSULE: 60 | 30 days supply | Qty: 30 | Fill #0

## 2015-10-18 NOTE — Therapy (Addendum)
[place], [place], [address] Phone: [phone]   Fax:  [phone]  Physical Therapy Evaluation  Patient Details  Name: Sherry Bender MRN: [id number] Date of Birth: 1951-05-02 Referring Provider: Lindi Adie  Encounter Date: 10/18/2015      PT End of Session - 10/18/15 1456    Visit Number 1   Number of Visits 1   PT Start Time 1412   PT Stop Time 1455   PT Time Calculation (min) 43 min   Activity Tolerance Patient tolerated treatment well   Behavior During Therapy [facility] for tasks assessed/performed      Past Medical History  Diagnosis Date  . Hyperlipidemia   . Hypertension   . Complication of anesthesia     "HARD TO WAKE UP".......Marland KitchenN/V  . PONV (postoperative nausea and vomiting)   . GERD (gastroesophageal reflux disease)   . Arthritis     IN BACK....  . Scoliosis   . 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 Anxiety   . Urinary frequency   . Breast cancer ([facility]) 2016    Past Surgical History  Procedure Laterality Date  . Cholecystectomy  1994  . Abdominal hysterectomy  2000    TAH/BSO  . Knee arthroscopy  2005    right knee  . Foot neuroma surgery  01/2010    also had metatarasl shortened  . Rhinoplasty    . Cervical fusion    . Anterior cervical decomp/discectomy fusion N/A 06/18/2014    Procedure: C4-5 C5-6 C6-7 ANTERIOR CERVICAL DECOMPRESSION/DISKECTOMY/FUSION;  Surgeon: Eustace Moore, MD;  Location: [place] [facility];  Service: Neurosurgery;  Laterality: N/A;  C4-5 C5-6 C6-7 ANTERIOR CERVICAL DECOMPRESSION/DISKECTOMY/FUSION  . Anterior cervical decomp/discectomy fusion N/A 06/25/2014    Procedure: EXPLORATION OF CERVICAL FUSION WITH REVIOSN OF HARDWARE;  Surgeon: Eustace Moore, MD;  Location: [place] [facility];  Service: Neurosurgery;  Laterality: N/A;  . Portacath placement N/A 11/23/2014    Procedure: INSERTION PORT-A-CATH WITH ULTRASOUND;  Surgeon: Fanny Skates, MD;  Location: [place];  Service: General;  Laterality: N/A;  . Complete mastectomy w/ sentinel node biopsy Left 04/30/2015  . Simple mastectomy with axillary sentinel node biopsy Left 04/30/2015    Procedure: LEFT TOTAL  MASTECTOMY WITH LEFT AXILLARY SENTINEL NODE BIOPSY;  Surgeon: Fanny Skates, MD;  Location: [place];  Service: General;  Laterality: Left;  . Port-a-cath removal Right 04/30/2015    Procedure: REMOVAL PORT-A-CATH;  Surgeon: Fanny Skates, MD;  Location: [place];  Service: General;  Laterality: Right;    There were no vitals filed for this visit.       Subjective Assessment - 10/18/15 1419    Subjective I don't have the pain everyday. I just happens when I do my house chores or if there is a front coming in I might feel it. It is simple it is nothing serious. I have pain in my left arm and goes down my chest some. I don't use this arm a whole lot. I know when not to use it. I can bathe myself. I am right handed and the pain is on my left so its not that bad. Opening stuff is a chore. I wear this wrap that I put on my anklle around my arm and it makes my arm feel a lot better.    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 Her mass was located  at 12:00 position in the upper outer quadrant. She underwent neoadjuvant chemotherapy from June-October 2016. Then she had a left mastectomy with a SLNB (2 nodes) removed on 04/30/15. She completed radiation on Feb 23rd, 2017.    Patient Stated Goals to get a sleeve to help increase comfort   Currently in Pain? Yes   Pain Score 2    Pain Location Arm   Pain Orientation Upper;Posterior;Left   Pain Descriptors / Indicators Aching   Pain Type Chronic pain   Pain Onset 1 to 4 weeks ago   Pain Frequency Intermittent   Aggravating Factors  working with arm a lot doing household chores   Pain Relieving Factors Tylenol and aleve help   Effect of Pain on Daily Activities it  can affect her ability to do things in the afternoon when it starts up but then she puts her bandage on and that helps            [facility] PT Assessment - 10/18/15 0001    Assessment   Medical Diagnosis left breast cancer   Referring Provider Gudena   Onset Date/Surgical Date 04/30/15   Hand Dominance Right   Prior Therapy eval for PT 05/30/2016   Precautions   Precautions Other (comment)  at risk for lymphedema   Restrictions   Weight Bearing Restrictions No   Prior Function   Level of Independence Independent   Vocation Retired  Marine scientist   Leisure pt states she does her exercises for her LUE at home   Cognition   Overall Cognitive Status Within Functional Limits for tasks assessed   AROM   Overall AROM  Within functional limits for tasks performed  LUE ROM is functional but more limited than right           LYMPHEDEMA/ONCOLOGY QUESTIONNAIRE - 10/18/15 1430    Type   Cancer Type s/p left mastectomy with SLNB   Surgeries   Mastectomy Date 04/30/15   Sentinel Lymph Node Biopsy Date 04/30/15   Number Lymph Nodes Removed 2   Treatment   Active Chemotherapy Treatment No   Past Chemotherapy Treatment Yes   Date 03/20/15   Active Radiation Treatment No   Past Radiation Treatment Yes   Date 08/12/15   Body Site left breast and axilla   Current Hormone Treatment No   Past Hormone Therapy No   What other symptoms do you have   Are you Having Heaviness or Tightness No   Are you having Pain Yes   Are you having pitting edema No   Is it Hard or Difficult finding clothes that fit No   Do you have infections No   Is there Decreased scar mobility No   Right Upper Extremity Lymphedema   15 cm Proximal to Olecranon Process 21 cm   Olecranon Process 20.8 cm   15 cm Proximal to Ulnar Styloid Process 19 cm   Just Proximal to Ulnar Styloid Process 14 cm   Across Hand at [employer] 17 cm   At [place] of 2nd Digit 5.5 cm   Left Upper Extremity Lymphedema   15 cm Proximal to  Olecranon Process 21.1 cm   Olecranon Process 20.2 cm   15 cm Proximal to Ulnar Styloid Process 17.7 cm   Just Proximal to Ulnar Styloid Process 13.4 cm   Across Hand at [employer] 16.7 cm   At [place] of 2nd Digit 5.2 cm           [place] -  10/18/15 0001    Open a tight or new jar Severe difficulty   Do heavy household chores (wash walls, wash floors) Severe difficulty   Carry a shopping bag or briefcase Moderate difficulty   Wash your back Moderate difficulty   Use a knife to cut food Mild difficulty   Recreational activities in which you take some force or impact through your arm, shoulder, or hand (golf, hammering, tennis) Moderate difficulty   During the past week, to what extent has your arm, shoulder or hand problem interfered with your normal social activities with family, friends, neighbors, or groups? Modererately   During the past week, to what extent has your arm, shoulder or hand problem limited your work or other regular daily activities Modererately   Arm, shoulder, or hand pain. Moderate   Tingling (pins and needles) in your arm, shoulder, or hand Mild   Difficulty Sleeping No difficulty   DASH Score 45.45 %                     PT Education - 10/18/15 1507    Education provided Yes   Education Details where and how to purchase a compression sleeve   Person(s) Educated Patient   Methods Explanation   Comprehension Verbalized understanding                [place] - 10/18/15 1501    CC Long Term Goal  #1   Title Pt will be educated in how to obtain a compression garment from DME supplier to help increase comfort of LUE.    Status Achieved            Plan - 10/18/15 1456    Clinical Impression Statement Pt presents to PT with LUE pain in posterior upper arm and along lateral trunk. Pt reports this pain improves when she wears a compression wrap meant for her ankle around her arm. There is no notable diffence in  circumference between LUE and RUE. She does decreased range of motion in left shoulder but it is still functional. Pt declines PT for increasing left shoulder ROM and strength stating she can do all the exercises at home. Pt was educated at length on how to purchase a compression sleeve and where to go to purchase one. Pt sent with this information and pt requests that this be the last visit with therapy.    Rehab Potential Good   PT Frequency One time visit   PT Treatment/Interventions ADLs/Self Care Home Management   Recommended Other Services purchase compression sleeve from DME provider   Consulted and Agree with Plan of Care Patient      Patient will benefit from skilled therapeutic intervention in order to improve the following deficits and impairments:  Decreased knowledge of use of DME, Pain  Visit Diagnosis: Pain in left upper arm - Plan: PT plan of care cert/re-cert     Problem List Patient Active Problem List   Diagnosis Date Noted  . Breast cancer of upper-outer quadrant of left female breast ([place]) 11/11/2014  . S/P cervical spinal fusion 06/24/2014  . HTN (hypertension) 12/06/2010  . Tobacco abuse 12/06/2010  . Hyperlipidemia 09/26/2010  . Nodular goiter 09/26/2010  . GERD (gastroesophageal reflux disease) 09/26/2010  . Degenerative disc disease 09/26/2010  . Allergic rhinitis 09/26/2010  . Anxiety disorder 09/26/2010  . Cyst, breast 09/26/2010    Alexia Freestone 10/18/2015, 3:07 PM  [place]  [place], [place], [address] Phone: [phone]   Fax:  [phone]  Name: Sherry Bender MRN: [id number] Date of Birth: May 19, 1951   Allyson Sabal, PT 10/18/2015 3:08 PM  PHYSICAL THERAPY DISCHARGE SUMMARY  Visits from Start of Care: 1  Current functional level related to goals / functional outcomes: See above   Remaining deficits: See above   Education / Equipment: How to obtain a  compression garment  Plan: Patient agrees to discharge.  Patient goals were met. Patient is being discharged due to meeting the stated rehab goals.  ?????   Allyson Sabal, PT 05/09/16 8:35 AM

## 2015-10-19 ENCOUNTER — Telehealth: Payer: Self-pay

## 2015-10-19 NOTE — Telephone Encounter (Signed)
Sedn the prescrioption

## 2015-10-19 NOTE — Telephone Encounter (Signed)
[facility] called requesting an rx for lymphedema compression sleeve 20/30 with prn refills.

## 2015-11-01 ENCOUNTER — Telehealth: Payer: Self-pay | Admitting: Nurse Practitioner

## 2015-11-01 NOTE — Telephone Encounter (Signed)
Received call from Ms. Broadus needing to cancel appointment due to death in family.  This provider will call her in two weeks time to reschedule.

## 2015-11-03 ENCOUNTER — Encounter: Payer: Managed Care, Other (non HMO) | Admitting: Nurse Practitioner

## 2015-11-05 ENCOUNTER — Telehealth: Payer: Self-pay

## 2015-11-05 NOTE — Telephone Encounter (Signed)
Received VM from pt stating her left arm and shoulder were still causing her pain.  Pt states she has lymphedema and is awaiting her compression sleeves to come in.  I instructed pt this was a normal feeling with lymphedema unfortunately.  Pt reports no changes in pain or arm appearance since last discussed with MD.  I spoke with pt about lymphedema clinic and many options out there for assistance with lymphedema.  Pt very interested.  I informed her I would notify Gudena, MD and if he gives verbal order, I will put in for a referral for her to be seen by the lymphedema clinic for evaluation.  Pt very appreciative and without further questions or concerns at time of call.

## 2015-11-07 ENCOUNTER — Other Ambulatory Visit: Payer: Self-pay | Admitting: Hematology and Oncology

## 2015-11-08 ENCOUNTER — Telehealth: Payer: Self-pay | Admitting: Radiation Oncology

## 2015-11-08 NOTE — Telephone Encounter (Addendum)
I called the pt to see how check on her neuropathy. She is still having significant trouble with this and could not tolerate the gabapentin  due to sedation. She has not heard back about her referral for lymphedema clinic for management of this an the edema. I will forward on to Dr. Geralyn Flash nurse as well as the patient is interested in his thoughts on other medications for this.

## 2015-11-09 ENCOUNTER — Other Ambulatory Visit: Payer: Self-pay

## 2015-11-09 DIAGNOSIS — C50412 Malignant neoplasm of upper-outer quadrant of left female breast: Secondary | ICD-10-CM

## 2015-11-10 ENCOUNTER — Encounter (INDEPENDENT_AMBULATORY_CARE_PROVIDER_SITE_OTHER): Payer: Self-pay

## 2015-11-11 ENCOUNTER — Ambulatory Visit: Payer: Managed Care, Other (non HMO) | Admitting: Family Medicine

## 2015-11-16 ENCOUNTER — Ambulatory Visit (INDEPENDENT_AMBULATORY_CARE_PROVIDER_SITE_OTHER): Payer: Managed Care, Other (non HMO) | Admitting: Family Medicine

## 2015-11-16 ENCOUNTER — Telehealth: Payer: Self-pay | Admitting: Nurse Practitioner

## 2015-11-16 ENCOUNTER — Telehealth: Payer: Self-pay | Admitting: Family Medicine

## 2015-11-16 ENCOUNTER — Encounter: Payer: Self-pay | Admitting: Family Medicine

## 2015-11-16 VITALS — BP 145/75 | HR 80 | Temp 97.1°F | Ht 66.0 in | Wt 98.0 lb

## 2015-11-16 DIAGNOSIS — T451X5A Adverse effect of antineoplastic and immunosuppressive drugs, initial encounter: Secondary | ICD-10-CM

## 2015-11-16 DIAGNOSIS — D6481 Anemia due to antineoplastic chemotherapy: Secondary | ICD-10-CM

## 2015-11-16 DIAGNOSIS — I10 Essential (primary) hypertension: Secondary | ICD-10-CM | POA: Diagnosis not present

## 2015-11-16 DIAGNOSIS — C50412 Malignant neoplasm of upper-outer quadrant of left female breast: Secondary | ICD-10-CM

## 2015-11-16 MED ORDER — LANSOPRAZOLE 30 MG PO CPDR: 30.0000 mg | DELAYED_RELEASE_CAPSULE | Freq: Two times a day (BID) | ORAL | Status: DC

## 2015-11-16 MED ORDER — TRIAMTERENE-HCTZ 37.5-25 MG PO TABS: 1.0000 | ORAL_TABLET | Freq: Every day | ORAL | Status: DC

## 2015-11-16 MED ORDER — MIRABEGRON ER 50 MG PO TB24: 50.0000 mg | ORAL_TABLET | Freq: Every day | ORAL | Status: DC

## 2015-11-16 MED ORDER — DEXLANSOPRAZOLE 60 MG PO CPDR: 60.0000 mg | DELAYED_RELEASE_CAPSULE | Freq: Every day | ORAL | Status: DC

## 2015-11-16 MED ORDER — TOLTERODINE TARTRATE ER 2 MG PO CP24: 2.0000 mg | ORAL_CAPSULE | Freq: Every day | ORAL | Status: DC

## 2015-11-16 NOTE — Telephone Encounter (Signed)
Called to reschedule SCP visit for patient that had to be cancelled earlier this month due to death in family.  Left message for return call from patient.  Can reschedule or if patient would prefer, also happy to mail her the copy of the care plan for her review.  Asked pt for return call to notify us of her preferences.  Will await return call.

## 2015-11-16 NOTE — Telephone Encounter (Signed)
Insurance will cover trospium or oxybutynin xr in place of myrbetriq.  Will cover omeprazole, lansoprazole, and pantoprazole in place of Dexilant.

## 2015-11-16 NOTE — Telephone Encounter (Signed)
Pharmacy aware that detrol was sent to pharmacy

## 2015-11-16 NOTE — Patient Instructions (Signed)
Consider tapering off of estradiol by taking 1/2 every other day.  Substitute.  Remifemin instead. Available without a prescription at many drug stores

## 2015-11-16 NOTE — Progress Notes (Signed)
Subjective:  Patient ID: Sherry Bender, female    DOB: 04/09/1951  Age: 65 y.o. MRN: [id number]  CC: Medical Management of Chronic Issues; Medication Refill; and Nocturia   HPI Sherry Bender presents for  follow-up of hypertension. Patient has no history of headache chest pain or shortness of breath or recent cough. Patient also denies symptoms of TIA such as numbness weakness lateralizing. PatientDoes not check  blood pressure at home however she has been under care for chemotherapy and radiation therapy has a large number of readings available many of which are elevated.. Patient denies side effects from medication other than the urinary frequency.. States taking it regularly  Patient states she is going to the bathroom constantly. She urinates 4 times overnight. She is taking HCTZ regularly her last pill is due today. Of note is that she has had borderline electrolytes in the past.  Patient recently finished treatment for breast cancer. Review of the pathology specimen shows that she was estrogen and progesterone receptor negative. She has continued to take her estradiol. She would like to taper off of that. She has started taking it every other day. And says she is willing to break the pill in half or cut it. She is considered cancer free and she is not taking any preventative medicine. She does have follow-up with her oncologist scheduled for 3 months from now.  Patient has history of hyperlipidemia but does not take medication for cholesterol. History Aleila has a past medical history of Hyperlipidemia; Hypertension; Complication of anesthesia; PONV (postoperative nausea and vomiting); GERD (gastroesophageal reflux disease); Arthritis; Scoliosis; Cancer ([place]); Anxiety; Urinary frequency; and Breast cancer ([place]) (2016).   She has past surgical history that includes Cholecystectomy (1994); Abdominal hysterectomy (2000); Knee arthroscopy (2005); Foot neuroma surgery (01/2010); Rhinoplasty;  Cervical fusion; Anterior cervical decomp/discectomy fusion (N/A, 06/18/2014); Anterior cervical decomp/discectomy fusion (N/A, 06/25/2014); Portacath placement (N/A, 11/23/2014); Complete mastectomy w/ sentinel node biopsy (Left, 04/30/2015); Simple mastectomy with axillary sentinel node biopsy (Left, 04/30/2015); and Port-a-cath removal (Right, 04/30/2015).   Her family history includes Cancer in her maternal grandmother and paternal aunt; Diabetes in her mother; Heart disease in her father and mother; Hypertension in her father and mother.She reports that she has been smoking Cigarettes.  She has a 10.5 pack-year smoking history. She has never used smokeless tobacco. She reports that she does not drink alcohol or use illicit drugs.  Current Outpatient Prescriptions on File Prior to Visit  Medication Sig Dispense Refill  . non-metallic deodorant (ALRA) MISC Apply 1 application topically daily as needed. Reported on 10/18/2015     No current facility-administered medications on file prior to visit.    ROS Review of Systems  Constitutional: Negative for fever, activity change and appetite change.  HENT: Negative for congestion, rhinorrhea and sore throat.   Eyes: Negative for visual disturbance.  Respiratory: Negative for cough and shortness of breath.   Cardiovascular: Negative for chest pain and palpitations.  Gastrointestinal: Negative for nausea, abdominal pain and diarrhea.  Genitourinary: Positive for frequency. Negative for dysuria, urgency, flank pain, decreased urine volume, enuresis and difficulty urinating.  Musculoskeletal: Negative for myalgias and arthralgias.  Psychiatric/Behavioral: Positive for confusion (eems to be forgetful since finishing her chemotherapy). Negative for suicidal ideas, self-injury and agitation.    Objective:  BP 145/75 mmHg  Pulse 80  Temp(Src) 97.1 F (36.2 C) (Oral)  Ht _0  (1.676 m)  Wt 98 lb (44.453 kg)  BMI 15.83 kg/m2  LMP 02/28/1999  BP  Readings from  Last 3 Encounters:  11/16/15 145/75  09/23/15 163/77  09/13/15 141/72    Wt Readings from Last 3 Encounters:  11/16/15 98 lb (44.453 kg)  09/13/15 97 lb 14.4 oz (44.407 kg)  08/11/15 101 lb 6.4 oz (45.995 kg)     Physical Exam  Constitutional: She is oriented to person, place, and time. She appears well-developed and well-nourished. No distress.  HENT:  Head: Normocephalic and atraumatic.  Right Ear: External ear normal.  Left Ear: External ear normal.  Nose: Nose normal.  Mouth/Throat: Oropharynx is clear and moist.  Eyes: Conjunctivae and EOM are normal. Pupils are equal, round, and reactive to light.  Neck: Normal range of motion. Neck supple. No thyromegaly present.  Cardiovascular: Normal rate, regular rhythm and normal heart sounds.   No murmur heard. Pulmonary/Chest: Effort normal and breath sounds normal. No respiratory distress. She has no wheezes. She has no rales.  Abdominal: Soft. Bowel sounds are normal. She exhibits no distension. There is no tenderness.  Lymphadenopathy:    She has no cervical adenopathy.  Neurological: She is alert and oriented to person, place, and time. She has normal reflexes.  Skin: Skin is warm and dry.  Psychiatric: She has a normal mood and affect. Her behavior is normal. Judgment and thought content normal.     Lab Results  Component Value Date   WBC 4.8 09/13/2015   HGB 10.7* 09/13/2015   HCT 33.6* 09/13/2015   PLT 178 09/13/2015   GLUCOSE 84 09/13/2015   ALT 24 09/13/2015   AST 29 09/13/2015   NA 134* 09/13/2015   K 3.2* 09/13/2015   CL 94* 04/22/2015   CREATININE 0.5* 09/13/2015   BUN 11.7 09/13/2015   CO2 31* 09/13/2015    No results found.  Assessment & Plan:   [place] was seen today for medical management of chronic issues, medication refill and nocturia.  Diagnoses and all orders for this visit:  Essential hypertension -     CBC with Differential/Platelet -     CMP14+EGFR  Malignant neoplasm  of upper-outer quadrant of left female breast (HCC) -     CBC with Differential/Platelet -     CMP14+EGFR  Anemia due to antineoplastic chemotherapy -     CBC with Differential/Platelet  Other orders -     triamterene-hydrochlorothiazide (MAXZIDE-25) 37.5-25 MG tablet; Take 1 tablet by mouth daily. -     Discontinue: mirabegron ER (MYRBETRIQ) 50 MG TB24 tablet; Take 1 tablet (50 mg total) by mouth daily. For bladder, to decrease frequency of urination -     Discontinue: dexlansoprazole (DEXILANT) 60 MG capsule; Take 1 capsule (60 mg total) by mouth daily.   I have discontinued Ms. Keadle's mirabegron ER, methocarbamol, ondansetron, Hydrocodone-Acetaminophen, diazepam, hyaluronate sodium, estradiol, SONAFINE, dexlansoprazole, gabapentin, hydrochlorothiazide, and meclizine. I am also having her start on triamterene-hydrochlorothiazide. Additionally, I am having her maintain her non-metallic deodorant.    Medication List       This list is accurate as of: 11/16/15  3:13 PM.  Always use your most recent med list.               lansoprazole 30 MG capsule  Commonly known as:  PREVACID  Take 1 capsule (30 mg total) by mouth 2 (two) times daily. On an empty stomach. Wait One hour before eating.     non-metallic deodorant Misc  Commonly known as:  ALRA  Apply 1 application topically daily as needed. Reported on 10/18/2015     tolterodine 2 MG  24 hr capsule  Commonly known as:  DETROL LA  Take 1 capsule (2 mg total) by mouth daily.     triamterene-hydrochlorothiazide 37.5-25 MG tablet  Commonly known as:  MAXZIDE-25  Take 1 tablet by mouth daily.       Call back from the pharmacy Dexilant and myrbetriq are not covered by her insurance. Substitute prescriptions for lansoprazole and Detrol LA were submitted.  Consider tapering off of estradiol by taking 1/2 every other day.  Substitute.  Remifemin instead. Available without a prescription at many drug stores   Follow-up: Return in  about 3 months (around 02/16/2016).  Claretta Fraise, M.D.

## 2015-11-17 LAB — CMP14+EGFR
ALT: 20 IU/L (ref 0–32)
AST: 29 IU/L (ref 0–40)
Albumin/Globulin Ratio: 2.1 (ref 1.2–2.2)
Albumin: 4.4 g/dL (ref 3.6–4.8)
Alkaline Phosphatase: 60 IU/L (ref 39–117)
BUN/Creatinine Ratio: 21 (ref 12–28)
BUN: 9 mg/dL (ref 8–27)
Bilirubin Total: <0.2 mg/dL (ref 0.0–1.2)
CO2: 28 mmol/L (ref 18–29)
Calcium: 9.4 mg/dL (ref 8.7–10.3)
Chloride: 91 mmol/L — ABNORMAL LOW (ref 96–106)
Creatinine, Ser: 0.42 mg/dL — ABNORMAL LOW (ref 0.57–1.00)
GFR calc Af Amer: 125 mL/min/{1.73_m2} (ref >59)
GFR calc non Af Amer: 109 mL/min/{1.73_m2} (ref >59)
Globulin, Total: 2.1 g/dL (ref 1.5–4.5)
Glucose: 85 mg/dL (ref 65–99)
Potassium: 3.3 mmol/L — ABNORMAL LOW (ref 3.5–5.2)
Sodium: 136 mmol/L (ref 134–144)
Total Protein: 6.5 g/dL (ref 6.0–8.5)

## 2015-11-17 LAB — CBC WITH DIFFERENTIAL/PLATELET
Basophils Absolute: 0 10*3/uL (ref 0.0–0.2)
Basos: 1 %
EOS (ABSOLUTE): 0.2 10*3/uL (ref 0.0–0.4)
Eos: 4 %
Hematocrit: 34 % (ref 34.0–46.6)
Hemoglobin: 10.6 g/dL — ABNORMAL LOW (ref 11.1–15.9)
Immature Grans (Abs): 0 10*3/uL (ref 0.0–0.1)
Immature Granulocytes: 0 %
Lymphocytes Absolute: 0.5 10*3/uL — ABNORMAL LOW (ref 0.7–3.1)
Lymphs: 12 %
MCH: 25.2 pg — ABNORMAL LOW (ref 26.6–33.0)
MCHC: 31.2 g/dL — ABNORMAL LOW (ref 31.5–35.7)
MCV: 81 fL (ref 79–97)
Monocytes Absolute: 0.4 10*3/uL (ref 0.1–0.9)
Monocytes: 10 %
Neutrophils Absolute: 3.1 10*3/uL (ref 1.4–7.0)
Neutrophils: 73 %
Platelets: 174 10*3/uL (ref 150–379)
RBC: 4.21 x10E6/uL (ref 3.77–5.28)
RDW: 16 % — ABNORMAL HIGH (ref 12.3–15.4)
WBC: 4.3 10*3/uL (ref 3.4–10.8)

## 2015-11-18 ENCOUNTER — Telehealth: Payer: Self-pay

## 2015-11-18 LAB — IRON AND TIBC
Iron Saturation: 5 % — CL (ref 15–55)
Iron: 21 ug/dL — ABNORMAL LOW (ref 27–139)
Total Iron Binding Capacity: 382 ug/dL (ref 250–450)
UIBC: 361 ug/dL (ref 118–369)

## 2015-11-18 LAB — FERRITIN: Ferritin: 10 ng/mL — ABNORMAL LOW (ref 15–150)

## 2015-11-18 LAB — SPECIMEN STATUS REPORT

## 2015-11-18 NOTE — Telephone Encounter (Signed)
Insurance non formulary Mrybetriq  Formulary is Oxybutynin ER and Trospium Orexcnon

## 2015-11-19 ENCOUNTER — Other Ambulatory Visit: Payer: Self-pay | Admitting: *Deleted

## 2015-11-19 ENCOUNTER — Telehealth: Payer: Self-pay | Admitting: Family Medicine

## 2015-11-19 MED ORDER — TROSPIUM CHLORIDE ER 60 MG PO CP24: 1.0000 | ORAL_CAPSULE | ORAL | Status: DC

## 2015-11-19 NOTE — Progress Notes (Signed)
RX for Trospium Chloride was dup in therapy RX cancelled per Dr Livia Snellen Pt notified

## 2015-11-19 NOTE — Telephone Encounter (Signed)
The requested med has been sent to the pharmacy.  Please let the patient know. Thanks, WS 

## 2015-11-23 ENCOUNTER — Telehealth: Payer: Self-pay | Admitting: Hematology and Oncology

## 2015-11-23 ENCOUNTER — Other Ambulatory Visit: Payer: Self-pay

## 2015-11-23 MED FILL — DEXILANT DR 60 MG CAPSULE: 60 | 30 days supply | Qty: 30 | Fill #1

## 2015-11-23 NOTE — Telephone Encounter (Signed)
Spoke with patient to confirm 6/15 appt date/time per 6/6 pof

## 2015-11-24 ENCOUNTER — Other Ambulatory Visit: Payer: Self-pay | Admitting: Hematology and Oncology

## 2015-11-24 DIAGNOSIS — D509 Iron deficiency anemia, unspecified: Secondary | ICD-10-CM | POA: Insufficient documentation

## 2015-12-02 ENCOUNTER — Ambulatory Visit (HOSPITAL_BASED_OUTPATIENT_CLINIC_OR_DEPARTMENT_OTHER): Payer: Managed Care, Other (non HMO) | Admitting: Hematology and Oncology

## 2015-12-02 ENCOUNTER — Encounter: Payer: Self-pay | Admitting: Nurse Practitioner

## 2015-12-02 ENCOUNTER — Ambulatory Visit (HOSPITAL_BASED_OUTPATIENT_CLINIC_OR_DEPARTMENT_OTHER): Payer: Self-pay | Admitting: Nurse Practitioner

## 2015-12-02 ENCOUNTER — Ambulatory Visit (HOSPITAL_BASED_OUTPATIENT_CLINIC_OR_DEPARTMENT_OTHER): Payer: Managed Care, Other (non HMO)

## 2015-12-02 ENCOUNTER — Encounter: Payer: Self-pay | Admitting: Hematology and Oncology

## 2015-12-02 ENCOUNTER — Telehealth: Payer: Self-pay | Admitting: Hematology and Oncology

## 2015-12-02 ENCOUNTER — Telehealth: Payer: Self-pay | Admitting: Family Medicine

## 2015-12-02 ENCOUNTER — Other Ambulatory Visit (HOSPITAL_BASED_OUTPATIENT_CLINIC_OR_DEPARTMENT_OTHER): Payer: Managed Care, Other (non HMO)

## 2015-12-02 VITALS — BP 162/85 | HR 76 | Temp 98.1°F | Ht 66.0 in | Wt 98.9 lb

## 2015-12-02 DIAGNOSIS — D509 Iron deficiency anemia, unspecified: Secondary | ICD-10-CM

## 2015-12-02 DIAGNOSIS — C50412 Malignant neoplasm of upper-outer quadrant of left female breast: Secondary | ICD-10-CM

## 2015-12-02 DIAGNOSIS — T7840XA Allergy, unspecified, initial encounter: Secondary | ICD-10-CM

## 2015-12-02 LAB — IRON AND TIBC
%SAT: 7 % — ABNORMAL LOW (ref 21–57)
Iron: 24 ug/dL — ABNORMAL LOW (ref 41–142)
TIBC: 354 ug/dL (ref 236–444)
UIBC: 330 ug/dL (ref 120–384)

## 2015-12-02 LAB — CBC WITH DIFFERENTIAL/PLATELET
BASO%: 0.7 % (ref 0.0–2.0)
Basophils Absolute: 0 10*3/uL (ref 0.0–0.1)
EOS%: 5.7 % (ref 0.0–7.0)
Eosinophils Absolute: 0.3 10*3/uL (ref 0.0–0.5)
HCT: 34.9 % (ref 34.8–46.6)
HGB: 11.2 g/dL — ABNORMAL LOW (ref 11.6–15.9)
LYMPH%: 9.9 % — ABNORMAL LOW (ref 14.0–49.7)
MCH: 26.4 pg (ref 25.1–34.0)
MCHC: 32.1 g/dL (ref 31.5–36.0)
MCV: 82.3 fL (ref 79.5–101.0)
MONO#: 0.4 10*3/uL (ref 0.1–0.9)
MONO%: 8 % (ref 0.0–14.0)
NEUT#: 3.3 10*3/uL (ref 1.5–6.5)
NEUT%: 75.7 % (ref 38.4–76.8)
Platelets: 149 10*3/uL (ref 145–400)
RBC: 4.24 10*6/uL (ref 3.70–5.45)
RDW: 15.8 % — ABNORMAL HIGH (ref 11.2–14.5)
WBC: 4.4 10*3/uL (ref 3.9–10.3)
lymph#: 0.4 10*3/uL — ABNORMAL LOW (ref 0.9–3.3)

## 2015-12-02 LAB — COMPREHENSIVE METABOLIC PANEL
ALT: 21 U/L (ref 0–55)
AST: 27 U/L (ref 5–34)
Albumin: 3.8 g/dL (ref 3.5–5.0)
Alkaline Phosphatase: 57 U/L (ref 40–150)
Anion Gap: 11 mEq/L (ref 3–11)
BUN: 7.7 mg/dL (ref 7.0–26.0)
CO2: 27 mEq/L (ref 22–29)
Calcium: 9.5 mg/dL (ref 8.4–10.4)
Chloride: 99 mEq/L (ref 98–109)
Creatinine: 0.5 mg/dL — ABNORMAL LOW (ref 0.6–1.1)
EGFR: >90 mL/min/{1.73_m2} (ref >90)
Glucose: 73 mg/dl (ref 70–140)
Potassium: 3.7 mEq/L (ref 3.5–5.1)
Sodium: 136 mEq/L (ref 136–145)
Total Bilirubin: <0.3 mg/dL (ref 0.20–1.20)
Total Protein: 7.3 g/dL (ref 6.4–8.3)

## 2015-12-02 LAB — FERRITIN: Ferritin: 9 ng/ml (ref 9–269)

## 2015-12-02 MED ORDER — SPIRONOLACTONE-HCTZ 25-25 MG PO TABS: 1.0000 | ORAL_TABLET | Freq: Every day | ORAL | Status: DC

## 2015-12-02 MED ORDER — DIPHENHYDRAMINE HCL 50 MG/ML IJ SOLN
25.0000 mg | Freq: Once | INTRAMUSCULAR | Status: AC
  Administered 2015-12-02: 25 mg via INTRAVENOUS

## 2015-12-02 MED ORDER — SODIUM CHLORIDE 0.9 % IV SOLN
Freq: Once | INTRAVENOUS | Status: AC
  Administered 2015-12-02: 10:00:00 via INTRAVENOUS

## 2015-12-02 MED ORDER — FAMOTIDINE IN NACL 20-0.9 MG/50ML-% IV SOLN
20.0000 mg | Freq: Once | INTRAVENOUS | Status: AC
  Administered 2015-12-02: 20 mg via INTRAVENOUS

## 2015-12-02 MED ORDER — FERUMOXYTOL INJECTION 510 MG/17 ML
510.0000 mg | Freq: Once | INTRAVENOUS | Status: AC
  Administered 2015-12-02: 510 mg via INTRAVENOUS
  Filled 2015-12-02: qty 17

## 2015-12-02 MED ORDER — NYSTATIN 100000 UNIT/GM EX POWD: Freq: Four times a day (QID) | CUTANEOUS | Status: DC

## 2015-12-02 MED FILL — NYSTOP 100,000 UNITS/GM PWD: 100000 | 10 days supply | Qty: 15 | Fill #0

## 2015-12-02 NOTE — Assessment & Plan Note (Signed)
Patient is status post neoadjuvant chemotherapy, mastectomy, and radiation treatments.  She is currently undergoing observation only.  She is scheduled to return on 12/09/2015 for her next iron infusion.  She is scheduled to return to the [facility] for follow-up appointment in approximate 6 months which would be around 06/02/2016.  She knows to call in the interim with any new worries or concerns.

## 2015-12-02 NOTE — Telephone Encounter (Signed)
The Maxide is causing the patient to feel nauseous since she started taking it, please advise.

## 2015-12-02 NOTE — Telephone Encounter (Signed)
Pt notified of RX Verbalizes understanding 

## 2015-12-02 NOTE — Progress Notes (Signed)
Patient Care Team: Claretta Fraise, MD as PCP - General (Family Medicine) Dr Geanie Berlin (Gynecology) Holley Bouche, NP as Nurse Practitioner (Nurse Practitioner) Nicholas Lose, MD as Consulting Physician (Hematology and Oncology) Fanny Skates, MD as Consulting Physician (General Surgery) Kyung Rudd, MD as Consulting Physician (Radiation Oncology) Sylvan Cheese, NP as Nurse Practitioner (Hematology and Oncology)  DIAGNOSIS: Breast cancer of upper-outer quadrant of left female breast [facility])   Staging form: Breast, AJCC 7th Edition     Clinical stage from 11/18/2014: Stage IIB (T3, N0, M0) - Unsigned     Pathologic stage from 04/30/2015: Stage IA (T1a, N0, cM0) - Unsigned   SUMMARY OF ONCOLOGIC HISTORY:   Breast cancer of upper-outer quadrant of left female breast ([place])   11/09/2014 Mammogram Left breast irregular hypoechoic mass 12:00 position 5.3 x 3.7 x 2.9 cm, left low axilla normal sized lymph nodes   11/09/2014 Initial Diagnosis Left Bx: IDC with DCIS; Grade 3, ER - (0%), PR- (0%), Her 2 Neg, Ki 67: 70%   11/26/2014 Breast MRI 3.5 x 4.5 x 3.8 cm biopsy-proven malignancy within the upper/ upper inner left breast. Scattered foci adjacent to the biopsy-proven malignancy and within the upper left breast are indeterminate.   11/26/2014 Clinical Stage Stage IIB: T3 N0   11/30/2014 -  Neo-Adjuvant Chemotherapy Dose dense Adriamycin and Cytoxan 4 followed by Abraxane weekly 12   04/05/2015 Breast MRI Breast complete imaging response to neoadjuvant chemotherapy   04/30/2015 Surgery Left mastectomy: IDC 2 mm residual disease, 1 lymph node positive for isolated tumor cells within normal lymphatics. 0/3 Lymph Nodes   04/30/2015 Pathologic Stage Stage IA: pT1a pN0   06/28/2015 - 08/12/2015 Radiation Therapy Adjuvant radiation therapy: Left chest wall and supraclavicular fossa 50.4 Gy over 28 fractions; left chest wall boost 10 Gy over 5 fractions. Total dose 60.4 Gy    CHIEF COMPLIANT: New onset  iron deficiency anemia  INTERVAL HISTORY: Sherry Bender is a 65 year old with above-mentioned history left breast cancer who completed neoadjuvant chemotherapy followed by mastectomy and radiation and is currently on observation. She was noted to have fatigue and was found to be anemic with iron deficiency. She is here today to receive first dose of IV iron treatment.  REVIEW OF SYSTEMS:   Constitutional: Denies fevers, chills or abnormal weight loss Eyes: Denies blurriness of vision Ears, nose, mouth, throat, and face: Denies mucositis or sore throat Respiratory: Denies cough, dyspnea or wheezes Cardiovascular: Denies palpitation, chest discomfort Gastrointestinal:  Denies nausea, heartburn or change in bowel habits Skin: Denies abnormal skin rashes Lymphatics: Denies new lymphadenopathy or easy bruising Neurological:Denies numbness, tingling or new weaknesses Behavioral/Psych: Mood is stable, no new changes  Extremities: No lower extremity edema  All other systems were reviewed with the patient and are negative.  I have reviewed the past medical history, past surgical history, social history and family history with the patient and they are unchanged from previous note.  ALLERGIES:  is allergic to codeine.  MEDICATIONS:  Current Outpatient Prescriptions  Medication Sig Dispense Refill  . lansoprazole (PREVACID) 30 MG capsule Take 1 capsule (30 mg total) by mouth 2 (two) times daily. On an empty stomach. Wait One hour before eating. 180 capsule 3  . non-metallic deodorant (ALRA) MISC Apply 1 application topically daily as needed. Reported on 10/18/2015    . nystatin (NYSTATIN) powder Apply topically 4 (four) times daily. 15 g 0  . tolterodine (DETROL LA) 2 MG 24 hr capsule Take 1 capsule (2 mg total)  by mouth daily. 90 capsule 1  . triamterene-hydrochlorothiazide (MAXZIDE-25) 37.5-25 MG tablet Take 1 tablet by mouth daily. 90 tablet 3   No current facility-administered medications for  this visit.    PHYSICAL EXAMINATION: ECOG PERFORMANCE STATUS: 1 - Symptomatic but completely ambulatory  Filed Vitals:   12/02/15 0849  BP: 162/85  Pulse: 76  Temp: 98.1 F (36.7 C)   Filed Weights   12/02/15 0849  Weight: 98 lb 14.4 oz (44.861 kg)    GENERAL:alert, no distress and comfortable SKIN: skin color, texture, turgor are normal, no rashes or significant lesions EYES: normal, Conjunctiva are pink and non-injected, sclera clear OROPHARYNX:no exudate, no erythema and lips, buccal mucosa, and tongue normal  NECK: supple, thyroid normal size, non-tender, without nodularity LYMPH:  no palpable lymphadenopathy in the cervical, axillary or inguinal LUNGS: clear to auscultation and percussion with normal breathing effort HEART: regular rate & rhythm and no murmurs and no lower extremity edema ABDOMEN:abdomen soft, non-tender and normal bowel sounds MUSCULOSKELETAL:no cyanosis of digits and no clubbing  NEURO: alert & oriented x 3 with fluent speech, no focal motor/sensory deficits EXTREMITIES: No lower extremity edema   LABORATORY DATA:  I have reviewed the data as listed   Chemistry      Component Value Date/Time   NA 136 12/02/2015 0817   NA 136 11/16/2015 1414   NA 133* 04/22/2015 1100   K 3.7 12/02/2015 0817   K 3.3* 11/16/2015 1414   CL 91* 11/16/2015 1414   CO2 27 12/02/2015 0817   CO2 28 11/16/2015 1414   BUN 7.7 12/02/2015 0817   BUN 9 11/16/2015 1414   BUN 10 04/22/2015 1100   CREATININE 0.5* 12/02/2015 0817   CREATININE 0.42* 11/16/2015 1414      Component Value Date/Time   CALCIUM 9.5 12/02/2015 0817   CALCIUM 9.4 11/16/2015 1414   ALKPHOS 57 12/02/2015 0817   ALKPHOS 60 11/16/2015 1414   AST 27 12/02/2015 0817   AST 29 11/16/2015 1414   ALT 21 12/02/2015 0817   ALT 20 11/16/2015 1414   BILITOT <0.30 12/02/2015 0817   BILITOT <0.2 11/16/2015 1414   BILITOT 0.3 04/22/2015 1100       Lab Results  Component Value Date   WBC 4.4 12/02/2015     HGB 11.2* 12/02/2015   HCT 34.9 12/02/2015   MCV 82.3 12/02/2015   PLT 149 12/02/2015   NEUTROABS 3.3 12/02/2015     ASSESSMENT & PLAN:  Breast cancer of upper-outer quadrant of left female breast ([place]) 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  Iron deficiency anemia I instructed the patient that she needs upper endoscopy and colonoscopy. I recommended intravenous iron therapy for 2 doses to help with her symptoms.  Patient tells me that her blood pressures have been elevated because she stopped taking one of her blood pressure medications. I instructed her to call her primary care physician and discuss this further.  No orders of the defined types were placed in this encounter.  The patient has a good understanding of the overall plan. she agrees with it. she will call with any problems that may develop before the next visit here.   Rulon Eisenmenger, MD 12/02/2015

## 2015-12-02 NOTE — Assessment & Plan Note (Signed)
I instructed the patient that she needs upper endoscopy and colonoscopy. I recommended intravenous iron therapy for 2 doses to help with her symptoms.

## 2015-12-02 NOTE — Progress Notes (Signed)
1023-pt began coughing, denied any other symptoms; feraheme paused, pt then began to c/o feeling "fuzzy headed, like my throat is closing up". NS wide open, benadryl 25mg  IV per hypersensitivity protocol given; Selena Lesser NP made aware; additional order for pepcid 20 mg IV given verbally. After feraheme stopped and saline given, pt states she feels "better already", symptoms resolved after benadryl given.

## 2015-12-02 NOTE — Progress Notes (Signed)
The Survivorship Care Plan was hand delivered to and reviewed with Sherry Bender while here to see Dr. Lindi Adie as she reported not being able to come in to the Survivorship Clinic for an in-person visit at this time.  She was encouraged to review the document and to reach out to me with any questions or concerns.  My business card was given to the patient as well.  A copy of the care plan was also routed/faxed/mailed to [facility], MD, the patient's PCP.  I will not be placing any follow-up appointments to the Survivorship Clinic for Sherry Bender, but I am happy to see her at any time in the future for any survivorship concerns that may arise. Thank you for allowing me to participate in her care!  Kenn File, [place] [phone]

## 2015-12-02 NOTE — Addendum Note (Signed)
Addended by: Drue Second R on: 12/02/2015 12:10 PM   Modules accepted: Level of Service

## 2015-12-02 NOTE — Progress Notes (Signed)
Addendum: This was a no charge visit; since patient had already seen Dr. Lindi Adie earlier this morning.

## 2015-12-02 NOTE — Assessment & Plan Note (Signed)
Pt presented to the [facility] for initial cycle of Feraheme for dx'd deficiency anemia.  She developed a mild hypersensitivity reaction to the [facility]; which was managed per protocol.  See further notes for details.  Patient is scheduled to return on 12/09/2015 for a second.  Iron infusion.  She is scheduled to return for follow appear the cancer Center in approximate 6 months-which will be approximately 06/02/2016.

## 2015-12-02 NOTE — Assessment & Plan Note (Signed)
Pt presented to the [facility] for initial cycle of Feraheme for dx'd deficiency anemia.  She developed a mild hypersensitivity reaction to the Feraheme; which consisted of some trace throat swelling sensation only.  Patient denied any other new symptoms whatsoever.  Patient was given Benadryl 25 mg and Pepcid 20 mg per hypersensitivity protocol; all symptoms completely resolved.   Patient was advised to call/return or go directly to the emergency department for any worsening symptoms whatsoever.

## 2015-12-02 NOTE — Telephone Encounter (Signed)
The requested med has been sent to the pharmacy.  Please let the patient know. Thanks, WS 

## 2015-12-02 NOTE — Patient Instructions (Signed)

## 2015-12-02 NOTE — Telephone Encounter (Signed)
appt made and avs printed °

## 2015-12-02 NOTE — Progress Notes (Signed)
SYMPTOM MANAGEMENT CLINIC    Chief Complaint: Hypersensitivity reaction  HPI:  Sherry Bender 65 y.o. female diagnosed with breast cancer and iron deficiency anemia.  Patient is status post chemotherapy, radiation, and mastectomy.  Currently undergoing Feraheme infusions on an as-needed basis.   Pt presented to the [facility] for initial cycle of Feraheme for dx'd deficiency anemia.  She developed a mild hypersensitivity reaction to the Feraheme; which consisted of some trace throat swelling sensation only.  Patient denied any other new symptoms whatsoever.  Patient was given Benadryl 25 mg and Pepcid 20 mg per hypersensitivity protocol; all symptoms completely resolved.   Patient was advised to call/return or go directly to the emergency department for any worsening symptoms whatsoever.    Breast cancer of upper-outer quadrant of left female breast ([place])   11/09/2014 Mammogram Left breast irregular hypoechoic mass 12:00 position 5.3 x 3.7 x 2.9 cm, left low axilla normal sized lymph nodes   11/09/2014 Initial Diagnosis Left Bx: IDC with DCIS; Grade 3, ER - (0%), PR- (0%), Her 2 Neg, Ki 67: 70%   11/26/2014 Breast MRI 3.5 x 4.5 x 3.8 cm biopsy-proven malignancy within the upper/ upper inner left breast. Scattered foci adjacent to the biopsy-proven malignancy and within the upper left breast are indeterminate.   11/26/2014 Clinical Stage Stage IIB: T3 N0   11/30/2014 -  Neo-Adjuvant Chemotherapy Dose dense Adriamycin and Cytoxan 4 followed by Abraxane weekly 12   04/05/2015 Breast MRI Breast complete imaging response to neoadjuvant chemotherapy   04/30/2015 Surgery Left mastectomy: IDC 2 mm residual disease, 1 lymph node positive for isolated tumor cells within normal lymphatics. 0/3 Lymph Nodes   04/30/2015 Pathologic Stage Stage IA: pT1a pN0   06/28/2015 - 08/12/2015 Radiation Therapy Adjuvant radiation therapy: Left chest wall and supraclavicular fossa 50.4 Gy over 28 fractions; left chest  wall boost 10 Gy over 5 fractions. Total dose 60.4 Gy    Review of Systems  HENT:       Patient complained of feeling a sensation of a lump in the throat only.  All other systems reviewed and are negative.   Past Medical History  Diagnosis Date  . Hyperlipidemia   . Hypertension   . Complication of anesthesia     "HARD TO WAKE UP".......Marland KitchenN/V  . PONV (postoperative nausea and vomiting)   . GERD (gastroesophageal reflux disease)   . Arthritis     IN BACK....  . Scoliosis   . 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 Anxiety   . Urinary frequency   . Breast cancer ([place]) 2016    Past Surgical History  Procedure Laterality Date  . Cholecystectomy  1994  . Abdominal hysterectomy  2000    TAH/BSO  . Knee arthroscopy  2005    right knee  . Foot neuroma surgery  01/2010    also had metatarasl shortened  . Rhinoplasty    . Cervical fusion    . Anterior cervical decomp/discectomy fusion N/A 06/18/2014    Procedure: C4-5 C5-6 C6-7 ANTERIOR CERVICAL DECOMPRESSION/DISKECTOMY/FUSION;  Surgeon: Eustace Moore, MD;  Location: [facility];  Service: Neurosurgery;  Laterality: N/A;  C4-5 C5-6 C6-7 ANTERIOR CERVICAL DECOMPRESSION/DISKECTOMY/FUSION  . Anterior cervical decomp/discectomy fusion N/A 06/25/2014    Procedure: EXPLORATION OF CERVICAL FUSION WITH REVIOSN OF HARDWARE;  Surgeon: Eustace Moore, MD;  Location: [place];  Service: Neurosurgery;  Laterality: N/A;  . Portacath placement N/A 11/23/2014    Procedure: INSERTION PORT-A-CATH  WITH ULTRASOUND;  Surgeon: Fanny Skates, MD;  Location: [place];  Service: General;  Laterality: N/A;  . Complete mastectomy w/ sentinel node biopsy Left 04/30/2015  . Simple mastectomy with axillary sentinel node biopsy Left 04/30/2015    Procedure: LEFT TOTAL  MASTECTOMY WITH LEFT AXILLARY SENTINEL NODE BIOPSY;  Surgeon: Fanny Skates, MD;  Location: [facility];  Service: General;  Laterality: Left;  . Port-a-cath removal Right 04/30/2015     Procedure: REMOVAL PORT-A-CATH;  Surgeon: Fanny Skates, MD;  Location: [facility];  Service: General;  Laterality: Right;    has Hyperlipidemia; Nodular goiter; GERD (gastroesophageal reflux disease); Degenerative disc disease; Allergic rhinitis; Anxiety disorder; Cyst, breast; HTN (hypertension); Tobacco abuse; S/P cervical spinal fusion; Breast cancer of upper-outer quadrant of left female breast ([place]); Iron deficiency anemia; and Hypersensitivity reaction on her problem list.    is allergic to codeine.    Medication List       This list is accurate as of: 12/02/15 12:06 PM.  Always use your most recent med list.               lansoprazole 30 MG capsule  Commonly known as:  PREVACID  Take 1 capsule (30 mg total) by mouth 2 (two) times daily. On an empty stomach. Wait One hour before eating.     non-metallic deodorant Misc  Commonly known as:  ALRA  Apply 1 application topically daily as needed. Reported on 10/18/2015     nystatin powder  Commonly known as:  nystatin  Apply topically 4 (four) times daily.     tolterodine 2 MG 24 hr capsule  Commonly known as:  DETROL LA  Take 1 capsule (2 mg total) by mouth daily.     triamterene-hydrochlorothiazide 37.5-25 MG tablet  Commonly known as:  MAXZIDE-25  Take 1 tablet by mouth daily.         PHYSICAL EXAMINATION  Oncology Vitals 12/02/2015 11/16/2015  Height 168 cm 168 cm  Weight 44.861 kg 44.453 kg  Weight (lbs) 98 lbs 14 oz 98 lbs  BMI (kg/m2) 15.96 kg/m2 15.82 kg/m2  Temp 98.1 97.1  Pulse 76 80  Resp - -  SpO2 100 -  BSA (m2) 1.45 m2 1.44 m2   BP Readings from Last 2 Encounters:  12/02/15 162/85  11/16/15 145/75    Physical Exam  Constitutional: She is oriented to person, place, and time and well-developed, well-nourished, and in no distress.  HENT:  Head: Normocephalic and atraumatic.  Mouth/Throat: Oropharynx is clear and moist.  Eyes: Conjunctivae and EOM are normal. Pupils are equal, round, and reactive  to light. Right eye exhibits no discharge. Left eye exhibits no discharge. No scleral icterus.  Neck: Normal range of motion.  Pulmonary/Chest: Effort normal. No stridor. No respiratory distress.  Musculoskeletal: Normal range of motion.  Neurological: She is alert and oriented to person, place, and time. Gait normal.  Skin: Skin is warm and dry.  Psychiatric: Affect normal.  Nursing note and vitals reviewed.   LABORATORY DATA:. Appointment on 12/02/2015  Component Date Value Ref Range Status  . WBC 12/02/2015 4.4  3.9 - 10.3 10e3/uL Final  . NEUT# 12/02/2015 3.3  1.5 - 6.5 10e3/uL Final  . HGB 12/02/2015 11.2* 11.6 - 15.9 g/dL Final  . HCT 12/02/2015 34.9  34.8 - 46.6 % Final  . Platelets 12/02/2015 149  145 - 400 10e3/uL Final  . MCV 12/02/2015 82.3  79.5 - 101.0 fL Final  . MCH 12/02/2015 26.4  25.1 -  34.0 pg Final  . MCHC 12/02/2015 32.1  31.5 - 36.0 g/dL Final  . RBC 12/02/2015 4.24  3.70 - 5.45 10e6/uL Final  . RDW 12/02/2015 15.8* 11.2 - 14.5 % Final  . lymph# 12/02/2015 0.4* 0.9 - 3.3 10e3/uL Final  . MONO# 12/02/2015 0.4  0.1 - 0.9 10e3/uL Final  . Eosinophils Absolute 12/02/2015 0.3  0.0 - 0.5 10e3/uL Final  . Basophils Absolute 12/02/2015 0.0  0.0 - 0.1 10e3/uL Final  . NEUT% 12/02/2015 75.7  38.4 - 76.8 % Final  . LYMPH% 12/02/2015 9.9* 14.0 - 49.7 % Final  . MONO% 12/02/2015 8.0  0.0 - 14.0 % Final  . EOS% 12/02/2015 5.7  0.0 - 7.0 % Final  . BASO% 12/02/2015 0.7  0.0 - 2.0 % Final  . Sodium 12/02/2015 136  136 - 145 mEq/L Final  . Potassium 12/02/2015 3.7  3.5 - 5.1 mEq/L Final  . Chloride 12/02/2015 99  98 - 109 mEq/L Final  . CO2 12/02/2015 27  22 - 29 mEq/L Final  . Glucose 12/02/2015 73  70 - 140 mg/dl Final   Glucose reference range is for nonfasting patients. Fasting glucose reference range is 70- 100.  Marland Kitchen BUN 12/02/2015 7.7  7.0 - 26.0 mg/dL Final  . Creatinine 12/02/2015 0.5* 0.6 - 1.1 mg/dL Final  . Total Bilirubin 12/02/2015 <0.30  0.20 - 1.20 mg/dL  Final  . Alkaline Phosphatase 12/02/2015 57  40 - 150 U/L Final  . AST 12/02/2015 27  5 - 34 U/L Final  . ALT 12/02/2015 21  0 - 55 U/L Final  . Total Protein 12/02/2015 7.3  6.4 - 8.3 g/dL Final  . Albumin 12/02/2015 3.8  3.5 - 5.0 g/dL Final  . Calcium 12/02/2015 9.5  8.4 - 10.4 mg/dL Final  . Anion Gap 12/02/2015 11  3 - 11 mEq/L Final  . EGFR 12/02/2015 >90  >90 ml/min/1.73 m2 Final   eGFR is calculated using the CKD-EPI Creatinine Equation (2009)  . Ferritin 12/02/2015 9  9 - 269 ng/ml Final  . Iron 12/02/2015 24* 41 - 142 ug/dL Final  . TIBC 12/02/2015 354  236 - 444 ug/dL Final  . UIBC 12/02/2015 330  120 - 384 ug/dL Final  . %SAT 12/02/2015 7* 21 - 57 % Final    RADIOGRAPHIC STUDIES: No results found.  ASSESSMENT/PLAN:    Iron deficiency anemia Pt presented to the [facility] for initial cycle of Feraheme for dx'd deficiency anemia.  She developed a mild hypersensitivity reaction to the [facility]; which was managed per protocol.  See further notes for details.  Patient is scheduled to return on 12/09/2015 for a second.  Iron infusion.  She is scheduled to return for follow appear the cancer Center in approximate 6 months-which will be approximately 06/02/2016.  Hypersensitivity reaction Pt presented to the [facility] [place] [facility] for initial cycle of Feraheme for dx'd deficiency anemia.  She developed a mild hypersensitivity reaction to the Feraheme; which consisted of some trace throat swelling sensation only.  Patient denied any other new symptoms whatsoever.  Patient was given Benadryl 25 mg and Pepcid 20 mg per hypersensitivity protocol; all symptoms completely resolved.   Patient was advised to call/return or go directly to the emergency department for any worsening symptoms whatsoever.     Breast cancer of upper-outer quadrant of left female breast [facility]) Patient is status post neoadjuvant chemotherapy, mastectomy, and radiation treatments.  She is currently undergoing  observation only.  She is scheduled to return on  12/09/2015 for her next iron infusion.  She is scheduled to return to the [place] [facility] for follow-up appointment in approximate 6 months which would be around 06/02/2016.  She knows to call in the interim with any new worries or concerns.   Patient stated understanding of all instructions; and was in agreement with this plan of care. The patient knows to call the clinic with any problems, questions or concerns.   Total time spent with patient was 15 minutes;  with greater than 75 percent of that time spent in face to face counseling regarding patient's symptoms,  and coordination of care and follow up.  Disclaimer:This dictation was prepared with Dragon/digital dictation along with [employer]. Any transcriptional errors that result from this process are unintentional.  Drue Second, NP 12/02/2015

## 2015-12-02 NOTE — Assessment & Plan Note (Signed)
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

## 2015-12-06 ENCOUNTER — Encounter: Payer: Managed Care, Other (non HMO) | Admitting: Nurse Practitioner

## 2015-12-06 ENCOUNTER — Telehealth: Payer: Self-pay | Admitting: Family Medicine

## 2015-12-06 MED ORDER — HYDROCHLOROTHIAZIDE 25 MG PO TABS: 25.0000 mg | ORAL_TABLET | Freq: Every day | ORAL | Status: DC

## 2015-12-06 NOTE — Telephone Encounter (Signed)
If she feels like this is causing her issues then she can stop the medication but she needs to come in and be seen within the next couple days so we can get her started on something else for her blood pressure. Have her check some home blood pressure readings in the meantime.

## 2015-12-06 NOTE — Telephone Encounter (Signed)
Patient wants to know if we can just put her back on the HCTZ she was on. Patient states that she has to go to oncology appointments this week.

## 2015-12-06 NOTE — Telephone Encounter (Signed)
Yes go ahead and send hctz 25 mg for 1 month and have her f/u in the week or 2 for bp recheck

## 2015-12-06 NOTE — Telephone Encounter (Signed)
Patient aware.

## 2015-12-06 NOTE — Telephone Encounter (Signed)
Please review and advise and route to Pool A. Patient of Dr Livia Snellen

## 2015-12-09 ENCOUNTER — Ambulatory Visit (HOSPITAL_BASED_OUTPATIENT_CLINIC_OR_DEPARTMENT_OTHER): Payer: Managed Care, Other (non HMO)

## 2015-12-09 VITALS — BP 123/47 | HR 77 | Temp 98.2°F | Resp 16

## 2015-12-09 DIAGNOSIS — D509 Iron deficiency anemia, unspecified: Secondary | ICD-10-CM

## 2015-12-09 MED ORDER — SODIUM CHLORIDE 0.9 % IV SOLN
510.0000 mg | Freq: Once | INTRAVENOUS | Status: AC
  Administered 2015-12-09: 510 mg via INTRAVENOUS
  Filled 2015-12-09: qty 17

## 2015-12-09 MED ORDER — ACETAMINOPHEN 325 MG PO TABS
650.0000 mg | ORAL_TABLET | Freq: Once | ORAL | Status: AC
  Administered 2015-12-09: 650 mg via ORAL

## 2015-12-09 MED ORDER — DIPHENHYDRAMINE HCL 50 MG/ML IJ SOLN
25.0000 mg | Freq: Once | INTRAMUSCULAR | Status: AC
  Administered 2015-12-09: 25 mg via INTRAVENOUS

## 2015-12-09 MED ORDER — ACETAMINOPHEN 325 MG PO TABS
ORAL_TABLET | ORAL | Status: AC
  Filled 2015-12-09: qty 2

## 2015-12-09 MED ORDER — SODIUM CHLORIDE 0.9 % IV SOLN
Freq: Once | INTRAVENOUS | Status: AC
  Administered 2015-12-09: 14:00:00 via INTRAVENOUS

## 2015-12-09 MED ORDER — DIPHENHYDRAMINE HCL 50 MG/ML IJ SOLN
INTRAMUSCULAR | Status: AC
  Filled 2015-12-09: qty 1

## 2015-12-09 NOTE — Patient Instructions (Signed)

## 2015-12-09 NOTE — Progress Notes (Signed)
Pt had cough and sensation of "throat swelling" during first Feraheme infusion. Resolved with Benadryl and Pepcid. Per Dr. Lindi Adie: Pre-medicate for feraheme with Tylenol 650 mg and Benadryl 25 mg.

## 2015-12-22 MED FILL — DEXILANT DR 60 MG CAPSULE: 60 | 30 days supply | Qty: 30 | Fill #2

## 2016-01-03 ENCOUNTER — Ambulatory Visit (INDEPENDENT_AMBULATORY_CARE_PROVIDER_SITE_OTHER): Payer: Managed Care, Other (non HMO) | Admitting: Family

## 2016-01-03 ENCOUNTER — Encounter: Payer: Self-pay | Admitting: Family

## 2016-01-03 VITALS — BP 148/77 | HR 89 | Temp 97.1°F | Ht 66.0 in | Wt 96.8 lb

## 2016-01-03 DIAGNOSIS — E785 Hyperlipidemia, unspecified: Secondary | ICD-10-CM

## 2016-01-03 DIAGNOSIS — Z720 Tobacco use: Secondary | ICD-10-CM

## 2016-01-03 DIAGNOSIS — C50412 Malignant neoplasm of upper-outer quadrant of left female breast: Secondary | ICD-10-CM | POA: Diagnosis not present

## 2016-01-03 DIAGNOSIS — I10 Essential (primary) hypertension: Secondary | ICD-10-CM

## 2016-01-03 DIAGNOSIS — J309 Allergic rhinitis, unspecified: Secondary | ICD-10-CM

## 2016-01-03 DIAGNOSIS — K219 Gastro-esophageal reflux disease without esophagitis: Secondary | ICD-10-CM

## 2016-01-03 MED ORDER — TOLTERODINE TARTRATE ER 2 MG PO CP24: 2.0000 mg | ORAL_CAPSULE | Freq: Every day | ORAL | Status: DC

## 2016-01-03 MED ORDER — HYDROCHLOROTHIAZIDE 25 MG PO TABS: 25.0000 mg | ORAL_TABLET | Freq: Every day | ORAL | Status: DC

## 2016-01-03 MED ORDER — NYSTATIN 100000 UNIT/GM EX POWD: Freq: Four times a day (QID) | CUTANEOUS | Status: DC

## 2016-01-03 NOTE — Patient Instructions (Signed)
Health Maintenance, Female Adopting a healthy lifestyle and getting preventive care can go a long way to promote health and wellness. Talk with your health care provider about what schedule of regular examinations is right for you. This is a good chance for you to check in with your provider about disease prevention and staying healthy. In between checkups, there are plenty of things you can do on your own. Experts have done a lot of research about which lifestyle changes and preventive measures are most likely to keep you healthy. Ask your health care provider for more information. WEIGHT AND DIET  Eat a healthy diet  Be sure to include plenty of vegetables, fruits, low-fat dairy products, and lean protein.  Do not eat a lot of foods high in solid fats, added sugars, or salt.  Get regular exercise. This is one of the most important things you can do for your health.  Most adults should exercise for at least 150 minutes each week. The exercise should increase your heart rate and make you sweat (moderate-intensity exercise).  Most adults should also do strengthening exercises at least twice a week. This is in addition to the moderate-intensity exercise.  Maintain a healthy weight  Body mass index (BMI) is a measurement that can be used to identify possible weight problems. It estimates body fat based on height and weight. Your health care provider can help determine your BMI and help you achieve or maintain a healthy weight.  For females 20 years of age and older:   A BMI below 18.5 is considered underweight.  A BMI of 18.5 to 24.9 is normal.  A BMI of 25 to 29.9 is considered overweight.  A BMI of 30 and above is considered obese.  Watch levels of cholesterol and blood lipids  You should start having your blood tested for lipids and cholesterol at 65 years of age, then have this test every 5 years.  You may need to have your cholesterol levels checked more often if:  Your lipid  or cholesterol levels are high.  You are older than 65 years of age.  You are at high risk for heart disease.  CANCER SCREENING   Lung Cancer  Lung cancer screening is recommended for adults 55-80 years old who are at high risk for lung cancer because of a history of smoking.  A yearly low-dose CT scan of the lungs is recommended for people who:  Currently smoke.  Have quit within the past 15 years.  Have at least a 30-pack-year history of smoking. A pack year is smoking an average of one pack of cigarettes a day for 1 year.  Yearly screening should continue until it has been 15 years since you quit.  Yearly screening should stop if you develop a health problem that would prevent you from having lung cancer treatment.  Breast Cancer  Practice breast self-awareness. This means understanding how your breasts normally appear and feel.  It also means doing regular breast self-exams. Let your health care provider know about any changes, no matter how small.  If you are in your 20s or 30s, you should have a clinical breast exam (CBE) by a health care provider every 1-3 years as part of a regular health exam.  If you are 40 or older, have a CBE every year. Also consider having a breast X-ray (mammogram) every year.  If you have a family history of breast cancer, talk to your health care provider about genetic screening.  If you   are at high risk for breast cancer, talk to your health care provider about having an MRI and a mammogram every year.  Breast cancer gene (BRCA) assessment is recommended for women who have family members with BRCA-related cancers. BRCA-related cancers include:  Breast.  Ovarian.  Tubal.  Peritoneal cancers.  Results of the assessment will determine the need for genetic counseling and BRCA1 and BRCA2 testing. Cervical Cancer Your health care provider may recommend that you be screened regularly for cancer of the pelvic organs (ovaries, uterus, and  vagina). This screening involves a pelvic examination, including checking for microscopic changes to the surface of your cervix (Pap test). You may be encouraged to have this screening done every 3 years, beginning at age 21.  For women ages 30-65, health care providers may recommend pelvic exams and Pap testing every 3 years, or they may recommend the Pap and pelvic exam, combined with testing for human papilloma virus (HPV), every 5 years. Some types of HPV increase your risk of cervical cancer. Testing for HPV may also be done on women of any age with unclear Pap test results.  Other health care providers may not recommend any screening for nonpregnant women who are considered low risk for pelvic cancer and who do not have symptoms. Ask your health care provider if a screening pelvic exam is right for you.  If you have had past treatment for cervical cancer or a condition that could lead to cancer, you need Pap tests and screening for cancer for at least 20 years after your treatment. If Pap tests have been discontinued, your risk factors (such as having a new sexual partner) need to be reassessed to determine if screening should resume. Some women have medical problems that increase the chance of getting cervical cancer. In these cases, your health care provider may recommend more frequent screening and Pap tests. Colorectal Cancer  This type of cancer can be detected and often prevented.  Routine colorectal cancer screening usually begins at 65 years of age and continues through 65 years of age.  Your health care provider may recommend screening at an earlier age if you have risk factors for colon cancer.  Your health care provider may also recommend using home test kits to check for hidden blood in the stool.  A small camera at the end of a tube can be used to examine your colon directly (sigmoidoscopy or colonoscopy). This is done to check for the earliest forms of colorectal  cancer.  Routine screening usually begins at age 50.  Direct examination of the colon should be repeated every 5-10 years through 65 years of age. However, you may need to be screened more often if early forms of precancerous polyps or small growths are found. Skin Cancer  Check your skin from head to toe regularly.  Tell your health care provider about any new moles or changes in moles, especially if there is a change in a mole's shape or color.  Also tell your health care provider if you have a mole that is larger than the size of a pencil eraser.  Always use sunscreen. Apply sunscreen liberally and repeatedly throughout the day.  Protect yourself by wearing long sleeves, pants, a wide-brimmed hat, and sunglasses whenever you are outside. HEART DISEASE, DIABETES, AND HIGH BLOOD PRESSURE   High blood pressure causes heart disease and increases the risk of stroke. High blood pressure is more likely to develop in:  People who have blood pressure in the high end   of the normal range (130-139/85-89 mm Hg).  People who are overweight or obese.  People who are African American.  If you are 38-23 years of age, have your blood pressure checked every 3-5 years. If you are 61 years of age or older, have your blood pressure checked every year. You should have your blood pressure measured twice--once when you are at a hospital or clinic, and once when you are not at a hospital or clinic. Record the average of the two measurements. To check your blood pressure when you are not at a hospital or clinic, you can use:  An automated blood pressure machine at a pharmacy.  A home blood pressure monitor.  If you are between 45 years and 39 years old, ask your health care provider if you should take aspirin to prevent strokes.  Have regular diabetes screenings. This involves taking a blood sample to check your fasting blood sugar level.  If you are at a normal weight and have a low risk for diabetes,  have this test once every three years after 65 years of age.  If you are overweight and have a high risk for diabetes, consider being tested at a younger age or more often. PREVENTING INFECTION  Hepatitis B  If you have a higher risk for hepatitis B, you should be screened for this virus. You are considered at high risk for hepatitis B if:  You were born in a country where hepatitis B is common. Ask your health care provider which countries are considered high risk.  Your parents were born in a high-risk country, and you have not been immunized against hepatitis B (hepatitis B vaccine).  You have HIV or AIDS.  You use needles to inject street drugs.  You live with someone who has hepatitis B.  You have had sex with someone who has hepatitis B.  You get hemodialysis treatment.  You take certain medicines for conditions, including cancer, organ transplantation, and autoimmune conditions. Hepatitis C  Blood testing is recommended for:  Everyone born from [birth year] through 1965.  Anyone with known risk factors for hepatitis C. Sexually transmitted infections (STIs)  You should be screened for sexually transmitted infections (STIs) including gonorrhea and chlamydia if:  You are sexually active and are younger than 65 years of age.  You are older than 65 years of age and your health care provider tells you that you are at risk for this type of infection.  Your sexual activity has changed since you were last screened and you are at an increased risk for chlamydia or gonorrhea. Ask your health care provider if you are at risk.  If you do not have HIV, but are at risk, it may be recommended that you take a prescription medicine daily to prevent HIV infection. This is called pre-exposure prophylaxis (PrEP). You are considered at risk if:  You are sexually active and do not regularly use condoms or know the HIV status of your partner(s).  You take drugs by injection.  You are sexually  active with a partner who has HIV. Talk with your health care provider about whether you are at high risk of being infected with HIV. If you choose to begin PrEP, you should first be tested for HIV. You should then be tested every 3 months for as long as you are taking PrEP.  PREGNANCY   If you are premenopausal and you may become pregnant, ask your health care provider about preconception counseling.  If you may  become pregnant, take 400 to 800 micrograms (mcg) of folic acid every day.  If you want to prevent pregnancy, talk to your health care provider about birth control (contraception). OSTEOPOROSIS AND MENOPAUSE   Osteoporosis is a disease in which the bones lose minerals and strength with aging. This can result in serious bone fractures. Your risk for osteoporosis can be identified using a bone density scan.  If you are 61 years of age or older, or if you are at risk for osteoporosis and fractures, ask your health care provider if you should be screened.  Ask your health care provider whether you should take a calcium or vitamin D supplement to lower your risk for osteoporosis.  Menopause may have certain physical symptoms and risks.  Hormone replacement therapy may reduce some of these symptoms and risks. Talk to your health care provider about whether hormone replacement therapy is right for you.  HOME CARE INSTRUCTIONS   Schedule regular health, dental, and eye exams.  Stay current with your immunizations.   Do not use any tobacco products including cigarettes, chewing tobacco, or electronic cigarettes.  If you are pregnant, do not drink alcohol.  If you are breastfeeding, limit how much and how often you drink alcohol.  Limit alcohol intake to no more than 1 drink per day for nonpregnant women. One drink equals 12 ounces of beer, 5 ounces of wine, or 1 ounces of hard liquor.  Do not use street drugs.  Do not share needles.  Ask your health care provider for help if  you need support or information about quitting drugs.  Tell your health care provider if you often feel depressed.  Tell your health care provider if you have ever been abused or do not feel safe at home.   This information is not intended to replace advice given to you by your health care provider. Make sure you discuss any questions you have with your health care provider.   Document Released: 12/19/2010 Document Revised: 06/26/2014 Document Reviewed: 05/07/2013 Elsevier Interactive Patient Education [employer].

## 2016-01-03 NOTE — Progress Notes (Signed)
Subjective:    Patient ID: Sherry Bender, female    DOB: 11/28/1950, 65 y.o.   MRN: [id number]  Pt presents to the office today for chronic follow up. PT is followed by Oncologists every 3 months for breast cancer.  Hypertension This is a chronic problem. The current episode started more than 1 year ago. The problem has been waxing and waning since onset. The problem is uncontrolled. Associated symptoms include anxiety. Pertinent negatives include no headaches, palpitations, peripheral edema or shortness of breath. Risk factors for coronary artery disease include dyslipidemia, post-menopausal state, sedentary lifestyle, smoking/tobacco exposure and family history. Past treatments include diuretics. The current treatment provides moderate improvement. There is no history of kidney disease, CAD/MI, CVA or heart failure.  Hyperlipidemia This is a chronic problem. The current episode started more than 1 year ago. The problem is controlled. Recent lipid tests were reviewed and are normal. She has no history of obesity. Pertinent negatives include no shortness of breath. Current antihyperlipidemic treatment includes diet change. The current treatment provides mild improvement of lipids. Risk factors for coronary artery disease include dyslipidemia, family history, hypertension, a sedentary lifestyle and post-menopausal.  Gastroesophageal Reflux She reports no belching, no coughing or no heartburn. This is a chronic problem. The current episode started more than 1 year ago. The problem occurs rarely. The problem has been resolved. The symptoms are aggravated by lying down. Risk factors include smoking/tobacco exposure. She has tried a PPI for the symptoms. The treatment provided moderate relief.      Review of Systems  Constitutional: Negative.   HENT: Negative.   Eyes: Negative.   Respiratory: Negative.  Negative for cough and shortness of breath.   Cardiovascular: Negative.  Negative for  palpitations.  Gastrointestinal: Negative.  Negative for heartburn.  Endocrine: Negative.   Genitourinary: Negative.   Musculoskeletal: Negative.   Neurological: Negative.  Negative for headaches.  Hematological: Negative.   Psychiatric/Behavioral: Negative.   All other systems reviewed and are negative.      Objective:   Physical Exam  Constitutional: She is oriented to person, place, and time. She appears well-developed. No distress.  Underweight   HENT:  Head: Normocephalic and atraumatic.  Eyes: Pupils are equal, round, and reactive to light.  Neck: Normal range of motion. Neck supple. No thyromegaly present.  Cardiovascular: Normal rate, regular rhythm, normal heart sounds and intact distal pulses.   No murmur heard. Pulmonary/Chest: Effort normal. No respiratory distress. She has no wheezes.  Diminished breath sounds   Abdominal: Soft. Bowel sounds are normal. She exhibits no distension. There is no tenderness.  Musculoskeletal: Normal range of motion. She exhibits no edema or tenderness.  Neurological: She is alert and oriented to person, place, and time. She has normal reflexes. No cranial nerve deficit.  Skin: Skin is warm and dry.  Psychiatric: She has a normal mood and affect. Her behavior is normal. Judgment and thought content normal.  Vitals reviewed.  BP 148/77 mmHg  Pulse 89  Temp(Src) 97.1 F (36.2 C) (Oral)  Ht 5\' 6"  (1.676 m)  Wt 96 lb 12.8 oz (43.908 kg)  BMI 15.63 kg/m2  LMP 02/28/1999        Assessment & Plan:  1. Breast cancer of upper-outer quadrant of left female breast (HCC) - nystatin (NYSTATIN) powder; Apply topically 4 (four) times daily.  Dispense: 15 g; Refill: 0  2. Essential hypertension - hydrochlorothiazide (HYDRODIURIL) 25 MG tablet; Take 1 tablet (25 mg total) by mouth daily.  Dispense: 90 tablet;  Refill: 3 - tolterodine (DETROL LA) 2 MG 24 hr capsule; Take 1 capsule (2 mg total) by mouth daily.  Dispense: 90 capsule; Refill:  3  3. Gastroesophageal reflux disease, esophagitis presence not specified  4. Tobacco abuse  5. Hyperlipidemia  6. Allergic rhinitis, unspecified allergic rhinitis type   Continue all meds Labs pending Health Maintenance reviewed Diet and exercise encouraged RTO 6 months  Evelina Dun, FNP

## 2016-01-20 MED FILL — DEXILANT DR 60 MG CAPSULE: 60 | 30 days supply | Qty: 30 | Fill #3

## 2016-02-22 MED FILL — DEXILANT DR 60 MG CAPSULE: 60 | 30 days supply | Qty: 30 | Fill #4

## 2016-03-13 ENCOUNTER — Ambulatory Visit (HOSPITAL_BASED_OUTPATIENT_CLINIC_OR_DEPARTMENT_OTHER): Payer: Managed Care, Other (non HMO) | Admitting: Hematology and Oncology

## 2016-03-13 ENCOUNTER — Encounter: Payer: Self-pay | Admitting: Hematology and Oncology

## 2016-03-13 ENCOUNTER — Other Ambulatory Visit (HOSPITAL_BASED_OUTPATIENT_CLINIC_OR_DEPARTMENT_OTHER): Payer: Managed Care, Other (non HMO)

## 2016-03-13 VITALS — BP 136/97 | HR 83 | Temp 98.2°F | Resp 17 | Wt 96.8 lb

## 2016-03-13 DIAGNOSIS — D509 Iron deficiency anemia, unspecified: Secondary | ICD-10-CM

## 2016-03-13 DIAGNOSIS — I10 Essential (primary) hypertension: Secondary | ICD-10-CM | POA: Diagnosis not present

## 2016-03-13 DIAGNOSIS — C50412 Malignant neoplasm of upper-outer quadrant of left female breast: Secondary | ICD-10-CM

## 2016-03-13 DIAGNOSIS — Z23 Encounter for immunization: Secondary | ICD-10-CM

## 2016-03-13 DIAGNOSIS — R634 Abnormal weight loss: Secondary | ICD-10-CM

## 2016-03-13 DIAGNOSIS — E876 Hypokalemia: Secondary | ICD-10-CM

## 2016-03-13 LAB — CBC WITH DIFFERENTIAL/PLATELET
BASO%: 0.7 % (ref 0.0–2.0)
Basophils Absolute: 0 10*3/uL (ref 0.0–0.1)
EOS%: 4.9 % (ref 0.0–7.0)
Eosinophils Absolute: 0.2 10*3/uL (ref 0.0–0.5)
HCT: 36.4 % (ref 34.8–46.6)
HGB: 12.5 g/dL (ref 11.6–15.9)
LYMPH%: 13.2 % — ABNORMAL LOW (ref 14.0–49.7)
MCH: 32.8 pg (ref 25.1–34.0)
MCHC: 34.3 g/dL (ref 31.5–36.0)
MCV: 95.6 fL (ref 79.5–101.0)
MONO#: 0.4 10*3/uL (ref 0.1–0.9)
MONO%: 10.6 % (ref 0.0–14.0)
NEUT#: 2.5 10*3/uL (ref 1.5–6.5)
NEUT%: 70.6 % (ref 38.4–76.8)
Platelets: 162 10*3/uL (ref 145–400)
RBC: 3.81 10*6/uL (ref 3.70–5.45)
RDW: 13.7 % (ref 11.2–14.5)
WBC: 3.6 10*3/uL — ABNORMAL LOW (ref 3.9–10.3)
lymph#: 0.5 10*3/uL — ABNORMAL LOW (ref 0.9–3.3)

## 2016-03-13 LAB — COMPREHENSIVE METABOLIC PANEL
ALT: 24 U/L (ref 0–55)
AST: 25 U/L (ref 5–34)
Albumin: 3.6 g/dL (ref 3.5–5.0)
Alkaline Phosphatase: 56 U/L (ref 40–150)
Anion Gap: 11 mEq/L (ref 3–11)
BUN: 9 mg/dL (ref 7.0–26.0)
CO2: 29 mEq/L (ref 22–29)
Calcium: 9.2 mg/dL (ref 8.4–10.4)
Chloride: 95 mEq/L — ABNORMAL LOW (ref 98–109)
Creatinine: 0.5 mg/dL — ABNORMAL LOW (ref 0.6–1.1)
EGFR: >90 mL/min/{1.73_m2} (ref >90)
Glucose: 68 mg/dl — ABNORMAL LOW (ref 70–140)
Potassium: 3 mEq/L — CL (ref 3.5–5.1)
Sodium: 135 mEq/L — ABNORMAL LOW (ref 136–145)
Total Bilirubin: 0.31 mg/dL (ref 0.20–1.20)
Total Protein: 6.8 g/dL (ref 6.4–8.3)

## 2016-03-13 MED ORDER — DEXILANT 60 MG PO CPDR: 60.0000 mg | DELAYED_RELEASE_CAPSULE | Freq: Every day | ORAL | 6 refills | Status: DC

## 2016-03-13 MED ORDER — INFLUENZA VAC SPLIT QUAD 0.5 ML IM SUSY
0.5000 mL | PREFILLED_SYRINGE | Freq: Once | INTRAMUSCULAR | Status: AC
  Administered 2016-03-13: 0.5 mL via INTRAMUSCULAR
  Filled 2016-03-13: qty 0.5

## 2016-03-13 NOTE — Progress Notes (Signed)
Patient Care Team: Claretta Fraise, MD as PCP - General (Family Medicine) Dr Geanie Berlin (Gynecology) Holley Bouche, NP as Nurse Practitioner (Nurse Practitioner) Nicholas Lose, MD as Consulting Physician (Hematology and Oncology) Fanny Skates, MD as Consulting Physician (General Surgery) Kyung Rudd, MD as Consulting Physician (Radiation Oncology) Sylvan Cheese, NP as Nurse Practitioner (Hematology and Oncology)  DIAGNOSIS: Breast cancer of upper-outer quadrant of left female breast [facility])   Staging form: Breast, AJCC 7th Edition   - Clinical stage from 11/18/2014: Stage IIB (T3, N0, M0) - Unsigned   - Pathologic stage from 04/30/2015: Stage IA (T1a, N0, cM0) - Unsigned  SUMMARY OF ONCOLOGIC HISTORY:   Breast cancer of upper-outer quadrant of left female breast ([place])   11/09/2014 Mammogram    Left breast irregular hypoechoic mass 12:00 position 5.3 x 3.7 x 2.9 cm, left low axilla normal sized lymph nodes      11/09/2014 Initial Diagnosis    Left Bx: IDC with DCIS; Grade 3, ER - (0%), PR- (0%), Her 2 Neg, Ki 67: 70%      11/26/2014 Breast MRI    3.5 x 4.5 x 3.8 cm biopsy-proven malignancy within the upper/ upper inner left breast. Scattered foci adjacent to the biopsy-proven malignancy and within the upper left breast are indeterminate.      11/26/2014 Clinical Stage    Stage IIB: T3 N0      11/30/2014 -  Neo-Adjuvant Chemotherapy    Dose dense Adriamycin and Cytoxan 4 followed by Abraxane weekly 12      04/05/2015 Breast MRI    Breast complete imaging response to neoadjuvant chemotherapy      04/30/2015 Surgery    Left mastectomy: IDC 2 mm residual disease, 1 lymph node positive for isolated tumor cells within normal lymphatics. 0/3 Lymph Nodes      04/30/2015 Pathologic Stage    Stage IA: pT1a pN0      06/28/2015 - 08/12/2015 Radiation Therapy    Adjuvant radiation therapy: Left chest wall and supraclavicular fossa 50.4 Gy over 28 fractions; left chest wall boost 10  Gy over 5 fractions. Total dose 60.4 Gy      12/02/2015 Survivorship    Survivorship care plan provided to patient       CHIEF COMPLIANT: Follow-up of iron deficiency anemia and left breast cancer  INTERVAL HISTORY: Sherry Bender is a 65 year old with above-mentioned history of left breast cancer who received neoadjuvant chemotherapy followed by mastectomy. She underwent adjuvant radiation therapy and is currently on surveillance. She was found to be iron deficient diabetes administered IV iron treatments. She is here today to recheck her blood levels.  REVIEW OF SYSTEMS:   Constitutional: Denies fevers, chills or abnormal weight loss Eyes: Denies blurriness of vision Ears, nose, mouth, throat, and face: Denies mucositis or sore throat Respiratory: Denies cough, dyspnea or wheezes Cardiovascular: Denies palpitation, chest discomfort Gastrointestinal:  Denies nausea, heartburn or change in bowel habits Skin: Denies abnormal skin rashes Lymphatics: Denies new lymphadenopathy or easy bruising Neurological:Denies numbness, tingling or new weaknesses Behavioral/Psych: Mood is stable, no new changes  Extremities: No lower extremity edema Breast:  denies any pain or lumps or nodules in either breasts All other systems were reviewed with the patient and are negative.  I have reviewed the past medical history, past surgical history, social history and family history with the patient and they are unchanged from previous note.  ALLERGIES:  is allergic to codeine.  MEDICATIONS:  Current Outpatient Prescriptions  Medication Sig Dispense  Refill  . DEXILANT 60 MG capsule   4  . estradiol (ESTRACE) 1 MG tablet Take 1 mg by mouth daily.    . hydrochlorothiazide (HYDRODIURIL) 25 MG tablet Take 1 tablet (25 mg total) by mouth daily. 90 tablet 3  . non-metallic deodorant (ALRA) MISC Apply 1 application topically daily as needed. Reported on 10/18/2015    . nystatin (NYSTATIN) powder Apply topically 4  (four) times daily. 15 g 0  . tolterodine (DETROL LA) 2 MG 24 hr capsule Take 1 capsule (2 mg total) by mouth daily. 90 capsule 3   No current facility-administered medications for this visit.     PHYSICAL EXAMINATION: ECOG PERFORMANCE STATUS: 1 - Symptomatic but completely ambulatory  Vitals:   03/13/16 1319  BP: (!) 136/97  Pulse: 83  Resp: 17  Temp: 98.2 F (36.8 C)   Filed Weights   03/13/16 1319  Weight: 96 lb 12.8 oz (43.9 kg)    GENERAL:alert, no distress and comfortable SKIN: skin color, texture, turgor are normal, no rashes or significant lesions EYES: normal, Conjunctiva are pink and non-injected, sclera clear OROPHARYNX:no exudate, no erythema and lips, buccal mucosa, and tongue normal  NECK: supple, thyroid normal size, non-tender, without nodularity LYMPH:  no palpable lymphadenopathy in the cervical, axillary or inguinal LUNGS: clear to auscultation and percussion with normal breathing effort HEART: regular rate & rhythm and no murmurs and no lower extremity edema ABDOMEN:abdomen soft, non-tender and normal bowel sounds MUSCULOSKELETAL:no cyanosis of digits and no clubbing  NEURO: alert & oriented x 3 with fluent speech, no focal motor/sensory deficits EXTREMITIES: No lower extremity edema  LABORATORY DATA:  I have reviewed the data as listed   Chemistry      Component Value Date/Time   NA 136 12/02/2015 0817   K 3.7 12/02/2015 0817   CL 91 (L) 11/16/2015 1414   CO2 27 12/02/2015 0817   BUN 7.7 12/02/2015 0817   CREATININE 0.5 (L) 12/02/2015 0817      Component Value Date/Time   CALCIUM 9.5 12/02/2015 0817   ALKPHOS 57 12/02/2015 0817   AST 27 12/02/2015 0817   ALT 21 12/02/2015 0817   BILITOT <0.30 12/02/2015 0817       Lab Results  Component Value Date   WBC 3.6 (L) 03/13/2016   HGB 12.5 03/13/2016   HCT 36.4 03/13/2016   MCV 95.6 03/13/2016   PLT 162 03/13/2016   NEUTROABS 2.5 03/13/2016     ASSESSMENT & PLAN:  Breast cancer of  upper-outer quadrant of left female breast ([place]) 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 ---------------------------------------------------------------------------------------------------------------------------------------------------------------- Iron deficiency anemia I instructed the patient that she needs upper endoscopy and colonoscopy. IV iron given 12/02/2015 2 doses Response to IV iron therapy: Improvement in hemoglobin from 10.6-12.3  Hypertension: Under her PCP care. Blood pressure today is 136/97. Weight loss: Patient is trying very hard to gain weight but is unable to do so. I gave her instruction on this. Hypokalemia: Potassium 3 probably due to diarrhea.  Encouraged her to eat but doesn't containing foods.  Return to clinic in 3 months with lab count check and follow-up.   No orders of the defined types were placed in this encounter.  The patient has a good understanding of the overall plan. she agrees with it. she will call with any problems that may develop before the next visit here.   Rulon Eisenmenger, MD 03/13/16

## 2016-03-13 NOTE — Assessment & Plan Note (Signed)
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 ---------------------------------------------------------------------------------------------------------------------------------------------------------------- Iron deficiency anemia I instructed the patient that she needs upper endoscopy and colonoscopy. IV iron given 12/02/2015 2 doses Response to IV iron therapy:  Hypertension: Patient tells me that her blood pressures have been elevated because she stopped taking one of her blood pressure medications. I instructed her to call her primary care physician and discuss this further.  Return to clinic in 3 months with lab count check and follow-up.

## 2016-03-17 ENCOUNTER — Telehealth: Payer: Self-pay

## 2016-03-17 NOTE — Telephone Encounter (Signed)
Office Visit notes and MD letter rcvd from [facility].  Provided to Dr. Lindi Adie for review.

## 2016-03-21 ENCOUNTER — Other Ambulatory Visit: Payer: Self-pay

## 2016-03-21 DIAGNOSIS — C50412 Malignant neoplasm of upper-outer quadrant of left female breast: Secondary | ICD-10-CM

## 2016-03-21 MED FILL — DEXILANT DR 60 MG CAPSULE: 60 | 30 days supply | Qty: 30 | Fill #0

## 2016-03-21 NOTE — Progress Notes (Signed)
MRI Brain ordered per note left for me from Jonelle Sports, RN in regards to recommendations from pts ophthalmologist.

## 2016-03-26 ENCOUNTER — Encounter: Payer: Self-pay | Admitting: Hematology and Oncology

## 2016-03-26 NOTE — Progress Notes (Signed)
Nexilant was rejected by insurance due to Non-preferred agents typically have a high patient co-pay than health insurance plan preferred agents. Will give to nurse for alterntive medicaition.

## 2016-03-29 ENCOUNTER — Encounter: Payer: Self-pay | Admitting: Oncology

## 2016-03-29 NOTE — Progress Notes (Unsigned)
Per [facility] Dexilant 60mg  denied due to patient  have to tried omeprazole, lansprazole or pantoprazole first. Gave information to Dr. Geralyn Flash nurse.

## 2016-04-06 ENCOUNTER — Ambulatory Visit (HOSPITAL_COMMUNITY)
Admission: RE | Admit: 2016-04-06 | Discharge: 2016-04-06 | Disposition: A | Discharge status: Home / Self Care | Payer: Managed Care, Other (non HMO) | Source: Ambulatory Visit | Attending: Hematology and Oncology | Admitting: Hematology and Oncology

## 2016-04-06 DIAGNOSIS — C50412 Malignant neoplasm of upper-outer quadrant of left female breast: Secondary | ICD-10-CM | POA: Diagnosis present

## 2016-04-06 DIAGNOSIS — G510 Bell's palsy: Secondary | ICD-10-CM | POA: Diagnosis not present

## 2016-04-06 MED ORDER — GADOBENATE DIMEGLUMINE 529 MG/ML IV SOLN
10.0000 mL | Freq: Once | INTRAVENOUS | Status: AC | PRN
  Administered 2016-04-06: 10 mL via INTRAVENOUS

## 2016-04-08 IMAGING — MR MR BREAST BILAT WO/W CM
8 of 13 series · 31 of 48 positions shown · IV contrast (multihance)
Comparison: Breast MRI 11/26/2014 and bilateral mammogram
11/02/2014

CLINICAL DATA: 64-year-old diagnosed with stage II B left breast
cancer of the upper-outer quadrant in October 2014. Breast MRI performed
in 11/26/2014 showed a 3.5 x 4.5 x 3.8 cm mass with associated
biopsy clip compatible with the recent diagnosis of breast cancer.
There were also scattered foci of enhancement adjacent to the
biopsy-proven malignancy. In total the mass and scattered foci
encompassed an area measuring 7 cm.

The patient has undergone neoadjuvant chemotherapy. This MRI is
performed to evaluate response to therapy.
LABS:  None performed
EXAM:
BILATERAL BREAST MRI WITH AND WITHOUT CONTRAST
TECHNIQUE: Multiplanar, multisequence MR images of both breasts were obtained
prior to and following the intravenous administration of 10 ml of
MultiHance.

[Series 2: T2 · axial · 3.0mm · 0.94mm/px · z∈[-75,+87]mm · 3 of 55 slices shown]
[im 1/55]
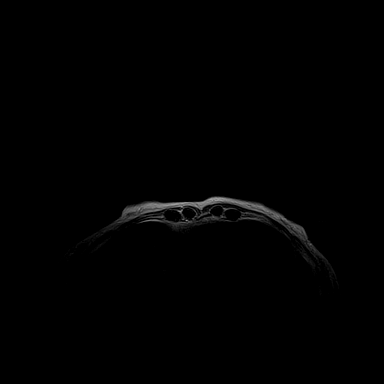
[im 28/55]
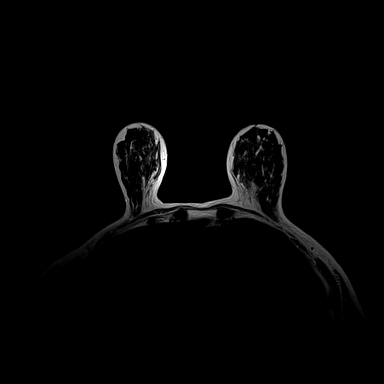
[im 55/55]
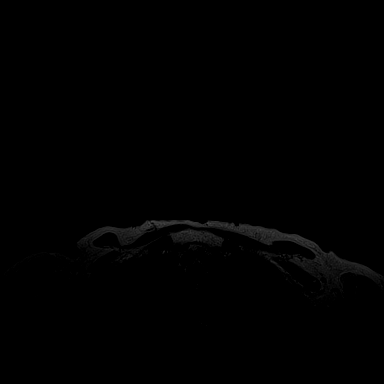

[Series 4: fl3d pre-cm no · axial · non-contrast · 1.2mm · 0.89mm/px · z∈[-79,+92]mm · 5 of 144 slices shown]
[im 1/144]
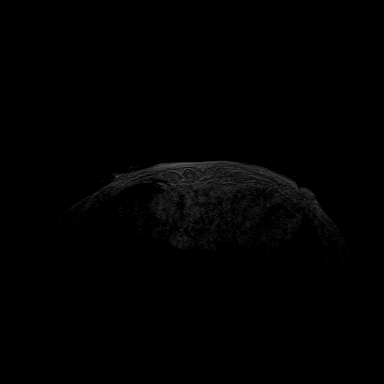
[im 36/144]
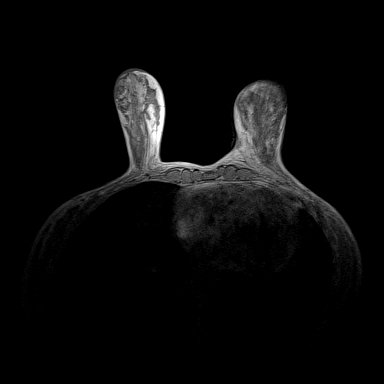
[im 72/144]
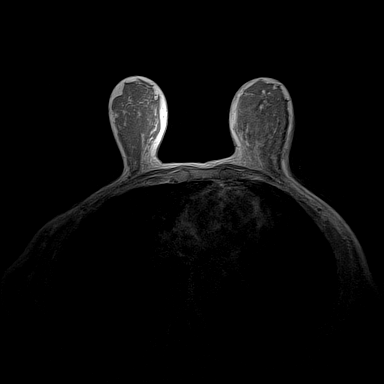
[im 108/144]
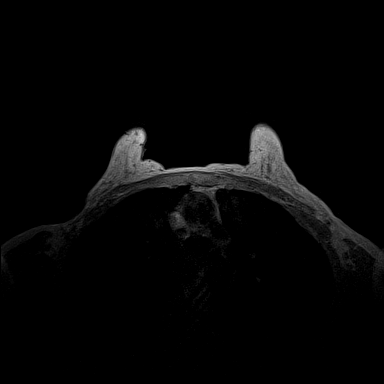
[im 144/144]
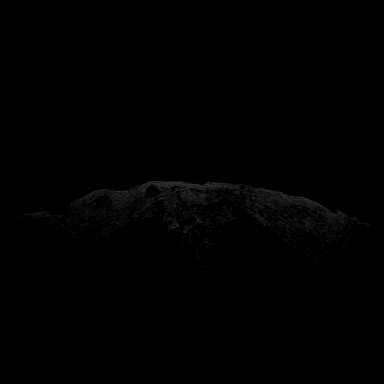

[Series 5: fl3d pre-cm · axial · non-contrast · 1.2mm · 0.89mm/px · z∈[-79,+92]mm · 5 of 144 slices shown]
[im 1/144]
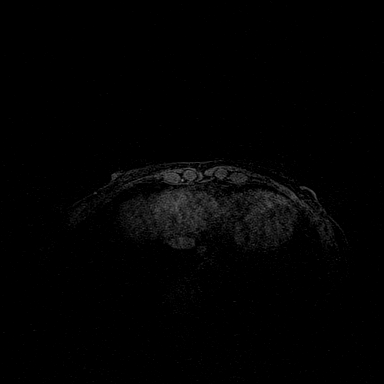
[im 36/144]
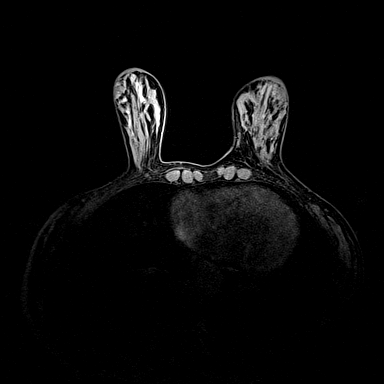
[im 72/144]
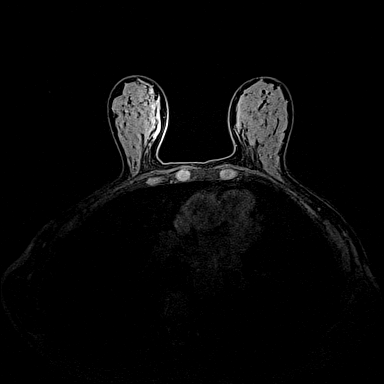
[im 108/144]
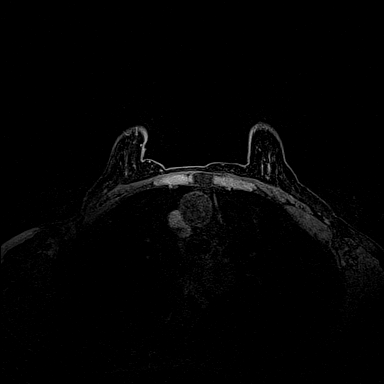
[im 144/144]
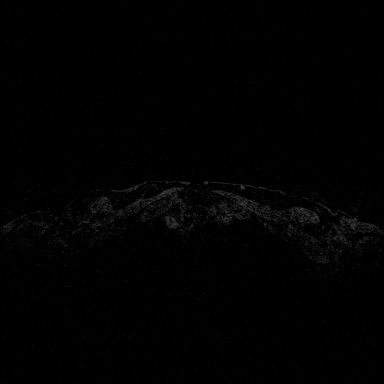

[Series 6: t2_tirm_tra ipat (a-p) · axial · 3.0mm · 0.68mm/px · z∈[-75,+87]mm · 2 of 54 slices shown]
[im 1/54]
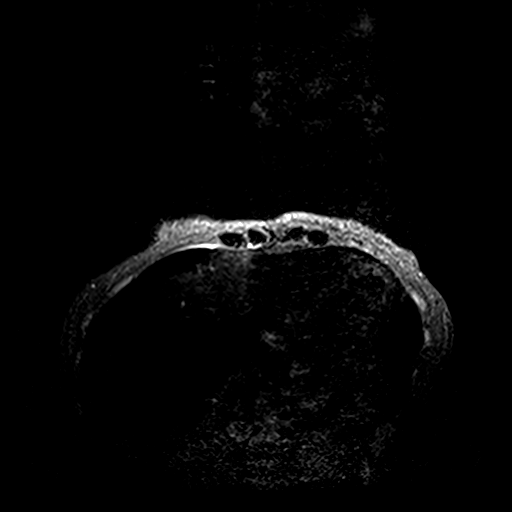
[im 54/54]
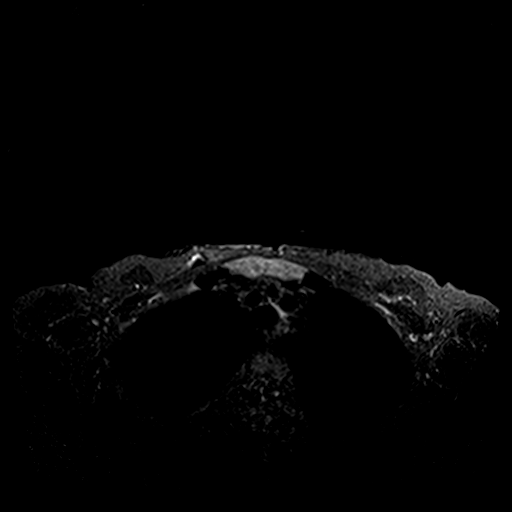

[Series 7: fl3d post-cm 20 · axial · 1.2mm · 0.89mm/px · z∈[-79,+92]mm · 5 of 144 slices shown (1 of 3)]
[im 1/144]
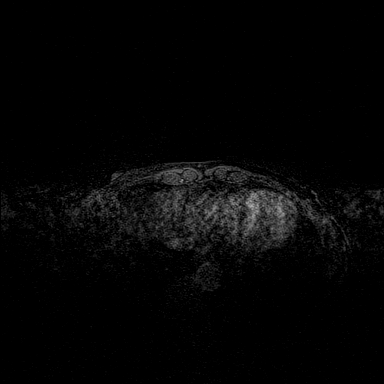
[im 36/144]
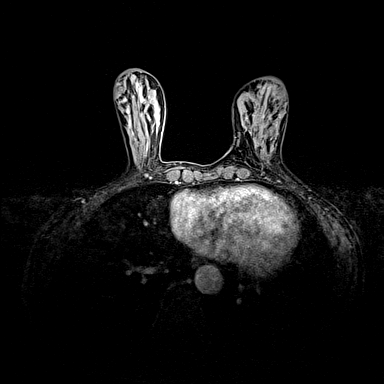
[im 72/144]
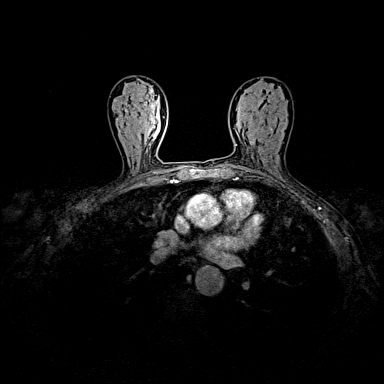
[im 108/144]
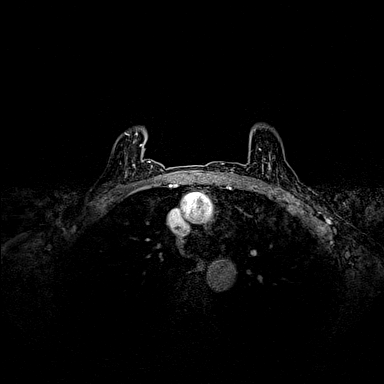
[im 144/144]
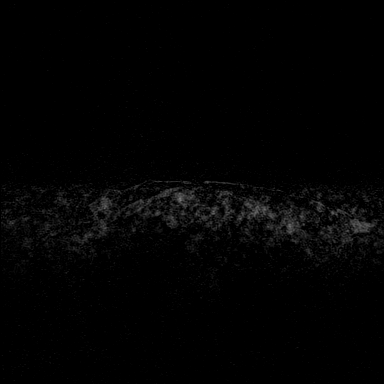

[Series 8: fl3d post-cm 20 · axial · 1.2mm · 0.89mm/px · z∈[-79,+92]mm · 5 of 144 slices shown (2 of 3)]
[im 1/144]
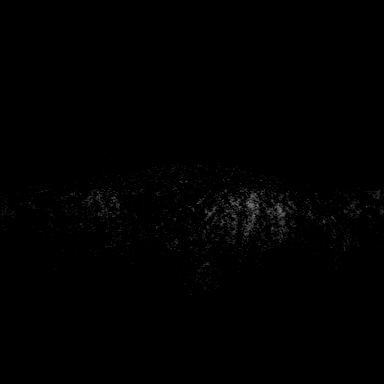
[im 36/144]
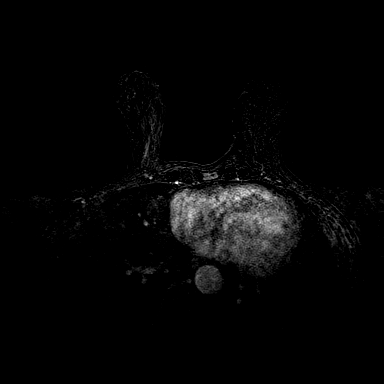
[im 72/144]
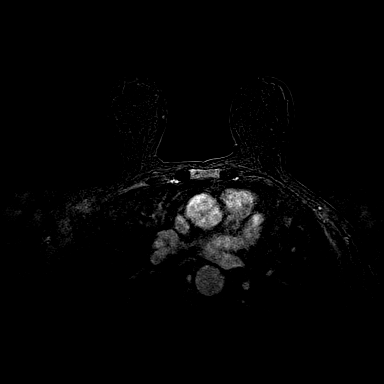
[im 108/144]
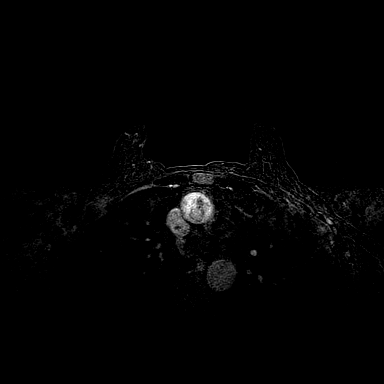
[im 144/144]
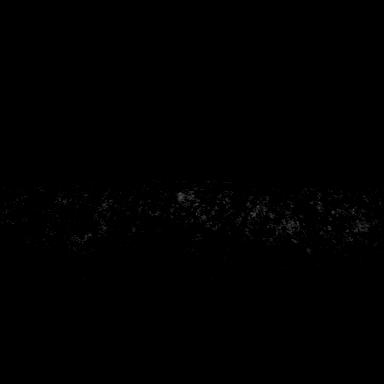

[Series 9: fl3d post-cm 20 · axial · 172.8mm · 0.89mm/px · 1 of 1 slices shown (3 of 3)]
[im 1/1]
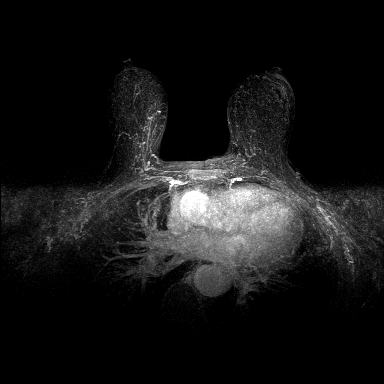

[Series 10: fl3d post-cm 3min · axial · 1.2mm · 0.89mm/px · z∈[-79,+92]mm · 5 of 144 slices shown]
[im 1/144]
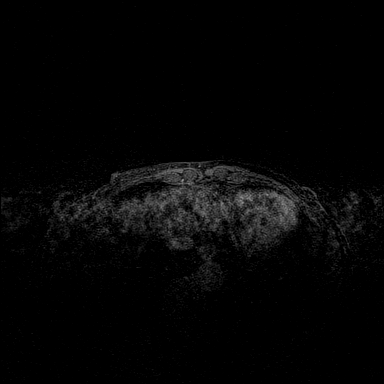
[im 36/144]
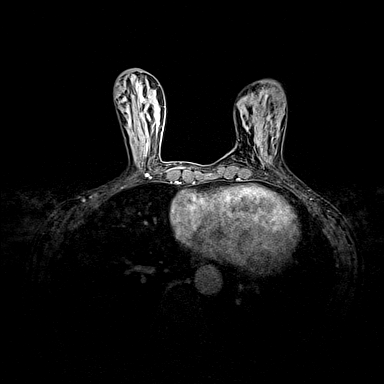
[im 72/144]
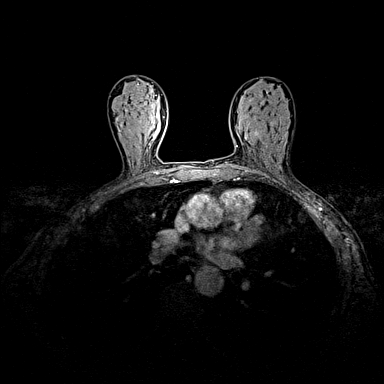
[im 108/144]
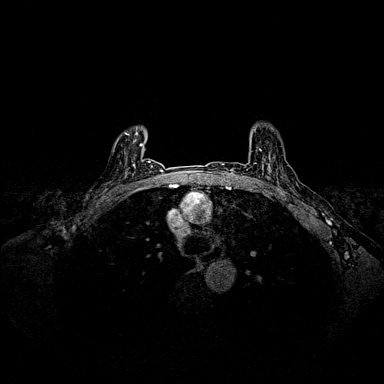
[im 144/144]
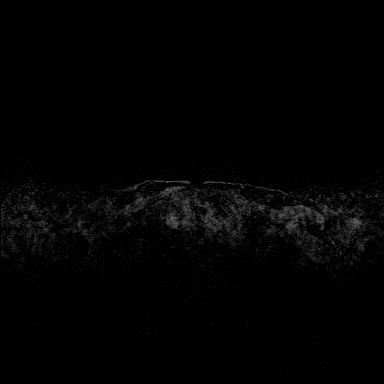

[31 of 48 positions shown; findings below may reference images not displayed]

THREE-DIMENSIONAL MR IMAGE RENDERING ON INDEPENDENT WORKSTATION:

Three-dimensional MR images were rendered by post-processing of the
original MR data on an independent workstation. The
three-dimensional MR images were interpreted, and findings are
reported in the following complete MRI report for this study. Three
dimensional images were evaluated at the independent [employer]
workstation
FINDINGS: Breast composition: d. Extreme fibroglandular tissue.

Background parenchymal enhancement: Mild

Right breast: No mass or abnormal enhancement.

Left breast: Biopsy clip artifact is present in the upper inner left
breast, middle third. There has been a complete imaging response to
neoadjuvant chemotherapy. No mass or asymmetric non-mass enhancement
is identified in the left breast at this time. No mass is visible on
the T1 or T2 weighted images.

Lymph nodes: No abnormal appearing lymph nodes in either axilla or
the internal mammary chains.

Ancillary findings:  None.
IMPRESSION: 1. Complete imaging response of the left breast following
neoadjuvant chemotherapy. No residual mass or abnormal enhancement
is identified in the left breast.
2. No MRI evidence of malignancy in the right breast.
3. Negative for lymphadenopathy.

RECOMMENDATION:
Continue treatment planning.

BI-RADS CATEGORY  6: Known biopsy-proven malignancy.

## 2016-04-24 MED FILL — DEXILANT DR 60 MG CAPSULE: 60 | 30 days supply | Qty: 30 | Fill #1

## 2016-05-29 MED FILL — DEXILANT DR 60 MG CAPSULE: 60 | 30 days supply | Qty: 30 | Fill #2

## 2016-06-07 NOTE — Assessment & Plan Note (Signed)
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 ---------------------------------------------------------------------------------------------------------------------------------------------------------------- Iron deficiency anemia I instructed the patient that she needs upper endoscopy and colonoscopy. IV iron given 12/02/2015 2 doses Response to IV iron therapy: Improvement in hemoglobin from 10.6-12.3  Hypertension: Under her PCP care. Blood pressure today is 136/97. Weight loss: Patient is trying very hard to gain weight but is unable to do so. I gave her instruction on this. Hypokalemia: Potassium 3 probably due to diarrhea. Encouraged her to eat but doesn't containing foods.  Return to clinic in 3 months with lab count check and follow-up.

## 2016-06-08 ENCOUNTER — Telehealth: Payer: Self-pay | Admitting: Hematology and Oncology

## 2016-06-08 ENCOUNTER — Ambulatory Visit (HOSPITAL_BASED_OUTPATIENT_CLINIC_OR_DEPARTMENT_OTHER): Payer: Managed Care, Other (non HMO) | Admitting: Hematology and Oncology

## 2016-06-08 ENCOUNTER — Other Ambulatory Visit (HOSPITAL_BASED_OUTPATIENT_CLINIC_OR_DEPARTMENT_OTHER): Payer: Managed Care, Other (non HMO)

## 2016-06-08 ENCOUNTER — Encounter: Payer: Self-pay | Admitting: Hematology and Oncology

## 2016-06-08 DIAGNOSIS — E876 Hypokalemia: Secondary | ICD-10-CM

## 2016-06-08 DIAGNOSIS — R1310 Dysphagia, unspecified: Secondary | ICD-10-CM

## 2016-06-08 DIAGNOSIS — I10 Essential (primary) hypertension: Secondary | ICD-10-CM

## 2016-06-08 DIAGNOSIS — C50412 Malignant neoplasm of upper-outer quadrant of left female breast: Secondary | ICD-10-CM

## 2016-06-08 DIAGNOSIS — D509 Iron deficiency anemia, unspecified: Secondary | ICD-10-CM

## 2016-06-08 DIAGNOSIS — R634 Abnormal weight loss: Secondary | ICD-10-CM

## 2016-06-08 DIAGNOSIS — R1319 Other dysphagia: Secondary | ICD-10-CM

## 2016-06-08 LAB — IRON AND TIBC
%SAT: 37 % (ref 21–57)
Iron: 93 ug/dL (ref 41–142)
TIBC: 251 ug/dL (ref 236–444)
UIBC: 159 ug/dL (ref 120–384)

## 2016-06-08 LAB — CBC WITH DIFFERENTIAL/PLATELET
BASO%: 0.4 % (ref 0.0–2.0)
Basophils Absolute: 0 10*3/uL (ref 0.0–0.1)
EOS%: 3.2 % (ref 0.0–7.0)
Eosinophils Absolute: 0.2 10*3/uL (ref 0.0–0.5)
HCT: 40 % (ref 34.8–46.6)
HGB: 13.8 g/dL (ref 11.6–15.9)
LYMPH%: 11.1 % — ABNORMAL LOW (ref 14.0–49.7)
MCH: 32.9 pg (ref 25.1–34.0)
MCHC: 34.5 g/dL (ref 31.5–36.0)
MCV: 95.2 fL (ref 79.5–101.0)
MONO#: 0.4 10*3/uL (ref 0.1–0.9)
MONO%: 8.9 % (ref 0.0–14.0)
NEUT#: 3.6 10*3/uL (ref 1.5–6.5)
NEUT%: 76.4 % (ref 38.4–76.8)
Platelets: 134 10*3/uL — ABNORMAL LOW (ref 145–400)
RBC: 4.2 10*6/uL (ref 3.70–5.45)
RDW: 12.6 % (ref 11.2–14.5)
WBC: 4.7 10*3/uL (ref 3.9–10.3)
lymph#: 0.5 10*3/uL — ABNORMAL LOW (ref 0.9–3.3)

## 2016-06-08 LAB — COMPREHENSIVE METABOLIC PANEL
ALT: 22 U/L (ref 0–55)
AST: 29 U/L (ref 5–34)
Albumin: 4 g/dL (ref 3.5–5.0)
Alkaline Phosphatase: 59 U/L (ref 40–150)
Anion Gap: 10 mEq/L (ref 3–11)
BUN: 7.9 mg/dL (ref 7.0–26.0)
CO2: 30 mEq/L — ABNORMAL HIGH (ref 22–29)
Calcium: 9.4 mg/dL (ref 8.4–10.4)
Chloride: 93 mEq/L — ABNORMAL LOW (ref 98–109)
Creatinine: 0.5 mg/dL — ABNORMAL LOW (ref 0.6–1.1)
EGFR: >90 mL/min/{1.73_m2} (ref >90)
Glucose: 90 mg/dl (ref 70–140)
Potassium: 3.2 mEq/L — ABNORMAL LOW (ref 3.5–5.1)
Sodium: 133 mEq/L — ABNORMAL LOW (ref 136–145)
Total Bilirubin: 0.35 mg/dL (ref 0.20–1.20)
Total Protein: 7.3 g/dL (ref 6.4–8.3)

## 2016-06-08 LAB — FERRITIN: Ferritin: 142 ng/ml (ref 9–269)

## 2016-06-08 NOTE — Telephone Encounter (Signed)
Appointments scheduled per 12/21 LOS. Patient given AVS report and calendars with future scheduled appointments.  °

## 2016-06-08 NOTE — Progress Notes (Signed)
Patient Care Team: Claretta Fraise, MD as PCP - General (Family Medicine) Dr Geanie Berlin (Gynecology) Holley Bouche, NP as Nurse Practitioner (Nurse Practitioner) Nicholas Lose, MD as Consulting Physician (Hematology and Oncology) Fanny Skates, MD as Consulting Physician (General Surgery) Kyung Rudd, MD as Consulting Physician (Radiation Oncology) Sylvan Cheese, NP as Nurse Practitioner (Hematology and Oncology)  DIAGNOSIS:  Encounter Diagnoses  Name Primary?  . Malignant neoplasm of upper-outer quadrant of left female breast, unspecified estrogen receptor status (Sherry Bender)   . Esophageal dysphagia     SUMMARY OF ONCOLOGIC HISTORY:   Breast cancer of upper-outer quadrant of left female breast (Sherry Bender)   11/09/2014 Mammogram    Left breast irregular hypoechoic mass 12:00 position 5.3 x 3.7 x 2.9 cm, left low axilla normal sized lymph nodes      11/09/2014 Initial Diagnosis    Left Bx: IDC with DCIS; Grade 3, ER - (0%), PR- (0%), Her 2 Neg, Ki 67: 70%      11/26/2014 Breast MRI    3.5 x 4.5 x 3.8 cm biopsy-proven malignancy within the upper/ upper inner left breast. Scattered foci adjacent to the biopsy-proven malignancy and within the upper left breast are indeterminate.      11/26/2014 Clinical Stage    Stage IIB: T3 N0      11/30/2014 -  Neo-Adjuvant Chemotherapy    Dose dense Adriamycin and Cytoxan 4 followed by Abraxane weekly 12      04/05/2015 Breast MRI    Breast complete imaging response to neoadjuvant chemotherapy      04/30/2015 Surgery    Left mastectomy: IDC 2 mm residual disease, 1 lymph node positive for isolated tumor cells within normal lymphatics. 0/3 Lymph Nodes      04/30/2015 Pathologic Stage    Stage IA: pT1a pN0      06/28/2015 - 08/12/2015 Radiation Therapy    Adjuvant radiation therapy: Left chest wall and supraclavicular fossa 50.4 Gy over 28 fractions; left chest wall boost 10 Gy over 5 fractions. Total dose 60.4 Gy      12/02/2015  Survivorship    Survivorship care plan provided to patient       CHIEF COMPLIANT: Surveillance of breast cancer and follow-up of iron deficiency anemia  INTERVAL HISTORY: Sherry Bender is a 65 year old with above-mentioned history of left breast cancer treated with mastectomy followed by adjuvant radiation. She is here today for routine follow-up. She also has a diagnosis of iron deficiency anemia. She received IV iron treatment. She complains that she continues to have fatigue issues. She also has difficulty with swallowing as for the PS be sticking in the back of the throat. She would like to have a gastroenterology consultation. Previously she had esophageal problems and had to have undergone dilation. She has occasional discomfort in the breast. She denies any fevers chills night sweats.  REVIEW OF SYSTEMS:   Constitutional: Denies fevers, chills, continues to lose weight slowly. Eyes: Denies blurriness of vision Ears, nose, mouth, throat, and face: Denies mucositis or sore throat Respiratory: Denies cough, dyspnea or wheezes Cardiovascular: Denies palpitation, chest discomfort Gastrointestinal:  40 sticking to the back of the throat as she swallows. Skin: Denies abnormal skin rashes Lymphatics: Denies new lymphadenopathy or easy bruising Neurological:Denies numbness, tingling or new weaknesses Behavioral/Psych: Mood is stable, no new changes  Extremities: No lower extremity edema Breast:  denies any pain or lumps or nodules in either breasts All other systems were reviewed with the patient and are negative.  I have reviewed  the past medical history, past surgical history, social history and family history with the patient and they are unchanged from previous note.  ALLERGIES:  is allergic to codeine.  MEDICATIONS:  Current Outpatient Prescriptions  Medication Sig Dispense Refill  . DEXILANT 60 MG capsule Take 1 capsule (60 mg total) by mouth daily. 30 capsule 6  . estradiol  (ESTRACE) 1 MG tablet Take 1 mg by mouth daily.    . hydrochlorothiazide (HYDRODIURIL) 25 MG tablet Take 1 tablet (25 mg total) by mouth daily. 90 tablet 3  . non-metallic deodorant (ALRA) MISC Apply 1 application topically daily as needed. Reported on 10/18/2015    . nystatin (NYSTATIN) powder Apply topically 4 (four) times daily. 15 g 0  . tolterodine (DETROL LA) 2 MG 24 hr capsule Take 1 capsule (2 mg total) by mouth daily. 90 capsule 3   No current facility-administered medications for this visit.     PHYSICAL EXAMINATION: ECOG PERFORMANCE STATUS: 1 - Symptomatic but completely ambulatory  Vitals:   06/08/16 1335  BP: (!) 156/75  Pulse: 82  Resp: 18  Temp: 98.4 F (36.9 C)   Filed Weights   06/08/16 1335  Weight: 94 lb 11.2 oz (43 kg)    GENERAL:alert, no distress and comfortable SKIN: skin color, texture, turgor are normal, no rashes or significant lesions EYES: normal, Conjunctiva are pink and non-injected, sclera clear OROPHARYNX:no exudate, no erythema and lips, buccal mucosa, and tongue normal  Bender: supple, thyroid normal size, non-tender, without nodularity LYMPH:  no palpable lymphadenopathy in the cervical, axillary or inguinal LUNGS: clear to auscultation and percussion with normal breathing effort HEART: regular rate & rhythm and no murmurs and no lower extremity edema ABDOMEN:abdomen soft, non-tender and normal bowel sounds MUSCULOSKELETAL:no cyanosis of digits and no clubbing  NEURO: alert & oriented x 3 with fluent speech, no focal motor/sensory deficits EXTREMITIES: No lower extremity edema  LABORATORY DATA:  I have reviewed the data as listed   Chemistry      Component Value Date/Time   NA 133 (L) 06/08/2016 1302   K 3.2 (L) 06/08/2016 1302   CL 91 (L) 11/16/2015 1414   CO2 30 (H) 06/08/2016 1302   BUN 7.9 06/08/2016 1302   CREATININE 0.5 (L) 06/08/2016 1302      Component Value Date/Time   CALCIUM 9.4 06/08/2016 1302   ALKPHOS 59 06/08/2016 1302     AST 29 06/08/2016 1302   ALT 22 06/08/2016 1302   BILITOT 0.35 06/08/2016 1302       Lab Results  Component Value Date   WBC 4.7 06/08/2016   HGB 13.8 06/08/2016   HCT 40.0 06/08/2016   MCV 95.2 06/08/2016   PLT 134 (L) 06/08/2016   NEUTROABS 3.6 06/08/2016    ASSESSMENT & PLAN:  Breast cancer of upper-outer quadrant of left female breast ([place]) 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 ---------------------------------------------------------------------------------------------------------------------------------------------------------------- Iron deficiency anemia I instructed the patient that she needs upper endoscopy and colonoscopy. IV iron given 12/02/2015 2 doses  Response to IV iron therapy: Improvement in hemoglobin from 10.6-13.8 ------------------------------------------------------------------------------------------------------------------------------------------------------------- Hypertension: Under her PCP care. Weight loss: Patient is trying very hard to gain weight but is unable to do so. This may be because of dysphagia. I instructed her that we need to consult gastroenterology. I sent a referral for Dr.Nandigam.  Hypokalemia and hyponatremia: She does not have any diarrheal illness. I suspect this may be because of excessive water consumption and not much salt intake. I gave her instructions on how to take water and salt.  Return to clinic in 6 months with lab  count check and follow-up.     Orders Placed This Encounter  Procedures  . CBC with Differential    Standing Status:   Future    Standing Expiration Date:   06/08/2017  . Comprehensive metabolic panel    Standing Status:   Future    Standing Expiration Date:   06/08/2017  . Ferritin    Standing Status:   Future    Standing Expiration Date:   06/08/2017  . Iron and TIBC    Standing Status:   Future    Standing Expiration Date:   06/08/2017  . Ambulatory referral to Gastroenterology    Referral Priority:   Routine    Referral Type:   Consultation    Referral Reason:   Specialty Services Required    Referred to Provider:   Mauri Pole, MD    Number of Visits Requested:   1   The patient has a good understanding of the overall plan. she agrees with it. she will call with any problems that may develop before the next visit here.   Rulon Eisenmenger, MD 06/08/16

## 2016-06-20 ENCOUNTER — Other Ambulatory Visit: Payer: Self-pay | Admitting: Nurse Practitioner

## 2016-06-21 ENCOUNTER — Encounter: Payer: Self-pay | Admitting: Gastroenterology

## 2016-06-21 ENCOUNTER — Other Ambulatory Visit: Payer: Self-pay | Admitting: Emergency Medicine

## 2016-07-03 ENCOUNTER — Ambulatory Visit: Payer: Managed Care, Other (non HMO) | Admitting: Gastroenterology

## 2016-07-03 ENCOUNTER — Ambulatory Visit (INDEPENDENT_AMBULATORY_CARE_PROVIDER_SITE_OTHER): Payer: Managed Care, Other (non HMO) | Admitting: Gastroenterology

## 2016-07-03 ENCOUNTER — Encounter: Payer: Self-pay | Admitting: Gastroenterology

## 2016-07-03 VITALS — BP 142/80 | HR 68 | Ht 64.75 in | Wt 97.4 lb

## 2016-07-03 DIAGNOSIS — R634 Abnormal weight loss: Secondary | ICD-10-CM | POA: Diagnosis not present

## 2016-07-03 DIAGNOSIS — R1310 Dysphagia, unspecified: Secondary | ICD-10-CM

## 2016-07-03 DIAGNOSIS — Z8719 Personal history of other diseases of the digestive system: Secondary | ICD-10-CM

## 2016-07-03 MED FILL — DEXILANT DR 60 MG CAPSULE: 60 | 30 days supply | Qty: 30 | Fill #3

## 2016-07-03 NOTE — Progress Notes (Signed)
07/03/2016 Sherry Bender [id number] 07-15-1950   HISTORY OF PRESENT ILLNESS:  This is a pleasant 66 year old female who is new to our office and been referred here by Dr. Sonny Dandy for complaints of dysphagia. She tells me that she has history of needing her esophagus stretched several years ago in [place], [place]. Most recently she has been battling breast cancer and underwent chemotherapy and radiation.  While she was taking her radiation treatments, so over a year ago, she started developing issues swallowing again. She tells me that liquids go down just fine, but she cannot get down products such as steak, hamburgers. Things like toast, cookies, etc. get stuck as well. She denies any pain with swallowing.  she is on Dexilant 60 mg daily, which she has been taking for quite some time, she estimates since 2004.  She has had significant weight loss, 40-50 pounds, throughout all of this as well.  Also had neck fusion with plate and screws in [id number].  Has not undergone colonoscopy in many years.  No lower GI complaints at this time.   Past Medical History:  Diagnosis Date  . Anxiety   . Arthritis    IN BACK....  . Breast cancer ([place]) 2016  . 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 Complication of anesthesia    "HARD TO WAKE UP".......Marland KitchenN/V  . GERD (gastroesophageal reflux disease)   . Hyperlipidemia   . Hypertension   . PONV (postoperative nausea and vomiting)   . Scoliosis   . Urinary frequency    Past Surgical History:  Procedure Laterality Date  . ABDOMINAL HYSTERECTOMY  2000   TAH/BSO  . ANTERIOR CERVICAL DECOMP/DISCECTOMY FUSION N/A 06/18/2014   Procedure: C4-5 C5-6 C6-7 ANTERIOR CERVICAL DECOMPRESSION/DISKECTOMY/FUSION;  Surgeon: Eustace Moore, MD;  Location: [facility];  Service: Neurosurgery;  Laterality: N/A;  C4-5 C5-6 C6-7 ANTERIOR CERVICAL DECOMPRESSION/DISKECTOMY/FUSION  . ANTERIOR CERVICAL DECOMP/DISCECTOMY FUSION N/A 06/25/2014   Procedure: EXPLORATION OF CERVICAL  FUSION WITH REVIOSN OF HARDWARE;  Surgeon: Eustace Moore, MD;  Location: [facility];  Service: Neurosurgery;  Laterality: N/A;  . CERVICAL FUSION    . CHOLECYSTECTOMY  1994  . COMPLETE MASTECTOMY W/ SENTINEL NODE BIOPSY Left 04/30/2015  . FOOT NEUROMA SURGERY  01/2010   also had metatarasl shortened  . KNEE ARTHROSCOPY  2005   right knee  . PORT-A-CATH REMOVAL Right 04/30/2015   Procedure: REMOVAL PORT-A-CATH;  Surgeon: Fanny Skates, MD;  Location: [facility];  Service: General;  Laterality: Right;  . PORTACATH PLACEMENT N/A 11/23/2014   Procedure: INSERTION PORT-A-CATH WITH ULTRASOUND;  Surgeon: Fanny Skates, MD;  Location: [facility];  Service: General;  Laterality: N/A;  . RHINOPLASTY    . SIMPLE MASTECTOMY WITH AXILLARY SENTINEL NODE BIOPSY Left 04/30/2015   Procedure: LEFT TOTAL  MASTECTOMY WITH LEFT AXILLARY SENTINEL NODE BIOPSY;  Surgeon: Fanny Skates, MD;  Location: [facility];  Service: General;  Laterality: Left;    reports that she has been smoking Cigarettes.  She has a 10.50 pack-year smoking history. She has never used smokeless tobacco. She reports that she does not drink alcohol or use drugs. family history includes Cancer in her maternal grandmother and paternal aunt; Diabetes in her mother; Heart disease in her father and mother; Hypertension in her father and mother. Allergies  Allergen Reactions  . Codeine Nausea And Vomiting      Outpatient Encounter Prescriptions as of 07/03/2016  Medication Sig  . DEXILANT 60 MG capsule Take  1 capsule (60 mg total) by mouth daily.  . hydrochlorothiazide (HYDRODIURIL) 25 MG tablet Take 1 tablet (25 mg total) by mouth daily.  Marland Kitchen tolterodine (DETROL LA) 2 MG 24 hr capsule Take 1 capsule (2 mg total) by mouth daily.  Marland Kitchen estradiol (ESTRACE) 1 MG tablet Take 1 mg by mouth daily.  . [DISCONTINUED] non-metallic deodorant Jethro Poling) MISC Apply 1 application topically daily as needed. Reported on 10/18/2015  . [DISCONTINUED] nystatin  (NYSTATIN) powder Apply topically 4 (four) times daily. (Patient not taking: Reported on 07/03/2016)   No facility-administered encounter medications on file as of 07/03/2016.      REVIEW OF SYSTEMS  : All other systems reviewed and negative except where noted in the History of Present Illness.   PHYSICAL EXAM: BP (!) 142/80 (BP Location: Right Leg, Patient Position: Sitting, Cuff Size: Small)   Pulse 68   Ht 5' 4.75" (1.645 m)   Wt 97 lb 6.4 oz (44.2 kg)   LMP 02/28/1999   BMI 16.33 kg/m  General:  Very thin white female in no acute distress Head: Normocephalic and atraumatic Eyes:  Sclerae anicteric, conjunctiva pink. Ears: Normal auditory acuity Lungs: Clear throughout to auscultation Heart: Regular rate and rhythm Abdomen: Soft, non-distended.  Normal bowel sounds.  Non-tender. Musculoskeletal: Symmetrical with no gross deformities  Skin: No lesions on visible extremities Extremities: No edema  Neurological: Alert oriented x 4, grossly non-focal Psychological:  Alert and cooperative. Normal mood and affect  ASSESSMENT AND PLAN: -Dysphagia:  Reports history of esophageal stricture requiring dilation many years ago. More recently she has had chemo and radiation for breast cancer and her symptoms seem to be higher up in her esophagus. We will schedule her for EGD with possible dilation with Dr. Silverio Decamp tentatively, but I am going to order a barium esophagram to be performed prior to EGD, rule out radiation induced stricture vs peptic stricture, etc.  ? If this is related to hardware from her cervical fusion.  Continue Dexilant 60 mg daily. -Breast cancer s/p radiation and chemo 2016-2017  *Just of note, I discussed possible colonoscopy with the patient int he near future as well.  She declined for now.  CC:  Nicholas Lose, MD

## 2016-07-03 NOTE — Patient Instructions (Addendum)
If you are age 66 or older, your body mass index should be between 23-30. Your Body mass index is 16.33 kg/m. If this is out of the aforementioned range listed, please consider follow up with your Primary Care Provider.  If you are age 66 or younger, your body mass index should be between 19-25. Your Body mass index is 16.33 kg/m. If this is out of the aformentioned range listed, please consider follow up with your Primary Care Provider.   You have been scheduled for an endoscopy. Please follow written instructions given to you at your visit today. If you use inhalers (even only as needed), please bring them with you on the day of your procedure. Your physician has requested that you go to www.startemmi.com and enter the access code given to you at your visit today. This web site gives a general overview about your procedure. However, you should still follow specific instructions given to you by our office regarding your preparation for the procedure.  You have been scheduled for a Barium Esophogram at [facility] ([place]) on Monday, January 22nd at 10:30am . Please arrive 15 minutes prior to your appointment for registration. Make certain not to have anything to eat or drink 3 hours prior to your test. If you need to reschedule for any reason, please contact radiology at [phone] to do so. __________________________________________________________________ A barium swallow is an examination that concentrates on views of the esophagus. This tends to be a double contrast exam (barium and two liquids which, when combined, create a gas to distend the wall of the oesophagus) or single contrast (non-ionic iodine based). The study is usually tailored to your symptoms so a good history is essential. Attention is paid during the study to the form, structure and configuration of the esophagus, looking for functional disorders (such as aspiration, dysphagia, achalasia, motility and  reflux) EXAMINATION You may be asked to change into a gown, depending on the type of swallow being performed. A radiologist and radiographer will perform the procedure. The radiologist will advise you of the type of contrast selected for your procedure and direct you during the exam. You will be asked to stand, sit or lie in several different positions and to hold a small amount of fluid in your mouth before being asked to swallow while the imaging is performed .In some instances you may be asked to swallow barium coated marshmallows to assess the motility of a solid food bolus. The exam can be recorded as a digital or video fluoroscopy procedure. POST PROCEDURE It will take 1-2 days for the barium to pass through your system. To facilitate this, it is important, unless otherwise directed, to increase your fluids for the next 24-48hrs and to resume your normal diet.  This test typically takes about 30 minutes to perform. __________________________________________________________________________________

## 2016-07-05 ENCOUNTER — Ambulatory Visit (HOSPITAL_COMMUNITY): Payer: Managed Care, Other (non HMO)

## 2016-07-06 ENCOUNTER — Encounter: Payer: Self-pay | Admitting: Gastroenterology

## 2016-07-10 ENCOUNTER — Ambulatory Visit (HOSPITAL_COMMUNITY)
Admission: RE | Admit: 2016-07-10 | Discharge: 2016-07-10 | Disposition: A | Discharge status: Home / Self Care | Payer: Managed Care, Other (non HMO) | Source: Ambulatory Visit | Attending: Gastroenterology | Admitting: Gastroenterology

## 2016-07-10 DIAGNOSIS — R1310 Dysphagia, unspecified: Secondary | ICD-10-CM | POA: Diagnosis not present

## 2016-07-10 DIAGNOSIS — Z8719 Personal history of other diseases of the digestive system: Secondary | ICD-10-CM

## 2016-07-10 NOTE — Progress Notes (Signed)
Reviewed and agree with documentation and assessment and plan. K. Veena Nandigam , MD   

## 2016-07-14 ENCOUNTER — Encounter: Payer: Self-pay | Admitting: Gastroenterology

## 2016-07-14 ENCOUNTER — Ambulatory Visit (AMBULATORY_SURGERY_CENTER): Payer: Managed Care, Other (non HMO) | Admitting: Gastroenterology

## 2016-07-14 VITALS — BP 185/86 | HR 76 | Temp 97.8°F | Resp 16 | Ht 64.0 in | Wt 97.0 lb

## 2016-07-14 DIAGNOSIS — R1310 Dysphagia, unspecified: Secondary | ICD-10-CM

## 2016-07-14 DIAGNOSIS — K253 Acute gastric ulcer without hemorrhage or perforation: Secondary | ICD-10-CM

## 2016-07-14 MED ORDER — SODIUM CHLORIDE 0.9 % IV SOLN: 500.0000 mL | INTRAVENOUS | Status: DC

## 2016-07-14 MED ORDER — FLUCONAZOLE 100 MG PO TABS: 100.0000 mg | ORAL_TABLET | Freq: Every day | ORAL | 0 refills | Status: DC

## 2016-07-14 MED FILL — FLUCONAZOLE 100 MG TABLET: 100 | 10 days supply | Qty: 10 | Fill #0

## 2016-07-14 NOTE — Patient Instructions (Addendum)
Impression/Recommendations:  Esophagitis handout given to patient. Hiatal hernia handout given to patient. Ulcer handout given to patient.  No Aspirin, Goody Powders, Ibuprofen, Advil, or NSAIDS.   Tylenol only.  Repeat upper endoscopy based on pathology results.  Diflucan (fluconazole) 100 mg. Daily by mouth for 10 days.  YOU HAD AN ENDOSCOPIC PROCEDURE TODAY AT [facility]:   Refer to the procedure report that was given to you for any specific questions about what was found during the examination.  If the procedure report does not answer your questions, please call your gastroenterologist to clarify.  If you requested that your care partner not be given the details of your procedure findings, then the procedure report has been included in a sealed envelope for you to review at your convenience later.  YOU SHOULD EXPECT: Some feelings of bloating in the abdomen. Passage of more gas than usual.  Walking can help get rid of the air that was put into your GI tract during the procedure and reduce the bloating. If you had a lower endoscopy (such as a colonoscopy or flexible sigmoidoscopy) you may notice spotting of blood in your stool or on the toilet paper. If you underwent a bowel prep for your procedure, you may not have a normal bowel movement for a few days.  Please Note:  You might notice some irritation and congestion in your nose or some drainage.  This is from the oxygen used during your procedure.  There is no need for concern and it should clear up in a day or so.  SYMPTOMS TO REPORT IMMEDIATELY:   Following upper endoscopy (EGD)  Vomiting of blood or coffee ground material  New chest pain or pain under the shoulder blades  Painful or persistently difficult swallowing  New shortness of breath  Fever of 100F or higher  Black, tarry-looking stools  For urgent or emergent issues, a gastroenterologist can be reached at any hour by calling [phone].   DIET:   We do recommend a small meal at first, but then you may proceed to your regular diet.  Drink plenty of fluids but you should avoid alcoholic beverages for 24 hours.  ACTIVITY:  You should plan to take it easy for the rest of today and you should NOT DRIVE or use heavy machinery until tomorrow (because of the sedation medicines used during the test).    FOLLOW UP: Our staff will call the number listed on your records the next business day following your procedure to check on you and address any questions or concerns that you may have regarding the information given to you following your procedure. If we do not reach you, we will leave a message.  However, if you are feeling well and you are not experiencing any problems, there is no need to return our call.  We will assume that you have returned to your regular daily activities without incident.  If any biopsies were taken you will be contacted by phone or by letter within the next 1-3 weeks.  Please call us at [phone] if you have not heard about the biopsies in 3 weeks.    SIGNATURES/CONFIDENTIALITY: You and/or your care partner have signed paperwork which will be entered into your electronic medical record.  These signatures attest to the fact that that the information above on your After Visit Summary has been reviewed and is understood.  Full responsibility of the confidentiality of this discharge information lies with you and/or your care-partner.

## 2016-07-14 NOTE — Progress Notes (Signed)
Called to room to assist during endoscopic procedure.  Patient ID and intended procedure confirmed with present staff. Received instructions for my participation in the procedure from the performing physician.  

## 2016-07-14 NOTE — Op Note (Signed)
[facility] Patient Name: Sherry Bender Procedure Date: 07/14/2016 1:54 PM MRN: [id number] Endoscopist: Mauri Pole , MD Age: 66 Referring MD:  Date of Birth: May 18, 1951 Gender: Female Account #: [id number] Procedure:                Upper GI endoscopy Indications:              Dysphagia Medicines:                Monitored Anesthesia Care Procedure:                Pre-Anesthesia Assessment:                           - Prior to the procedure, a History and Physical                            was performed, and patient medications and                            allergies were reviewed. The patient's tolerance of                            previous anesthesia was also reviewed. The risks                            and benefits of the procedure and the sedation                            options and risks were discussed with the patient.                            All questions were answered, and informed consent                            was obtained. Prior Anticoagulants: The patient has                            taken no previous anticoagulant or antiplatelet                            agents. ASA Grade Assessment: III - A patient with                            severe systemic disease. After reviewing the risks                            and benefits, the patient was deemed in                            satisfactory condition to undergo the procedure.                           After obtaining informed consent, the endoscope was  passed under direct vision. Throughout the                            procedure, the patient's blood pressure, pulse, and                            oxygen saturations were monitored continuously. The                            Model [id number]-[id number]) scope was introduced                            through the mouth, and advanced to the second part                            of duodenum. The upper GI endoscopy  was                            accomplished without difficulty. The patient                            tolerated the procedure well. Scope In: Scope Out: Findings:                 Diffuse white plaques sugestive of possible                            candidiasis was found in the lower third of the                            esophagus. Biopsies were taken with a cold forceps                            for histology.                           A small hiatal hernia was present.                           One non-bleeding cratered gastric ulcer with no                            stigmata of bleeding was found in the gastric                            antrum. The lesion was 7 mm in largest dimension.                            Biopsies were taken with a cold forceps for                            histology.                           The examined duodenum was normal. Complications:  No immediate complications. Estimated Blood Loss:     Estimated blood loss was minimal. Impression:               - Monilial esophagitis. Biopsied.                           - Small hiatal hernia.                           - Non-bleeding gastric ulcer with no stigmata of                            bleeding. Biopsied.                           - Normal examined duodenum. Recommendation:           - Resume previous diet.                           - Continue present medications.                           - Await pathology results.                           - Repeat upper endoscopy after studies are complete                            for surveillance based on pathology results.                           - Diflucan (fluconazole) 100 mg PO daily for 10                            days. Mauri Pole, MD 07/14/2016 2:26:14 PM This report has been signed electronically.

## 2016-07-14 NOTE — Progress Notes (Signed)
Dental advisory given to patient 

## 2016-07-17 ENCOUNTER — Telehealth: Payer: Self-pay

## 2016-07-17 NOTE — Telephone Encounter (Signed)
  Follow up Call-  Call back number 07/14/2016  Post procedure Call Back phone  # [phone]  Permission to leave phone message Yes  Some recent data might be hidden     Patient questions:  Do you have a fever, pain , or abdominal swelling? No. Pain Score  0 *  Have you tolerated food without any problems? Yes.    Have you been able to return to your normal activities? Yes.    Do you have any questions about your discharge instructions: Diet   No. Medications  No. Follow up visit  No.  Do you have questions or concerns about your Care? No.  Actions: * If pain score is 4 or above: No action needed, pain <4.

## 2016-07-17 NOTE — Telephone Encounter (Signed)
  Follow up Call-  Call back number 07/14/2016  Post procedure Call Back phone  # [phone]  Permission to leave phone message Yes  Some recent data might be hidden    Patient was called for follow up after her procedure on 07/14/2016. No answer at the number given for follow up phone call. A message was left on the answering machine.

## 2016-07-24 ENCOUNTER — Encounter: Payer: Self-pay | Admitting: Gastroenterology

## 2016-07-31 ENCOUNTER — Ambulatory Visit: Payer: Managed Care, Other (non HMO) | Admitting: Gastroenterology

## 2016-08-03 MED FILL — DEXILANT DR 60 MG CAPSULE: 60 | 30 days supply | Qty: 30 | Fill #4

## 2016-09-05 ENCOUNTER — Ambulatory Visit: Payer: Managed Care, Other (non HMO) | Admitting: Hematology and Oncology

## 2016-09-05 ENCOUNTER — Other Ambulatory Visit: Payer: Managed Care, Other (non HMO)

## 2016-09-05 MED FILL — DEXILANT DR 60 MG CAPSULE: 60 | 30 days supply | Qty: 30 | Fill #5

## 2016-10-03 MED FILL — DEXILANT DR 60 MG CAPSULE: 60 | 30 days supply | Qty: 30 | Fill #6

## 2016-10-16 ENCOUNTER — Other Ambulatory Visit: Payer: Self-pay | Admitting: Emergency Medicine

## 2016-10-17 ENCOUNTER — Ambulatory Visit (HOSPITAL_BASED_OUTPATIENT_CLINIC_OR_DEPARTMENT_OTHER): Payer: 59 | Admitting: Hematology and Oncology

## 2016-10-17 ENCOUNTER — Encounter: Payer: Self-pay | Admitting: Hematology and Oncology

## 2016-10-17 ENCOUNTER — Other Ambulatory Visit (HOSPITAL_BASED_OUTPATIENT_CLINIC_OR_DEPARTMENT_OTHER): Payer: 59

## 2016-10-17 DIAGNOSIS — C50412 Malignant neoplasm of upper-outer quadrant of left female breast: Secondary | ICD-10-CM

## 2016-10-17 DIAGNOSIS — D509 Iron deficiency anemia, unspecified: Secondary | ICD-10-CM

## 2016-10-17 DIAGNOSIS — Z171 Estrogen receptor negative status [ER-]: Secondary | ICD-10-CM

## 2016-10-17 LAB — IRON AND TIBC
%SAT: 41 % (ref 21–57)
Iron: 98 ug/dL (ref 41–142)
TIBC: 240 ug/dL (ref 236–444)
UIBC: 143 ug/dL (ref 120–384)

## 2016-10-17 LAB — CBC WITH DIFFERENTIAL/PLATELET
BASO%: 0.9 % (ref 0.0–2.0)
Basophils Absolute: 0 10*3/uL (ref 0.0–0.1)
EOS%: 4.4 % (ref 0.0–7.0)
Eosinophils Absolute: 0.2 10*3/uL (ref 0.0–0.5)
HCT: 39.4 % (ref 34.8–46.6)
HGB: 13.4 g/dL (ref 11.6–15.9)
LYMPH%: 16 % (ref 14.0–49.7)
MCH: 32.8 pg (ref 25.1–34.0)
MCHC: 33.9 g/dL (ref 31.5–36.0)
MCV: 96.8 fL (ref 79.5–101.0)
MONO#: 0.3 10*3/uL (ref 0.1–0.9)
MONO%: 8.7 % (ref 0.0–14.0)
NEUT#: 2.7 10*3/uL (ref 1.5–6.5)
NEUT%: 70 % (ref 38.4–76.8)
Platelets: 159 10*3/uL (ref 145–400)
RBC: 4.07 10*6/uL (ref 3.70–5.45)
RDW: 13 % (ref 11.2–14.5)
WBC: 3.8 10*3/uL — ABNORMAL LOW (ref 3.9–10.3)
lymph#: 0.6 10*3/uL — ABNORMAL LOW (ref 0.9–3.3)

## 2016-10-17 LAB — COMPREHENSIVE METABOLIC PANEL
ALT: 22 U/L (ref 0–55)
AST: 27 U/L (ref 5–34)
Albumin: 4 g/dL (ref 3.5–5.0)
Alkaline Phosphatase: 50 U/L (ref 40–150)
Anion Gap: 10 mEq/L (ref 3–11)
BUN: 11.3 mg/dL (ref 7.0–26.0)
CO2: 30 mEq/L — ABNORMAL HIGH (ref 22–29)
Calcium: 9.4 mg/dL (ref 8.4–10.4)
Chloride: 97 mEq/L — ABNORMAL LOW (ref 98–109)
Creatinine: 0.5 mg/dL — ABNORMAL LOW (ref 0.6–1.1)
EGFR: >90 mL/min/{1.73_m2} (ref >90)
Glucose: 100 mg/dl (ref 70–140)
Potassium: 3.3 mEq/L — ABNORMAL LOW (ref 3.5–5.1)
Sodium: 136 mEq/L (ref 136–145)
Total Bilirubin: 0.38 mg/dL (ref 0.20–1.20)
Total Protein: 6.9 g/dL (ref 6.4–8.3)

## 2016-10-17 LAB — FERRITIN: Ferritin: 108 ng/ml (ref 9–269)

## 2016-10-17 MED ORDER — DEXILANT 60 MG PO CPDR: 60.0000 mg | DELAYED_RELEASE_CAPSULE | Freq: Every day | ORAL | 11 refills | Status: DC

## 2016-10-17 NOTE — Progress Notes (Signed)
Patient Care Team: Claretta Fraise, MD as PCP - General (Family Medicine) Dr Geanie Berlin (Gynecology) Holley Bouche, NP as Nurse Practitioner (Nurse Practitioner) Nicholas Lose, MD as Consulting Physician (Hematology and Oncology) Fanny Skates, MD as Consulting Physician (General Surgery) Kyung Rudd, MD as Consulting Physician (Radiation Oncology) Sylvan Cheese, NP as Nurse Practitioner (Hematology and Oncology)  DIAGNOSIS:  Encounter Diagnosis  Name Primary?  . Malignant neoplasm of upper-outer quadrant of left breast in female, estrogen receptor negative ([place])     SUMMARY OF ONCOLOGIC HISTORY:   Breast cancer of upper-outer quadrant of left female breast ([place])   11/09/2014 Mammogram    Left breast irregular hypoechoic mass 12:00 position 5.3 x 3.7 x 2.9 cm, left low axilla normal sized lymph nodes      11/09/2014 Initial Diagnosis    Left Bx: IDC with DCIS; Grade 3, ER - (0%), PR- (0%), Her 2 Neg, Ki 67: 70%      11/26/2014 Breast MRI    3.5 x 4.5 x 3.8 cm biopsy-proven malignancy within the upper/ upper inner left breast. Scattered foci adjacent to the biopsy-proven malignancy and within the upper left breast are indeterminate.      11/26/2014 Clinical Stage    Stage IIB: T3 N0      11/30/2014 -  Neo-Adjuvant Chemotherapy    Dose dense Adriamycin and Cytoxan 4 followed by Abraxane weekly 12      04/05/2015 Breast MRI    Breast complete imaging response to neoadjuvant chemotherapy      04/30/2015 Surgery    Left mastectomy: IDC 2 mm residual disease, 1 lymph node positive for isolated tumor cells within normal lymphatics. 0/3 Lymph Nodes      04/30/2015 Pathologic Stage    Stage IA: pT1a pN0      06/28/2015 - 08/12/2015 Radiation Therapy    Adjuvant radiation therapy: Left chest wall and supraclavicular fossa 50.4 Gy over 28 fractions; left chest wall boost 10 Gy over 5 fractions. Total dose 60.4 Gy      12/02/2015 Survivorship    Survivorship care plan  provided to patient       CHIEF COMPLIANT: Follow-up of left breast cancer triple negative disease  INTERVAL HISTORY: Sherry Bender is a 66 year old with above-mentioned history of left breast cancer treated with neoadjuvant chemotherapy followed by mastectomy and radiation. She is currently on surveillance. She had previously reported to me problems with dysphagia. She had upper endoscopy done. She was apparently diagnosed with thrush and received treatment for it. Since then her swelling has improved and she is gaining weight slowly. She came in today complaining of severe pain in the chest wall especially on the left side. Over the past several weeks is been bothering her. It is also painful today. There is no lumps or nodules in that area.  REVIEW OF SYSTEMS:   Constitutional: Denies fevers, chills or abnormal weight loss Eyes: Denies blurriness of vision Ears, nose, mouth, throat, and face: Denies mucositis or sore throat Respiratory: Denies cough, dyspnea or wheezes, left chest wall pain Cardiovascular: Denies palpitation, chest discomfort Gastrointestinal:  Denies nausea, heartburn or change in bowel habits Skin: Denies abnormal skin rashes Lymphatics: Denies new lymphadenopathy or easy bruising Neurological:Denies numbness, tingling or new weaknesses Behavioral/Psych: Mood is stable, no new changes  Extremities: No lower extremity edema Breast:  denies any pain or lumps or nodules in right breast All other systems were reviewed with the patient and are negative.  I have reviewed the past medical history,  past surgical history, social history and family history with the patient and they are unchanged from previous note.  ALLERGIES:  is allergic to codeine.  MEDICATIONS:  Current Outpatient Prescriptions  Medication Sig Dispense Refill  . DEXILANT 60 MG capsule Take 1 capsule (60 mg total) by mouth daily. 30 capsule 6  . estradiol (ESTRACE) 1 MG tablet Take 1 mg by mouth daily.     . fluconazole (DIFLUCAN) 100 MG tablet Take 1 tablet (100 mg total) by mouth daily. For 10 days. 100 tablet 0  . hydrochlorothiazide (HYDRODIURIL) 25 MG tablet Take 1 tablet (25 mg total) by mouth daily. 90 tablet 3  . tolterodine (DETROL LA) 2 MG 24 hr capsule Take 1 capsule (2 mg total) by mouth daily. 90 capsule 3   Current Facility-Administered Medications  Medication Dose Route Frequency Provider Last Rate Last Dose  . 0.9 %  sodium chloride infusion  500 mL Intravenous Continuous Kavitha Nandigam V, MD        PHYSICAL EXAMINATION: ECOG PERFORMANCE STATUS: 1 - Symptomatic but completely ambulatory  There were no vitals filed for this visit. There were no vitals filed for this visit.  GENERAL:alert, no distress and comfortable SKIN: skin color, texture, turgor are normal, no rashes or significant lesions EYES: normal, Conjunctiva are pink and non-injected, sclera clear OROPHARYNX:no exudate, no erythema and lips, buccal mucosa, and tongue normal  NECK: supple, thyroid normal size, non-tender, without nodularity LYMPH:  no palpable lymphadenopathy in the cervical, axillary or inguinal LUNGS: clear to auscultation and percussion with normal breathing effort HEART: regular rate & rhythm and no murmurs and no lower extremity edema ABDOMEN:abdomen soft, non-tender and normal bowel sounds MUSCULOSKELETAL:no cyanosis of digits and no clubbing  NEURO: alert & oriented x 3 with fluent speech, no focal motor/sensory deficits EXTREMITIES: No lower extremity edema BREAST: No palpable masses or nodules in either right  breast. No palpable axillary supraclavicular or infraclavicular adenopathy no breast tenderness or nipple discharge. (exam performed in the presence of a chaperone)  LABORATORY DATA:  I have reviewed the data as listed   Chemistry      Component Value Date/Time   NA 133 (L) 06/08/2016 1302   K 3.2 (L) 06/08/2016 1302   CL 91 (L) 11/16/2015 1414   CO2 30 (H) 06/08/2016  1302   BUN 7.9 06/08/2016 1302   CREATININE 0.5 (L) 06/08/2016 1302      Component Value Date/Time   CALCIUM 9.4 06/08/2016 1302   ALKPHOS 59 06/08/2016 1302   AST 29 06/08/2016 1302   ALT 22 06/08/2016 1302   BILITOT 0.35 06/08/2016 1302       Lab Results  Component Value Date   WBC 4.7 06/08/2016   HGB 13.8 06/08/2016   HCT 40.0 06/08/2016   MCV 95.2 06/08/2016   PLT 134 (L) 06/08/2016   NEUTROABS 3.6 06/08/2016    ASSESSMENT & PLAN:  Breast cancer of upper-outer quadrant of left female breast ([place]) 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 IV iron given 12/02/2015 2 doses Response to IV iron therapy:Improvement in hemoglobin from 10.6-13.8 -------------------------------------------------------------------------------------------------------------------------------------------------------- Hypertension:Under her PCP care. Weight loss: saw Dr.Nandigam. Upper endoscopy was performed Jan 2018. She was diagnosed with thrush and no treatment for it. Swallowing has improved and her weight is improving. I sent her a new prescription for Dexilant.  Left chest wall pain: There is no palpable lumps or nodules. I discussed with her about measures she can take including taking Tylenol or  Motrin along with other supportive care measures like hydration moisturization essential oils and tonic water. If her symptoms continue persist beyond one month, then we may have to do a scan.  Return to clinic in 1 year with lab count check and follow-up.   I spent 25 minutes talking to the patient of which more than half was spent in counseling and coordination of care.  No orders of the defined types were placed in this encounter.  The patient has a good understanding of the overall plan. she agrees with it. she will call with any problems that may develop before the next visit here.   Rulon Eisenmenger, MD 10/17/16

## 2016-10-17 NOTE — Assessment & Plan Note (Signed)
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 IV iron given 12/02/2015 2 doses Response to IV iron therapy:Improvement in hemoglobin from 10.6-13.8 -------------------------------------------------------------------------------------------------------------------------------------------------------- Hypertension:Under her PCP care. Weight loss: saw Dr.Nandigam. Upper endoscopy was performed Jan 2018  Hypokalemia and hyponatremia: She does not have any diarrheal illness. I suspect this may be because of excessive water consumption and not much salt intake. I gave her instructions on how to take water and salt.  Return to clinic in 6 months with lab count check and follow-up.

## 2016-11-06 MED FILL — DEXILANT DR 60 MG CAPSULE: 60 | 30 days supply | Qty: 30 | Fill #0

## 2016-11-22 MED FILL — BUPRENORPHINE 5 MCG/HR PTCH: 5 | 28 days supply | Qty: 4 | Fill #0

## 2016-11-23 ENCOUNTER — Ambulatory Visit: Payer: Managed Care, Other (non HMO) | Admitting: Hematology and Oncology

## 2016-11-23 ENCOUNTER — Other Ambulatory Visit: Payer: Managed Care, Other (non HMO)

## 2016-12-04 MED FILL — LIDOCAINE PATCH 5%: 5 | 30 days supply | Qty: 30 | Fill #0

## 2016-12-04 MED FILL — DEXILANT DR 60 MG CAPSULE: 60 | 30 days supply | Qty: 30 | Fill #1

## 2016-12-18 ENCOUNTER — Other Ambulatory Visit: Payer: Self-pay | Admitting: Family

## 2016-12-18 DIAGNOSIS — I10 Essential (primary) hypertension: Secondary | ICD-10-CM

## 2016-12-25 ENCOUNTER — Ambulatory Visit (INDEPENDENT_AMBULATORY_CARE_PROVIDER_SITE_OTHER): Payer: 59 | Admitting: Family

## 2016-12-25 ENCOUNTER — Encounter: Payer: Self-pay | Admitting: Family

## 2016-12-25 VITALS — BP 147/75 | HR 83 | Temp 97.6°F | Ht 64.0 in | Wt 98.0 lb

## 2016-12-25 DIAGNOSIS — R636 Underweight: Secondary | ICD-10-CM | POA: Diagnosis not present

## 2016-12-25 DIAGNOSIS — Z720 Tobacco use: Secondary | ICD-10-CM

## 2016-12-25 DIAGNOSIS — I10 Essential (primary) hypertension: Secondary | ICD-10-CM

## 2016-12-25 MED ORDER — HYDROCHLOROTHIAZIDE 25 MG PO TABS: 25.0000 mg | ORAL_TABLET | Freq: Every day | ORAL | 3 refills | Status: DC

## 2016-12-25 NOTE — Progress Notes (Signed)
   Subjective:    Patient ID: Sherry Bender, female    DOB: 04-04-1951, 66 y.o.   MRN: [id number]  Pt presents to the office today for chronic follow up. Pt is followed by Oncologists for left breast cancer.  Hypertension  This is a chronic problem. The current episode started more than 1 year ago. The problem has been waxing and waning since onset. The problem is uncontrolled. Associated symptoms include anxiety. Pertinent negatives include no blurred vision, malaise/fatigue, peripheral edema or shortness of breath. The current treatment provides mild improvement. There is no history of kidney disease, CAD/MI, CVA or heart failure.      Review of Systems  Constitutional: Negative for malaise/fatigue.  Eyes: Negative for blurred vision.  Respiratory: Negative for shortness of breath.   All other systems reviewed and are negative.      Objective:   Physical Exam  Constitutional: She is oriented to person, place, and time. She appears well-developed and well-nourished. No distress.  HENT:  Head: Normocephalic and atraumatic.  Eyes: Pupils are equal, round, and reactive to light.  Neck: Normal range of motion. Neck supple. No thyromegaly present.  Cardiovascular: Normal rate, regular rhythm, normal heart sounds and intact distal pulses.   No murmur heard. Pulmonary/Chest: Effort normal and breath sounds normal. No respiratory distress. She has no wheezes.  Abdominal: Soft. Bowel sounds are normal. She exhibits no distension. There is no tenderness.  Musculoskeletal: Normal range of motion. She exhibits no edema or tenderness.  Neurological: She is alert and oriented to person, place, and time.  Skin: Skin is warm and dry.  Psychiatric: She has a normal mood and affect. Her behavior is normal. Judgment and thought content normal.  Vitals reviewed.     BP (!) 147/75   Pulse 83   Temp 97.6 F (36.4 C) (Oral)   Ht 5\' 4"  (1.626 m)   Wt 98 lb (44.5 kg)   LMP 02/28/1999   BMI 16.82  kg/m      Assessment & Plan:  1. Essential hypertension - hydrochlorothiazide (HYDRODIURIL) 25 MG tablet; Take 1 tablet (25 mg total) by mouth daily.  Dispense: 90 tablet; Refill: 3  2. Tobacco abuse  3. Underweight   PT refuses CMP today, states she had lab work drawn on 10/17/16 PT refuses all Health Maintenance and would like to discuss these with Dr. Lindi Adie, her Oncologists  Pt states she takes her BP at home and her BP is "good" at home and does not want to change medications today DASH diet encouraged  Evelina Dun, FNP

## 2016-12-25 NOTE — Patient Instructions (Signed)

## 2017-01-03 MED FILL — DEXILANT DR 60 MG CAPSULE: 60 | 30 days supply | Qty: 30 | Fill #2

## 2017-01-15 MED FILL — LIDOCAINE PATCH 5%: 5 | 30 days supply | Qty: 30 | Fill #0

## 2017-01-17 ENCOUNTER — Telehealth: Payer: Self-pay

## 2017-01-17 NOTE — Telephone Encounter (Signed)
Called pt to return her vm regarding concern of rash on her perenium area and spasms in her legs. Pt states that when she last saw Dr.Gudena, she had made him aware of this and had suggested for pt to take over the counter medication/ cream and to drink tonic water. Pt states that these symptoms were related to her radiation treatment from feb2018. Pt still having these symptoms and would like to see Dr.Gudena to discuss about what she can do to treat this. Scheduled pt for appt with Dr.Gudena next wed. Pt confirmed and verbalized appt time/date. No further questions at this time.

## 2017-01-23 ENCOUNTER — Other Ambulatory Visit: Payer: Self-pay | Admitting: Emergency Medicine

## 2017-01-23 DIAGNOSIS — C50412 Malignant neoplasm of upper-outer quadrant of left female breast: Secondary | ICD-10-CM

## 2017-01-23 DIAGNOSIS — D509 Iron deficiency anemia, unspecified: Secondary | ICD-10-CM

## 2017-01-23 NOTE — Assessment & Plan Note (Signed)
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 IV iron given 12/02/2015 2 doses Response to IV iron therapy:Improvement in hemoglobin from 10.6-13.8 -------------------------------------------------------------------------------------------------------------------------------------------------------- Hypertension:Under her PCP care. Weight loss: saw Dr.Nandigam. Upper endoscopy was performed Jan 2018. She was diagnosed with thrush and no treatment for it. Swallowing has improved and her weight is improving. I sent her a new prescription for Dexilant.  Left chest wall pain: There is no palpable lumps or nodules. I discussed with her about measures she can take including taking Tylenol or Motrin along with other supportive care measures like hydration moisturization essential oils and tonic water. If her symptoms continue persist beyond one month, then we may have to do a scan.  Return to clinic in 1 year with lab count check and follow-up. 

## 2017-01-24 ENCOUNTER — Other Ambulatory Visit (HOSPITAL_BASED_OUTPATIENT_CLINIC_OR_DEPARTMENT_OTHER): Payer: 59

## 2017-01-24 ENCOUNTER — Ambulatory Visit (HOSPITAL_BASED_OUTPATIENT_CLINIC_OR_DEPARTMENT_OTHER): Payer: 59 | Admitting: Hematology and Oncology

## 2017-01-24 DIAGNOSIS — D509 Iron deficiency anemia, unspecified: Secondary | ICD-10-CM

## 2017-01-24 DIAGNOSIS — C50412 Malignant neoplasm of upper-outer quadrant of left female breast: Secondary | ICD-10-CM

## 2017-01-24 DIAGNOSIS — R21 Rash and other nonspecific skin eruption: Secondary | ICD-10-CM

## 2017-01-24 LAB — CBC WITH DIFFERENTIAL/PLATELET
BASO%: 0.7 % (ref 0.0–2.0)
Basophils Absolute: 0 10*3/uL (ref 0.0–0.1)
EOS%: 4.7 % (ref 0.0–7.0)
Eosinophils Absolute: 0.2 10*3/uL (ref 0.0–0.5)
HCT: 40.5 % (ref 34.8–46.6)
HGB: 13.8 g/dL (ref 11.6–15.9)
LYMPH%: 12.5 % — ABNORMAL LOW (ref 14.0–49.7)
MCH: 32.2 pg (ref 25.1–34.0)
MCHC: 34 g/dL (ref 31.5–36.0)
MCV: 94.6 fL (ref 79.5–101.0)
MONO#: 0.3 10*3/uL (ref 0.1–0.9)
MONO%: 7.8 % (ref 0.0–14.0)
NEUT#: 3.2 10*3/uL (ref 1.5–6.5)
NEUT%: 74.3 % (ref 38.4–76.8)
Platelets: 191 10*3/uL (ref 145–400)
RBC: 4.29 10*6/uL (ref 3.70–5.45)
RDW: 12.9 % (ref 11.2–14.5)
WBC: 4.3 10*3/uL (ref 3.9–10.3)
lymph#: 0.5 10*3/uL — ABNORMAL LOW (ref 0.9–3.3)

## 2017-01-24 LAB — COMPREHENSIVE METABOLIC PANEL
ALT: 22 U/L (ref 0–55)
AST: 27 U/L (ref 5–34)
Albumin: 4 g/dL (ref 3.5–5.0)
Alkaline Phosphatase: 54 U/L (ref 40–150)
Anion Gap: 9 mEq/L (ref 3–11)
BUN: 7.4 mg/dL (ref 7.0–26.0)
CO2: 31 mEq/L — ABNORMAL HIGH (ref 22–29)
Calcium: 9.9 mg/dL (ref 8.4–10.4)
Chloride: 90 mEq/L — ABNORMAL LOW (ref 98–109)
Creatinine: 0.5 mg/dL — ABNORMAL LOW (ref 0.6–1.1)
EGFR: >90 mL/min/{1.73_m2} (ref >90)
Glucose: 111 mg/dl (ref 70–140)
Potassium: 4.1 mEq/L (ref 3.5–5.1)
Sodium: 130 mEq/L — ABNORMAL LOW (ref 136–145)
Total Bilirubin: 0.33 mg/dL (ref 0.20–1.20)
Total Protein: 7.1 g/dL (ref 6.4–8.3)

## 2017-01-24 NOTE — Progress Notes (Signed)
Patient Care Team: Claretta Fraise, MD as PCP - General (Family Medicine) Dr Geanie Berlin (Gynecology) Holley Bouche, NP as Nurse Practitioner (Nurse Practitioner) Nicholas Lose, MD as Consulting Physician (Hematology and Oncology) Fanny Skates, MD as Consulting Physician (General Surgery) Kyung Rudd, MD as Consulting Physician (Radiation Oncology) Sylvan Cheese, NP as Nurse Practitioner (Hematology and Oncology)  DIAGNOSIS:  Encounter Diagnosis  Name Primary?  . Malignant neoplasm of upper-outer quadrant of left female breast, unspecified estrogen receptor status ([place])     SUMMARY OF ONCOLOGIC HISTORY:   Breast cancer of upper-outer quadrant of left female breast ([place])   11/09/2014 Mammogram    Left breast irregular hypoechoic mass 12:00 position 5.3 x 3.7 x 2.9 cm, left low axilla normal sized lymph nodes      11/09/2014 Initial Diagnosis    Left Bx: IDC with DCIS; Grade 3, ER - (0%), PR- (0%), Her 2 Neg, Ki 67: 70%      11/26/2014 Breast MRI    3.5 x 4.5 x 3.8 cm biopsy-proven malignancy within the upper/ upper inner left breast. Scattered foci adjacent to the biopsy-proven malignancy and within the upper left breast are indeterminate.      11/26/2014 Clinical Stage    Stage IIB: T3 N0      11/30/2014 -  Neo-Adjuvant Chemotherapy    Dose dense Adriamycin and Cytoxan 4 followed by Abraxane weekly 12      04/05/2015 Breast MRI    Breast complete imaging response to neoadjuvant chemotherapy      04/30/2015 Surgery    Left mastectomy: IDC 2 mm residual disease, 1 lymph node positive for isolated tumor cells within normal lymphatics. 0/3 Lymph Nodes      04/30/2015 Pathologic Stage    Stage IA: pT1a pN0      06/28/2015 - 08/12/2015 Radiation Therapy    Adjuvant radiation therapy: Left chest wall and supraclavicular fossa 50.4 Gy over 28 fractions; left chest wall boost 10 Gy over 5 fractions. Total dose 60.4 Gy      12/02/2015 Survivorship    Survivorship  care plan provided to patient       CHIEF COMPLIANT: Surveillance of breast cancer as well as follow-up of iron deficiency anemia  INTERVAL HISTORY: Sherry Bender is a 66 year old lady with above-mentioned history of left breast cancer treated with neoadjuvant chemotherapy and had a complete pathologic response. She had a mastectomy and adjuvant radiation therapy. She is currently on surveillance appears to be doing quite well. She denies any lumps or nodules in the breast. Since she is ER/PR negative she did not need oral antiestrogen therapies. She had a prior history of iron deficiency anemia and required IV iron therapy. She currently has neuropathy related to prior chemotherapy.  REVIEW OF SYSTEMS:   Constitutional: Denies fevers, chills or abnormal weight loss Eyes: Denies blurriness of vision Ears, nose, mouth, throat, and face: Denies mucositis or sore throat Respiratory: Denies cough, dyspnea or wheezes Cardiovascular: Denies palpitation, chest discomfort Gastrointestinal:  Denies nausea, heartburn or change in bowel habits Skin: Denies abnormal skin rashes Lymphatics: Denies new lymphadenopathy or easy bruising Neurological chemotherapy-induced neuropathy Behavioral/Psych: Mood is stable, no new changes  Extremities: No lower extremity edema Breast:  denies any pain or lumps or nodules in either breasts All other systems were reviewed with the patient and are negative.  I have reviewed the past medical history, past surgical history, social history and family history with the patient and they are unchanged from previous note.  ALLERGIES:  is allergic to codeine.  MEDICATIONS:  Current Outpatient Prescriptions  Medication Sig Dispense Refill  . buprenorphine (BUTRANS - DOSED MCG/HR) 5 MCG/HR PTWK patch   0  . DEXILANT 60 MG capsule Take 1 capsule (60 mg total) by mouth daily. 30 capsule 11  . estradiol (ESTRACE) 1 MG tablet Take 1 mg by mouth daily.    . hydrochlorothiazide  (HYDRODIURIL) 25 MG tablet Take 1 tablet (25 mg total) by mouth daily. 90 tablet 3  . lidocaine (LIDODERM) 5 %   0  . tolterodine (DETROL LA) 2 MG 24 hr capsule Take 1 capsule (2 mg total) by mouth daily. 90 capsule 3   No current facility-administered medications for this visit.     PHYSICAL EXAMINATION: ECOG PERFORMANCE STATUS: 1 - Symptomatic but completely ambulatory  There were no vitals filed for this visit. There were no vitals filed for this visit.  GENERAL:alert, no distress and comfortable SKIN: skin color, texture, turgor are normal, no rashes or significant lesions EYES: normal, Conjunctiva are pink and non-injected, sclera clear OROPHARYNX:no exudate, no erythema and lips, buccal mucosa, and tongue normal  NECK: supple, thyroid normal size, non-tender, without nodularity LYMPH:  no palpable lymphadenopathy in the cervical, axillary or inguinal LUNGS: clear to auscultation and percussion with normal breathing effort HEART: regular rate & rhythm and no murmurs and no lower extremity edema ABDOMEN:abdomen soft, non-tender and normal bowel sounds MUSCULOSKELETAL:no cyanosis of digits and no clubbing  NEURO: alert & oriented x 3 with fluent speech, no focal motor/sensory deficits EXTREMITIES: No lower extremity edema BREAST: No palpable masses or nodules in either right or left breasts. No palpable axillary supraclavicular or infraclavicular adenopathy no breast tenderness or nipple discharge. (exam performed in the presence of a chaperone)  LABORATORY DATA:  I have reviewed the data as listed   Chemistry      Component Value Date/Time   NA 130 (L) 01/24/2017 1425   K 4.1 01/24/2017 1425   CL 91 (L) 11/16/2015 1414   CO2 31 (H) 01/24/2017 1425   BUN 7.4 01/24/2017 1425   CREATININE 0.5 (L) 01/24/2017 1425      Component Value Date/Time   CALCIUM 9.9 01/24/2017 1425   ALKPHOS 54 01/24/2017 1425   AST 27 01/24/2017 1425   ALT 22 01/24/2017 1425   BILITOT 0.33  01/24/2017 1425       Lab Results  Component Value Date   WBC 4.3 01/24/2017   HGB 13.8 01/24/2017   HCT 40.5 01/24/2017   MCV 94.6 01/24/2017   PLT 191 01/24/2017   NEUTROABS 3.2 01/24/2017    ASSESSMENT & PLAN:  Breast cancer of upper-outer quadrant of left female breast ([place]) 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 IV iron given 12/02/2015 2 doses Response to IV iron therapy:Improvement in hemoglobin from 10.6-13.8 -------------------------------------------------------------------------------------------------------------------------------------------------------- Hypertension:Under her PCP care. Weight loss: No major improvement.  GERD: On Dexilant.  Anal Rash: Prescribed Miconazole ointment. If it doesn't improve, then she will  ned a referral to Surgery..  Return to clinic in 1 year with lab count check and follow-up.  I spent 25 minutes talking to the patient of which more than half was spent in counseling and coordination of care.  No orders of the defined types were placed in this encounter.  The patient has a good understanding of the overall plan. she agrees with it. she will call with any problems that may develop before the next visit here.   Rulon Eisenmenger,  MD 01/24/17

## 2017-01-25 ENCOUNTER — Telehealth: Payer: Self-pay

## 2017-01-25 LAB — FERRITIN: Ferritin: 104 ng/ml (ref 9–269)

## 2017-01-25 LAB — IRON AND TIBC
%SAT: 26 % (ref 21–57)
Iron: 67 ug/dL (ref 41–142)
TIBC: 258 ug/dL (ref 236–444)
UIBC: 191 ug/dL (ref 120–384)

## 2017-01-25 NOTE — Telephone Encounter (Signed)
Called pt to notify her that Dr.Gudena would like for pt to increase her sodium intake d/t low NA 130 on her lab work. Pt takes hctz for bp. Told pt to notify her primary doctor of her results and to see if they have any suggestions with managing her bp medication and diet modifications with her salt intake. Pt states that she is not very pleased with her current pcp and would like to see a different dr. Asking for suggestions on where she can transfer care. Suggested [facility] that offers primary care services. Provided numbers for pt to inquire. Pt given instructions on increasing salt intake and foods to consider. Told pt to monitor blood pressure and swelling in the ankles as well, and to notify primary dr of changes. Pt verbalized understanding.

## 2017-02-05 MED FILL — DEXILANT DR 60 MG CAPSULE: 60 | 30 days supply | Qty: 30 | Fill #3

## 2017-02-06 ENCOUNTER — Other Ambulatory Visit: Payer: Self-pay | Admitting: Family

## 2017-02-06 DIAGNOSIS — I10 Essential (primary) hypertension: Secondary | ICD-10-CM

## 2017-03-06 MED FILL — DEXILANT DR 60 MG CAPSULE: 60 | 30 days supply | Qty: 30 | Fill #4

## 2017-03-19 MED FILL — LIDOCAINE PATCH 5%: 5 | 30 days supply | Qty: 30 | Fill #0

## 2017-03-26 ENCOUNTER — Telehealth: Payer: Self-pay

## 2017-03-26 NOTE — Telephone Encounter (Addendum)
Pt calling to notify Dr.Gudena that she is still having leg spasms/ cramping since the last time she saw him. Pt was advised to take tonic water to help but pt states no improvement. Told pt to go see her pcp to follow up with more testing. Pt states that she doesn't like her current pcp and would like to change to a different practice. Gave pt suggestions on [facility]. Provided pt phone numbers to establish a new primary dr.   Burnard Bunting that pt try potassium/magnesium rich foods/supplements and increase oral hydration. Pt on a diuretic pill for bp. Told pt to contact her current pcp to notify them of her symptoms and see if they can do lab work to check her electrolytes and possibly prescribe potassium. Pt denies any other symptoms at this time.  Pt will be in [place] area tomorrow to see her eye dr and would like to come by and get a flu shot. Told pt that she may come and receive it in the office anytime. Pt verbalized understanding and has no further questions at this time.

## 2017-03-27 ENCOUNTER — Telehealth: Payer: Self-pay

## 2017-03-27 NOTE — Telephone Encounter (Signed)
na

## 2017-03-28 ENCOUNTER — Other Ambulatory Visit (HOSPITAL_BASED_OUTPATIENT_CLINIC_OR_DEPARTMENT_OTHER): Payer: 59

## 2017-03-28 ENCOUNTER — Other Ambulatory Visit: Payer: 59

## 2017-03-28 DIAGNOSIS — Z23 Encounter for immunization: Secondary | ICD-10-CM

## 2017-03-28 MED ORDER — INFLUENZA VAC SPLIT QUAD 0.5 ML IM SUSY
0.5000 mL | PREFILLED_SYRINGE | Freq: Once | INTRAMUSCULAR | Status: AC
  Administered 2017-03-28: 0.5 mL via INTRAMUSCULAR
  Filled 2017-03-28: qty 0.5

## 2017-03-28 NOTE — Progress Notes (Signed)
Pt came in for flu shot today. R deltoid. Pt given education information. Pt appreciative of time.

## 2017-04-05 MED FILL — DEXILANT DR 60 MG CAPSULE: 60 | 30 days supply | Qty: 30 | Fill #5

## 2017-05-04 MED FILL — LIDOCAINE PATCH 5%: 5 | 30 days supply | Qty: 30 | Fill #0

## 2017-05-04 MED FILL — DEXILANT DR 60 MG CAPSULE: 60 | 30 days supply | Qty: 30 | Fill #6

## 2017-05-07 ENCOUNTER — Encounter: Payer: Self-pay | Admitting: Family

## 2017-05-07 ENCOUNTER — Ambulatory Visit: Payer: 59 | Admitting: Family

## 2017-05-07 VITALS — BP 139/82 | HR 94 | Temp 97.5°F | Ht 64.0 in | Wt 100.2 lb

## 2017-05-07 DIAGNOSIS — I10 Essential (primary) hypertension: Secondary | ICD-10-CM

## 2017-05-07 DIAGNOSIS — R252 Cramp and spasm: Secondary | ICD-10-CM

## 2017-05-07 MED ORDER — LOSARTAN POTASSIUM 50 MG PO TABS: 50.0000 mg | ORAL_TABLET | Freq: Every day | ORAL | 3 refills | Status: DC

## 2017-05-07 MED ORDER — CYCLOBENZAPRINE HCL 5 MG PO TABS: 5.0000 mg | ORAL_TABLET | Freq: Three times a day (TID) | ORAL | 1 refills | Status: DC | PRN

## 2017-05-07 MED ORDER — TOLTERODINE TARTRATE ER 2 MG PO CP24: 2.0000 mg | ORAL_CAPSULE | Freq: Every day | ORAL | 0 refills | Status: DC

## 2017-05-07 NOTE — Progress Notes (Signed)
   Subjective:    Patient ID: Sherry Bender, female    DOB: 11/27/1950, 66 y.o.   MRN: [id number]  HPI Pt presents to the office today with bilateral leg cramps that started months ago that has not improved. Pt saw her Oncologists who told her to follow up with her PCP.  Pt's sodium was low, but pt states she has tried eating potatoes chips, chicken noodle soup, and crackers. Pt states her cramps happen all day and hurt constantly of 9 out 10. Pt states the pain wakes her up at night.    Review of Systems  All other systems reviewed and are negative.      Objective:   Physical Exam  Constitutional: She is oriented to person, place, and time. She appears well-developed and well-nourished. No distress.  HENT:  Head: Normocephalic.  Eyes: Pupils are equal, round, and reactive to light.  Neck: Normal range of motion. Neck supple. No thyromegaly present.  Cardiovascular: Normal rate, regular rhythm, normal heart sounds and intact distal pulses.  No murmur heard. Pulmonary/Chest: Effort normal and breath sounds normal. No respiratory distress. She has no wheezes.  Abdominal: Soft. Bowel sounds are normal. She exhibits no distension. There is no tenderness.  Musculoskeletal: Normal range of motion. She exhibits no edema or tenderness.  Neurological: She is alert and oriented to person, place, and time.  Skin: Skin is warm and dry.  Psychiatric: She has a normal mood and affect. Her behavior is normal. Judgment and thought content normal.  Vitals reviewed.     BP 139/82   Pulse 94   Temp (!) 97.5 F (36.4 C) (Oral)   Ht '5\' 4"'$  (1.626 m)   Wt 100 lb 3.2 oz (45.5 kg)   LMP 02/28/1999   BMI 17.20 kg/m      Assessment & Plan:  1. Leg cramps Will stop HCTZ related to leg cramps and start Losartan 50 mg today Stay hydrated Encourage stretching  - BMP8+EGFR - Magnesium - cyclobenzaprine (FLEXERIL) 5 MG tablet; Take 1 tablet (5 mg total) 3 (three) times daily as needed by mouth for  muscle spasms.  Dispense: 60 tablet; Refill: 1  2. Essential hypertension Will stop HCTZ related to leg cramps and start Losartan 50 mg today - BMP8+EGFR - losartan (COZAAR) 50 MG tablet; Take 1 tablet (50 mg total) daily by mouth.  Dispense: 90 tablet; Refill: [place], FNP

## 2017-05-07 NOTE — Patient Instructions (Signed)
Leg Cramps Leg cramps occur when a muscle or muscles tighten and you have no control over this tightening (involuntary muscle contraction). Muscle cramps can develop in any muscle, but the most common place is in the calf muscles of the leg. Those cramps can occur during exercise or when you are at rest. Leg cramps are painful, and they may last for a few seconds to a few minutes. Cramps may return several times before they finally stop. Usually, leg cramps are not caused by a serious medical problem. In many cases, the cause is not known. Some common causes include:  Overexertion.  Overuse from repetitive motions, or doing the same thing over and over.  Remaining in a certain position for a long period of time.  Improper preparation, form, or technique while performing a sport or an activity.  Dehydration.  Injury.  Side effects of some medicines.  Abnormally low levels of the salts and ions in your blood (electrolytes), especially potassium and calcium. These levels could be low if you are taking water pills (diuretics) or if you are pregnant.  Follow these instructions at home: Watch your condition for any changes. Taking the following actions may help to lessen any discomfort that you are feeling:  Stay well-hydrated. Drink enough fluid to keep your urine clear or pale yellow.  Try massaging, stretching, and relaxing the affected muscle. Do this for several minutes at a time.  For tight or tense muscles, use a warm towel, heating pad, or hot shower water directed to the affected area.  If you are sore or have pain after a cramp, applying ice to the affected area may relieve discomfort. ? Put ice in a plastic bag. ? Place a towel between your skin and the bag. ? Leave the ice on for 20 minutes, 2-3 times per day.  Avoid strenuous exercise for several days if you have been having frequent leg cramps.  Make sure that your diet includes the essential minerals for your muscles to  work normally.  Take medicines only as directed by your health care provider.  Contact a health care provider if:  Your leg cramps get more severe or more frequent, or they do not improve over time.  Your foot becomes cold, numb, or blue. This information is not intended to replace advice given to you by your health care provider. Make sure you discuss any questions you have with your health care provider. Document Released: 07/13/2004 Document Revised: 11/11/2015 Document Reviewed: 05/13/2014 Elsevier Interactive Patient Education  2018 [employer]  

## 2017-05-08 LAB — BMP8+EGFR
BUN/Creatinine Ratio: 32 — ABNORMAL HIGH (ref 12–28)
BUN: 11 mg/dL (ref 8–27)
CO2: 30 mmol/L — ABNORMAL HIGH (ref 20–29)
Calcium: 9.6 mg/dL (ref 8.7–10.3)
Chloride: 92 mmol/L — ABNORMAL LOW (ref 96–106)
Creatinine, Ser: 0.34 mg/dL — ABNORMAL LOW (ref 0.57–1.00)
GFR calc Af Amer: 132 mL/min/{1.73_m2} (ref >59)
GFR calc non Af Amer: 115 mL/min/{1.73_m2} (ref >59)
Glucose: 79 mg/dL (ref 65–99)
Potassium: 3.9 mmol/L (ref 3.5–5.2)
Sodium: 136 mmol/L (ref 134–144)

## 2017-05-08 LAB — MAGNESIUM: Magnesium: 1.8 mg/dL (ref 1.6–2.3)

## 2017-05-14 ENCOUNTER — Emergency Department (HOSPITAL_COMMUNITY): Payer: 59

## 2017-05-14 ENCOUNTER — Encounter (HOSPITAL_COMMUNITY): Payer: Self-pay | Admitting: Emergency Medicine

## 2017-05-14 ENCOUNTER — Emergency Department (HOSPITAL_COMMUNITY)
Admission: EM | Admit: 2017-05-14 | Discharge: 2017-05-14 | Disposition: A | Discharge status: Home / Self Care | Payer: 59 | Attending: Emergency Medicine | Admitting: Emergency Medicine

## 2017-05-14 DIAGNOSIS — S42292A Other displaced fracture of upper end of left humerus, initial encounter for closed fracture: Secondary | ICD-10-CM

## 2017-05-14 DIAGNOSIS — W0110XA Fall on same level from slipping, tripping and stumbling with subsequent striking against unspecified object, initial encounter: Secondary | ICD-10-CM | POA: Diagnosis not present

## 2017-05-14 DIAGNOSIS — Y9389 Activity, other specified: Secondary | ICD-10-CM | POA: Diagnosis not present

## 2017-05-14 DIAGNOSIS — Y9289 Other specified places as the place of occurrence of the external cause: Secondary | ICD-10-CM | POA: Diagnosis not present

## 2017-05-14 DIAGNOSIS — S4982XA Other specified injuries of left shoulder and upper arm, initial encounter: Secondary | ICD-10-CM | POA: Diagnosis present

## 2017-05-14 DIAGNOSIS — Z85828 Personal history of other malignant neoplasm of skin: Secondary | ICD-10-CM | POA: Diagnosis not present

## 2017-05-14 DIAGNOSIS — Z79899 Other long term (current) drug therapy: Secondary | ICD-10-CM | POA: Diagnosis not present

## 2017-05-14 DIAGNOSIS — Z87891 Personal history of nicotine dependence: Secondary | ICD-10-CM | POA: Diagnosis not present

## 2017-05-14 DIAGNOSIS — Y999 Unspecified external cause status: Secondary | ICD-10-CM | POA: Diagnosis not present

## 2017-05-14 DIAGNOSIS — I10 Essential (primary) hypertension: Secondary | ICD-10-CM | POA: Insufficient documentation

## 2017-05-14 DIAGNOSIS — S42202A Unspecified fracture of upper end of left humerus, initial encounter for closed fracture: Secondary | ICD-10-CM | POA: Insufficient documentation

## 2017-05-14 DIAGNOSIS — Z853 Personal history of malignant neoplasm of breast: Secondary | ICD-10-CM | POA: Diagnosis not present

## 2017-05-14 MED ORDER — ONDANSETRON 4 MG PO TBDP: 4.0000 mg | ORAL_TABLET | Freq: Three times a day (TID) | ORAL | 0 refills | Status: DC | PRN

## 2017-05-14 MED ORDER — OXYCODONE HCL 5 MG PO TABS: 5.0000 mg | ORAL_TABLET | ORAL | 0 refills | Status: DC | PRN

## 2017-05-14 MED ORDER — DOCUSATE SODIUM 250 MG PO CAPS: 250.0000 mg | ORAL_CAPSULE | Freq: Every day | ORAL | 0 refills | Status: DC

## 2017-05-14 MED ORDER — OXYCODONE-ACETAMINOPHEN 5-325 MG PO TABS
1.0000 | ORAL_TABLET | Freq: Once | ORAL | Status: AC
  Administered 2017-05-14: 1 via ORAL
  Filled 2017-05-14: qty 1

## 2017-05-14 MED ORDER — ONDANSETRON 4 MG PO TBDP
4.0000 mg | ORAL_TABLET | Freq: Once | ORAL | Status: AC
  Administered 2017-05-14: 4 mg via ORAL
  Filled 2017-05-14: qty 1

## 2017-05-14 NOTE — Discharge Instructions (Addendum)
You were seen today for a HUMERUS FRACTURE. Wear your sling at all times. Do not use your left arm AT ALL until cleared by an orthopedic doctor.  Call Dr. Randel Pigg office at [phone] first thing tomorrow morning. Tell them that you need an appointment with Dr. Marlou Sa or his partners on Wednesday, or sooner if available, for follow-up of your ARM FRACTURE. Let them know you were seen in the ER tonight.  I have also placed a consult to our social workers. They may call you tomorrow to help you set up rides and/or home health if needed.  If you have unbearable pain or are unable to care for yourself at home, return to the ER as we discussed.

## 2017-05-14 NOTE — ED Provider Notes (Signed)
[facility] DEPT Provider Note   CSN: [id number] Arrival date & time: 05/14/17  1447     History   Chief Complaint Chief Complaint  Patient presents with  . Shoulder Pain  . Fall    HPI Sherry Bender is a 66 y.o. female.  HPI   66 year old female with past medical history as below here with left shoulder pain.  The patient states she was turning around from walking her front door today when she slipped and fell onto her left shoulder.  She states she may have skimmed her head on the door on the way down, but denies any direct head trauma.  There is no loss of consciousness.  She reports immediate onset of left shoulder pain.  She try to get herself up off the floor, but can do it with one arm only, so she called her husband who got her off the floor approximately 2 hours later.  She denies any headache, nausea, vomiting since the episode.  She is not any blood thinners.  She endorses severe, aching, throbbing left arm pain that is worse with any movement or palpation.  Denies any numbness or weakness.  Denies any neck pain or back pain.  No other trauma.  No hip pain.  Past Medical History:  Diagnosis Date  . Anxiety   . Arthritis    IN BACK....  . Breast cancer ([place]) 2016  . 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 Complication of anesthesia    "HARD TO WAKE UP".......Marland KitchenN/V  . GERD (gastroesophageal reflux disease)   . Hyperlipidemia   . Hypertension   . PONV (postoperative nausea and vomiting)   . Scoliosis   . Urinary frequency     Patient Active Problem List   Diagnosis Date Noted  . Underweight 12/25/2016  . Loss of weight 07/03/2016  . History of esophageal stricture 07/03/2016  . Dysphagia 06/08/2016  . Hypersensitivity reaction 12/02/2015  . Iron deficiency anemia 11/24/2015  . Breast cancer of upper-outer quadrant of left female breast ([place]) 11/11/2014  . S/P cervical spinal fusion 06/24/2014  . HTN (hypertension) 12/06/2010    . Tobacco abuse 12/06/2010  . Hyperlipidemia 09/26/2010  . Nodular goiter 09/26/2010  . GERD (gastroesophageal reflux disease) 09/26/2010  . Degenerative disc disease 09/26/2010  . Allergic rhinitis 09/26/2010  . Anxiety disorder 09/26/2010  . Cyst, breast 09/26/2010    Past Surgical History:  Procedure Laterality Date  . ABDOMINAL HYSTERECTOMY  2000   TAH/BSO  . ANTERIOR CERVICAL DECOMP/DISCECTOMY FUSION N/A 06/18/2014   Procedure: C4-5 C5-6 C6-7 ANTERIOR CERVICAL DECOMPRESSION/DISKECTOMY/FUSION;  Surgeon: Eustace Moore, MD;  Location: [facility];  Service: Neurosurgery;  Laterality: N/A;  C4-5 C5-6 C6-7 ANTERIOR CERVICAL DECOMPRESSION/DISKECTOMY/FUSION  . ANTERIOR CERVICAL DECOMP/DISCECTOMY FUSION N/A 06/25/2014   Procedure: EXPLORATION OF CERVICAL FUSION WITH REVIOSN OF HARDWARE;  Surgeon: Eustace Moore, MD;  Location: [facility];  Service: Neurosurgery;  Laterality: N/A;  . CERVICAL FUSION    . CHOLECYSTECTOMY  1994  . COMPLETE MASTECTOMY W/ SENTINEL NODE BIOPSY Left 04/30/2015  . FOOT NEUROMA SURGERY  01/2010   also had metatarasl shortened  . KNEE ARTHROSCOPY  2005   right knee  . PORT-A-CATH REMOVAL Right 04/30/2015   Procedure: REMOVAL PORT-A-CATH;  Surgeon: Fanny Skates, MD;  Location: [facility];  Service: General;  Laterality: Right;  . PORTACATH PLACEMENT N/A 11/23/2014   Procedure: INSERTION PORT-A-CATH WITH ULTRASOUND;  Surgeon: Fanny Skates, MD;  Location: [facility];  Service: General;  Laterality: N/A;  . RHINOPLASTY    . SIMPLE MASTECTOMY WITH AXILLARY SENTINEL NODE BIOPSY Left 04/30/2015   Procedure: LEFT TOTAL  MASTECTOMY WITH LEFT AXILLARY SENTINEL NODE BIOPSY;  Surgeon: Fanny Skates, MD;  Location: [facility];  Service: General;  Laterality: Left;    OB History    No data available       Home Medications    Prior to Admission medications   Medication Sig Start Date End Date Taking? Authorizing Provider  cyclobenzaprine (FLEXERIL) 5 MG  tablet Take 1 tablet (5 mg total) 3 (three) times daily as needed by mouth for muscle spasms. 05/07/17  Yes Hawks, Christy A, FNP  DEXILANT 60 MG capsule Take 1 capsule (60 mg total) by mouth daily. 10/17/16  Yes Nicholas Lose, MD  estradiol (ESTRACE) 1 MG tablet Take 1 mg by mouth daily.   Yes [provider]  lidocaine (LIDODERM) 5 %  12/04/16  Yes [provider]  losartan (COZAAR) 50 MG tablet Take 1 tablet (50 mg total) daily by mouth. Patient taking differently: Take 25 mg by mouth daily.  05/07/17  Yes Hawks, Christy A, FNP  polyvinyl alcohol (LIQUIFILM TEARS) 1.4 % ophthalmic solution Place 1 drop into both eyes as needed for dry eyes.   Yes [provider]  tolterodine (DETROL LA) 2 MG 24 hr capsule Take 1 capsule (2 mg total) daily by mouth. 05/07/17  Yes Hawks, Christy A, FNP  docusate sodium (COLACE) 250 MG capsule Take 1 capsule (250 mg total) by mouth daily. 05/14/17   Duffy Bruce, MD  ondansetron (ZOFRAN ODT) 4 MG disintegrating tablet Take 1 tablet (4 mg total) by mouth every 8 (eight) hours as needed for nausea or vomiting. 05/14/17   Duffy Bruce, MD  oxyCODONE (ROXICODONE) 5 MG immediate release tablet Take 1 tablet (5 mg total) by mouth every 4 (four) hours as needed for moderate pain or severe pain (Take one half tablet for moderate pain, one full tablet for severe pain). 05/14/17   Duffy Bruce, MD    Family History Family History  Problem Relation Age of Onset  . Heart disease Mother   . Hypertension Mother   . Diabetes Mother   . Heart disease Father   . Hypertension Father   . Cancer Maternal Grandmother        ? ovarian cancer vs. cervical  . Cancer Paternal Aunt        ? breast    Social History Social History   Tobacco Use  . Smoking status: Former Smoker    Packs/day: 0.25    Years: 42.00    Pack years: 10.50    Types: Cigarettes    Last attempt to quit: 01/24/2017    Years since quitting: 0.3  . Smokeless tobacco:  Never Used  Substance Use Topics  . Alcohol use: No  . Drug use: No     Allergies   Codeine   Review of Systems Review of Systems  Constitutional: Negative for chills and fever.  HENT: Negative for congestion, rhinorrhea and sore throat.   Eyes: Negative for visual disturbance.  Respiratory: Negative for cough, shortness of breath and wheezing.   Cardiovascular: Negative for chest pain and leg swelling.  Gastrointestinal: Negative for abdominal pain, diarrhea, nausea and vomiting.  Genitourinary: Negative for dysuria, flank pain, vaginal bleeding and vaginal discharge.  Musculoskeletal: Positive for arthralgias and joint swelling. Negative for neck pain.  Skin: Negative for rash.  Allergic/Immunologic: Negative for  immunocompromised state.  Neurological: Negative for syncope and headaches.  Hematological: Does not bruise/bleed easily.  All other systems reviewed and are negative.    Physical Exam Updated Vital Signs BP 136/82   Pulse 79   Temp 97.9 F (36.6 C) (Oral)   Resp 16   LMP 02/28/1999   SpO2 97%   Physical Exam  Constitutional: She is oriented to person, place, and time. She appears well-developed and well-nourished. No distress.  HENT:  Head: Normocephalic and atraumatic.  No abrasions or contusions.  No scalp tenderness.  No apparent facial or head trauma.  Eyes: Conjunctivae are normal.  Neck: Neck supple.  No midline neck tenderness.  Normal, full, painless range of motion.  Cardiovascular: Normal rate, regular rhythm and normal heart sounds. Exam reveals no friction rub.  No murmur heard. Pulmonary/Chest: Effort normal and breath sounds normal. No respiratory distress. She has no wheezes. She has no rales.  Abdominal: She exhibits no distension.  Musculoskeletal: She exhibits no edema.  Neurological: She is alert and oriented to person, place, and time. She exhibits normal muscle tone.  Skin: Skin is warm. Capillary refill takes less than 2 seconds.    Psychiatric: She has a normal mood and affect.  Nursing note and vitals reviewed.   UPPER EXTREMITY EXAM: LEFT  INSPECTION & PALPATION: Moderate swelling and market tenderness to palpation over the proximal lateral upper arm/deltoid region.  No bruising or deformity.  No open wounds.  SENSORY: Sensation is intact to light touch in:  Superficial radial nerve distribution (dorsal first web space) Median nerve distribution (tip of index finger)   Ulnar nerve distribution (tip of small finger)     MOTOR:  + Motor posterior interosseous nerve (thumb IP extension) + Anterior interosseous nerve (thumb IP flexion, index finger DIP flexion) + Radial nerve (wrist extension) + Median nerve (palpable firing thenar mass) + Ulnar nerve (palpable firing of first dorsal interosseous muscle)  VASCULAR: 2+ radial pulse Brisk capillary refill < 2 sec, fingers warm and well-perfused    ED Treatments / Results  Labs (all labs ordered are listed, but only abnormal results are displayed) Labs Reviewed - No data to display  EKG  EKG Interpretation None       Radiology Dg Shoulder Left  Result Date: 05/14/2017 CLINICAL DATA:  Left shoulder pain after fall at home. EXAM: LEFT SHOULDER - 2+ VIEW COMPARISON:  None. FINDINGS: Mildly displaced fracture is seen involving the proximal left humeral neck. No dislocation is noted. Visualized ribs appear normal. IMPRESSION: Mildly displaced proximal left humeral neck fracture. Electronically Signed   By: Marijo Conception, M.D.   On: 05/14/2017 16:19    Procedures Procedures (including critical care time)  Medications Ordered in ED Medications  oxyCODONE-acetaminophen (PERCOCET/ROXICET) 5-325 MG per tablet 1 tablet (1 tablet Oral Given 05/14/17 1946)  ondansetron (ZOFRAN-ODT) disintegrating tablet 4 mg (4 mg Oral Given 05/14/17 1946)     Initial Impression / Assessment and Plan / ED Course  I have reviewed the triage vital signs and the nursing  notes.  Pertinent labs & imaging results that were available during my care of the patient were reviewed by me and considered in my medical decision making (see chart for details).     66 year old right-hand-dominant female here with left upper shoulder pain after mechanical fall.  Patient denies any significant head trauma and has had no nausea, vomiting, headache, dizziness, or other symptoms to suggest intracranial trauma and she is not on blood thinners.  Do not feel CT imaging is indicated and the patient is in agreement and would like to hold at this time.  Otherwise, plain films confirm mildly displaced left proximal humeral neck fracture.  She was placed in a sling immobilizer and given p.o. analgesia with improvement.  She declines any IV analgesia as she has not tolerated this in the past.  I do long discussion with the patient and her husband.  She lives with her husband who has to work during the day, but is able to take time off.  I offered potential placement given that she is concerned she may have difficulty getting around the house, the patient is adamant she will return home.  I do feel she may benefit from in-home PT or assistance during her recovery.  I will place an outpatient social work and case management consultation, and encouraged her return if she is unable to care for herself.  Otherwise, I discussed the case and imaging with Dr. Marlou Sa and she will follow-up with orthopedics early this week.  This note was prepared with assistance of Systems analyst. Occasional wrong-word or sound-a-like substitutions may have occurred due to the inherent limitations of voice recognition software.   Final Clinical Impressions(s) / ED Diagnoses   Final diagnoses:  Closed fracture of head of left humerus, initial encounter    ED Discharge Orders        Ordered    oxyCODONE (ROXICODONE) 5 MG immediate release tablet  Every 4 hours PRN     05/14/17 2053    ondansetron  (ZOFRAN ODT) 4 MG disintegrating tablet  Every 8 hours PRN     05/14/17 2053    docusate sodium (COLACE) 250 MG capsule  Daily     05/14/17 2053       Duffy Bruce, MD 05/14/17 2155

## 2017-05-14 NOTE — ED Triage Notes (Signed)
Pt reports she slipped backwards on the floor this afternoon. Pt hit the back of her L shoulder and head. No LOC. Pt mostly complains of L shoulder pain. Not on blood thinners.

## 2017-05-15 NOTE — Care Management Note (Signed)
Case Management Note  Patient Details  Name: Sherry Bender MRN: [id number] Date of Birth: [birth year]  Subjective/Objective:                  66 year old female with past medical history as below here with left shoulder pain.   Action/Plan: CM consulted for HHS.  From home with spouse.  Called and spoke with pt who chose [facility] agency.  Advised her that they would assess DME since any equipment that CM got her today would have to be mailed.  Contacted Crystal with [facility] who accepted the pt.  No further CM needs noted at this time.  Expected Discharge Date:   05/14/2017               Expected Discharge Plan:  [place]  Discharge planning Services  CM Consult  Post Acute Care Choice:  Home Health Choice offered to:  Patient  HH Arranged:  RN, PT, OT, Nurse's Aide [facility] Agency:  [place]  Status of Service:  Completed, signed off  Rae Mar, RN 05/15/2017, 3:25 PM

## 2017-05-16 ENCOUNTER — Ambulatory Visit (INDEPENDENT_AMBULATORY_CARE_PROVIDER_SITE_OTHER): Payer: 59 | Admitting: Orthopedic Surgery

## 2017-05-16 ENCOUNTER — Encounter (INDEPENDENT_AMBULATORY_CARE_PROVIDER_SITE_OTHER): Payer: Self-pay | Admitting: Orthopedic Surgery

## 2017-05-16 DIAGNOSIS — S42292A Other displaced fracture of upper end of left humerus, initial encounter for closed fracture: Secondary | ICD-10-CM | POA: Diagnosis not present

## 2017-05-16 MED ORDER — IBUPROFEN 600 MG PO TABS: ORAL_TABLET | ORAL | 0 refills | Status: DC

## 2017-05-17 ENCOUNTER — Telehealth: Payer: Self-pay | Admitting: Family Medicine

## 2017-05-20 NOTE — Telephone Encounter (Signed)
IF pt's BP is elevated or leg cramps continues she needs to follow up. Pt can have BMP drawn at her Oncologists

## 2017-05-20 NOTE — Progress Notes (Signed)
Office Visit Note   Patient: Sherry Bender           Date of Birth: 11-Dec-1950           MRN: [id number] Visit Date: 05/16/2017 Requested by: Claretta Fraise, MD [place], [place] [address] PCP: Claretta Fraise, MD  Subjective: Chief Complaint  Patient presents with  . Left Shoulder - Fracture    HPI: Mckenzie is a who injured her left arm 05/14/2017.  Injured after a ground-level fall which was low energy.  She is right-hand dominant.  She has been in a sling.  Takes Tylenol and ibuprofen as needed.  She does have a history of cancer.  She is here with her husband.  She denies any other orthopedic complaints.              ROS: All systems reviewed are negative as they relate to the chief complaint within the history of present illness.  Patient denies  fevers or chills.   Assessment & Plan: Visit Diagnoses:  1. Other closed displaced fracture of proximal end of left humerus, initial encounter     Plan: Impression is minimally displaced left proximal humerus fracture in a patient who is somewhat thin from his cancer treatment.  Fracture does not appear to be pathologic.  The displacement is minimal and she should do well if fracture remains in good position and alignment.  She wants to minimize her trips to [place].  We will prescribe ibuprofen for pain and may have her return in 3 weeks for clinical recheck and repeat radiographs.  Follow-Up Instructions: No Follow-up on file.   Orders:  No orders of the defined types were placed in this encounter.  Meds ordered this encounter  Medications  . ibuprofen (ADVIL,MOTRIN) 600 MG tablet    Sig: 600mg  po bid prn    Dispense:  30 tablet    Refill:  0      Procedures: No procedures performed   Clinical Data: No additional findings.  Objective: Vital Signs: LMP 02/28/1999   Physical Exam:   Constitutional: Patient appears well-developed HEENT:  Head: Normocephalic Eyes:EOM are normal Neck: Normal range of  motion Cardiovascular: Normal rate Pulmonary/chest: Effort normal Neurologic: Patient is alert Skin: Skin is warm Psychiatric: Patient has normal mood and affect    Ortho Exam: Orthopedic exam demonstrates mild swelling left shoulder versus right.  Deltoid fires.  Motor sensory function to the hand is intact.  Radial pulse is intact.  No other masses lymphadenopathy or skin changes noted in the left shoulder girdle region.  Specialty Comments:  No specialty comments available.  Imaging: No results found.   PMFS History: Patient Active Problem List   Diagnosis Date Noted  . Underweight 12/25/2016  . Loss of weight 07/03/2016  . History of esophageal stricture 07/03/2016  . Dysphagia 06/08/2016  . Hypersensitivity reaction 12/02/2015  . Iron deficiency anemia 11/24/2015  . Breast cancer of upper-outer quadrant of left female breast ([place]) 11/11/2014  . S/P cervical spinal fusion 06/24/2014  . HTN (hypertension) 12/06/2010  . Tobacco abuse 12/06/2010  . Hyperlipidemia 09/26/2010  . Nodular goiter 09/26/2010  . GERD (gastroesophageal reflux disease) 09/26/2010  . Degenerative disc disease 09/26/2010  . Allergic rhinitis 09/26/2010  . Anxiety disorder 09/26/2010  . Cyst, breast 09/26/2010   Past Medical History:  Diagnosis Date  . Anxiety   . Arthritis    IN BACK....  . Breast cancer ([place]) 2016  . Cancer [facility])    SKIN  CANCERS ON FACE  . Complication of anesthesia    "HARD TO WAKE UP".......Marland KitchenN/V  . GERD (gastroesophageal reflux disease)   . Hyperlipidemia   . Hypertension   . PONV (postoperative nausea and vomiting)   . Scoliosis   . Urinary frequency     Family History  Problem Relation Age of Onset  . Heart disease Mother   . Hypertension Mother   . Diabetes Mother   . Heart disease Father   . Hypertension Father   . Cancer Maternal Grandmother        ? ovarian cancer vs. cervical  . Cancer Paternal Aunt        ? breast    Past Surgical History:    Procedure Laterality Date  . ABDOMINAL HYSTERECTOMY  2000   TAH/BSO  . ANTERIOR CERVICAL DECOMP/DISCECTOMY FUSION N/A 06/18/2014   Procedure: C4-5 C5-6 C6-7 ANTERIOR CERVICAL DECOMPRESSION/DISKECTOMY/FUSION;  Surgeon: Eustace Moore, MD;  Location: [facility];  Service: Neurosurgery;  Laterality: N/A;  C4-5 C5-6 C6-7 ANTERIOR CERVICAL DECOMPRESSION/DISKECTOMY/FUSION  . ANTERIOR CERVICAL DECOMP/DISCECTOMY FUSION N/A 06/25/2014   Procedure: EXPLORATION OF CERVICAL FUSION WITH REVIOSN OF HARDWARE;  Surgeon: Eustace Moore, MD;  Location: [facility];  Service: Neurosurgery;  Laterality: N/A;  . CERVICAL FUSION    . CHOLECYSTECTOMY  1994  . COMPLETE MASTECTOMY W/ SENTINEL NODE BIOPSY Left 04/30/2015  . FOOT NEUROMA SURGERY  01/2010   also had metatarasl shortened  . KNEE ARTHROSCOPY  2005   right knee  . PORT-A-CATH REMOVAL Right 04/30/2015   Procedure: REMOVAL PORT-A-CATH;  Surgeon: Fanny Skates, MD;  Location: [facility];  Service: General;  Laterality: Right;  . PORTACATH PLACEMENT N/A 11/23/2014   Procedure: INSERTION PORT-A-CATH WITH ULTRASOUND;  Surgeon: Fanny Skates, MD;  Location: [facility];  Service: General;  Laterality: N/A;  . RHINOPLASTY    . SIMPLE MASTECTOMY WITH AXILLARY SENTINEL NODE BIOPSY Left 04/30/2015   Procedure: LEFT TOTAL  MASTECTOMY WITH LEFT AXILLARY SENTINEL NODE BIOPSY;  Surgeon: Fanny Skates, MD;  Location: [facility];  Service: General;  Laterality: Left;   Social History   Occupational History  . Not on file  Tobacco Use  . Smoking status: Former Smoker    Packs/day: 0.25    Years: 42.00    Pack years: 10.50    Types: Cigarettes    Last attempt to quit: 01/24/2017    Years since quitting: 0.3  . Smokeless tobacco: Never Used  Substance and Sexual Activity  . Alcohol use: No  . Drug use: No  . Sexual activity: No

## 2017-05-22 ENCOUNTER — Ambulatory Visit: Payer: 59 | Admitting: Family

## 2017-05-29 ENCOUNTER — Telehealth (INDEPENDENT_AMBULATORY_CARE_PROVIDER_SITE_OTHER): Payer: Self-pay

## 2017-05-29 MED ORDER — IBUPROFEN 600 MG PO TABS: ORAL_TABLET | ORAL | 0 refills | Status: DC

## 2017-05-29 NOTE — Telephone Encounter (Signed)
done

## 2017-05-29 NOTE — Telephone Encounter (Signed)
Patient pharmacy request 90 day supply for patient of ibuprofen 600mg  1 po bid prn #90 with no refill. Ok to fill? [facility].

## 2017-05-29 NOTE — Telephone Encounter (Signed)
y

## 2017-05-29 NOTE — Addendum Note (Signed)
Addended byLaurann Montana on: 05/29/2017 03:24 PM   Modules accepted: Orders

## 2017-06-01 ENCOUNTER — Encounter (INDEPENDENT_AMBULATORY_CARE_PROVIDER_SITE_OTHER): Payer: Self-pay | Admitting: Orthopedic Surgery

## 2017-06-01 ENCOUNTER — Ambulatory Visit (INDEPENDENT_AMBULATORY_CARE_PROVIDER_SITE_OTHER): Payer: 59 | Admitting: Orthopedic Surgery

## 2017-06-01 ENCOUNTER — Ambulatory Visit (INDEPENDENT_AMBULATORY_CARE_PROVIDER_SITE_OTHER): Payer: 59

## 2017-06-01 DIAGNOSIS — S42292D Other displaced fracture of upper end of left humerus, subsequent encounter for fracture with routine healing: Secondary | ICD-10-CM

## 2017-06-01 DIAGNOSIS — S42292A Other displaced fracture of upper end of left humerus, initial encounter for closed fracture: Secondary | ICD-10-CM | POA: Insufficient documentation

## 2017-06-02 NOTE — Progress Notes (Signed)
Post-Op Visit Note   Patient: Sherry Bender           Date of Birth: 09/13/1950           MRN: [id number] Visit Date: 06/01/2017 PCP: Sherry Fraise, MD   Assessment & Plan:  Chief Complaint:  Chief Complaint  Patient presents with  . Left Shoulder - Follow-up   Visit Diagnoses:  1. Closed fracture of head of left humerus with routine healing, subsequent encounter     Plan: [name] is now 3 weeks out left proximal humerus fracture.  She has been in a sling but has been doing some pendulums.  She states she is "getting there" on exam the fracture moves as a unit.  Predictable stiffness is present.  Deltoid fires.  Radiographs look unchanged.  The plan is to discontinue the sling in 3 days and do not do any lifting more than 1 pound with the left arm.  Continue with home health physical therapy.  Return in 4 weeks for AP outlet and axillary lateral radiographs.  Follow-Up Instructions: Return in about 4 weeks (around 06/29/2017).   Orders:  Orders Placed This Encounter  Procedures  . XR Shoulder Left   No orders of the defined types were placed in this encounter.   Imaging: Xr Shoulder Left  Result Date: 06/02/2017 Proximal humeral fracture with some apex anterior angulation is present but unchanged from previous x-rays.  Shoulder is located.  Some bone demineralization is present   PMFS History: Patient Active Problem List   Diagnosis Date Noted  . Closed fracture of head of left humerus 06/01/2017  . Underweight 12/25/2016  . Loss of weight 07/03/2016  . History of esophageal stricture 07/03/2016  . Dysphagia 06/08/2016  . Hypersensitivity reaction 12/02/2015  . Iron deficiency anemia 11/24/2015  . Breast cancer of upper-outer quadrant of left female breast ([place]) 11/11/2014  . S/P cervical spinal fusion 06/24/2014  . HTN (hypertension) 12/06/2010  . Tobacco abuse 12/06/2010  . Hyperlipidemia 09/26/2010  . Nodular goiter 09/26/2010  . GERD (gastroesophageal  reflux disease) 09/26/2010  . Degenerative disc disease 09/26/2010  . Allergic rhinitis 09/26/2010  . Anxiety disorder 09/26/2010  . Cyst, breast 09/26/2010   Past Medical History:  Diagnosis Date  . Anxiety   . Arthritis    IN BACK....  . Breast cancer ([place]) 2016  . 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 Complication of anesthesia    "HARD TO WAKE UP".......Marland KitchenN/V  . GERD (gastroesophageal reflux disease)   . Hyperlipidemia   . Hypertension   . PONV (postoperative nausea and vomiting)   . Scoliosis   . Urinary frequency     Family History  Problem Relation Age of Onset  . Heart disease Mother   . Hypertension Mother   . Diabetes Mother   . Heart disease Father   . Hypertension Father   . Cancer Maternal Grandmother        ? ovarian cancer vs. cervical  . Cancer Paternal Aunt        ? breast    Past Surgical History:  Procedure Laterality Date  . ABDOMINAL HYSTERECTOMY  2000   TAH/BSO  . ANTERIOR CERVICAL DECOMP/DISCECTOMY FUSION N/A 06/18/2014   Procedure: C4-5 C5-6 C6-7 ANTERIOR CERVICAL DECOMPRESSION/DISKECTOMY/FUSION;  Surgeon: Eustace Moore, MD;  Location: [place] [facility];  Service: Neurosurgery;  Laterality: N/A;  C4-5 C5-6 C6-7 ANTERIOR CERVICAL DECOMPRESSION/DISKECTOMY/FUSION  . ANTERIOR CERVICAL DECOMP/DISCECTOMY FUSION N/A 06/25/2014   Procedure: EXPLORATION OF CERVICAL  FUSION WITH REVIOSN OF HARDWARE;  Surgeon: Eustace Moore, MD;  Location: [facility];  Service: Neurosurgery;  Laterality: N/A;  . CERVICAL FUSION    . CHOLECYSTECTOMY  1994  . COMPLETE MASTECTOMY W/ SENTINEL NODE BIOPSY Left 04/30/2015  . FOOT NEUROMA SURGERY  01/2010   also had metatarasl shortened  . KNEE ARTHROSCOPY  2005   right knee  . PORT-A-CATH REMOVAL Right 04/30/2015   Procedure: REMOVAL PORT-A-CATH;  Surgeon: Fanny Skates, MD;  Location: [facility];  Service: General;  Laterality: Right;  . PORTACATH PLACEMENT N/A 11/23/2014   Procedure: INSERTION PORT-A-CATH WITH ULTRASOUND;  Surgeon:  Fanny Skates, MD;  Location: [facility];  Service: General;  Laterality: N/A;  . RHINOPLASTY    . SIMPLE MASTECTOMY WITH AXILLARY SENTINEL NODE BIOPSY Left 04/30/2015   Procedure: LEFT TOTAL  MASTECTOMY WITH LEFT AXILLARY SENTINEL NODE BIOPSY;  Surgeon: Fanny Skates, MD;  Location: [facility];  Service: General;  Laterality: Left;   Social History   Occupational History  . Not on file  Tobacco Use  . Smoking status: Former Smoker    Packs/day: 0.25    Years: 42.00    Pack years: 10.50    Types: Cigarettes    Last attempt to quit: 01/24/2017    Years since quitting: 0.3  . Smokeless tobacco: Never Used  Substance and Sexual Activity  . Alcohol use: No  . Drug use: No  . Sexual activity: No

## 2017-06-05 MED FILL — DEXILANT DR 60 MG CAPSULE: 60 | 30 days supply | Qty: 30 | Fill #7

## 2017-06-06 ENCOUNTER — Ambulatory Visit (INDEPENDENT_AMBULATORY_CARE_PROVIDER_SITE_OTHER): Payer: 59

## 2017-06-06 DIAGNOSIS — R2681 Unsteadiness on feet: Secondary | ICD-10-CM

## 2017-06-06 DIAGNOSIS — Z87891 Personal history of nicotine dependence: Secondary | ICD-10-CM | POA: Diagnosis not present

## 2017-06-06 DIAGNOSIS — K219 Gastro-esophageal reflux disease without esophagitis: Secondary | ICD-10-CM | POA: Diagnosis not present

## 2017-06-06 DIAGNOSIS — S42202D Unspecified fracture of upper end of left humerus, subsequent encounter for fracture with routine healing: Secondary | ICD-10-CM | POA: Diagnosis not present

## 2017-06-06 DIAGNOSIS — G8911 Acute pain due to trauma: Secondary | ICD-10-CM | POA: Diagnosis not present

## 2017-06-06 DIAGNOSIS — E785 Hyperlipidemia, unspecified: Secondary | ICD-10-CM

## 2017-06-06 DIAGNOSIS — Z853 Personal history of malignant neoplasm of breast: Secondary | ICD-10-CM | POA: Diagnosis not present

## 2017-06-06 DIAGNOSIS — I10 Essential (primary) hypertension: Secondary | ICD-10-CM

## 2017-06-06 DIAGNOSIS — F419 Anxiety disorder, unspecified: Secondary | ICD-10-CM | POA: Diagnosis not present

## 2017-06-28 ENCOUNTER — Ambulatory Visit (INDEPENDENT_AMBULATORY_CARE_PROVIDER_SITE_OTHER): Payer: 59

## 2017-06-28 ENCOUNTER — Ambulatory Visit (INDEPENDENT_AMBULATORY_CARE_PROVIDER_SITE_OTHER): Payer: 59 | Admitting: Orthopedic Surgery

## 2017-06-28 ENCOUNTER — Encounter (INDEPENDENT_AMBULATORY_CARE_PROVIDER_SITE_OTHER): Payer: Self-pay | Admitting: Orthopedic Surgery

## 2017-06-28 DIAGNOSIS — S42292D Other displaced fracture of upper end of left humerus, subsequent encounter for fracture with routine healing: Secondary | ICD-10-CM

## 2017-06-29 ENCOUNTER — Encounter (INDEPENDENT_AMBULATORY_CARE_PROVIDER_SITE_OTHER): Payer: Self-pay | Admitting: Orthopedic Surgery

## 2017-06-29 NOTE — Progress Notes (Signed)
Post-Op Visit Note   Patient: Sherry Bender           Date of Birth: [birth year]           MRN: [id number] Visit Date: 06/28/2017 PCP: Claretta Fraise, MD   Assessment & Plan:  Chief Complaint:  Chief Complaint  Patient presents with  . Left Shoulder - Follow-up   Visit Diagnoses:  1. Closed fracture of head of left humerus with routine healing, subsequent encounter     Plan: Sherry Bender is a patient who is now about a month out from left proximal humerus fracture.  Well.  She is at home health physical therapy 2 times a week.  Taking ibuprofen for pain.  Radiographs show callus formation and some mild anterior angulation of the fracture.  On exam she has improving forward flexion and abduction to about 90 degrees.  I think this will continue to improve.  If she wants to do her own physical therapy.  Range of motion and activity as tolerated is indicated except for any lifting more than 5 pounds for the next 2 weeks.  I offered to set her up with outpatient therapy but she wants to hold off on that intervention.  She and her husband will try to get the shoulder moving on her own.  Did tell her that this is going to be a prolonged process to get shoulder range of motion back.  Follow-up with me as needed.  Follow-Up Instructions: Return if symptoms worsen or fail to improve.   Orders:  Orders Placed This Encounter  Procedures  . XR Shoulder Left   No orders of the defined types were placed in this encounter.   Imaging: No results found.  PMFS History: Patient Active Problem List   Diagnosis Date Noted  . Closed fracture of head of left humerus 06/01/2017  . Underweight 12/25/2016  . Loss of weight 07/03/2016  . History of esophageal stricture 07/03/2016  . Dysphagia 06/08/2016  . Hypersensitivity reaction 12/02/2015  . Iron deficiency anemia 11/24/2015  . Breast cancer of upper-outer quadrant of left female breast ([place]) 11/11/2014  . S/P cervical spinal fusion 06/24/2014  .  HTN (hypertension) 12/06/2010  . Tobacco abuse 12/06/2010  . Hyperlipidemia 09/26/2010  . Nodular goiter 09/26/2010  . GERD (gastroesophageal reflux disease) 09/26/2010  . Degenerative disc disease 09/26/2010  . Allergic rhinitis 09/26/2010  . Anxiety disorder 09/26/2010  . Cyst, breast 09/26/2010   Past Medical History:  Diagnosis Date  . Anxiety   . Arthritis    IN BACK....  . Breast cancer ([place]) 2016  . 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 Complication of anesthesia    "HARD TO WAKE UP".......Marland KitchenN/V  . GERD (gastroesophageal reflux disease)   . Hyperlipidemia   . Hypertension   . PONV (postoperative nausea and vomiting)   . Scoliosis   . Urinary frequency     Family History  Problem Relation Age of Onset  . Heart disease Mother   . Hypertension Mother   . Diabetes Mother   . Heart disease Father   . Hypertension Father   . Cancer Maternal Grandmother        ? ovarian cancer vs. cervical  . Cancer Paternal Aunt        ? breast    Past Surgical History:  Procedure Laterality Date  . ABDOMINAL HYSTERECTOMY  2000   TAH/BSO  . ANTERIOR CERVICAL DECOMP/DISCECTOMY FUSION N/A 06/18/2014   Procedure: C4-5 C5-6 C6-7 ANTERIOR  CERVICAL DECOMPRESSION/DISKECTOMY/FUSION;  Surgeon: Eustace Moore, MD;  Location: [facility] [facility];  Service: Neurosurgery;  Laterality: N/A;  C4-5 C5-6 C6-7 ANTERIOR CERVICAL DECOMPRESSION/DISKECTOMY/FUSION  . ANTERIOR CERVICAL DECOMP/DISCECTOMY FUSION N/A 06/25/2014   Procedure: EXPLORATION OF CERVICAL FUSION WITH REVIOSN OF HARDWARE;  Surgeon: Eustace Moore, MD;  Location: [facility];  Service: Neurosurgery;  Laterality: N/A;  . CERVICAL FUSION    . CHOLECYSTECTOMY  1994  . COMPLETE MASTECTOMY W/ SENTINEL NODE BIOPSY Left 04/30/2015  . FOOT NEUROMA SURGERY  01/2010   also had metatarasl shortened  . KNEE ARTHROSCOPY  2005   right knee  . PORT-A-CATH REMOVAL Right 04/30/2015   Procedure: REMOVAL PORT-A-CATH;  Surgeon: Fanny Skates, MD;  Location:  [facility];  Service: General;  Laterality: Right;  . PORTACATH PLACEMENT N/A 11/23/2014   Procedure: INSERTION PORT-A-CATH WITH ULTRASOUND;  Surgeon: Fanny Skates, MD;  Location: [facility];  Service: General;  Laterality: N/A;  . RHINOPLASTY    . SIMPLE MASTECTOMY WITH AXILLARY SENTINEL NODE BIOPSY Left 04/30/2015   Procedure: LEFT TOTAL  MASTECTOMY WITH LEFT AXILLARY SENTINEL NODE BIOPSY;  Surgeon: Fanny Skates, MD;  Location: [facility];  Service: General;  Laterality: Left;   Social History   Occupational History  . Not on file  Tobacco Use  . Smoking status: Former Smoker    Packs/day: 0.25    Years: 42.00    Pack years: 10.50    Types: Cigarettes    Last attempt to quit: 01/24/2017    Years since quitting: 0.4  . Smokeless tobacco: Never Used  Substance and Sexual Activity  . Alcohol use: No  . Drug use: No  . Sexual activity: No

## 2017-07-08 ENCOUNTER — Other Ambulatory Visit: Payer: Self-pay | Admitting: Family

## 2017-07-08 DIAGNOSIS — R252 Cramp and spasm: Secondary | ICD-10-CM

## 2017-07-10 MED FILL — DEXILANT DR 60 MG CAPSULE: 60 | 30 days supply | Qty: 30 | Fill #8

## 2017-07-31 ENCOUNTER — Other Ambulatory Visit: Payer: Self-pay | Admitting: Family

## 2017-07-31 DIAGNOSIS — N393 Stress incontinence (female) (male): Secondary | ICD-10-CM

## 2017-07-31 DIAGNOSIS — I10 Essential (primary) hypertension: Secondary | ICD-10-CM

## 2017-08-10 MED FILL — DEXILANT DR 60 MG CAPSULE: 60 | 30 days supply | Qty: 30 | Fill #9

## 2017-09-03 ENCOUNTER — Encounter: Payer: Self-pay | Admitting: Family

## 2017-09-03 ENCOUNTER — Ambulatory Visit: Payer: 59 | Admitting: Family

## 2017-09-03 VITALS — BP 148/89 | HR 110 | Temp 97.4°F | Ht 64.0 in | Wt 98.4 lb

## 2017-09-03 DIAGNOSIS — I10 Essential (primary) hypertension: Secondary | ICD-10-CM

## 2017-09-03 DIAGNOSIS — R636 Underweight: Secondary | ICD-10-CM

## 2017-09-03 DIAGNOSIS — Z1159 Encounter for screening for other viral diseases: Secondary | ICD-10-CM

## 2017-09-03 DIAGNOSIS — D509 Iron deficiency anemia, unspecified: Secondary | ICD-10-CM

## 2017-09-03 DIAGNOSIS — Z0000 Encounter for general adult medical examination without abnormal findings: Secondary | ICD-10-CM | POA: Diagnosis not present

## 2017-09-03 DIAGNOSIS — K219 Gastro-esophageal reflux disease without esophagitis: Secondary | ICD-10-CM

## 2017-09-03 NOTE — Patient Instructions (Signed)
Leg Cramps Leg cramps occur when a muscle or muscles tighten and you have no control over this tightening (involuntary muscle contraction). Muscle cramps can develop in any muscle, but the most common place is in the calf muscles of the leg. Those cramps can occur during exercise or when you are at rest. Leg cramps are painful, and they may last for a few seconds to a few minutes. Cramps may return several times before they finally stop. Usually, leg cramps are not caused by a serious medical problem. In many cases, the cause is not known. Some common causes include:  Overexertion.  Overuse from repetitive motions, or doing the same thing over and over.  Remaining in a certain position for a long period of time.  Improper preparation, form, or technique while performing a sport or an activity.  Dehydration.  Injury.  Side effects of some medicines.  Abnormally low levels of the salts and ions in your blood (electrolytes), especially potassium and calcium. These levels could be low if you are taking water pills (diuretics) or if you are pregnant.  Follow these instructions at home: Watch your condition for any changes. Taking the following actions may help to lessen any discomfort that you are feeling:  Stay well-hydrated. Drink enough fluid to keep your urine clear or pale yellow.  Try massaging, stretching, and relaxing the affected muscle. Do this for several minutes at a time.  For tight or tense muscles, use a warm towel, heating pad, or hot shower water directed to the affected area.  If you are sore or have pain after a cramp, applying ice to the affected area may relieve discomfort. ? Put ice in a plastic bag. ? Place a towel between your skin and the bag. ? Leave the ice on for 20 minutes, 2-3 times per day.  Avoid strenuous exercise for several days if you have been having frequent leg cramps.  Make sure that your diet includes the essential minerals for your muscles to  work normally.  Take medicines only as directed by your health care provider.  Contact a health care provider if:  Your leg cramps get more severe or more frequent, or they do not improve over time.  Your foot becomes cold, numb, or blue. This information is not intended to replace advice given to you by your health care provider. Make sure you discuss any questions you have with your health care provider. Document Released: 07/13/2004 Document Revised: 11/11/2015 Document Reviewed: 05/13/2014 Elsevier Interactive Patient Education  2018 [employer]  

## 2017-09-03 NOTE — Progress Notes (Signed)
Subjective:    Patient ID: Sherry Bender, female    DOB: March 07, 1951, 67 y.o.   MRN: [id number]  Pt presents to the office today for CPE and chronic follow up. Pt is followed by Oncologists annually for hx Breast cancer.  Hypertension  This is a chronic problem. The current episode started more than 1 year ago. The problem has been waxing and waning since onset. The problem is uncontrolled. Associated symptoms include malaise/fatigue. Pertinent negatives include no headaches, peripheral edema or shortness of breath. Risk factors for coronary artery disease include dyslipidemia. The current treatment provides no improvement. There is no history of kidney disease, CAD/MI, CVA or heart failure.  Gastroesophageal Reflux  She complains of heartburn. She reports no belching or no dysphagia. This is a chronic problem. The current episode started more than 1 year ago. The problem occurs frequently. The problem has been waxing and waning. She has tried a PPI for the symptoms. The treatment provided moderate relief.  Constipation  This is a chronic problem. The current episode started more than 1 month ago. The problem has been waxing and waning since onset. The treatment provided mild relief.  Anemia  Presents for follow-up visit. Symptoms include malaise/fatigue. There has been no paresthesias. There is no history of heart failure.  Leg Cramps Takes Flexeril at night. Continues to bilateral leg cramps of 8-9 out 10.     Review of Systems  Constitutional: Positive for malaise/fatigue.  Respiratory: Negative for shortness of breath.   Gastrointestinal: Positive for constipation and heartburn. Negative for dysphagia.  Neurological: Negative for headaches and paresthesias.  All other systems reviewed and are negative.      Objective:   Physical Exam  Constitutional: She is oriented to person, place, and time. She appears well-developed and well-nourished. No distress.  HENT:  Head: Normocephalic  and atraumatic.  Right Ear: External ear normal.  Left Ear: External ear normal.  Nose: Nose normal.  Mouth/Throat: Oropharynx is clear and moist.  Eyes: Pupils are equal, round, and reactive to light.  Neck: Normal range of motion. Neck supple. No thyromegaly present.  Cardiovascular: Normal rate, regular rhythm, normal heart sounds and intact distal pulses.  No murmur heard. Pulmonary/Chest: Effort normal and breath sounds normal. No respiratory distress. She has no wheezes.  Abdominal: Soft. Bowel sounds are normal. She exhibits no distension. There is no tenderness.  Musculoskeletal: Normal range of motion. She exhibits no edema or tenderness.  Neurological: She is alert and oriented to person, place, and time.  Skin: Skin is warm and dry.  Psychiatric: She has a normal mood and affect. Her behavior is normal. Judgment and thought content normal.  Vitals reviewed.     BP (!) 148/89   Pulse (!) 110   Temp (!) 97.4 F (36.3 C) (Oral)   Ht _0  (1.626 m)   Wt 98 lb 6.4 oz (44.6 kg)   LMP 02/28/1999   BMI 16.89 kg/m      Assessment & Plan:  1. Essential hypertension -Will hold losartan since she has not had BP meds in last several weeks related to dizziness  - CMP14+EGFR  2. Gastroesophageal reflux disease, esophagitis presence not specified - CMP14+EGFR  3. Underweight - CMP14+EGFR  4. Iron deficiency anemia, unspecified iron deficiency anemia type - Anemia Profile B - CMP14+EGFR  5. Annual physical exam - Anemia Profile B - CMP14+EGFR - Lipid panel - TSH - VITAMIN D 25 Hydroxy (Vit-D Deficiency, Fractures) - Hepatitis C antibody  6. Encounter  for hepatitis C screening test for low risk patient - CMP14+EGFR - Hepatitis C antibody   Continue all meds Labs pending Health Maintenance reviewed Diet and exercise encouraged RTO 1 month to recheck HTN  Evelina Dun, FNP

## 2017-09-04 ENCOUNTER — Other Ambulatory Visit: Payer: Self-pay | Admitting: Family

## 2017-09-04 LAB — ANEMIA PROFILE B
Basophils Absolute: 0 10*3/uL (ref 0.0–0.2)
Basos: 0 %
EOS (ABSOLUTE): 0.2 10*3/uL (ref 0.0–0.4)
Eos: 4 %
Ferritin: 79 ng/mL (ref 15–150)
Folate: 10.1 ng/mL (ref >3.0)
Hematocrit: 39.8 % (ref 34.0–46.6)
Hemoglobin: 13.6 g/dL (ref 11.1–15.9)
Immature Grans (Abs): 0 10*3/uL (ref 0.0–0.1)
Immature Granulocytes: 0 %
Iron Saturation: 31 % (ref 15–55)
Iron: 83 ug/dL (ref 27–139)
Lymphocytes Absolute: 0.9 10*3/uL (ref 0.7–3.1)
Lymphs: 17 %
MCH: 31.6 pg (ref 26.6–33.0)
MCHC: 34.2 g/dL (ref 31.5–35.7)
MCV: 93 fL (ref 79–97)
Monocytes Absolute: 0.5 10*3/uL (ref 0.1–0.9)
Monocytes: 10 %
Neutrophils Absolute: 3.6 10*3/uL (ref 1.4–7.0)
Neutrophils: 69 %
Platelets: 187 10*3/uL (ref 150–379)
RBC: 4.3 x10E6/uL (ref 3.77–5.28)
RDW: 13.1 % (ref 12.3–15.4)
Retic Ct Pct: 1.1 % (ref 0.6–2.6)
Total Iron Binding Capacity: 269 ug/dL (ref 250–450)
UIBC: 186 ug/dL (ref 118–369)
Vitamin B-12: 612 pg/mL (ref 232–1245)
WBC: 5.3 10*3/uL (ref 3.4–10.8)

## 2017-09-04 LAB — CMP14+EGFR
ALT: 19 IU/L (ref 0–32)
AST: 23 IU/L (ref 0–40)
Albumin/Globulin Ratio: 1.9 (ref 1.2–2.2)
Albumin: 4.7 g/dL (ref 3.6–4.8)
Alkaline Phosphatase: 60 IU/L (ref 39–117)
BUN/Creatinine Ratio: 34 — ABNORMAL HIGH (ref 12–28)
BUN: 11 mg/dL (ref 8–27)
Bilirubin Total: 0.4 mg/dL (ref 0.0–1.2)
CO2: 26 mmol/L (ref 20–29)
Calcium: 9.8 mg/dL (ref 8.7–10.3)
Chloride: 93 mmol/L — ABNORMAL LOW (ref 96–106)
Creatinine, Ser: 0.32 mg/dL — ABNORMAL LOW (ref 0.57–1.00)
GFR calc Af Amer: 135 mL/min/{1.73_m2} (ref >59)
GFR calc non Af Amer: 117 mL/min/{1.73_m2} (ref >59)
Globulin, Total: 2.5 g/dL (ref 1.5–4.5)
Glucose: 74 mg/dL (ref 65–99)
Potassium: 4.2 mmol/L (ref 3.5–5.2)
Sodium: 136 mmol/L (ref 134–144)
Total Protein: 7.2 g/dL (ref 6.0–8.5)

## 2017-09-04 LAB — LIPID PANEL
Chol/HDL Ratio: 2.3 ratio (ref 0.0–4.4)
Cholesterol, Total: 228 mg/dL — ABNORMAL HIGH (ref 100–199)
HDL: 99 mg/dL (ref >39)
LDL Calculated: 112 mg/dL — ABNORMAL HIGH (ref 0–99)
Triglycerides: 86 mg/dL (ref 0–149)
VLDL Cholesterol Cal: 17 mg/dL (ref 5–40)

## 2017-09-04 LAB — HEPATITIS C ANTIBODY: Hep C Virus Ab: <0.1 s/co ratio (ref 0.0–0.9)

## 2017-09-04 LAB — TSH: TSH: 1.02 u[IU]/mL (ref 0.450–4.500)

## 2017-09-04 LAB — VITAMIN D 25 HYDROXY (VIT D DEFICIENCY, FRACTURES): Vit D, 25-Hydroxy: 23.5 ng/mL — ABNORMAL LOW (ref 30.0–100.0)

## 2017-09-04 MED ORDER — VITAMIN D (ERGOCALCIFEROL) 1.25 MG (50000 UNIT) PO CAPS: 50000.0000 [IU] | ORAL_CAPSULE | ORAL | 3 refills | Status: DC

## 2017-09-11 MED FILL — DEXILANT DR 60 MG CAPSULE: 60 | 30 days supply | Qty: 30 | Fill #10

## 2017-10-03 ENCOUNTER — Telehealth: Payer: Self-pay | Admitting: Family

## 2017-10-03 NOTE — Telephone Encounter (Signed)
Vitamin D does not usually cause side effects, she is feeling that way than just have her stop it and come back to see Alyse Low if those symptoms persist.  Have her increase her dairy intake

## 2017-10-03 NOTE — Telephone Encounter (Signed)
Please advise on side effects of vitamin D??

## 2017-10-03 NOTE — Telephone Encounter (Signed)
Aware. 

## 2017-10-05 MED FILL — DEXILANT DR 60 MG CAPSULE: 60 | 30 days supply | Qty: 30 | Fill #11

## 2017-10-15 ENCOUNTER — Telehealth: Payer: Self-pay | Admitting: Hematology and Oncology

## 2017-10-15 NOTE — Telephone Encounter (Signed)
Called pt re appt being moved due to call day - spoke with pt re appts.

## 2017-10-25 ENCOUNTER — Ambulatory Visit: Payer: 59 | Admitting: Hematology and Oncology

## 2017-10-26 ENCOUNTER — Inpatient Hospital Stay: Payer: Medicare Other | Attending: Hematology and Oncology | Admitting: Hematology and Oncology

## 2017-10-26 ENCOUNTER — Telehealth: Payer: Self-pay | Admitting: Hematology and Oncology

## 2017-10-26 DIAGNOSIS — G620 Drug-induced polyneuropathy: Secondary | ICD-10-CM | POA: Insufficient documentation

## 2017-10-26 DIAGNOSIS — Z79899 Other long term (current) drug therapy: Secondary | ICD-10-CM | POA: Diagnosis not present

## 2017-10-26 DIAGNOSIS — C50412 Malignant neoplasm of upper-outer quadrant of left female breast: Secondary | ICD-10-CM

## 2017-10-26 DIAGNOSIS — Z9012 Acquired absence of left breast and nipple: Secondary | ICD-10-CM | POA: Diagnosis not present

## 2017-10-26 DIAGNOSIS — T451X5S Adverse effect of antineoplastic and immunosuppressive drugs, sequela: Secondary | ICD-10-CM | POA: Insufficient documentation

## 2017-10-26 DIAGNOSIS — I10 Essential (primary) hypertension: Secondary | ICD-10-CM | POA: Diagnosis not present

## 2017-10-26 DIAGNOSIS — Z923 Personal history of irradiation: Secondary | ICD-10-CM | POA: Insufficient documentation

## 2017-10-26 DIAGNOSIS — R531 Weakness: Secondary | ICD-10-CM | POA: Diagnosis not present

## 2017-10-26 DIAGNOSIS — R42 Dizziness and giddiness: Secondary | ICD-10-CM | POA: Insufficient documentation

## 2017-10-26 DIAGNOSIS — K219 Gastro-esophageal reflux disease without esophagitis: Secondary | ICD-10-CM | POA: Diagnosis not present

## 2017-10-26 DIAGNOSIS — Z9221 Personal history of antineoplastic chemotherapy: Secondary | ICD-10-CM | POA: Insufficient documentation

## 2017-10-26 DIAGNOSIS — D509 Iron deficiency anemia, unspecified: Secondary | ICD-10-CM | POA: Diagnosis not present

## 2017-10-26 MED ORDER — DEXILANT 60 MG PO CPDR: 60.0000 mg | DELAYED_RELEASE_CAPSULE | Freq: Every day | ORAL | 11 refills | Status: DC

## 2017-10-26 NOTE — Assessment & Plan Note (Signed)
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 IV iron given 12/02/2015 2 doses Response to IV iron therapy:Improvement in hemoglobin from 10.6-13.8 Today's lab review: 10/26/2017 -------------------------------------------------------------------------------------------------------------------------------------------------------- Hypertension:Under her PCP care. Weight loss: No major improvement.  GERD: On Dexilant.  Return to clinic in 1 yearwith lab count check and follow-up.

## 2017-10-26 NOTE — Progress Notes (Signed)
Patient Care Team: Sharion Balloon, FNP as PCP - General (Family Medicine) Dr Geanie Berlin (Gynecology) Holley Bouche, NP as Nurse Practitioner (Nurse Practitioner) Nicholas Lose, MD as Consulting Physician (Hematology and Oncology) Fanny Skates, MD as Consulting Physician (General Surgery) Kyung Rudd, MD as Consulting Physician (Radiation Oncology) Sylvan Cheese, NP as Nurse Practitioner (Hematology and Oncology)  DIAGNOSIS:  Encounter Diagnosis  Name Primary?  . Malignant neoplasm of upper-outer quadrant of left female breast, unspecified estrogen receptor status ([place])     SUMMARY OF ONCOLOGIC HISTORY:   Breast cancer of upper-outer quadrant of left female breast ([place])   11/09/2014 Mammogram    Left breast irregular hypoechoic mass 12:00 position 5.3 x 3.7 x 2.9 cm, left low axilla normal sized lymph nodes      11/09/2014 Initial Diagnosis    Left Bx: IDC with DCIS; Grade 3, ER - (0%), PR- (0%), Her 2 Neg, Ki 67: 70%      11/26/2014 Breast MRI    3.5 x 4.5 x 3.8 cm biopsy-proven malignancy within the upper/ upper inner left breast. Scattered foci adjacent to the biopsy-proven malignancy and within the upper left breast are indeterminate.      11/26/2014 Clinical Stage    Stage IIB: T3 N0      11/30/2014 -  Neo-Adjuvant Chemotherapy    Dose dense Adriamycin and Cytoxan 4 followed by Abraxane weekly 12      04/05/2015 Breast MRI    Breast complete imaging response to neoadjuvant chemotherapy      04/30/2015 Surgery    Left mastectomy: IDC 2 mm residual disease, 1 lymph node positive for isolated tumor cells within normal lymphatics. 0/3 Lymph Nodes      04/30/2015 Pathologic Stage    Stage IA: pT1a pN0      06/28/2015 - 08/12/2015 Radiation Therapy    Adjuvant radiation therapy: Left chest wall and supraclavicular fossa 50.4 Gy over 28 fractions; left chest wall boost 10 Gy over 5 fractions. Total dose 60.4 Gy      12/02/2015 Survivorship   Survivorship care plan provided to patient       CHIEF COMPLIANT: Follow-up on surveillance of breast cancer as well as iron deficiency anemia  INTERVAL HISTORY: Sherry Bender is a 67 year old with above-mentioned history left breast cancer treated with neoadjuvant chemotherapy followed by mastectomy and is currently on observation.  She also received adjuvant radiation therapy.  She was diagnosed with iron deficiency anemia and she received IV iron therapy with remarkable improvement in hemoglobin.  This was given June 2017.  She has not required any additional IV iron since that time.  She is here for annual follow-up.  She denies any lumps or nodules in the breast. She had fallen at her house and had to come to the emergency room.  She broke her left shoulder and had to be in a sling for a while.  She still has limitation in the range of motion of the left shoulder.  She does feel dizzy and weak.  She has been on multiple blood pressure medicines but they have been removed because of her dizziness complaints.  She is working with her primary care physician on her blood pressure issues.  She has been trying to gain some weight but has been completely unsuccessful.  Her husband is bringing her protein shakes and drinks but she has not been able to gain any weight.  REVIEW OF SYSTEMS:   Constitutional: Denies fevers, chills or abnormal weight loss Eyes:  Denies blurriness of vision Ears, nose, mouth, throat, and face: Denies mucositis or sore throat Respiratory: Denies cough, dyspnea or wheezes Cardiovascular: Denies palpitation, chest discomfort Gastrointestinal:  Denies nausea, heartburn or change in bowel habits Skin: Denies abnormal skin rashes Lymphatics: Denies new lymphadenopathy or easy bruising Neurological:Denies numbness, tingling or new weaknesses Behavioral/Psych: Mood is stable, no new changes  Extremities: No lower extremity edema Breast: Left mastectomy, no discomfort in the  right breast All other systems were reviewed with the patient and are negative.  I have reviewed the past medical history, past surgical history, social history and family history with the patient and they are unchanged from previous note.  ALLERGIES:  is allergic to codeine.  MEDICATIONS:  Current Outpatient Medications  Medication Sig Dispense Refill  . cyclobenzaprine (FLEXERIL) 5 MG tablet TAKE 1 TABLET (5 MG TOTAL) 3 (THREE) TIMES DAILY AS NEEDED BY MOUTH FOR MUSCLE SPASMS. 60 tablet 0  . DEXILANT 60 MG capsule Take 1 capsule (60 mg total) by mouth daily. 30 capsule 11  . docusate sodium (COLACE) 250 MG capsule Take 1 capsule (250 mg total) by mouth daily. 10 capsule 0  . estradiol (ESTRACE) 1 MG tablet Take 1 mg by mouth daily.    Marland Kitchen ibuprofen (ADVIL,MOTRIN) 600 MG tablet '600mg'$  po bid prn 90 tablet 0  . lidocaine (LIDODERM) 5 %   0  . polyvinyl alcohol (LIQUIFILM TEARS) 1.4 % ophthalmic solution Place 1 drop into both eyes as needed for dry eyes.    Marland Kitchen tolterodine (DETROL LA) 2 MG 24 hr capsule TAKE 1 CAPSULE (2 MG TOTAL) DAILY BY MOUTH. 90 capsule 0   No current facility-administered medications for this visit.     PHYSICAL EXAMINATION: ECOG PERFORMANCE STATUS: 1 - Symptomatic but completely ambulatory  Vitals:   10/26/17 1154  BP: (!) 156/91  Pulse: 83  Resp: 18  Temp: 98.6 F (37 C)  SpO2: 100%   Filed Weights   10/26/17 1154  Weight: 97 lb 11.2 oz (44.3 kg)    GENERAL:alert, no distress and comfortable SKIN: skin color, texture, turgor are normal, no rashes or significant lesions EYES: normal, Conjunctiva are pink and non-injected, sclera clear OROPHARYNX:no exudate, no erythema and lips, buccal mucosa, and tongue normal  NECK: supple, thyroid normal size, non-tender, without nodularity LYMPH:  no palpable lymphadenopathy in the cervical, axillary or inguinal LUNGS: clear to auscultation and percussion with normal breathing effort HEART: regular rate & rhythm and  no murmurs and no lower extremity edema ABDOMEN:abdomen soft, non-tender and normal bowel sounds MUSCULOSKELETAL:no cyanosis of digits and no clubbing  NEURO: alert & oriented x 3 with fluent speech, no focal motor/sensory deficits EXTREMITIES: No lower extremity edema BREAST: No palpable lumps or nodules of concern (exam performed in the presence of a chaperone)  LABORATORY DATA:  I have reviewed the data as listed CMP Latest Ref Rng & Units 09/03/2017 05/07/2017 01/24/2017  Glucose 65 - 99 mg/dL 74 79 111  BUN 8 - 27 mg/dL 11 11 7.4  Creatinine 0.57 - 1.00 mg/dL 0.32(L) 0.34(L) 0.5(L)  Sodium 134 - 144 mmol/L 136 136 130(L)  Potassium 3.5 - 5.2 mmol/L 4.2 3.9 4.1  Chloride 96 - 106 mmol/L 93(L) 92(L) -  CO2 20 - 29 mmol/L 26 30(H) 31(H)  Calcium 8.7 - 10.3 mg/dL 9.8 9.6 9.9  Total Protein 6.0 - 8.5 g/dL 7.2 - 7.1  Total Bilirubin 0.0 - 1.2 mg/dL 0.4 - 0.33  Alkaline Phos 39 - 117 IU/L 60 - 54  AST 0 - 40 IU/L 23 - 27  ALT 0 - 32 IU/L 19 - 22    Lab Results  Component Value Date   WBC 5.3 09/03/2017   HGB 13.6 09/03/2017   HCT 39.8 09/03/2017   MCV 93 09/03/2017   PLT 187 09/03/2017   NEUTROABS 3.6 09/03/2017    ASSESSMENT & PLAN:  Breast cancer of upper-outer quadrant of left female breast ([place]) 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    -------------------------------------------------------------------------------------------------------------------------------------------------------- Iron deficiency anemia I instructed the patient that she needs upper endoscopy and colonoscopy. IV iron given 12/02/2015 2 doses Response to IV iron therapy:Improvement in hemoglobin from 10.6-13.8 Today's lab review: 10/26/2017 -------------------------------------------------------------------------------------------------------------------------------------------------------- Hypertension:Under her PCP care. Weight loss: No major improvement.  GERD: On Dexilant.  I renewed her prescription today.  Return to clinic in 1 yearwith lab count check and follow-up.    Orders Placed This Encounter  Procedures  . MM DIAG BREAST TOMO BILATERAL    Standing Status:   Future    Standing Expiration Date:   10/27/2018    Order Specific Question:   Reason for Exam (SYMPTOM  OR DIAGNOSIS REQUIRED)    Answer:   Mammogram with a history of breast cancer    Order Specific Question:   Preferred imaging location?    Answer:   [facility]   The patient has a good understanding of the overall plan. she agrees with it. she will call with any problems that may develop before the next visit here.   Harriette Ohara, MD 10/26/17

## 2017-10-26 NOTE — Telephone Encounter (Signed)
Gave patient AVS and calendar of upcoming may 2020 appointments.

## 2017-10-27 ENCOUNTER — Other Ambulatory Visit: Payer: Self-pay | Admitting: Family Medicine

## 2017-10-27 DIAGNOSIS — N393 Stress incontinence (female) (male): Secondary | ICD-10-CM

## 2017-11-08 MED FILL — DEXILANT DR 60 MG CAPSULE: 60 | 30 days supply | Qty: 30 | Fill #0

## 2017-11-09 ENCOUNTER — Ambulatory Visit
Admission: RE | Admit: 2017-11-09 | Discharge: 2017-11-09 | Disposition: A | Discharge status: Home / Self Care | Payer: 59 | Source: Ambulatory Visit | Attending: Hematology and Oncology | Admitting: Hematology and Oncology

## 2017-11-09 ENCOUNTER — Other Ambulatory Visit: Payer: Self-pay | Admitting: Hematology and Oncology

## 2017-11-09 DIAGNOSIS — Z1231 Encounter for screening mammogram for malignant neoplasm of breast: Secondary | ICD-10-CM

## 2017-11-09 DIAGNOSIS — C50412 Malignant neoplasm of upper-outer quadrant of left female breast: Secondary | ICD-10-CM

## 2017-11-19 ENCOUNTER — Telehealth: Payer: Self-pay

## 2017-11-19 NOTE — Telephone Encounter (Signed)
Received call from pt regarding recent screening mammogram-right results said tissue was extremely dense and to speak with your provider about these results. Pt denies any areas of concern.   I notified Dr Lindi Adie with results and called pt back.  Per him , he is ok w/ these results and will discuss further with pt at next appt. Pt verbalized understanding and no other needs per pt at this time. Her next appt is 10/2018 but verbalized she will call if any changes or concerns.

## 2017-12-03 MED FILL — predniSONE 10 MG (21) TBPK: 10 | 6 days supply | Qty: 21 | Fill #0

## 2017-12-03 MED FILL — DEXILANT DR 60 MG CAPSULE: 60 | 30 days supply | Qty: 30 | Fill #1

## 2017-12-03 MED FILL — LIDOCAINE PATCH 5%: 5 | 30 days supply | Qty: 30 | Fill #0

## 2017-12-31 MED FILL — DEXILANT DR 60 MG CAPSULE: 60 | 30 days supply | Qty: 30 | Fill #2

## 2018-01-25 MED FILL — CELECOXIB 200 MG CAP: 200 | 30 days supply | Qty: 30 | Fill #0

## 2018-02-06 MED FILL — DEXILANT DR 60 MG CAPSULE: 60 | 30 days supply | Qty: 30 | Fill #3

## 2018-03-08 MED FILL — DEXILANT DR 60 MG CAPSULE: 60 | 30 days supply | Qty: 30 | Fill #4

## 2018-03-28 ENCOUNTER — Ambulatory Visit: Payer: Medicare Other | Admitting: Physical Therapy

## 2018-04-04 MED FILL — DEXILANT DR 60 MG CAPSULE: 60 | 30 days supply | Qty: 30 | Fill #5

## 2018-04-27 ENCOUNTER — Other Ambulatory Visit: Payer: Self-pay | Admitting: Family Medicine

## 2018-04-27 DIAGNOSIS — N393 Stress incontinence (female) (male): Secondary | ICD-10-CM

## 2018-05-07 MED FILL — DEXILANT DR 60 MG CAPSULE: 60 | 30 days supply | Qty: 30 | Fill #6

## 2018-06-07 MED FILL — DEXILANT DR 60 MG CAPSULE: 60 | 30 days supply | Qty: 30 | Fill #7

## 2018-06-24 ENCOUNTER — Telehealth: Payer: Self-pay | Admitting: Family

## 2018-06-24 NOTE — Telephone Encounter (Signed)
Pt having episodes of vertigo appt scheduled for evaluation Pt requested Weds

## 2018-06-26 ENCOUNTER — Encounter: Payer: Self-pay | Admitting: Family Medicine

## 2018-06-26 ENCOUNTER — Ambulatory Visit: Payer: 59 | Admitting: Family Medicine

## 2018-06-26 VITALS — BP 160/97 | HR 95 | Temp 97.3°F | Ht 64.0 in | Wt 99.6 lb

## 2018-06-26 DIAGNOSIS — J018 Other acute sinusitis: Secondary | ICD-10-CM

## 2018-06-26 DIAGNOSIS — R05 Cough: Secondary | ICD-10-CM

## 2018-06-26 DIAGNOSIS — J0110 Acute frontal sinusitis, unspecified: Secondary | ICD-10-CM

## 2018-06-26 MED ORDER — METHYLPREDNISOLONE ACETATE 80 MG/ML IJ SUSP
80.0000 mg | Freq: Once | INTRAMUSCULAR | Status: AC
  Administered 2018-06-26: 80 mg via INTRAMUSCULAR

## 2018-06-26 MED ORDER — AMOXICILLIN 500 MG PO CAPS: 500.0000 mg | ORAL_CAPSULE | Freq: Two times a day (BID) | ORAL | 0 refills | Status: DC

## 2018-06-26 NOTE — Progress Notes (Signed)
BP (!) 160/97   Pulse 95   Temp (!) 97.3 F (36.3 C) (Oral)   Ht 5\' 4"  (1.626 m)   Wt 99 lb 9.6 oz (45.2 kg)   LMP 02/28/1999   BMI 17.10 kg/m    Subjective:    Patient ID: Sherry Bender, female    DOB: [birth year], 68 y.o.   MRN: [id number]  HPI: Sherry Bender is a 68 y.o. female presenting on 06/26/2018 for Dizziness (Patient states it has been going on since Sunday night); Headache; and Nasal Congestion   HPI Patient is coming in complaining of cough and sinus congestion and pressure that is been going on for the past 4 days.  She has been having dizziness when she leans forward and sometimes feels like she is going to fall over and she leans forward and tries to pick something up because of the dizziness.  She denies any fevers or chills but does have a lot of headache and congestion and sinus pressure.  She says she gets like this about once a year and usually requires antibiotic and a steroid.  She has been using some over-the-counter Mucinex but nothing is seeming to help at this point.  Relevant past medical, surgical, family and social history reviewed and updated as indicated. Interim medical history since our last visit reviewed. Allergies and medications reviewed and updated.  Review of Systems  Constitutional: Negative for chills and fever.  HENT: Positive for congestion, postnasal drip, rhinorrhea, sinus pressure, sneezing and sore throat. Negative for ear discharge and ear pain.   Eyes: Negative for visual disturbance.  Respiratory: Negative for chest tightness and shortness of breath.   Cardiovascular: Negative for chest pain and leg swelling.  Musculoskeletal: Negative for back pain and gait problem.  Skin: Negative for rash.  Neurological: Positive for dizziness. Negative for light-headedness and headaches.  Psychiatric/Behavioral: Negative for agitation and behavioral problems.  All other systems reviewed and are negative.   Per HPI unless specifically indicated  above   Allergies as of 06/26/2018      Reactions   Codeine Nausea And Vomiting      Medication List       Accurate as of June 26, 2018  2:08 PM. Always use your most recent med list.        amoxicillin 500 MG capsule Commonly known as:  AMOXIL Take 1 capsule (500 mg total) by mouth 2 (two) times daily.   cyclobenzaprine 5 MG tablet Commonly known as:  FLEXERIL TAKE 1 TABLET (5 MG TOTAL) 3 (THREE) TIMES DAILY AS NEEDED BY MOUTH FOR MUSCLE SPASMS.   DEXILANT 60 MG capsule Generic drug:  dexlansoprazole Take 1 capsule (60 mg total) by mouth daily.   docusate sodium 250 MG capsule Commonly known as:  COLACE Take 1 capsule (250 mg total) by mouth daily.   estradiol 1 MG tablet Commonly known as:  ESTRACE Take 1 mg by mouth daily.   ibuprofen 600 MG tablet Commonly known as:  ADVIL,MOTRIN 600mg  po bid prn   lidocaine 5 % Commonly known as:  LIDODERM   polyvinyl alcohol 1.4 % ophthalmic solution Commonly known as:  LIQUIFILM TEARS Place 1 drop into both eyes as needed for dry eyes.   tolterodine 2 MG 24 hr capsule Commonly known as:  DETROL LA TAKE 1 CAPSULE (2 MG TOTAL) DAILY BY MOUTH.          Objective:    BP (!) 160/97   Pulse 95   Temp (!) 97.3  F (36.3 C) (Oral)   Ht 5\' 4"  (1.626 m)   Wt 99 lb 9.6 oz (45.2 kg)   LMP 02/28/1999   BMI 17.10 kg/m   Wt Readings from Last 3 Encounters:  06/26/18 99 lb 9.6 oz (45.2 kg)  10/26/17 97 lb 11.2 oz (44.3 kg)  09/03/17 98 lb 6.4 oz (44.6 kg)    Physical Exam Vitals signs reviewed.  Constitutional:      General: She is not in acute distress.    Appearance: She is well-developed. She is not diaphoretic.  HENT:     Right Ear: Tympanic membrane, ear canal and external ear normal.     Left Ear: Tympanic membrane, ear canal and external ear normal.     Nose: Mucosal edema and rhinorrhea present.     Right Sinus: No maxillary sinus tenderness or frontal sinus tenderness.     Left Sinus: No maxillary sinus  tenderness or frontal sinus tenderness.     Mouth/Throat:     Pharynx: Uvula midline. Posterior oropharyngeal erythema present. No oropharyngeal exudate.     Tonsils: No tonsillar abscesses.  Eyes:     Conjunctiva/sclera: Conjunctivae normal.  Cardiovascular:     Rate and Rhythm: Normal rate and regular rhythm.     Heart sounds: Normal heart sounds. No murmur.  Pulmonary:     Effort: Pulmonary effort is normal. No respiratory distress.     Breath sounds: Normal breath sounds. No wheezing.  Musculoskeletal: Normal range of motion.        General: No tenderness.  Skin:    General: Skin is warm and dry.     Findings: No rash.  Neurological:     Mental Status: She is alert and oriented to person, place, and time.     Coordination: Coordination normal.  Psychiatric:        Behavior: Behavior normal.        Assessment & Plan:   Problem List Items Addressed This Visit    None    Visit Diagnoses    Acute non-recurrent frontal sinusitis    -  Primary   Relevant Medications   methylPREDNISolone acetate (DEPO-MEDROL) injection 80 mg (Start on 06/26/2018  2:15 PM)   amoxicillin (AMOXIL) 500 MG capsule       Follow up plan: Return if symptoms worsen or fail to improve.  Counseling provided for all of the vaccine components No orders of the defined types were placed in this encounter.   Caryl Pina, MD [facility] 06/26/2018, 2:08 PM

## 2018-07-04 MED FILL — DEXILANT DR 60 MG CAPSULE: 60 | 30 days supply | Qty: 30 | Fill #8

## 2018-07-21 ENCOUNTER — Other Ambulatory Visit: Payer: Self-pay | Admitting: Family

## 2018-07-21 DIAGNOSIS — N393 Stress incontinence (female) (male): Secondary | ICD-10-CM

## 2018-08-06 ENCOUNTER — Other Ambulatory Visit: Payer: Self-pay | Admitting: Family

## 2018-08-08 MED FILL — DEXILANT DR 60 MG CAPSULE: 60 | 30 days supply | Qty: 30 | Fill #9

## 2018-09-05 MED FILL — DEXILANT DR 60 MG CAPSULE: 60 | 30 days supply | Qty: 30 | Fill #10

## 2018-09-23 ENCOUNTER — Other Ambulatory Visit: Payer: Self-pay

## 2018-09-23 ENCOUNTER — Ambulatory Visit (INDEPENDENT_AMBULATORY_CARE_PROVIDER_SITE_OTHER): Payer: 59 | Admitting: Family

## 2018-09-23 ENCOUNTER — Encounter: Payer: Self-pay | Admitting: Family

## 2018-09-23 DIAGNOSIS — M25561 Pain in right knee: Secondary | ICD-10-CM | POA: Diagnosis not present

## 2018-09-23 DIAGNOSIS — M25562 Pain in left knee: Secondary | ICD-10-CM | POA: Diagnosis not present

## 2018-09-23 DIAGNOSIS — M62838 Other muscle spasm: Secondary | ICD-10-CM

## 2018-09-23 MED ORDER — CELECOXIB 200 MG PO CAPS: 200.0000 mg | ORAL_CAPSULE | Freq: Every day | ORAL | 1 refills | Status: DC

## 2018-09-23 MED ORDER — CYCLOBENZAPRINE HCL 5 MG PO TABS: 5.0000 mg | ORAL_TABLET | Freq: Three times a day (TID) | ORAL | 0 refills | Status: DC | PRN

## 2018-09-23 NOTE — Progress Notes (Signed)
   Virtual Visit via telephone Note  I connected with Sherry Bender on 09/23/18 at 3:20 pm by telephone and verified that I am speaking with the correct person using two identifiers. Sherry Bender is currently located at home and no one is currently with her during visit. The provider, Evelina Dun, FNP is located in their office at time of visit.  I discussed the limitations, risks, security and privacy concerns of performing an evaluation and management service by telephone and the availability of in person appointments. I also discussed with the patient that there may be a patient responsible charge related to this service. The patient expressed understanding and agreed to proceed.   History and Present Illness:  HPI Pt complaining of bilateral knee pain and leg spasms that have been going on for a week. She states it is worse at night. States sitting for too long makes it worse and getting up and moving makes it better. She has tried OTC cream with mild relief.    Observations/Objective: No SOB or distress noted  Assessment and Plan: 1. Acute pain of both knees Rest Ice  ROM exercises - celecoxib (CELEBREX) 200 MG capsule; Take 1 capsule (200 mg total) by mouth daily.  Dispense: 90 capsule; Refill: 1  2. Muscle spasms of both lower extremities Continue to stay hydrated  ROM exercises  - cyclobenzaprine (FLEXERIL) 5 MG tablet; Take 1 tablet (5 mg total) by mouth 3 (three) times daily as needed for muscle spasms.  Dispense: 60 tablet; Refill: 0    I discussed the assessment and treatment plan with the patient. The patient was provided an opportunity to ask questions and all were answered. The patient agreed with the plan and demonstrated an understanding of the instructions.   The patient was advised to call back or seek an in-person evaluation if the symptoms worsen or if the condition fails to improve as anticipated.  The above assessment and management plan was discussed with  the patient. The patient verbalized understanding of and has agreed to the management plan. Patient is aware to call the clinic if symptoms persist or worsen. Patient is aware when to return to the clinic for a follow-up visit. Patient educated on when it is appropriate to go to the emergency department.    Call ended 3:32, I provided 12 minutes of non-face-to-face time during this encounter.    Evelina Dun, FNP

## 2018-09-26 ENCOUNTER — Telehealth: Payer: Self-pay | Admitting: Family

## 2018-09-26 MED ORDER — CELECOXIB 100 MG PO CAPS: 100.0000 mg | ORAL_CAPSULE | Freq: Two times a day (BID) | ORAL | 3 refills | Status: DC

## 2018-09-26 NOTE — Telephone Encounter (Signed)
Prescription sent to pharmacy.

## 2018-10-03 MED FILL — DEXILANT DR 60 MG CAPSULE: 60 | 30 days supply | Qty: 30 | Fill #11

## 2018-10-09 ENCOUNTER — Telehealth: Payer: Self-pay | Admitting: Family

## 2018-10-10 ENCOUNTER — Ambulatory Visit (INDEPENDENT_AMBULATORY_CARE_PROVIDER_SITE_OTHER): Payer: 59 | Admitting: Family Medicine

## 2018-10-10 ENCOUNTER — Other Ambulatory Visit: Payer: Self-pay

## 2018-10-10 ENCOUNTER — Encounter: Payer: Self-pay | Admitting: Family Medicine

## 2018-10-10 DIAGNOSIS — M25561 Pain in right knee: Secondary | ICD-10-CM | POA: Diagnosis not present

## 2018-10-10 DIAGNOSIS — M25562 Pain in left knee: Secondary | ICD-10-CM

## 2018-10-10 MED ORDER — MELOXICAM 15 MG PO TABS: 15.0000 mg | ORAL_TABLET | Freq: Every day | ORAL | 1 refills | Status: DC

## 2018-10-10 NOTE — Progress Notes (Signed)
Virtual Visit via telephone Note  I connected with Sherry Bender on 10/10/18 at 1003 by telephone and verified that I am speaking with the correct person using two identifiers. Sherry Bender is currently located at home and no other people are currently with her during visit. The provider, Fransisca Kaufmann Dettinger, MD is located in their office at time of visit.  Call ended at 1011  I discussed the limitations, risks, security and privacy concerns of performing an evaluation and management service by telephone and the availability of in person appointments. I also discussed with the patient that there may be a patient responsible charge related to this service. The patient expressed understanding and agreed to proceed.   History and Present Illness: Arthritis  Patient has been having arthritis and been taking celebrex for it but has been having headaches.  And she blames it on the medication and would like to switch to something different.  She still having arthritis both in her neck and in her knees on both sides and she gets some popping and catching at times and would like to try something for this.  She denies any redness or warmth or swelling or numbness or weakness but just more the pain with the arthritis.  No diagnosis found.  Outpatient Encounter Medications as of 10/10/2018  Medication Sig  . celecoxib (CELEBREX) 100 MG capsule Take 1 capsule (100 mg total) by mouth 2 (two) times daily.  . cyclobenzaprine (FLEXERIL) 5 MG tablet Take 1 tablet (5 mg total) by mouth 3 (three) times daily as needed for muscle spasms.  . DEXILANT 60 MG capsule Take 1 capsule (60 mg total) by mouth daily.  Marland Kitchen docusate sodium (COLACE) 250 MG capsule Take 1 capsule (250 mg total) by mouth daily.  Marland Kitchen estradiol (ESTRACE) 1 MG tablet TAKE 1 TABLET BY MOUTH EVERY DAY  . lidocaine (LIDODERM) 5 %   . polyvinyl alcohol (LIQUIFILM TEARS) 1.4 % ophthalmic solution Place 1 drop into both eyes as needed for dry eyes.  Marland Kitchen  tolterodine (DETROL LA) 2 MG 24 hr capsule TAKE 1 CAPSULE (2 MG TOTAL) DAILY BY MOUTH.   No facility-administered encounter medications on file as of 10/10/2018.     Review of Systems  Constitutional: Negative for chills and fever.  Eyes: Negative for visual disturbance.  Respiratory: Negative for chest tightness and shortness of breath.   Cardiovascular: Negative for chest pain and leg swelling.  Musculoskeletal: Positive for arthralgias. Negative for back pain and gait problem.  Skin: Negative for color change and rash.  Neurological: Positive for headaches. Negative for dizziness and light-headedness.  Psychiatric/Behavioral: Negative for agitation and behavioral problems.  All other systems reviewed and are negative.   Observations/Objective: Patient sounds comfortable and in no acute distress  Assessment and Plan: Problem List Items Addressed This Visit    None    Visit Diagnoses    Acute pain of both knees    -  Primary   Relevant Medications   meloxicam (MOBIC) 15 MG tablet       Follow Up Instructions:  Stop the celebrex and take meloxicam instead because of headaches    I discussed the assessment and treatment plan with the patient. The patient was provided an opportunity to ask questions and all were answered. The patient agreed with the plan and demonstrated an understanding of the instructions.   The patient was advised to call back or seek an in-person evaluation if the symptoms worsen or if the condition fails to  improve as anticipated.  The above assessment and management plan was discussed with the patient. The patient verbalized understanding of and has agreed to the management plan. Patient is aware to call the clinic if symptoms persist or worsen. Patient is aware when to return to the clinic for a follow-up visit. Patient educated on when it is appropriate to go to the emergency department.    I provided 8 minutes of non-face-to-face time during this  encounter.    Worthy Rancher, MD

## 2018-10-28 NOTE — Assessment & Plan Note (Signed)
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   -------------------------------------------------------------------------------------------------------------------------------------------------------- Iron deficiency anemia I instructed the patient that she needs upper endoscopy and colonoscopy. IV iron given 12/02/2015 2 doses Response to IV iron therapy:Improvement in hemoglobin from 10.6 to13.8 Labs 09/03/2017: Hemoglobin 13.6 -------------------------------------------------------------------------------------------------------------------------------------------------------- Hypertension:Under her PCP care. Weight loss:No major improvement.  GERD: OnDexilant.  I renewed her prescription today.  Breast cancer surveillance: Mammogram 11/09/2017: Benign breast density category D Return to clinic in 1 yearwith lab count check and follow-up.

## 2018-10-29 ENCOUNTER — Telehealth: Payer: Self-pay | Admitting: Hematology and Oncology

## 2018-10-29 NOTE — Telephone Encounter (Signed)
Called patient regarding upcoming Webex appointment, patient does not have access for Webex nor Doximity and needs this to be a telephone visit.

## 2018-10-30 MED FILL — LIDOCAINE PATCH 5%: 5 | 30 days supply | Qty: 30 | Fill #1

## 2018-10-30 NOTE — Progress Notes (Signed)
HEMATOLOGY-ONCOLOGY TELEPHONE VISIT PROGRESS NOTE  I connected with Sherry Bender on 10/31/2018 at  2:00 PM EDT by telephone and verified that I am speaking with the correct person using two identifiers.  I discussed the limitations, risks, security and privacy concerns of performing an evaluation and management service by telephone and the availability of in person appointments.  I also discussed with the patient that there may be a patient responsible charge related to this service. The patient expressed understanding and agreed to proceed.   History of Present Illness: Sherry Bender is a 68 y.o. female with above-mentioned history of iron deficiency anemia previously treated with IV iron. She also has a history of triple negative left breast cancer treated with neoadjuvant chemotherapy, mastectomy, radiation, and who is currently on observation. I last saw her a year ago. Her most recent mammogram on 11/09/17 showed no evidence of malignancy bilaterally.  C/O leg spasms. Gaining some weight. Pain between shoulder blades    Breast cancer of upper-outer quadrant of left female breast ([place])   11/09/2014 Mammogram    Left breast irregular hypoechoic mass 12:00 position 5.3 x 3.7 x 2.9 cm, left low axilla normal sized lymph nodes    11/09/2014 Initial Diagnosis    Left Bx: IDC with DCIS; Grade 3, ER - (0%), PR- (0%), Her 2 Neg, Ki 67: 70%    11/26/2014 Breast MRI    3.5 x 4.5 x 3.8 cm biopsy-proven malignancy within the upper/ upper inner left breast. Scattered foci adjacent to the biopsy-proven malignancy and within the upper left breast are indeterminate.    11/26/2014 Clinical Stage    Stage IIB: T3 N0    11/30/2014 -  Neo-Adjuvant Chemotherapy    Dose dense Adriamycin and Cytoxan 4 followed by Abraxane weekly 12    04/05/2015 Breast MRI    Breast complete imaging response to neoadjuvant chemotherapy    04/30/2015 Surgery    Left mastectomy: IDC 2 mm residual disease, 1 lymph node  positive for isolated tumor cells within normal lymphatics. 0/3 Lymph Nodes    04/30/2015 Pathologic Stage    Stage IA: pT1a pN0    06/28/2015 - 08/12/2015 Radiation Therapy    Adjuvant radiation therapy: Left chest wall and supraclavicular fossa 50.4 Gy over 28 fractions; left chest wall boost 10 Gy over 5 fractions. Total dose 60.4 Gy    12/02/2015 Survivorship    Survivorship care plan provided to patient     Observations/Objective:  No clinical evidence of disease relapse.   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   -------------------------------------------------------------------------------------------------------------------------------------------------------- Iron deficiency anemia I instructed the patient that she needs upper endoscopy and colonoscopy. IV iron given 12/02/2015 2 doses Response to IV iron therapy:Improvement in hemoglobin from 10.6 to13.8 Labs 09/03/2017: Hemoglobin 13.6 -------------------------------------------------------------------------------------------------------------------------------------------------------- Hypertension:Under her PCP care. Weight loss:Slight improvement.  GERD: OnDexilant.  I renewed her prescription today.  Breast cancer  surveillance: Mammogram  11/09/2017: Benign breast density category D Return to clinic in 1 yearwith lab count check and follow-up.  I discussed the assessment and treatment plan with the patient. The patient was provided an opportunity to ask questions and all were answered. The patient agreed with the plan and demonstrated an understanding of the instructions. The patient was advised to call back or seek an in-person evaluation if the symptoms worsen or if the condition fails to improve as anticipated.   I provided 12 minutes of non-face-to-face time during this encounter.   Rulon Eisenmenger, MD 10/31/2018    I, Molly Dorshimer, am acting as scribe for Nicholas Lose, MD.  I have reviewed the above documentation for accuracy and completeness, and I agree with the above.

## 2018-10-31 ENCOUNTER — Inpatient Hospital Stay: Payer: Medicare Other | Attending: Hematology and Oncology | Admitting: Hematology and Oncology

## 2018-10-31 DIAGNOSIS — C50412 Malignant neoplasm of upper-outer quadrant of left female breast: Secondary | ICD-10-CM | POA: Diagnosis not present

## 2018-10-31 DIAGNOSIS — Z171 Estrogen receptor negative status [ER-]: Secondary | ICD-10-CM | POA: Diagnosis not present

## 2018-10-31 MED ORDER — DEXILANT 60 MG PO CPDR: 60.0000 mg | DELAYED_RELEASE_CAPSULE | Freq: Every day | ORAL | 11 refills | Status: DC

## 2018-10-31 MED FILL — DEXILANT DR 60 MG CAPSULE: 60 | 30 days supply | Qty: 30 | Fill #0

## 2018-11-01 ENCOUNTER — Telehealth: Payer: Self-pay | Admitting: Hematology and Oncology

## 2018-11-01 NOTE — Telephone Encounter (Signed)
Tried to reach regarding schedule °

## 2018-11-06 ENCOUNTER — Telehealth: Payer: Self-pay | Admitting: Family

## 2018-11-30 MED FILL — LIDOCAINE PATCH 5%: 5 | 30 days supply | Qty: 30 | Fill #2

## 2018-12-09 ENCOUNTER — Telehealth: Payer: Self-pay | Admitting: *Deleted

## 2018-12-09 NOTE — Telephone Encounter (Signed)
Received call from pt stating her insurance is no longer covering Dexilant 60 mg even though she has been taking it for several years.  Information given to [facility], LPN working with prior auths to investigate and fill out prior [employer] paperwork.

## 2018-12-12 MED FILL — DEXILANT DR 60 MG CAPSULE: 60 | 30 days supply | Qty: 30 | Fill #1

## 2018-12-25 ENCOUNTER — Other Ambulatory Visit: Payer: Self-pay

## 2018-12-26 ENCOUNTER — Encounter: Payer: Self-pay | Admitting: Family

## 2018-12-26 ENCOUNTER — Ambulatory Visit: Payer: 59 | Admitting: Family

## 2018-12-26 VITALS — BP 138/85 | HR 93 | Temp 97.5°F | Ht 64.0 in | Wt 102.0 lb

## 2018-12-26 DIAGNOSIS — Z1212 Encounter for screening for malignant neoplasm of rectum: Secondary | ICD-10-CM

## 2018-12-26 DIAGNOSIS — E785 Hyperlipidemia, unspecified: Secondary | ICD-10-CM

## 2018-12-26 DIAGNOSIS — I10 Essential (primary) hypertension: Secondary | ICD-10-CM

## 2018-12-26 DIAGNOSIS — D509 Iron deficiency anemia, unspecified: Secondary | ICD-10-CM

## 2018-12-26 DIAGNOSIS — K219 Gastro-esophageal reflux disease without esophagitis: Secondary | ICD-10-CM | POA: Diagnosis not present

## 2018-12-26 DIAGNOSIS — G8929 Other chronic pain: Secondary | ICD-10-CM

## 2018-12-26 DIAGNOSIS — R636 Underweight: Secondary | ICD-10-CM

## 2018-12-26 DIAGNOSIS — Z0001 Encounter for general adult medical examination with abnormal findings: Secondary | ICD-10-CM

## 2018-12-26 DIAGNOSIS — F411 Generalized anxiety disorder: Secondary | ICD-10-CM

## 2018-12-26 DIAGNOSIS — N393 Stress incontinence (female) (male): Secondary | ICD-10-CM | POA: Diagnosis not present

## 2018-12-26 DIAGNOSIS — Z1211 Encounter for screening for malignant neoplasm of colon: Secondary | ICD-10-CM

## 2018-12-26 DIAGNOSIS — Z0000 Encounter for general adult medical examination without abnormal findings: Secondary | ICD-10-CM

## 2018-12-26 MED ORDER — TOLTERODINE TARTRATE ER 2 MG PO CP24: 2.0000 mg | ORAL_CAPSULE | Freq: Every day | ORAL | 1 refills | Status: DC

## 2018-12-26 MED ORDER — LIDOCAINE 5 % EX PTCH: 1.0000 | MEDICATED_PATCH | CUTANEOUS | 2 refills | Status: DC

## 2018-12-26 NOTE — Patient Instructions (Signed)
Shoulder Pain Many things can cause shoulder pain, including:  An injury to the shoulder.  Overuse of the shoulder.  Arthritis. The source of the pain can be:  Inflammation.  An injury to the shoulder joint.  An injury to a tendon, ligament, or bone. Follow these instructions at home: Pay attention to changes in your symptoms. Let your health care provider know about them. Follow these instructions to relieve your pain. If you have a sling:  Wear the sling as told by your health care provider. Remove it only as told by your health care provider.  Loosen the sling if your fingers tingle, become numb, or turn cold and blue.  Keep the sling clean.  If the sling is not waterproof: ? Do not let it get wet. Remove it to shower or bathe.  Move your arm as little as possible, but keep your hand moving to prevent swelling. Managing pain, stiffness, and swelling   If directed, put ice on the painful area: ? Put ice in a plastic bag. ? Place a towel between your skin and the bag. ? Leave the ice on for 20 minutes, 2-3 times per day. Stop applying ice if it does not help with the pain.  Squeeze a soft ball or a foam pad as much as possible. This helps to keep the shoulder from swelling. It also helps to strengthen the arm. General instructions  Take over-the-counter and prescription medicines only as told by your health care provider.  Keep all follow-up visits as told by your health care provider. This is important. Contact a health care provider if:  Your pain gets worse.  Your pain is not relieved with medicines.  New pain develops in your arm, hand, or fingers. Get help right away if:  Your arm, hand, or fingers: ? Tingle. ? Become numb. ? Become swollen. ? Become painful. ? Turn white or blue. Summary  Shoulder pain can be caused by an injury, overuse, or arthritis.  Pay attention to changes in your symptoms. Let your health care provider know about them.   This condition may be treated with a sling, ice, and pain medicines.  Contact your health care provider if the pain gets worse or new pain develops. Get help right away if your arm, hand, or fingers tingle or become numb, swollen, or painful.  Keep all follow-up visits as told by your health care provider. This is important. This information is not intended to replace advice given to you by your health care provider. Make sure you discuss any questions you have with your health care provider. Document Released: 03/15/2005 Document Revised: 12/18/2017 Document Reviewed: 12/18/2017 Elsevier Patient Education  2020 [employer]  

## 2018-12-26 NOTE — Progress Notes (Signed)
Subjective:    Patient ID: Sherry Bender, female    DOB: [birth year], 68 y.o.   MRN: [id number]  Chief Complaint  Patient presents with  . due for annual exam   Pt presents to the office today for CPE and chronic follow up. Pt is followed by Oncologists annually for hx Breast cancer.  Hypertension This is a chronic problem. The current episode started more than 1 year ago. The problem has been waxing and waning since onset. The problem is uncontrolled. Associated symptoms include anxiety and malaise/fatigue. Pertinent negatives include no peripheral edema or shortness of breath. Risk factors for coronary artery disease include dyslipidemia and sedentary lifestyle. The current treatment provides moderate improvement. There is no history of CAD/MI or CVA.  Gastroesophageal Reflux She complains of heartburn. She reports no belching or no coughing. This is a chronic problem. The current episode started more than 1 year ago. The problem occurs occasionally. She has tried a PPI for the symptoms. The treatment provided moderate relief.  Anemia Presents for follow-up visit. Symptoms include bruises/bleeds easily and malaise/fatigue.  Anxiety Presents for follow-up visit. Symptoms include decreased concentration, depressed mood, excessive worry, irritability, nervous/anxious behavior and restlessness. Patient reports no shortness of breath. Symptoms occur most days.   Her past medical history is significant for anemia.  Shoulder Pain  The pain is present in the left shoulder. This is a chronic problem. The current episode started more than 1 month ago. There has been a history of trauma [place] in 2018). The problem occurs intermittently. The problem has been waxing and waning. The quality of the pain is described as aching. The pain is at a severity of 6/10. The pain is moderate. She has tried OTC ointments and rest for the symptoms. The treatment provided mild relief.      Review of Systems   Constitutional: Positive for irritability and malaise/fatigue.  Respiratory: Negative for cough and shortness of breath.   Gastrointestinal: Positive for heartburn.  Hematological: Bruises/bleeds easily.  Psychiatric/Behavioral: Positive for decreased concentration. The patient is nervous/anxious.   All other systems reviewed and are negative.      Objective:   Physical Exam Vitals signs reviewed.  Constitutional:      General: She is not in acute distress.    Appearance: She is well-developed.  HENT:     Head: Normocephalic and atraumatic.     Right Ear: Tympanic membrane normal.     Left Ear: Tympanic membrane normal.  Eyes:     Pupils: Pupils are equal, round, and reactive to light.  Neck:     Musculoskeletal: Normal range of motion and neck supple.     Thyroid: No thyromegaly.  Cardiovascular:     Rate and Rhythm: Normal rate and regular rhythm.     Heart sounds: Normal heart sounds. No murmur.  Pulmonary:     Effort: Pulmonary effort is normal. No respiratory distress.     Breath sounds: Normal breath sounds. No wheezing.  Abdominal:     General: Bowel sounds are normal. There is no distension.     Palpations: Abdomen is soft.     Tenderness: There is no abdominal tenderness.  Musculoskeletal: Normal range of motion.        General: No tenderness.     Right lower leg: Edema (2+) present.     Left lower leg: Edema (2+) present.  Skin:    General: Skin is warm and dry.  Neurological:     Mental Status: She is alert  and oriented to person, place, and time.     Cranial Nerves: No cranial nerve deficit.     Deep Tendon Reflexes: Reflexes are normal and symmetric.  Psychiatric:        Behavior: Behavior normal.        Thought Content: Thought content normal.        Judgment: Judgment normal.       BP 138/85   Pulse 93   Temp (!) 97.5 F (36.4 C) (Oral)   Ht 5' 4"  (1.626 m)   Wt 102 lb (46.3 kg)   LMP 02/28/1999   BMI 17.51 kg/m      Assessment & Plan:   Nimrit Kehres comes in today with chief complaint of due for annual exam   Diagnosis and orders addressed:  1. SUI (stress urinary incontinence, female) - tolterodine (DETROL LA) 2 MG 24 hr capsule; Take 1 capsule (2 mg total) by mouth daily.  Dispense: 90 capsule; Refill: 1 - CMP14+EGFR  2. Annual physical exam - CMP14+EGFR - Lipid panel - TSH - lidocaine (LIDODERM) 5 %; Place 1 patch onto the skin daily.  Dispense: 30 patch; Refill: 2  3. Essential hypertension - CMP14+EGFR  4. Gastroesophageal reflux disease, esophagitis presence not specified - CMP14+EGFR  5. Underweight - CMP14+EGFR  6. Iron deficiency anemia, unspecified iron deficiency anemia type - CMP14+EGFR - Anemia Profile B  7. Hyperlipidemia, unspecified hyperlipidemia type - CMP14+EGFR - Lipid panel  8. Generalized anxiety disorder - CMP14+EGFR  9. Chronic left shoulder pain - Ambulatory referral to Orthopedic Surgery  10. Colon cancer screening - Cologuard  11. Screening for malignant neoplasm of the rectum - Cologuard   Labs pending Health Maintenance reviewed Diet and exercise encouraged  Follow up plan: 1 year   Evelina Dun, FNP

## 2018-12-27 LAB — LIPID PANEL
Chol/HDL Ratio: 2.2 ratio (ref 0.0–4.4)
Cholesterol, Total: 200 mg/dL — ABNORMAL HIGH (ref 100–199)
HDL: 93 mg/dL (ref >39)
LDL Calculated: 90 mg/dL (ref 0–99)
Triglycerides: 83 mg/dL (ref 0–149)
VLDL Cholesterol Cal: 17 mg/dL (ref 5–40)

## 2018-12-27 LAB — CMP14+EGFR
ALT: 24 IU/L (ref 0–32)
AST: 27 IU/L (ref 0–40)
Albumin/Globulin Ratio: 2 (ref 1.2–2.2)
Albumin: 4.7 g/dL (ref 3.8–4.8)
Alkaline Phosphatase: 69 IU/L (ref 39–117)
BUN/Creatinine Ratio: 34 — ABNORMAL HIGH (ref 12–28)
BUN: 12 mg/dL (ref 8–27)
Bilirubin Total: 0.3 mg/dL (ref 0.0–1.2)
CO2: 24 mmol/L (ref 20–29)
Calcium: 9.6 mg/dL (ref 8.7–10.3)
Chloride: 95 mmol/L — ABNORMAL LOW (ref 96–106)
Creatinine, Ser: 0.35 mg/dL — ABNORMAL LOW (ref 0.57–1.00)
GFR calc Af Amer: 130 mL/min/{1.73_m2} (ref >59)
GFR calc non Af Amer: 113 mL/min/{1.73_m2} (ref >59)
Globulin, Total: 2.3 g/dL (ref 1.5–4.5)
Glucose: 83 mg/dL (ref 65–99)
Potassium: 4.2 mmol/L (ref 3.5–5.2)
Sodium: 135 mmol/L (ref 134–144)
Total Protein: 7 g/dL (ref 6.0–8.5)

## 2018-12-27 LAB — ANEMIA PROFILE B
Basophils Absolute: 0 10*3/uL (ref 0.0–0.2)
Basos: 1 %
EOS (ABSOLUTE): 0.2 10*3/uL (ref 0.0–0.4)
Eos: 5 %
Ferritin: 34 ng/mL (ref 15–150)
Folate: 11.6 ng/mL (ref >3.0)
Hematocrit: 39.2 % (ref 34.0–46.6)
Hemoglobin: 13.2 g/dL (ref 11.1–15.9)
Immature Grans (Abs): 0 10*3/uL (ref 0.0–0.1)
Immature Granulocytes: 0 %
Iron Saturation: 19 % (ref 15–55)
Iron: 55 ug/dL (ref 27–139)
Lymphocytes Absolute: 0.9 10*3/uL (ref 0.7–3.1)
Lymphs: 18 %
MCH: 31.7 pg (ref 26.6–33.0)
MCHC: 33.7 g/dL (ref 31.5–35.7)
MCV: 94 fL (ref 79–97)
Monocytes Absolute: 0.5 10*3/uL (ref 0.1–0.9)
Monocytes: 10 %
Neutrophils Absolute: 3.2 10*3/uL (ref 1.4–7.0)
Neutrophils: 66 %
Platelets: 161 10*3/uL (ref 150–450)
RBC: 4.17 x10E6/uL (ref 3.77–5.28)
RDW: 12.3 % (ref 11.7–15.4)
Retic Ct Pct: 1.1 % (ref 0.6–2.6)
Total Iron Binding Capacity: 291 ug/dL (ref 250–450)
UIBC: 236 ug/dL (ref 118–369)
Vitamin B-12: 743 pg/mL (ref 232–1245)
WBC: 4.8 10*3/uL (ref 3.4–10.8)

## 2018-12-27 LAB — TSH: TSH: 1.51 u[IU]/mL (ref 0.450–4.500)

## 2019-01-02 ENCOUNTER — Ambulatory Visit (INDEPENDENT_AMBULATORY_CARE_PROVIDER_SITE_OTHER): Payer: 59

## 2019-01-02 ENCOUNTER — Encounter: Payer: Self-pay | Admitting: Orthopaedic Surgery

## 2019-01-02 ENCOUNTER — Ambulatory Visit (INDEPENDENT_AMBULATORY_CARE_PROVIDER_SITE_OTHER): Payer: 59 | Admitting: Orthopaedic Surgery

## 2019-01-02 ENCOUNTER — Other Ambulatory Visit: Payer: Self-pay

## 2019-01-02 VITALS — BP 100/47 | HR 48 | Ht 64.0 in | Wt 102.0 lb

## 2019-01-02 DIAGNOSIS — G8929 Other chronic pain: Secondary | ICD-10-CM | POA: Diagnosis not present

## 2019-01-02 DIAGNOSIS — M25512 Pain in left shoulder: Secondary | ICD-10-CM

## 2019-01-02 DIAGNOSIS — M7542 Impingement syndrome of left shoulder: Secondary | ICD-10-CM | POA: Diagnosis not present

## 2019-01-02 MED ORDER — METHYLPREDNISOLONE ACETATE 40 MG/ML IJ SUSP
40.0000 mg | INTRAMUSCULAR | Status: AC | PRN
  Administered 2019-01-02: 15:00:00 40 mg via INTRA_ARTICULAR

## 2019-01-02 MED ORDER — BUPIVACAINE HCL 0.25 % IJ SOLN
4.0000 mL | INTRAMUSCULAR | Status: AC | PRN
  Administered 2019-01-02: 15:00:00 4 mL via INTRA_ARTICULAR

## 2019-01-02 MED ORDER — LIDOCAINE HCL 1 % IJ SOLN
0.5000 mL | INTRAMUSCULAR | Status: AC | PRN
  Administered 2019-01-02: 15:00:00 .5 mL

## 2019-01-02 NOTE — Progress Notes (Signed)
Office Visit Note   Patient: Sherry Bender           Date of Birth: Dec 20, 1950           MRN: [id number] Visit Date: 01/02/2019              Requested by: Sharion Balloon, [address] [place],  [place] [id number] PCP: Sharion Balloon, FNP   Assessment & Plan: Visit Diagnoses:  1. Chronic left shoulder pain   2. Impingement syndrome of left shoulder     Plan: Subacromial injection performed recheck 3 weeks.  She has been through extensive therapy am not sure further exercises are likely to improve.  She understands some weakness as expected after the proximal humerus fracture.  Hopefully the injection will settle down her symptoms.  Follow-Up Instructions: Return if symptoms worsen or fail to improve.   Orders:  Orders Placed This Encounter  Procedures   Large Joint Inj   XR Shoulder Left   No orders of the defined types were placed in this encounter.     Procedures: Large Joint Inj: R subacromial bursa on 01/02/2019 2:56 PM Indications: pain Details: 22 G 1.5 in needle  Arthrogram: No  Medications: 4 mL bupivacaine 0.25 %; 40 mg methylPREDNISolone acetate 40 MG/ML; 0.5 mL lidocaine 1 % Outcome: tolerated well, no immediate complications Procedure, treatment alternatives, risks and benefits explained, specific risks discussed. Consent was given by the patient. Immediately prior to procedure a time out was called to verify the correct patient, procedure, equipment, support staff and site/side marked as required. Patient was prepped and draped in the usual sterile fashion.       Clinical Data: No additional findings.   Subjective: Chief Complaint  Patient presents with   Left Shoulder - Pain    History of previous fracture     HPI [age]-year-old female with left shoulder pain but states is fairly severe 8 out of 10 and she not been able get her arm up overhead since proximal humerus fracture January 2019 which was impacted fracture with slight  angulation with 30 to 45 degrees.  She was treated at [facility].  She states she has had some pain underneath her shoulder blade is noticed some weakness with internal rotation.  She uses Tylenol, ibuprofen, Bayer back and body, heat, Lidoderm patch, therapy initially formal therapy now home therapy.  She has had previous left mastectomy lymph node resection, radiation and chemotherapy.  Cervical fusion 2015 done by Dr. Ronnald Ramp multilevel.  Review of Systems positive for left breast cancer gallbladder surgery 94 knee arthroscopy 2005 non-smoker nondrinker positive for acid reflux.  Left proximal humerus fracture otherwise noncontributory 14 point system.   Objective: Vital Signs: BP (!) 100/47    Pulse (!) 48    Ht 5\' 4"  (1.626 m)    Wt 102 lb (46.3 kg)    LMP 02/28/1999    BMI 17.51 kg/m   Physical Exam Constitutional:      Appearance: She is well-developed.  HENT:     Head: Normocephalic.     Right Ear: External ear normal.     Left Ear: External ear normal.  Eyes:     Pupils: Pupils are equal, round, and reactive to light.  Neck:     Thyroid: No thyromegaly.     Trachea: No tracheal deviation.  Cardiovascular:     Rate and Rhythm: Normal rate.  Pulmonary:     Effort: Pulmonary effort is normal.  Abdominal:  Palpations: Abdomen is soft.  Skin:    General: Skin is warm and dry.  Neurological:     Mental Status: She is alert and oriented to person, place, and time.  Psychiatric:        Behavior: Behavior normal.     Ortho Exam patient has weakness with subscap resisted testing left shoulder.  She can reach her hand posteriorly past posterior axillary line.  Abduction to 110.  Passively she gets to 120 with pain.  Positive impingement.  Negative Yergason.  No distal bike sepsis muscle migration.  No brachial plexus tenderness on the left.  Specialty Comments:  No specialty comments available.  Imaging: Xr Shoulder Left  Result Date: 01/02/2019 2 view x-rays and  axillary lateral x-ray obtained and reviewed in comparison to January 2019 images.  This shows complete healing of the impacted slightly angulated proximal humerus fracture.  No significant glenohumeral arthritis.    PMFS History: Patient Active Problem List   Diagnosis Date Noted   Impingement syndrome of left shoulder 01/02/2019   Closed fracture of head of left humerus 06/01/2017   Underweight 12/25/2016   Loss of weight 07/03/2016   History of esophageal stricture 07/03/2016   Dysphagia 06/08/2016   Hypersensitivity reaction 12/02/2015   Iron deficiency anemia 11/24/2015   Breast cancer of upper-outer quadrant of left female breast ([place]) 11/11/2014   S/P cervical spinal fusion 06/24/2014   HTN (hypertension) 12/06/2010   Tobacco abuse 12/06/2010   Hyperlipidemia 09/26/2010   Nodular goiter 09/26/2010   GERD (gastroesophageal reflux disease) 09/26/2010   Degenerative disc disease 09/26/2010   Allergic rhinitis 09/26/2010   Anxiety disorder 09/26/2010   Cyst, breast 09/26/2010   Past Medical History:  Diagnosis Date   Anxiety    Arthritis    IN BACK....   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 ovarian cancer vs. cervical   Cancer Paternal Aunt        ? breast    Past Surgical History:  Procedure Laterality Date   ABDOMINAL HYSTERECTOMY  2000   TAH/BSO   ANTERIOR CERVICAL DECOMP/DISCECTOMY FUSION N/A 06/18/2014   Procedure: C4-5 C5-6 C6-7 ANTERIOR CERVICAL  DECOMPRESSION/DISKECTOMY/FUSION;  Surgeon: Eustace Moore, MD;  Location: [facility];  Service: Neurosurgery;  Laterality: N/A;  C4-5 C5-6 C6-7 ANTERIOR CERVICAL DECOMPRESSION/DISKECTOMY/FUSION   ANTERIOR CERVICAL DECOMP/DISCECTOMY FUSION N/A 06/25/2014   Procedure: EXPLORATION OF CERVICAL FUSION WITH REVIOSN OF HARDWARE;  Surgeon: Eustace Moore, MD;  Location: [facility];  Service: Neurosurgery;  Laterality: N/A;   CERVICAL FUSION     CHOLECYSTECTOMY  1994   COMPLETE MASTECTOMY W/ SENTINEL NODE BIOPSY Left 04/30/2015   FOOT NEUROMA SURGERY  01/2010   also had metatarasl shortened   KNEE ARTHROSCOPY  2005   right knee   MASTECTOMY Left 2016   PORT-A-CATH REMOVAL Right 04/30/2015   Procedure: REMOVAL PORT-A-CATH;  Surgeon: Fanny Skates, MD;  Location: [facility];  Service: General;  Laterality: Right;   PORTACATH PLACEMENT N/A 11/23/2014   Procedure: INSERTION PORT-A-CATH WITH ULTRASOUND;  Surgeon: Fanny Skates, MD;  Location: [facility];  Service: General;  Laterality: N/A;   RHINOPLASTY     SIMPLE MASTECTOMY WITH AXILLARY SENTINEL NODE BIOPSY Left 04/30/2015   Procedure: LEFT TOTAL  MASTECTOMY WITH LEFT AXILLARY SENTINEL NODE BIOPSY;  Surgeon: Fanny Skates, MD;  Location: [facility];  Service: General;  Laterality: Left;   Social History   Occupational History   Not on file  Tobacco Use   Smoking status: Former Smoker    Packs/day: 0.25    Years: 42.00    Pack years: 10.50    Types: Cigarettes    Quit date: 01/24/2017    Years since quitting: 1.9   Smokeless tobacco: Never Used  Substance and Sexual Activity   Alcohol use: No   Drug use: No   Sexual activity: Never

## 2019-01-07 MED FILL — DEXILANT DR 60 MG CAPSULE: 60 | 30 days supply | Qty: 30 | Fill #2

## 2019-02-05 ENCOUNTER — Telehealth: Payer: Self-pay

## 2019-02-05 MED FILL — DEXILANT DR 60 MG CAPSULE: 60 | 30 days supply | Qty: 30 | Fill #3

## 2019-02-05 NOTE — Telephone Encounter (Signed)
Voicemail received from [facility] that patient's [employer] is requiring a prior authorization.     Number provided for PA [phone].  RN to forward information to PA staff.

## 2019-02-12 ENCOUNTER — Ambulatory Visit (INDEPENDENT_AMBULATORY_CARE_PROVIDER_SITE_OTHER): Payer: 59 | Admitting: Family

## 2019-02-12 ENCOUNTER — Encounter: Payer: Self-pay | Admitting: Family

## 2019-02-12 ENCOUNTER — Other Ambulatory Visit: Payer: Self-pay

## 2019-02-12 DIAGNOSIS — B9689 Other specified bacterial agents as the cause of diseases classified elsewhere: Secondary | ICD-10-CM | POA: Diagnosis not present

## 2019-02-12 DIAGNOSIS — J208 Acute bronchitis due to other specified organisms: Secondary | ICD-10-CM | POA: Diagnosis not present

## 2019-02-12 MED ORDER — AMOXICILLIN-POT CLAVULANATE 875-125 MG PO TABS: 1.0000 | ORAL_TABLET | Freq: Two times a day (BID) | ORAL | 0 refills | Status: DC

## 2019-02-12 NOTE — Progress Notes (Signed)
   Virtual Visit via telephone Note Due to COVID-19 pandemic this visit was conducted virtually. This visit type was conducted due to national recommendations for restrictions regarding the COVID-19 Pandemic (e.g. social distancing, sheltering in place) in an effort to limit this patient's exposure and mitigate transmission in our community. All issues noted in this document were discussed and addressed.  A physical exam was not performed with this format.  I connected with Sherry Bender on 02/12/19 at 10:26 AM  by telephone and verified that I am speaking with the correct person using two identifiers. Sherry Bender is currently located at home  and her husband is currently with her during visit. The provider, Evelina Dun, FNP is located in their office at time of visit.  I discussed the limitations, risks, security and privacy concerns of performing an evaluation and management service by telephone and the availability of in person appointments. I also discussed with the patient that there may be a patient responsible charge related to this service. The patient expressed understanding and agreed to proceed.   History and Present Illness:  Cough This is a new problem. The current episode started in the past 7 days. The problem has been unchanged. The cough is productive of sputum. Associated symptoms include headaches, myalgias, nasal congestion, shortness of breath and wheezing. Pertinent negatives include no chills, ear congestion, ear pain, fever or sore throat. She has tried rest and OTC cough suppressant for the symptoms. The treatment provided mild relief.      Review of Systems  Constitutional: Negative for chills and fever.  HENT: Negative for ear pain and sore throat.   Respiratory: Positive for cough, shortness of breath and wheezing.   Musculoskeletal: Positive for myalgias.  Neurological: Positive for headaches.  All other systems reviewed and are negative.     Observations/Objective: No SOB or distress noted   Assessment and Plan: 1. Acute bacterial bronchitis Pt will go for COVID testing today. She does not want to, but given her symptoms we do need to rule this out. - Use a cool mist humidifier  -Use saline nose sprays frequently -Force fluids -For any cough or congestion  Use plain Mucinex- regular strength or max strength is fine -For fever or aces or pains- take tylenol or ibuprofen. -Call if symptoms worsen or do not improve  - amoxicillin-clavulanate (AUGMENTIN) 875-125 MG tablet; Take 1 tablet by mouth 2 (two) times daily.  Dispense: 14 tablet; Refill: 0    I discussed the assessment and treatment plan with the patient. The patient was provided an opportunity to ask questions and all were answered. The patient agreed with the plan and demonstrated an understanding of the instructions.   The patient was advised to call back or seek an in-person evaluation if the symptoms worsen or if the condition fails to improve as anticipated.  The above assessment and management plan was discussed with the patient. The patient verbalized understanding of and has agreed to the management plan. Patient is aware to call the clinic if symptoms persist or worsen. Patient is aware when to return to the clinic for a follow-up visit. Patient educated on when it is appropriate to go to the emergency department.   Time call ended: 10:38 AM   I provided 12 minutes of non-face-to-face time during this encounter.    Evelina Dun, FNP

## 2019-02-14 ENCOUNTER — Telehealth: Payer: Self-pay | Admitting: Gastroenterology

## 2019-02-14 ENCOUNTER — Telehealth: Payer: Self-pay | Admitting: Family

## 2019-02-14 NOTE — Telephone Encounter (Signed)
Patient states that she is supposed to get COVID tested Monday at 1pm.

## 2019-02-14 NOTE — Telephone Encounter (Signed)
Pt called and aware of [place] recommendations.

## 2019-02-14 NOTE — Telephone Encounter (Signed)
Having issues of reflux. PCP put the patient on Augmentin for URI. She is also experiencing LE edema. Both of these issues are new and states she first noticed 4 days ago. Denies any rash or redness of her skin. No itching. She agrees to contact the PCP today about her LE edema.  Offered an appointment on Monday 02/17/19 for evaluation. She declines due to issues with transportation. Agrees to the next available APP opening on 02/27/19.

## 2019-02-14 NOTE — Telephone Encounter (Signed)
Did patient go get COVID tested?

## 2019-02-14 NOTE — Telephone Encounter (Signed)
Augmentin is a strong antibiotic and should cover a sinus infection. Her symptoms are very closely related to COVID and we have to rule this out as this could be viral. She should go get tested today.

## 2019-02-14 NOTE — Telephone Encounter (Signed)
Patient did a tele visit with Alyse Low 8/26 and was given Augmentin and states she is no better and has started having bilateral feet swelling and diarrhea since yesterday.  Requesting something else sent to [facility] in [place].

## 2019-02-17 ENCOUNTER — Telehealth: Payer: Self-pay | Admitting: Family

## 2019-02-17 NOTE — Telephone Encounter (Signed)
Complaining of short of breath and edema which is new onset.  Advised to go to emergency due to her condition no appointments available with pcp today.  She was unsure if she would go for care but did schedule an appointment for Thursday.

## 2019-02-18 ENCOUNTER — Emergency Department (HOSPITAL_COMMUNITY): Payer: 59

## 2019-02-18 ENCOUNTER — Inpatient Hospital Stay (HOSPITAL_COMMUNITY)
Admission: EM | Admit: 2019-02-18 | Discharge: 2019-02-21 | DRG: 291 | Disposition: A | Discharge status: Home / Self Care | Payer: 59 | Attending: Family Medicine | Admitting: Family Medicine

## 2019-02-18 ENCOUNTER — Encounter (HOSPITAL_COMMUNITY): Payer: Self-pay | Admitting: Emergency Medicine

## 2019-02-18 ENCOUNTER — Inpatient Hospital Stay (HOSPITAL_COMMUNITY): Payer: 59

## 2019-02-18 ENCOUNTER — Other Ambulatory Visit: Payer: Self-pay

## 2019-02-18 DIAGNOSIS — Z833 Family history of diabetes mellitus: Secondary | ICD-10-CM | POA: Diagnosis not present

## 2019-02-18 DIAGNOSIS — E876 Hypokalemia: Secondary | ICD-10-CM | POA: Diagnosis present

## 2019-02-18 DIAGNOSIS — C50912 Malignant neoplasm of unspecified site of left female breast: Secondary | ICD-10-CM | POA: Diagnosis present

## 2019-02-18 DIAGNOSIS — Z923 Personal history of irradiation: Secondary | ICD-10-CM

## 2019-02-18 DIAGNOSIS — F419 Anxiety disorder, unspecified: Secondary | ICD-10-CM | POA: Diagnosis present

## 2019-02-18 DIAGNOSIS — I429 Cardiomyopathy, unspecified: Secondary | ICD-10-CM | POA: Diagnosis present

## 2019-02-18 DIAGNOSIS — I5043 Acute on chronic combined systolic (congestive) and diastolic (congestive) heart failure: Secondary | ICD-10-CM | POA: Diagnosis present

## 2019-02-18 DIAGNOSIS — Z885 Allergy status to narcotic agent status: Secondary | ICD-10-CM | POA: Diagnosis not present

## 2019-02-18 DIAGNOSIS — I5021 Acute systolic (congestive) heart failure: Secondary | ICD-10-CM

## 2019-02-18 DIAGNOSIS — I340 Nonrheumatic mitral (valve) insufficiency: Secondary | ICD-10-CM

## 2019-02-18 DIAGNOSIS — Z90722 Acquired absence of ovaries, bilateral: Secondary | ICD-10-CM

## 2019-02-18 DIAGNOSIS — I110 Hypertensive heart disease with heart failure: Principal | ICD-10-CM | POA: Diagnosis present

## 2019-02-18 DIAGNOSIS — K219 Gastro-esophageal reflux disease without esophagitis: Secondary | ICD-10-CM | POA: Diagnosis present

## 2019-02-18 DIAGNOSIS — Z853 Personal history of malignant neoplasm of breast: Secondary | ICD-10-CM | POA: Diagnosis not present

## 2019-02-18 DIAGNOSIS — Z79899 Other long term (current) drug therapy: Secondary | ICD-10-CM

## 2019-02-18 DIAGNOSIS — Z9012 Acquired absence of left breast and nipple: Secondary | ICD-10-CM

## 2019-02-18 DIAGNOSIS — E785 Hyperlipidemia, unspecified: Secondary | ICD-10-CM | POA: Diagnosis present

## 2019-02-18 DIAGNOSIS — J9600 Acute respiratory failure, unspecified whether with hypoxia or hypercapnia: Secondary | ICD-10-CM | POA: Diagnosis present

## 2019-02-18 DIAGNOSIS — Z20828 Contact with and (suspected) exposure to other viral communicable diseases: Secondary | ICD-10-CM | POA: Diagnosis present

## 2019-02-18 DIAGNOSIS — Z9049 Acquired absence of other specified parts of digestive tract: Secondary | ICD-10-CM

## 2019-02-18 DIAGNOSIS — M419 Scoliosis, unspecified: Secondary | ICD-10-CM | POA: Diagnosis present

## 2019-02-18 DIAGNOSIS — I10 Essential (primary) hypertension: Secondary | ICD-10-CM | POA: Diagnosis not present

## 2019-02-18 DIAGNOSIS — Z981 Arthrodesis status: Secondary | ICD-10-CM

## 2019-02-18 DIAGNOSIS — I493 Ventricular premature depolarization: Secondary | ICD-10-CM | POA: Diagnosis present

## 2019-02-18 DIAGNOSIS — Z90710 Acquired absence of both cervix and uterus: Secondary | ICD-10-CM | POA: Diagnosis not present

## 2019-02-18 DIAGNOSIS — Z8249 Family history of ischemic heart disease and other diseases of the circulatory system: Secondary | ICD-10-CM

## 2019-02-18 DIAGNOSIS — M549 Dorsalgia, unspecified: Secondary | ICD-10-CM | POA: Diagnosis present

## 2019-02-18 DIAGNOSIS — Z87891 Personal history of nicotine dependence: Secondary | ICD-10-CM

## 2019-02-18 DIAGNOSIS — I5041 Acute combined systolic (congestive) and diastolic (congestive) heart failure: Secondary | ICD-10-CM | POA: Diagnosis not present

## 2019-02-18 DIAGNOSIS — Z85828 Personal history of other malignant neoplasm of skin: Secondary | ICD-10-CM | POA: Diagnosis not present

## 2019-02-18 DIAGNOSIS — J189 Pneumonia, unspecified organism: Secondary | ICD-10-CM | POA: Diagnosis present

## 2019-02-18 DIAGNOSIS — R0600 Dyspnea, unspecified: Secondary | ICD-10-CM

## 2019-02-18 DIAGNOSIS — R609 Edema, unspecified: Secondary | ICD-10-CM

## 2019-02-18 DIAGNOSIS — Z9221 Personal history of antineoplastic chemotherapy: Secondary | ICD-10-CM

## 2019-02-18 DIAGNOSIS — Z5329 Procedure and treatment not carried out because of patient's decision for other reasons: Secondary | ICD-10-CM | POA: Diagnosis present

## 2019-02-18 LAB — CBC WITH DIFFERENTIAL/PLATELET
Abs Immature Granulocytes: 0.02 10*3/uL (ref 0.00–0.07)
Basophils Absolute: 0 10*3/uL (ref 0.0–0.1)
Basophils Relative: 1 %
Eosinophils Absolute: 0.1 10*3/uL (ref 0.0–0.5)
Eosinophils Relative: 2 %
HCT: 41 % (ref 36.0–46.0)
Hemoglobin: 13.1 g/dL (ref 12.0–15.0)
Immature Granulocytes: 1 %
Lymphocytes Relative: 10 %
Lymphs Abs: 0.4 10*3/uL — ABNORMAL LOW (ref 0.7–4.0)
MCH: 30 pg (ref 26.0–34.0)
MCHC: 32 g/dL (ref 30.0–36.0)
MCV: 93.8 fL (ref 80.0–100.0)
Monocytes Absolute: 0.4 10*3/uL (ref 0.1–1.0)
Monocytes Relative: 10 %
Neutro Abs: 3.2 10*3/uL (ref 1.7–7.7)
Neutrophils Relative %: 76 %
Platelets: 130 10*3/uL — ABNORMAL LOW (ref 150–400)
RBC: 4.37 MIL/uL (ref 3.87–5.11)
RDW: 13.4 % (ref 11.5–15.5)
WBC: 4.1 10*3/uL (ref 4.0–10.5)
nRBC: 0 % (ref 0.0–0.2)

## 2019-02-18 LAB — COMPREHENSIVE METABOLIC PANEL
ALT: 29 U/L (ref 0–44)
AST: 34 U/L (ref 15–41)
Albumin: 4.2 g/dL (ref 3.5–5.0)
Alkaline Phosphatase: 58 U/L (ref 38–126)
Anion gap: 12 (ref 5–15)
BUN: 11 mg/dL (ref 8–23)
CO2: 28 mmol/L (ref 22–32)
Calcium: 9.5 mg/dL (ref 8.9–10.3)
Chloride: 97 mmol/L — ABNORMAL LOW (ref 98–111)
Creatinine, Ser: 0.31 mg/dL — ABNORMAL LOW (ref 0.44–1.00)
GFR calc Af Amer: >60 mL/min (ref >60)
GFR calc non Af Amer: >60 mL/min (ref >60)
Glucose, Bld: 89 mg/dL (ref 70–99)
Potassium: 3.4 mmol/L — ABNORMAL LOW (ref 3.5–5.1)
Sodium: 137 mmol/L (ref 135–145)
Total Bilirubin: 0.6 mg/dL (ref 0.3–1.2)
Total Protein: 7.2 g/dL (ref 6.5–8.1)

## 2019-02-18 LAB — TROPONIN I (HIGH SENSITIVITY)
Troponin I (High Sensitivity): 26 ng/L — ABNORMAL HIGH (ref <18)
Troponin I (High Sensitivity): 22 ng/L — ABNORMAL HIGH (ref <18)
Troponin I (High Sensitivity): 25 ng/L — ABNORMAL HIGH (ref <18)

## 2019-02-18 LAB — ECHOCARDIOGRAM COMPLETE

## 2019-02-18 LAB — BRAIN NATRIURETIC PEPTIDE: B Natriuretic Peptide: 1157.3 pg/mL — ABNORMAL HIGH (ref 0.0–100.0)

## 2019-02-18 LAB — SARS CORONAVIRUS 2 BY RT PCR (HOSPITAL ORDER, PERFORMED IN ~~LOC~~ HOSPITAL LAB): SARS Coronavirus 2: NEGATIVE

## 2019-02-18 MED ORDER — SODIUM CHLORIDE 0.9% FLUSH: 3.0000 mL | INTRAVENOUS | Status: DC | PRN

## 2019-02-18 MED ORDER — AMOXICILLIN-POT CLAVULANATE 875-125 MG PO TABS: 1.0000 | ORAL_TABLET | Freq: Two times a day (BID) | ORAL | Status: DC

## 2019-02-18 MED ORDER — DIPHENHYDRAMINE HCL 25 MG PO CAPS: 25.0000 mg | ORAL_CAPSULE | Freq: Four times a day (QID) | ORAL | Status: DC | PRN

## 2019-02-18 MED ORDER — SODIUM CHLORIDE 0.9 % IV SOLN: 250.0000 mL | INTRAVENOUS | Status: DC | PRN

## 2019-02-18 MED ORDER — DIPHENHYDRAMINE HCL 25 MG PO CAPS
25.0000 mg | ORAL_CAPSULE | Freq: Every evening | ORAL | Status: DC | PRN
  Administered 2019-02-18 – 2019-02-19 (×2): 25 mg via ORAL
  Filled 2019-02-18 (×2): qty 1

## 2019-02-18 MED ORDER — NON FORMULARY: 6.0000 mg | Freq: Every evening | Status: DC | PRN

## 2019-02-18 MED ORDER — FUROSEMIDE 10 MG/ML IJ SOLN
40.0000 mg | Freq: Two times a day (BID) | INTRAMUSCULAR | Status: AC
  Administered 2019-02-18 – 2019-02-20 (×5): 40 mg via INTRAVENOUS
  Filled 2019-02-18 (×5): qty 4

## 2019-02-18 MED ORDER — FAMOTIDINE 20 MG PO TABS: 20.0000 mg | ORAL_TABLET | Freq: Two times a day (BID) | ORAL | Status: DC | PRN

## 2019-02-18 MED ORDER — SODIUM CHLORIDE 0.9% FLUSH
3.0000 mL | Freq: Two times a day (BID) | INTRAVENOUS | Status: DC
  Administered 2019-02-19 – 2019-02-21 (×4): 3 mL via INTRAVENOUS

## 2019-02-18 MED ORDER — ENOXAPARIN SODIUM 40 MG/0.4ML ~~LOC~~ SOLN
40.0000 mg | SUBCUTANEOUS | Status: DC
  Administered 2019-02-18 – 2019-02-20 (×3): 40 mg via SUBCUTANEOUS
  Filled 2019-02-18 (×4): qty 0.4

## 2019-02-18 MED ORDER — ONDANSETRON HCL 4 MG/2ML IJ SOLN: 4.0000 mg | Freq: Four times a day (QID) | INTRAMUSCULAR | Status: DC | PRN

## 2019-02-18 MED ORDER — FESOTERODINE FUMARATE ER 4 MG PO TB24
4.0000 mg | ORAL_TABLET | Freq: Every day | ORAL | Status: DC
  Administered 2019-02-18 – 2019-02-21 (×4): 4 mg via ORAL
  Filled 2019-02-18 (×4): qty 1

## 2019-02-18 MED ORDER — SODIUM CHLORIDE (PF) 0.9 % IJ SOLN
INTRAMUSCULAR | Status: AC
  Filled 2019-02-18: qty 50

## 2019-02-18 MED ORDER — MELATONIN 3 MG PO TABS
6.0000 mg | ORAL_TABLET | Freq: Every evening | ORAL | Status: DC | PRN
  Administered 2019-02-20: 6 mg via ORAL
  Filled 2019-02-18 (×3): qty 2

## 2019-02-18 MED ORDER — LISINOPRIL 5 MG PO TABS
2.5000 mg | ORAL_TABLET | Freq: Every day | ORAL | Status: DC
  Administered 2019-02-18: 2.5 mg via ORAL
  Filled 2019-02-18 (×2): qty 1

## 2019-02-18 MED ORDER — LEVOFLOXACIN 500 MG PO TABS
500.0000 mg | ORAL_TABLET | Freq: Every day | ORAL | Status: DC
  Administered 2019-02-18 – 2019-02-21 (×4): 500 mg via ORAL
  Filled 2019-02-18 (×4): qty 1

## 2019-02-18 MED ORDER — DOCUSATE SODIUM 100 MG PO CAPS
100.0000 mg | ORAL_CAPSULE | Freq: Every day | ORAL | Status: DC
  Administered 2019-02-19 – 2019-02-21 (×3): 100 mg via ORAL
  Filled 2019-02-18 (×3): qty 1

## 2019-02-18 MED ORDER — POLYETHYLENE GLYCOL 3350 17 G PO PACK: 17.0000 g | PACK | Freq: Every day | ORAL | Status: DC | PRN

## 2019-02-18 MED ORDER — ACETAMINOPHEN 325 MG PO TABS
650.0000 mg | ORAL_TABLET | ORAL | Status: DC | PRN
  Administered 2019-02-18 – 2019-02-19 (×2): 650 mg via ORAL
  Filled 2019-02-18 (×2): qty 2

## 2019-02-18 NOTE — ED Provider Notes (Signed)
[facility] DEPT Provider Note   CSN: [id number] Arrival date & time: 02/18/19  0818     History   Chief Complaint Chief Complaint  Patient presents with  . Leg Swelling    bilat  . Cough    HPI Sherry Bender is a 68 y.o. female.     68 year old female with prior medical history as detailed below presents for evaluation of cough, mild shortness of breath, and increased edema to both lower extremities.  Patient reports onset of symptoms her last 1 to 2 weeks.  Patient has been seen by her PCP for same complaint.  She has completed a course of Augmentin for her symptoms without improvement.  She reports that she did obtain an outpatient COVID screen yesterday - she does not have the results of that test yet.  She denies associated fever, chest pain, nausea, vomiting, or other acute complaint.    The history is provided by the patient and medical records.  Cough Cough characteristics:  Productive Sputum characteristics:  Unable to specify Severity:  Moderate Onset quality:  Gradual Duration:  2 days Timing:  Constant Progression:  Waxing and waning Chronicity:  New Relieved by:  Nothing Worsened by:  Nothing Ineffective treatments: course of augmentin    Past Medical History:  Diagnosis Date  . Anxiety   . Arthritis    IN BACK....  . Breast cancer ([place]) 2016  . Cancer ([place])    SKIN CANCERS ON FACE  . Complication of anesthesia    "HARD TO WAKE UP".......Marland KitchenN/V  . GERD (gastroesophageal reflux disease)   . Hyperlipidemia   . Hypertension   . PONV (postoperative nausea and vomiting)   . Scoliosis   . Urinary frequency     Patient Active Problem List   Diagnosis Date Noted  . Impingement syndrome of left shoulder 01/02/2019  . Closed fracture of head of left humerus 06/01/2017  . Underweight 12/25/2016  . Loss of weight 07/03/2016  . History of esophageal stricture 07/03/2016  . Dysphagia 06/08/2016  . Hypersensitivity reaction  12/02/2015  . Iron deficiency anemia 11/24/2015  . Breast cancer of upper-outer quadrant of left female breast ([place]) 11/11/2014  . S/P cervical spinal fusion 06/24/2014  . HTN (hypertension) 12/06/2010  . Tobacco abuse 12/06/2010  . Hyperlipidemia 09/26/2010  . Nodular goiter 09/26/2010  . GERD (gastroesophageal reflux disease) 09/26/2010  . Degenerative disc disease 09/26/2010  . Allergic rhinitis 09/26/2010  . Anxiety disorder 09/26/2010  . Cyst, breast 09/26/2010    Past Surgical History:  Procedure Laterality Date  . ABDOMINAL HYSTERECTOMY  2000   TAH/BSO  . ANTERIOR CERVICAL DECOMP/DISCECTOMY FUSION N/A 06/18/2014   Procedure: C4-5 C5-6 C6-7 ANTERIOR CERVICAL DECOMPRESSION/DISKECTOMY/FUSION;  Surgeon: Eustace Moore, MD;  Location: [facility];  Service: Neurosurgery;  Laterality: N/A;  C4-5 C5-6 C6-7 ANTERIOR CERVICAL DECOMPRESSION/DISKECTOMY/FUSION  . ANTERIOR CERVICAL DECOMP/DISCECTOMY FUSION N/A 06/25/2014   Procedure: EXPLORATION OF CERVICAL FUSION WITH REVIOSN OF HARDWARE;  Surgeon: Eustace Moore, MD;  Location: [facility];  Service: Neurosurgery;  Laterality: N/A;  . CERVICAL FUSION    . CHOLECYSTECTOMY  1994  . COMPLETE MASTECTOMY W/ SENTINEL NODE BIOPSY Left 04/30/2015  . FOOT NEUROMA SURGERY  01/2010   also had metatarasl shortened  . KNEE ARTHROSCOPY  2005   right knee  . MASTECTOMY Left 2016  . PORT-A-CATH REMOVAL Right 04/30/2015   Procedure: REMOVAL PORT-A-CATH;  Surgeon: Fanny Skates, MD;  Location: [facility];  Service: General;  Laterality: Right;  .  PORTACATH PLACEMENT N/A 11/23/2014   Procedure: INSERTION PORT-A-CATH WITH ULTRASOUND;  Surgeon: Fanny Skates, MD;  Location: [facility];  Service: General;  Laterality: N/A;  . RHINOPLASTY    . SIMPLE MASTECTOMY WITH AXILLARY SENTINEL NODE BIOPSY Left 04/30/2015   Procedure: LEFT TOTAL  MASTECTOMY WITH LEFT AXILLARY SENTINEL NODE BIOPSY;  Surgeon: Fanny Skates, MD;  Location: [facility];  Service:  General;  Laterality: Left;     OB History   No obstetric history on file.      Home Medications    Prior to Admission medications   Medication Sig Start Date End Date Taking? Authorizing Provider  amoxicillin-clavulanate (AUGMENTIN) 875-125 MG tablet Take 1 tablet by mouth 2 (two) times daily. 02/12/19   Evelina Dun A, FNP  DEXILANT 60 MG capsule Take 1 capsule (60 mg total) by mouth daily. 10/31/18   Nicholas Lose, MD  docusate sodium (COLACE) 250 MG capsule Take 1 capsule (250 mg total) by mouth daily. 05/14/17   Duffy Bruce, MD  estradiol (ESTRACE) 1 MG tablet TAKE 1 TABLET BY MOUTH EVERY DAY 08/07/18   Dettinger, Fransisca Kaufmann, MD  lidocaine (LIDODERM) 5 % Place 1 patch onto the skin daily. 12/26/18   Sharion Balloon, FNP  polyvinyl alcohol (LIQUIFILM TEARS) 1.4 % ophthalmic solution Place 1 drop into both eyes as needed for dry eyes.    [provider]  tolterodine (DETROL LA) 2 MG 24 hr capsule Take 1 capsule (2 mg total) by mouth daily. 12/26/18   Sharion Balloon, FNP    Family History Family History  Problem Relation Age of Onset  . Heart disease Mother   . Hypertension Mother   . Diabetes Mother   . Heart disease Father   . Hypertension Father   . Cancer Maternal Grandmother        ? ovarian cancer vs. cervical  . Cancer Paternal Aunt        ? breast    Social History Social History   Tobacco Use  . Smoking status: Former Smoker    Packs/day: 0.25    Years: 42.00    Pack years: 10.50    Types: Cigarettes    Quit date: 01/24/2017    Years since quitting: 2.0  . Smokeless tobacco: Never Used  Substance Use Topics  . Alcohol use: No  . Drug use: No     Allergies   Codeine   Review of Systems Review of Systems  Respiratory: Positive for cough.   All other systems reviewed and are negative.    Physical Exam Updated Vital Signs BP (!) 157/88 (BP Location: Right Arm)   Pulse 89   Temp 97.9 F (36.6 C) (Oral)   Resp 16   LMP 02/28/1999    SpO2 96%   Physical Exam Vitals signs and nursing note reviewed.  Constitutional:      General: She is not in acute distress.    Appearance: Normal appearance. She is well-developed.  HENT:     Head: Normocephalic and atraumatic.  Eyes:     Conjunctiva/sclera: Conjunctivae normal.     Pupils: Pupils are equal, round, and reactive to light.  Neck:     Musculoskeletal: Normal range of motion and neck supple.  Cardiovascular:     Rate and Rhythm: Normal rate and regular rhythm.     Pulses: Normal pulses.     Heart sounds: Normal heart sounds.  Pulmonary:     Effort: Pulmonary effort is normal. No respiratory distress.  Breath sounds: Normal breath sounds. No stridor. No wheezing or rhonchi.  Abdominal:     General: There is no distension.     Palpations: Abdomen is soft.     Tenderness: There is no abdominal tenderness.  Musculoskeletal: Normal range of motion.        General: No deformity.  Skin:    General: Skin is warm and dry.  Neurological:     General: No focal deficit present.     Mental Status: She is alert and oriented to person, place, and time.      ED Treatments / Results  Labs (all labs ordered are listed, but only abnormal results are displayed) Labs Reviewed  COMPREHENSIVE METABOLIC PANEL - Abnormal; Notable for the following components:      Result Value   Potassium 3.4 (*)    Chloride 97 (*)    Creatinine, Ser 0.31 (*)    All other components within normal limits  BRAIN NATRIURETIC PEPTIDE - Abnormal; Notable for the following components:   B Natriuretic Peptide 1,157.3 (*)    All other components within normal limits  CBC WITH DIFFERENTIAL/PLATELET - Abnormal; Notable for the following components:   Platelets 130 (*)    Lymphs Abs 0.4 (*)    All other components within normal limits  TROPONIN I (HIGH SENSITIVITY) - Abnormal; Notable for the following components:   Troponin I (High Sensitivity) 26 (*)    All other components within normal limits   SARS CORONAVIRUS 2 (HOSPITAL ORDER, [facility])  TROPONIN I (HIGH SENSITIVITY)    EKG EKG Interpretation  Date/Time:  Tuesday February 18 2019 09:34:31 EDT Ventricular Rate:  96 PR Interval:    QRS Duration: 95 QT Interval:  373 QTC Calculation: 472 R Axis:   -38 Text Interpretation:  Sinus rhythm Right atrial enlargement Left axis deviation Anteroseptal infarct, age indeterminate Lateral leads are also involved Confirmed by Dene Gentry [phone]) on 02/18/2019 9:47:19 AM   Radiology Dg Chest Port 1 View  Result Date: 02/18/2019 CLINICAL DATA:  Dyspnea, lower extremity swelling, productive EXAM: PORTABLE CHEST 1 VIEW COMPARISON:  06/24/2014 FINDINGS: Cardiomegaly. There is a small to moderate left pleural effusion with associated atelectasis or consolidation. The visualized skeletal structures are unremarkable. IMPRESSION: 1. There is a small to moderate left pleural effusion with associated atelectasis or consolidation, concerning for infection or aspiration. 2.  Cardiomegaly. Electronically Signed   By: Eddie Candle M.D.   On: 02/18/2019 10:02    Procedures Procedures (including critical care time)  Medications Ordered in ED Medications - No data to display   Initial Impression / Assessment and Plan / ED Course  I have reviewed the triage vital signs and the nursing notes.  Pertinent labs & imaging results that were available during my care of the patient were reviewed by me and considered in my medical decision making (see chart for details).        MDM  Screen complete  Haylynn Kehn was evaluated in Emergency Department on 02/18/2019 for the symptoms described in the history of present illness. She was evaluated in the context of the global COVID-19 pandemic, which necessitated consideration that the patient might be at risk for infection with the SARS-CoV-2 virus that causes COVID-19. Institutional protocols and algorithms that pertain to the  evaluation of patients at risk for COVID-19 are in a state of rapid change based on information released by regulatory bodies including the CDC and federal and state organizations. These policies and  algorithms were followed during the patient's care in the ED.  Patient is presenting for evaluation of reported shortness of breath and associated bilateral lower extremity edema.  Presentation is concerning for possible CHF exacerbation. Patient without prior reported history of cardiac disease.  Patient with left sided pleural effusion, elevated BNP, initial Trop of 26.   Patient likely to benefit from admission for further workup and treatment.  Hospitalist aware of case.   Patient refused CT chest (she reports that she cannot lie flat on scanner table secondary to back pain).    Final Clinical Impressions(s) / ED Diagnoses   Final diagnoses:  Peripheral edema  Dyspnea, unspecified type    ED Discharge Orders    None       Valarie Merino, MD 02/18/19 1125

## 2019-02-18 NOTE — ED Notes (Signed)
[place].

## 2019-02-18 NOTE — Progress Notes (Signed)
Echocardiogram 2D Echocardiogram has been performed.  Oneal Deputy Rahmon Heigl 02/18/2019, 4:05 PM

## 2019-02-18 NOTE — H&P (Signed)
History and Physical    Sherry Bender [id number] DOB: [birth year] DOA: 02/18/2019  PCP: Sharion Balloon, FNP  Patient coming from: Home  I have personally briefly reviewed patient's old medical records in [place]  Chief Complaint: As of breath, cough, bilateral leg swelling since 2 weeks  HPI: Sherry Bender is a 68 y.o. female with medical history significant of hypertension, hyperlipidemia, GERD presents to ED with shortness of breath with some clear productive cough and bilateral leg edema for more than 2 weeks now.  Patient denies any fevers or chills.  She reports shortness of breath both at rest and and worse on exertion, has orthopnea no PND, associated with bilateral lower extremity edema, patient denies any chest pain.  Patient denies any nausea vomiting diarrhea or abdominal pain she does not have any dysuria, headaches, dizziness, blurry vision, melena, hematochezia.     ED Course: Afebrile, tachycardic to 105/min, tachypneic, slightly hypertensive with blood pressure of 157/88.  Lab work revealed potassium of 3.4, creatinine of 0.31, B NP of 1157, troponin of 26, 22, 25.  CBC remarkable for platelets of 130. COVID-19 screening test negative. Chest x-ray shows There is a small to moderate left pleural effusion with associated atelectasis or consolidation, concerning for infection or Aspiration.   She was referred to medical service for admission for evaluation of CHF.   Review of Systems: As per HPI otherwise  "All others reviewed and are negative,"   Past Medical History:  Diagnosis Date  . Anxiety   . Arthritis    IN BACK....  . Breast cancer ([place]) 2016  . Cancer ([place])    SKIN CANCERS ON FACE  . Complication of anesthesia    "HARD TO WAKE UP".......Marland KitchenN/V  . GERD (gastroesophageal reflux disease)   . Hyperlipidemia   . Hypertension   . PONV (postoperative nausea and vomiting)   . Scoliosis   . Urinary frequency     Past Surgical History:  Procedure  Laterality Date  . ABDOMINAL HYSTERECTOMY  2000   TAH/BSO  . ANTERIOR CERVICAL DECOMP/DISCECTOMY FUSION N/A 06/18/2014   Procedure: C4-5 C5-6 C6-7 ANTERIOR CERVICAL DECOMPRESSION/DISKECTOMY/FUSION;  Surgeon: Eustace Moore, MD;  Location: [facility];  Service: Neurosurgery;  Laterality: N/A;  C4-5 C5-6 C6-7 ANTERIOR CERVICAL DECOMPRESSION/DISKECTOMY/FUSION  . ANTERIOR CERVICAL DECOMP/DISCECTOMY FUSION N/A 06/25/2014   Procedure: EXPLORATION OF CERVICAL FUSION WITH REVIOSN OF HARDWARE;  Surgeon: Eustace Moore, MD;  Location: [facility];  Service: Neurosurgery;  Laterality: N/A;  . CERVICAL FUSION    . CHOLECYSTECTOMY  1994  . COMPLETE MASTECTOMY W/ SENTINEL NODE BIOPSY Left 04/30/2015  . FOOT NEUROMA SURGERY  01/2010   also had metatarasl shortened  . KNEE ARTHROSCOPY  2005   right knee  . MASTECTOMY Left 2016  . PORT-A-CATH REMOVAL Right 04/30/2015   Procedure: REMOVAL PORT-A-CATH;  Surgeon: Fanny Skates, MD;  Location: [facility];  Service: General;  Laterality: Right;  . PORTACATH PLACEMENT N/A 11/23/2014   Procedure: INSERTION PORT-A-CATH WITH ULTRASOUND;  Surgeon: Fanny Skates, MD;  Location: [facility];  Service: General;  Laterality: N/A;  . RHINOPLASTY    . SIMPLE MASTECTOMY WITH AXILLARY SENTINEL NODE BIOPSY Left 04/30/2015   Procedure: LEFT TOTAL  MASTECTOMY WITH LEFT AXILLARY SENTINEL NODE BIOPSY;  Surgeon: Fanny Skates, MD;  Location: [facility];  Service: General;  Laterality: Left;   Social history  reports that she quit smoking about 2 years ago. Her smoking use included cigarettes. She has a 10.50 pack-year smoking  history. She has never used smokeless tobacco. She reports that she does not drink alcohol or use drugs.  Allergies  Allergen Reactions  . Codeine Nausea And Vomiting    Family History  Problem Relation Age of Onset  . Heart disease Mother   . Hypertension Mother   . Diabetes Mother   . Heart disease Father   . Hypertension Father   . Cancer  Maternal Grandmother        ? ovarian cancer vs. cervical  . Cancer Paternal Aunt        ? breast   Family history reviewed and pertinent Prior to Admission medications   Medication Sig Start Date End Date Taking? Authorizing Provider  DEXILANT 60 MG capsule Take 1 capsule (60 mg total) by mouth daily. 10/31/18  Yes Nicholas Lose, MD  estradiol (ESTRACE) 1 MG tablet TAKE 1 TABLET BY MOUTH EVERY DAY Patient taking differently: Take 1 mg by mouth daily.  08/07/18  Yes Dettinger, Fransisca Kaufmann, MD  famotidine (PEPCID) 20 MG tablet Take 20 mg by mouth 2 (two) times daily as needed for heartburn or indigestion.   Yes [provider]  lidocaine (LIDODERM) 5 % Place 1 patch onto the skin daily. 12/26/18  Yes Hawks, Christy A, FNP  polyvinyl alcohol (LIQUIFILM TEARS) 1.4 % ophthalmic solution Place 1 drop into both eyes as needed for dry eyes.   Yes [provider]  tolterodine (DETROL LA) 2 MG 24 hr capsule Take 1 capsule (2 mg total) by mouth daily. 12/26/18  Yes Hawks, Christy A, FNP  amoxicillin-clavulanate (AUGMENTIN) 875-125 MG tablet Take 1 tablet by mouth 2 (two) times daily. 02/12/19   Evelina Dun A, FNP  docusate sodium (COLACE) 250 MG capsule Take 1 capsule (250 mg total) by mouth daily. Patient not taking: Reported on 02/18/2019 05/14/17   Duffy Bruce, MD    Physical Exam:  Constitutional: Comfortable not in any kind of distress. Vitals:   02/18/19 1030 02/18/19 1130 02/18/19 1330 02/18/19 1500  BP: (!) 132/99 140/66 (!) 142/77 (!) 145/84  Pulse: 87 83 84 84  Resp: 17 (!) 25 (!) 23 (!) 25  Temp:      TempSrc:      SpO2: 96% 95% 95% 97%   Eyes: PERRL, lids and conjunctivae normal ENMT: Mucous membranes are moist.  Neck: normal, supple, no masses, no thyromegaly Respiratory: Scattered rails at bases, air entry fair, no wheezing or rhonchi Cardiovascular: Regular rate and rhythm, no murmurs / rubs / gallops.  1+ leg edema present abdomen: Abdomen is soft, nontender,  nondistended, no organomegaly appreciated. Musculoskeletal: Bilateral leg edema 2+, no cyanosis or clubbing good range of movements. Skin: no rashes, lesions, ulcers. No induration Neurologic: CN 2-12 grossly intact. Sensation intact, DTR normal. Strength 5/5 in all 4.  Psychiatric: Normal judgment and insight. Alert and oriented x 3. Normal mood.     Labs on Admission: I have personally reviewed following labs and imaging studies  CBC: Recent Labs  Lab 02/18/19 0936  WBC 4.1  NEUTROABS 3.2  HGB 13.1  HCT 41.0  MCV 93.8  PLT [id number]   Basic Metabolic Panel: Recent Labs  Lab 02/18/19 0936  NA 137  K 3.4*  CL 97*  CO2 28  GLUCOSE 89  BUN 11  CREATININE 0.31*  CALCIUM 9.5   GFR: CrCl cannot be calculated (Unknown ideal weight.). Liver Function Tests: Recent Labs  Lab 02/18/19 0936  AST 34  ALT 29  ALKPHOS 58  BILITOT 0.6  PROT 7.2  ALBUMIN 4.2   No results for input(s): LIPASE, AMYLASE in the last 168 hours. No results for input(s): AMMONIA in the last 168 hours. Coagulation Profile: No results for input(s): INR, PROTIME in the last 168 hours. Cardiac Enzymes: No results for input(s): CKTOTAL, CKMB, CKMBINDEX, TROPONINI in the last 168 hours. BNP (last 3 results) No results for input(s): PROBNP in the last 8760 hours. HbA1C: No results for input(s): HGBA1C in the last 72 hours. CBG: No results for input(s): GLUCAP in the last 168 hours. Lipid Profile: No results for input(s): CHOL, HDL, LDLCALC, TRIG, CHOLHDL, LDLDIRECT in the last 72 hours. Thyroid Function Tests: No results for input(s): TSH, T4TOTAL, FREET4, T3FREE, THYROIDAB in the last 72 hours. Anemia Panel: No results for input(s): VITAMINB12, FOLATE, FERRITIN, TIBC, IRON, RETICCTPCT in the last 72 hours. Urine analysis:    Component Value Date/Time   COLORURINE YELLOW 03/16/2009 [place] 03/16/2009 1403   LABSPEC 1.005 12/07/2014 1302   PHURINE 7.5 12/07/2014 1302   PHURINE  6.0 03/16/2009 1403   GLUCOSEU Negative 12/07/2014 1302   HGBUR Negative 12/07/2014 1302   HGBUR NEGATIVE 03/16/2009 1403   BILIRUBINUR Negative 12/07/2014 1302   KETONESUR Negative 12/07/2014 1302   KETONESUR NEGATIVE 03/16/2009 1403   PROTEINUR Negative 12/07/2014 1302   PROTEINUR NEGATIVE 03/16/2009 1403   UROBILINOGEN 0.2 12/07/2014 1302   NITRITE Negative 12/07/2014 1302   NITRITE NEGATIVE 03/16/2009 1403   LEUKOCYTESUR Negative 12/07/2014 1302    Radiological Exams on Admission: Dg Chest Port 1 View  Result Date: 02/18/2019 CLINICAL DATA:  Dyspnea, lower extremity swelling, productive EXAM: PORTABLE CHEST 1 VIEW COMPARISON:  06/24/2014 FINDINGS: Cardiomegaly. There is a small to moderate left pleural effusion with associated atelectasis or consolidation. The visualized skeletal structures are unremarkable. IMPRESSION: 1. There is a small to moderate left pleural effusion with associated atelectasis or consolidation, concerning for infection or aspiration. 2.  Cardiomegaly. Electronically Signed   By: Eddie Candle M.D.   On: 02/18/2019 10:02    EKG: Independently reviewed.  His rhythm with left axis deviation and right atrial enlargement present.  Assessment/Plan Active Problems:   Acute respiratory failure (HCC)    Acute respiratory failure secondary to acute combined systolic and diastolic heart failure Admit to telemetry and start the patient on IV Lasix 40 mg twice daily.  Echocardiogram done today showed The left ventricle has severely reduced systolic function, with an ejection fraction of 25-30%. The cavity size was severely dilated. Left ventricular diastolic Doppler parameters are consistent with pseudonormalization. Elevated mean left atrial  pressure Left ventrical global hypokinesis without regional wall motion abnormalities. Cardiology consulted for management of new onset acute systolic heart failure. Continue with strict intake and output, daily weights and fluid  restriction. Also add Levaquin for possible atypical infection. Nasal cannula oxygen to keep sats greater than 90%. Follow troponins. Added lisinopril 2.5 mg daily and if her blood pressure tolerates we can add a low-dose of Coreg.  Hypertension Well-controlled Continue with the lisinopril 2.5 mg daily.   Hyperlipidemia Get lipid panel in the morning.   GERD Stable continue with Pepcid.   Left-sided breast cancer S/p mastectomy in 2016.   Severity of Illness: The appropriate patient status for this patient is INPATIENT. Inpatient status is judged to be reasonable and necessary in order to provide the required intensity of service to ensure the patient's safety. The patient's presenting symptoms, physical exam findings, and initial radiographic and laboratory data in the context of  their chronic comorbidities is felt to place them at high risk for further clinical deterioration. Furthermore, it is not anticipated that the patient will be medically stable for discharge from the hospital within 2 midnights of admission. The following factors support the patient status of inpatient.   " The patient's presenting symptoms include SOB, pedal edema. . " The worrisome physical exam findings include pedal edema.  " The initial radiographic and laboratory data are worrisome because of vascular congestion.  " The chronic co-morbidities include hypertension.    * I certify that at the point of admission it is my clinical judgment that the patient will require inpatient hospital care spanning beyond 2 midnights from the point of admission due to high intensity of service, high risk for further deterioration and high frequency of surveillance required.*     DVT prophylaxis: Lovenox Code Status: Full code Family Communication: Family at bedside Disposition Plan: Pending clinical improvement and further evaluation by cardiology Consults called: Cardiology consulted Admission status:  Inpatient/telemetry   Hosie Poisson MD [facility] Pager [phone]   If 7PM-7AM, please contact night-coverage [email or web address] Password [email or web address]  02/18/2019, 3:35 PM

## 2019-02-18 NOTE — ED Triage Notes (Signed)
Pt c/o bilat lower extremity for couple weeks. PCP reports if gets worse to go to ED. Pt also reports cough with phlegm. Got tested yesterday at [facility]

## 2019-02-18 NOTE — ED Notes (Signed)
Pt ambulated to RR without assistance. Pt had steady gait and no complaints of dizziness.

## 2019-02-18 NOTE — ED Notes (Signed)
ED TO INPATIENT HANDOFF REPORT  Name/Age/Gender Sherry Bender 68 y.o. female  Code Status    Code Status Orders  (From admission, onward)         Start     Ordered   02/18/19 1315  Full code  Continuous     02/18/19 1314        Code Status History    Date Active Date Inactive Code Status Order ID Comments User Context   04/30/2015 1129 05/02/2015 1341 Full Code [id number]  Fanny Skates, MD Inpatient   06/25/2014 1024 06/26/2014 1407 Full Code [id number]  Eustace Moore, MD Inpatient   06/24/2014 1706 06/25/2014 1024 Full Code [id number]  Eustace Moore, MD Inpatient   06/18/2014 1618 06/19/2014 1412 Full Code [id number]  Eustace Moore, MD Inpatient   Advance Care Planning Activity      Home/SNF/Other Home  Chief Complaint chemo patient/shob  Level of Care/Admitting Diagnosis ED Disposition    ED Disposition Condition [facility] Area: [facility] [[id number]]  Level of Care: Telemetry [5]  Admit to tele based on following criteria: Complex arrhythmia (Bradycardia/Tachycardia)  Admit to tele based on following criteria: Acute CHF  Covid Evaluation: Asymptomatic Screening Protocol (No Symptoms)  Diagnosis: Acute respiratory failure ([place]) [518.81.ICD-9-CM]  Admitting Physician: Hosie Poisson [[id number]]  Attending Physician: Hosie Poisson [phone]  Estimated length of stay: past midnight tomorrow  Certification:: I certify this patient will need inpatient services for at least 2 midnights  PT Class (Do Not Modify): Inpatient [101]  PT Acc Code (Do Not Modify): Private [1]       Medical History Past Medical History:  Diagnosis Date  . Anxiety   . Arthritis    IN BACK....  . Breast cancer ([place]) 2016  . 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 Complication of anesthesia    "HARD TO WAKE UP".......Marland KitchenN/V  . GERD (gastroesophageal reflux disease)   . Hyperlipidemia   . Hypertension   . PONV (postoperative nausea and vomiting)   . Scoliosis   .  Urinary frequency     Allergies Allergies  Allergen Reactions  . Codeine Nausea And Vomiting    IV Location/Drains/Wounds Patient Lines/Drains/Airways Status   Active Line/Drains/Airways    Name:   Placement date:   Placement time:   Site:   Days:   Implanted Port 11/23/14   11/23/14    1210    -   1548   Peripheral IV 02/18/19 Right Antecubital   02/18/19    0944    Antecubital   less than 1   Closed System Drain 1 Left Breast Bulb (JP) 19 Fr.   04/30/15    0846    Breast   1390   Incision (Closed) 06/18/14 Neck Other (Comment)   06/18/14    1421     1706   Incision (Closed) 06/25/14 Neck Other (Comment)   06/25/14    0905     1699   Incision (Closed) 11/23/14 Chest Right   11/23/14    1208     1548   Incision (Closed) 11/23/14 Chest Right   11/23/14    1224     1548   Incision (Closed) 04/30/15 Chest Left   04/30/15    0911     1390   Incision (Closed) 04/30/15 Chest Right   04/30/15    1130     1390          Labs/Imaging  Results for orders placed or performed during the hospital encounter of 02/18/19 (from the past 48 hour(s))  Comprehensive metabolic panel     Status: Abnormal   Collection Time: 02/18/19  9:36 AM  Result Value Ref Range   Sodium 137 135 - 145 mmol/L   Potassium 3.4 (L) 3.5 - 5.1 mmol/L   Chloride 97 (L) 98 - 111 mmol/L   CO2 28 22 - 32 mmol/L   Glucose, Bld 89 70 - 99 mg/dL   BUN 11 8 - 23 mg/dL   Creatinine, Ser 0.31 (L) 0.44 - 1.00 mg/dL   Calcium 9.5 8.9 - 10.3 mg/dL   Total Protein 7.2 6.5 - 8.1 g/dL   Albumin 4.2 3.5 - 5.0 g/dL   AST 34 15 - 41 U/L   ALT 29 0 - 44 U/L   Alkaline Phosphatase 58 38 - 126 U/L   Total Bilirubin 0.6 0.3 - 1.2 mg/dL   GFR calc non Af Amer >60 >60 mL/min   GFR calc Af Amer >60 >60 mL/min   Anion gap 12 5 - 15    Comment: Performed at [facility], [place] [address], [place], [place] [address]  Brain natriuretic peptide     Status: Abnormal   Collection Time: 02/18/19  9:36 AM  Result Value Ref  Range   B Natriuretic Peptide 1,157.3 (H) 0.0 - 100.0 pg/mL    Comment: Performed at [facility], [place] [address], [place], [place] [address]  CBC with Differential     Status: Abnormal   Collection Time: 02/18/19  9:36 AM  Result Value Ref Range   WBC 4.1 4.0 - 10.5 K/uL   RBC 4.37 3.87 - 5.11 MIL/uL   Hemoglobin 13.1 12.0 - 15.0 g/dL   HCT 41.0 36.0 - 46.0 %   MCV 93.8 80.0 - 100.0 fL   MCH 30.0 26.0 - 34.0 pg   MCHC 32.0 30.0 - 36.0 g/dL   RDW 13.4 11.5 - 15.5 %   Platelets 130 (L) 150 - 400 K/uL   nRBC 0.0 0.0 - 0.2 %   Neutrophils Relative % 76 %   Neutro Abs 3.2 1.7 - 7.7 K/uL   Lymphocytes Relative 10 %   Lymphs Abs 0.4 (L) 0.7 - 4.0 K/uL   Monocytes Relative 10 %   Monocytes Absolute 0.4 0.1 - 1.0 K/uL   Eosinophils Relative 2 %   Eosinophils Absolute 0.1 0.0 - 0.5 K/uL   Basophils Relative 1 %   Basophils Absolute 0.0 0.0 - 0.1 K/uL   Immature Granulocytes 1 %   Abs Immature Granulocytes 0.02 0.00 - 0.07 K/uL    Comment: Performed at [facility], [place] [address], [place], [place] [address]  Troponin I (High Sensitivity)     Status: Abnormal   Collection Time: 02/18/19  9:36 AM  Result Value Ref Range   Troponin I (High Sensitivity) 26 (H) <18 ng/L    Comment: (NOTE) Elevated high sensitivity troponin I (hsTnI) values and significant  changes across serial measurements may suggest ACS but many other  chronic and acute conditions are known to elevate hsTnI results.  Refer to the "Links" section for chest pain algorithms and additional  guidance. Performed at [facility], [place] [address], [place], [place] [address]   SARS Coronavirus 2 [facility] order, Performed in [facility] hospital lab) Nasopharyngeal Nasopharyngeal Swab     Status: None   Collection Time: 02/18/19  9:43 AM   Specimen:  Nasopharyngeal Swab  Result Value Ref Range   SARS Coronavirus 2 NEGATIVE NEGATIVE    Comment: (NOTE) If result is  NEGATIVE SARS-CoV-2 target nucleic acids are NOT DETECTED. The SARS-CoV-2 RNA is generally detectable in upper and lower  respiratory specimens during the acute phase of infection. The lowest  concentration of SARS-CoV-2 viral copies this assay can detect is 250  copies / mL. A negative result does not preclude SARS-CoV-2 infection  and should not be used as the sole basis for treatment or other  patient management decisions.  A negative result may occur with  improper specimen collection / handling, submission of specimen other  than nasopharyngeal swab, presence of viral mutation(s) within the  areas targeted by this assay, and inadequate number of viral copies  (<250 copies / mL). A negative result must be combined with clinical  observations, patient history, and epidemiological information. If result is POSITIVE SARS-CoV-2 target nucleic acids are DETECTED. The SARS-CoV-2 RNA is generally detectable in upper and lower  respiratory specimens dur ing the acute phase of infection.  Positive  results are indicative of active infection with SARS-CoV-2.  Clinical  correlation with patient history and other diagnostic information is  necessary to determine patient infection status.  Positive results do  not rule out bacterial infection or co-infection with other viruses. If result is PRESUMPTIVE POSTIVE SARS-CoV-2 nucleic acids MAY BE PRESENT.   A presumptive positive result was obtained on the submitted specimen  and confirmed on repeat testing.  While 2019 novel coronavirus  (SARS-CoV-2) nucleic acids may be present in the submitted sample  additional confirmatory testing may be necessary for epidemiological  and / or clinical management purposes  to differentiate between  SARS-CoV-2 and other Sarbecovirus currently known to infect humans.  If clinically indicated additional testing with an alternate test  methodology [phone]) is advised. The SARS-CoV-2 RNA is generally  detectable  in upper and lower respiratory sp ecimens during the acute  phase of infection. The expected result is Negative. Fact Sheet for Patients:  StrictlyIdeas.no Fact Sheet for Healthcare Providers: BankingDealers.co.za This test is not yet approved or cleared by the [place] FDA and has been authorized for detection and/or diagnosis of SARS-CoV-2 by FDA under an Emergency Use Authorization (EUA).  This EUA will remain in effect (meaning this test can be used) for the duration of the COVID-19 declaration under Section 564(b)(1) of the Act, 21 U.S.C. section 360bbb-3(b)(1), unless the authorization is terminated or revoked sooner. Performed at [facility], [place] [address] [place], [place] [address]   Troponin I (High Sensitivity)     Status: Abnormal   Collection Time: 02/18/19 11:50 AM  Result Value Ref Range   Troponin I (High Sensitivity) 22 (H) <18 ng/L    Comment: (NOTE) Elevated high sensitivity troponin I (hsTnI) values and significant  changes across serial measurements may suggest ACS but many other  chronic and acute conditions are known to elevate hsTnI results.  Refer to the "Links" section for chest pain algorithms and additional  guidance. Performed at [facility], [place] [address], [place], [place] [address]   Troponin I (High Sensitivity)     Status: Abnormal   Collection Time: 02/18/19  2:36 PM  Result Value Ref Range   Troponin I (High Sensitivity) 25 (H) <18 ng/L    Comment: (NOTE) Elevated high sensitivity troponin I (hsTnI) values and significant  changes across serial measurements may suggest ACS but many other  chronic and acute  conditions are known to elevate hsTnI results.  Refer to the "Links" section for chest pain algorithms and additional  guidance. Performed at [facility], [place] [address] [place], [place] [address]    Dg Chest Port 1  View  Result Date: 02/18/2019 CLINICAL DATA:  Dyspnea, lower extremity swelling, productive EXAM: PORTABLE CHEST 1 VIEW COMPARISON:  06/24/2014 FINDINGS: Cardiomegaly. There is a small to moderate left pleural effusion with associated atelectasis or consolidation. The visualized skeletal structures are unremarkable. IMPRESSION: 1. There is a small to moderate left pleural effusion with associated atelectasis or consolidation, concerning for infection or aspiration. 2.  Cardiomegaly. Electronically Signed   By: Eddie Candle M.D.   On: 02/18/2019 10:02    Pending Labs Unresulted Labs (From admission, onward)    Start     Ordered   02/25/19 0500  Creatinine, serum  (enoxaparin (LOVENOX)    CrCl >/= 30 ml/min)  Weekly,   R    Comments: while on enoxaparin therapy    02/18/19 1314   02/19/19 [id number]  Basic metabolic panel  Daily,   R     02/18/19 1314   02/18/19 1315  HIV antibody (Routine Testing)  Once,   STAT     02/18/19 1314          Vitals/Pain Today's Vitals   02/18/19 1130 02/18/19 1330 02/18/19 1500 02/18/19 1530  BP: 140/66 (!) 142/77 (!) 145/84 118/84  Pulse: 83 84 84 87  Resp: (!) 25 (!) 23 (!) 25 (!) 25  Temp:      TempSrc:      SpO2: 95% 95% 97% 98%  PainSc:        Isolation Precautions No active isolations  Medications Medications  sodium chloride (PF) 0.9 % injection (has no administration in time range)  famotidine (PEPCID) tablet 20 mg (has no administration in time range)  fesoterodine (TOVIAZ) tablet 4 mg (has no administration in time range)  sodium chloride flush (NS) 0.9 % injection 3 mL (has no administration in time range)  sodium chloride flush (NS) 0.9 % injection 3 mL (has no administration in time range)  0.9 %  sodium chloride infusion (has no administration in time range)  acetaminophen (TYLENOL) tablet 650 mg (650 mg Oral Given 02/18/19 1509)  ondansetron (ZOFRAN) injection 4 mg (has no administration in time range)  enoxaparin (LOVENOX) injection 40  mg (has no administration in time range)  lisinopril (ZESTRIL) tablet 2.5 mg (has no administration in time range)  levofloxacin (LEVAQUIN) tablet 500 mg (has no administration in time range)    Mobility walks

## 2019-02-19 DIAGNOSIS — I10 Essential (primary) hypertension: Secondary | ICD-10-CM

## 2019-02-19 DIAGNOSIS — E785 Hyperlipidemia, unspecified: Secondary | ICD-10-CM

## 2019-02-19 DIAGNOSIS — I5041 Acute combined systolic (congestive) and diastolic (congestive) heart failure: Secondary | ICD-10-CM

## 2019-02-19 LAB — BASIC METABOLIC PANEL
Anion gap: 10 (ref 5–15)
BUN: 13 mg/dL (ref 8–23)
CO2: 28 mmol/L (ref 22–32)
Calcium: 8.7 mg/dL — ABNORMAL LOW (ref 8.9–10.3)
Chloride: 99 mmol/L (ref 98–111)
Creatinine, Ser: <0.3 mg/dL — ABNORMAL LOW (ref 0.44–1.00)
Glucose, Bld: 91 mg/dL (ref 70–99)
Potassium: 3.2 mmol/L — ABNORMAL LOW (ref 3.5–5.1)
Sodium: 137 mmol/L (ref 135–145)

## 2019-02-19 LAB — HIV ANTIBODY (ROUTINE TESTING W REFLEX): HIV Screen 4th Generation wRfx: NONREACTIVE

## 2019-02-19 MED ORDER — CARVEDILOL 3.125 MG PO TABS
3.1250 mg | ORAL_TABLET | Freq: Two times a day (BID) | ORAL | Status: DC
  Administered 2019-02-19 – 2019-02-21 (×5): 3.125 mg via ORAL
  Filled 2019-02-19 (×5): qty 1

## 2019-02-19 MED ORDER — POTASSIUM CHLORIDE 10 MEQ/100ML IV SOLN
10.0000 meq | INTRAVENOUS | Status: AC
  Administered 2019-02-19 (×3): 10 meq via INTRAVENOUS
  Filled 2019-02-19 (×2): qty 100

## 2019-02-19 MED ORDER — ATORVASTATIN CALCIUM 40 MG PO TABS
80.0000 mg | ORAL_TABLET | Freq: Every day | ORAL | Status: DC
  Administered 2019-02-19 – 2019-02-20 (×2): 80 mg via ORAL
  Filled 2019-02-19 (×2): qty 2

## 2019-02-19 NOTE — Evaluation (Signed)
SLP Cancellation Note  Patient Details Name: Sherry Bender MRN: [id number] DOB: 01-Apr-1951   Cancelled treatment:       Reason Eval/Treat Not Completed: Other (comment)(pt declined to have an evaluation completed, stating she takes her medication with applesauce if large, reviewed esophagram with her,please reorder if indicated)   Macario Golds 02/19/2019, 1:32 PM   Luanna Salk, [facility] SLP [place] Pager [phone] Office [phone]

## 2019-02-19 NOTE — Progress Notes (Signed)
[facility]  PROGRESS NOTE  Sherry Bender [id number] DOB: 1950-07-10 DOA: 02/18/2019 PCP: Sharion Balloon, FNP   Brief HPI:   68 year old female with a history of hypertension, hyperlipidemia, GERD, came to ED with shortness of breath for 2 weeks.  Reported shortness of breath at rest, worse with exertion, orthopnea.  Denies PND.  BNP was elevated 1157.    Subjective   Patient seen and examined, breathing has improved.  [place] cardiology consultation.   Assessment/Plan:     1. Acute respiratory failure-secondary to acute combined systolic and diastolic heart failure.  Started on IV Lasix 40 mg every 12 hours.  Echocardiogram showed EF 25 to 30%, left ventricle diastolic Doppler parameters consistent with pseudonormalization.  Elevated mean left atrial pressure.  Cardiology consulted for new onset acute systolic heart failure.  Will follow cardiology recommendations.  Strict intake and output.  2. ? Community-acquired pneumonia-chest x-ray showed left pleural effusion with consolidation.  Started on Levaquin.  Follow blood culture results.  3. Hypertension-blood pressures controlled, continue lisinopril.  4. Hypokalemia-potassium is 3.2, will replace potassium and check BMP in a.m.  5. GERD-continue Pepcid  6. Left-sided breast cancer-status post mastectomy in 2016.       CBC: Recent Labs  Lab 02/18/19 0936  WBC 4.1  NEUTROABS 3.2  HGB 13.1  HCT 41.0  MCV 93.8  PLT 130*    Basic Metabolic Panel: Recent Labs  Lab 02/18/19 0936 02/19/19 0508  NA 137 137  K 3.4* 3.2*  CL 97* 99  CO2 28 28  GLUCOSE 89 91  BUN 11 13  CREATININE 0.31* <0.30*  CALCIUM 9.5 8.7*     DVT prophylaxis: Lovenox  Code Status: Full code  Family Communication: No family at bedside  Disposition Plan: likely home when medically ready for discharge.  Likely discharge in a.m.   BMI  Estimated body mass index is 18.13 kg/m as calculated from the following:   Height  as of this encounter: 5\' 4"  (1.626 m).   Weight as of this encounter: 47.9 kg.         Scheduled medications:  . atorvastatin  80 mg Oral q1800  . carvedilol  3.125 mg Oral BID WC  . docusate sodium  100 mg Oral Daily  . enoxaparin (LOVENOX) injection  40 mg Subcutaneous Q24H  . fesoterodine  4 mg Oral Daily  . furosemide  40 mg Intravenous Q12H  . levofloxacin  500 mg Oral Daily  . sodium chloride flush  3 mL Intravenous Q12H    Consultants:  Cardiology  Procedures:     Antibiotics:   Anti-infectives (From admission, onward)   Start     Dose/Rate Route Frequency Ordered Stop   02/18/19 2200  amoxicillin-clavulanate (AUGMENTIN) 875-125 MG per tablet 1 tablet  Status:  Discontinued     1 tablet Oral Every 12 hours 02/18/19 1546 02/18/19 1548   02/18/19 1700  levofloxacin (LEVAQUIN) tablet 500 mg     500 mg Oral Daily 02/18/19 1548         Objective   Vitals:   02/18/19 2050 02/19/19 0448 02/19/19 0534 02/19/19 1032  BP: 130/82  134/76 136/79  Pulse: 87  75 87  Resp: 18  18   Temp: 97.8 F (36.6 C)  98 F (36.7 C)   TempSrc: Oral  Oral   SpO2: 97%  94%   Weight:  47.9 kg    Height:        Intake/Output Summary (Last 24 hours) at 02/19/2019  [place] filed at 02/19/2019 1210 Gross per 24 hour  Intake 1520 ml  Output 3250 ml  Net -1730 ml   Filed Weights   02/18/19 1718 02/19/19 0448  Weight: 47 kg 47.9 kg     Physical Examination:    General: Appears in no acute distress  Cardiovascular: S1-S2, regular, no murmur auscultated  Respiratory: Clear to auscultation bilaterally  Abdomen: Abdomen is soft, nontender, no organomegaly  Extremities: No edema in the lower extremities  Neurologic: Alert, oriented x3, no focal deficit noted     Data Reviewed: I have personally reviewed following labs and imaging studies   Recent Results (from the past 240 hour(s))  SARS Coronavirus 2 [facility] order, Performed in [place] hospital lab)  Nasopharyngeal Nasopharyngeal Swab     Status: None   Collection Time: 02/18/19  9:43 AM   Specimen: Nasopharyngeal Swab  Result Value Ref Range Status   SARS Coronavirus 2 NEGATIVE NEGATIVE Final    Comment: (NOTE) If result is NEGATIVE SARS-CoV-2 target nucleic acids are NOT DETECTED. The SARS-CoV-2 RNA is generally detectable in upper and lower  respiratory specimens during the acute phase of infection. The lowest  concentration of SARS-CoV-2 viral copies this assay can detect is 250  copies / mL. A negative result does not preclude SARS-CoV-2 infection  and should not be used as the sole basis for treatment or other  patient management decisions.  A negative result may occur with  improper specimen collection / handling, submission of specimen other  than nasopharyngeal swab, presence of viral mutation(s) within the  areas targeted by this assay, and inadequate number of viral copies  (<250 copies / mL). A negative result must be combined with clinical  observations, patient history, and epidemiological information. If result is POSITIVE SARS-CoV-2 target nucleic acids are DETECTED. The SARS-CoV-2 RNA is generally detectable in upper and lower  respiratory specimens dur ing the acute phase of infection.  Positive  results are indicative of active infection with SARS-CoV-2.  Clinical  correlation with patient history and other diagnostic information is  necessary to determine patient infection status.  Positive results do  not rule out bacterial infection or co-infection with other viruses. If result is PRESUMPTIVE POSTIVE SARS-CoV-2 nucleic acids MAY BE PRESENT.   A presumptive positive result was obtained on the submitted specimen  and confirmed on repeat testing.  While 2019 novel coronavirus  (SARS-CoV-2) nucleic acids may be present in the submitted sample  additional confirmatory testing may be necessary for epidemiological  and / or clinical management purposes  to  differentiate between  SARS-CoV-2 and other Sarbecovirus currently known to infect humans.  If clinically indicated additional testing with an alternate test  methodology [phone]) is advised. The SARS-CoV-2 RNA is generally  detectable in upper and lower respiratory sp ecimens during the acute  phase of infection. The expected result is Negative. Fact Sheet for Patients:  StrictlyIdeas.no Fact Sheet for Healthcare Providers: BankingDealers.co.za This test is not yet approved or cleared by the [place] FDA and has been authorized for detection and/or diagnosis of SARS-CoV-2 by FDA under an Emergency Use Authorization (EUA).  This EUA will remain in effect (meaning this test can be used) for the duration of the COVID-19 declaration under Section 564(b)(1) of the Act, 21 U.S.C. section 360bbb-3(b)(1), unless the authorization is terminated or revoked sooner. Performed at [facility], [place] [address], [place], [place] [address]      Liver Function Tests: Recent Labs  Lab 02/18/19 0936  AST 34  ALT 29  ALKPHOS 58  BILITOT 0.6  PROT 7.2  ALBUMIN 4.2   No results for input(s): LIPASE, AMYLASE in the last 168 hours. No results for input(s): AMMONIA in the last 168 hours.  Cardiac Enzymes: No results for input(s): CKTOTAL, CKMB, CKMBINDEX, TROPONINI in the last 168 hours. BNP (last 3 results) Recent Labs    02/18/19 0936  BNP 1,157.3*    ProBNP (last 3 results) No results for input(s): PROBNP in the last 8760 hours.    Studies: Dg Chest Port 1 View  Result Date: 02/18/2019 CLINICAL DATA:  Dyspnea, lower extremity swelling, productive EXAM: PORTABLE CHEST 1 VIEW COMPARISON:  06/24/2014 FINDINGS: Cardiomegaly. There is a small to moderate left pleural effusion with associated atelectasis or consolidation. The visualized skeletal structures are unremarkable. IMPRESSION: 1. There is a small to moderate left  pleural effusion with associated atelectasis or consolidation, concerning for infection or aspiration. 2.  Cardiomegaly. Electronically Signed   By: Eddie Candle M.D.   On: 02/18/2019 10:02     Admission status: Inpatient: Based on patients clinical presentation and evaluation of above clinical data, I have made determination that patient meets Inpatient criteria at this time.  Time spent: [facility] Pager [phone]. If 7PM-7AM, please contact night-coverage at [email or web address], Office  [phone]  password [email or web address]  02/19/2019, 2:31 PM  LOS: 1 day

## 2019-02-19 NOTE — TOC Initial Note (Signed)
Transition of Care [facility]) - Initial/Assessment Note    Patient Details  Name: Sherry Bender MRN: [id number] Date of Birth: April 09, 1951  Transition of Care [facility]) CM/SW Contact:    Dessa Phi, RN Phone Number: 02/19/2019, 1:03 PM  Clinical Narrative: Referral for Heart failure screen-1 adm/6 months-not appropriate for Heart failure protocal. No CM needs.                  Expected Discharge Plan: Home/Self Care Barriers to Discharge: Continued Medical Work up   Patient Goals and CMS Choice Patient states their goals for this hospitalization and ongoing recovery are:: go home CMS Medicare.gov Compare Post Acute Care list provided to:: Patient    Expected Discharge Plan and Services Expected Discharge Plan: Home/Self Care   Discharge Planning Services: CM Consult     Expected Discharge Date: (unknown)                                    Prior Living Arrangements/Services     Patient language and need for interpreter reviewed:: Yes Do you feel safe going back to the place where you live?: Yes      Need for Family Participation in Patient Care: No (Comment) Care giver support system in place?: Yes (comment)   Criminal Activity/Legal Involvement Pertinent to Current Situation/Hospitalization: No - Comment as needed  Activities of Daily Living Home Assistive Devices/Equipment: Eyeglasses ADL Screening (condition at time of admission) Patient's cognitive ability adequate to safely complete daily activities?: Yes Is the patient deaf or have difficulty hearing?: No Does the patient have difficulty seeing, even when wearing glasses/contacts?: No Does the patient have difficulty concentrating, remembering, or making decisions?: No Patient able to express need for assistance with ADLs?: Yes Does the patient have difficulty dressing or bathing?: No Independently performs ADLs?: Yes (appropriate for developmental age) Does the patient have difficulty walking or climbing  stairs?: Yes(secondary to shortness of breath and leg swelling) Weakness of Legs: Both(peripheral neuropathy in feet) Weakness of Arms/Hands: None  Permission Sought/Granted Permission sought to share information with : Case Manager Permission granted to share information with : Yes, Verbal Permission Granted              Emotional Assessment Appearance:: Appears stated age Attitude/Demeanor/Rapport: Gracious Affect (typically observed): Accepting Orientation: : Oriented to Self, Oriented to Place, Oriented to  Time, Oriented to Situation Alcohol / Substance Use: Never Used Psych Involvement: No (comment)  Admission diagnosis:  Peripheral edema [R60.9] Dyspnea, unspecified type [R06.00] Patient Active Problem List   Diagnosis Date Noted  . Acute respiratory failure ([place]) 02/18/2019  . Impingement syndrome of left shoulder 01/02/2019  . Closed fracture of head of left humerus 06/01/2017  . Underweight 12/25/2016  . Loss of weight 07/03/2016  . History of esophageal stricture 07/03/2016  . Dysphagia 06/08/2016  . Hypersensitivity reaction 12/02/2015  . Iron deficiency anemia 11/24/2015  . Breast cancer of upper-outer quadrant of left female breast ([place]) 11/11/2014  . S/P cervical spinal fusion 06/24/2014  . HTN (hypertension) 12/06/2010  . Tobacco abuse 12/06/2010  . Hyperlipidemia 09/26/2010  . Nodular goiter 09/26/2010  . GERD (gastroesophageal reflux disease) 09/26/2010  . Degenerative disc disease 09/26/2010  . Allergic rhinitis 09/26/2010  . Anxiety disorder 09/26/2010  . Cyst, breast 09/26/2010   PCP:  Sharion Balloon, FNP Pharmacy:   [place]  [place] [address] Phone: [phone] Fax: [phone]     Social Determinants of Health (SDOH) Interventions    Readmission Risk Interventions No flowsheet data found.

## 2019-02-19 NOTE — Progress Notes (Signed)
PT Cancellation Note  Patient Details Name: Sherry Bender MRN: [id number] DOB: [birth year]   Cancelled Treatment:    Reason Eval/Treat Not Completed: PT screened, no needs identified, will sign off, independent.   Claretha Cooper 02/19/2019, 11:21 AM  [place] Pager [phone] Office [phone]

## 2019-02-19 NOTE — Progress Notes (Signed)
OT Cancellation Note  Patient Details Name: Sherry Bender MRN: [id number] DOB: Aug 07, 1950   Cancelled Treatment:    Reason Eval/Treat Not Completed: OT screened, no needs identified, will sign off. Pt declines need for therapy; refused CT earlier per RN. Will sign off.  Lorie Cleckley 02/19/2019, 11:10 AM  Lesle Chris, OTR/L Acute Rehabilitation Services [phone] [facility] pager [phone] office 02/19/2019

## 2019-02-19 NOTE — Progress Notes (Signed)
OT Cancellation Note  Patient Details Name: Sherry Bender MRN: [id number] DOB: 07/22/1950   Cancelled Treatment:    Reason Eval/Treat Not Completed: Medical issues which prohibited therapy.  Awaiting stat CT angio.  Damione Robideau 02/19/2019, 9:58 AM  Lesle Chris, OTR/L Acute Rehabilitation Services [phone] [facility] pager [phone] office 02/19/2019

## 2019-02-19 NOTE — Plan of Care (Signed)
Pts. Condition will continue to improve 

## 2019-02-19 NOTE — Consult Note (Addendum)
Cardiology Consultation:   Patient ID: Sherry Bender; [id number]; [birth year]   Admit date: 02/18/2019 Date of Consult: 02/19/2019  Primary Care Provider: Sharion Balloon, FNP Primary Cardiologist: Sherry Bender  Patient Profile:   Sherry Bender is a 68 y.o. female with a hx of hyperlipidemia, breast cancer s/p mastectomy and radiaiton 2016 and GERD who is being seen today for the evaluation of Sherry Bender acute systolic and diastolic CHF/cardiomyopathy at the request of Sherry Bender.  History of Present Illness:   Sherry Bender is a 68 year old female with a history stated above who presented to [facility] on 02/18/2019 with progressively worsening shortness of breath and bilateral lower extremity edema for the last 2 weeks. She reports that that several weeks ago, she woke up approximately 2am with a productive cough with white phlem production. Since that time, she has noticed LW swelling and increasing fatigue. Initially she reports that the SOB was exertional and has most recently progressed to rest as well.  She reports some orthopnea. She denies anginal symptoms, dizziness, palpitations or syncope. She denies recent fever, chills or COVID encounters. She has no personal hx of CAD. Her mother had "valve surgery" but it is unknown if she also had CAD. She denies tobacco, alcohol or illicit drug use. She has a hx of breast cancer dating back to 20169 in which she underwent radiation to her chest 5 days/week for 6 weeks.   In the ED, BNP was noted to be elevated at 1157.  High-sensitivity troponin was nonacute at 26> 22> 25, not consistent with ACS.  COVID testing was negative.  CXR with small to moderate left pleural effusions with associated atelectasis and/or consolidation concerning for infection or aspiration.  She was started on IV Lasix 40 mg twice daily.  Echocardiogram performed showed severely reduced systolic function with an LVEF of 25 to 30% and left ventricular global hypokinesis without regional wall motion  abnormality.  Given the above, she was admitted to the hospitalist service with cardiology consultation requested for the evaluation of CHF.  Past Medical History:  Diagnosis Date  . Anxiety   . Arthritis    IN BACK....  . Breast cancer ([place]) 2016  . 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 Complication of anesthesia    "HARD TO WAKE UP".......Marland KitchenN/V  . GERD (gastroesophageal reflux disease)   . Hyperlipidemia   . Hypertension   . PONV (postoperative nausea and vomiting)   . Scoliosis   . Urinary frequency     Past Surgical History:  Procedure Laterality Date  . ABDOMINAL HYSTERECTOMY  2000   TAH/BSO  . ANTERIOR CERVICAL DECOMP/DISCECTOMY FUSION N/A 06/18/2014   Procedure: C4-5 C5-6 C6-7 ANTERIOR CERVICAL DECOMPRESSION/DISKECTOMY/FUSION;  Surgeon: Eustace Moore, MD;  Location: [facility];  Service: Neurosurgery;  Laterality: N/A;  C4-5 C5-6 C6-7 ANTERIOR CERVICAL DECOMPRESSION/DISKECTOMY/FUSION  . ANTERIOR CERVICAL DECOMP/DISCECTOMY FUSION N/A 06/25/2014   Procedure: EXPLORATION OF CERVICAL FUSION WITH REVIOSN OF HARDWARE;  Surgeon: Eustace Moore, MD;  Location: [facility];  Service: Neurosurgery;  Laterality: N/A;  . CERVICAL FUSION    . CHOLECYSTECTOMY  1994  . COMPLETE MASTECTOMY W/ SENTINEL NODE BIOPSY Left 04/30/2015  . FOOT NEUROMA SURGERY  01/2010   also had metatarasl shortened  . KNEE ARTHROSCOPY  2005   right knee  . MASTECTOMY Left 2016  . PORT-A-CATH REMOVAL Right 04/30/2015   Procedure: REMOVAL PORT-A-CATH;  Surgeon: Fanny Skates, MD;  Location: [facility];  Service: General;  Laterality: Right;  .  PORTACATH PLACEMENT N/A 11/23/2014   Procedure: INSERTION PORT-A-CATH WITH ULTRASOUND;  Surgeon: Fanny Skates, MD;  Location: [facility];  Service: General;  Laterality: N/A;  . RHINOPLASTY    . SIMPLE MASTECTOMY WITH AXILLARY SENTINEL NODE BIOPSY Left 04/30/2015   Procedure: LEFT TOTAL  MASTECTOMY WITH LEFT AXILLARY SENTINEL NODE BIOPSY;  Surgeon: Fanny Skates, MD;  Location: [facility];  Service: General;  Laterality: Left;     Prior to Admission medications   Medication Sig Start Date End Date Taking? Authorizing Provider  DEXILANT 60 MG capsule Take 1 capsule (60 mg total) by mouth daily. 10/31/18  Yes Nicholas Lose, MD  estradiol (ESTRACE) 1 MG tablet TAKE 1 TABLET BY MOUTH EVERY DAY Patient taking differently: Take 1 mg by mouth daily.  08/07/18  Yes Dettinger, Fransisca Kaufmann, MD  famotidine (PEPCID) 20 MG tablet Take 20 mg by mouth 2 (two) times daily as needed for heartburn or indigestion.   Yes [provider]  lidocaine (LIDODERM) 5 % Place 1 patch onto the skin daily. 12/26/18  Yes Hawks, Christy A, FNP  polyvinyl alcohol (LIQUIFILM TEARS) 1.4 % ophthalmic solution Place 1 drop into both eyes as needed for dry eyes.   Yes [provider]  tolterodine (DETROL LA) 2 MG 24 hr capsule Take 1 capsule (2 mg total) by mouth daily. 12/26/18  Yes Hawks, Christy A, FNP  amoxicillin-clavulanate (AUGMENTIN) 875-125 MG tablet Take 1 tablet by mouth 2 (two) times daily. 02/12/19   Evelina Dun A, FNP  docusate sodium (COLACE) 250 MG capsule Take 1 capsule (250 mg total) by mouth daily. Patient not taking: Reported on 02/18/2019 05/14/17   Duffy Bruce, MD    Inpatient Medications: Scheduled Meds: . docusate sodium  100 mg Oral Daily  . enoxaparin (LOVENOX) injection  40 mg Subcutaneous Q24H  . fesoterodine  4 mg Oral Daily  . furosemide  40 mg Intravenous Q12H  . levofloxacin  500 mg Oral Daily  . lisinopril  2.5 mg Oral Daily  . sodium chloride flush  3 mL Intravenous Q12H   Continuous Infusions: . sodium chloride     PRN Meds: sodium chloride, acetaminophen, diphenhydrAMINE, famotidine, Melatonin, ondansetron (ZOFRAN) IV, polyethylene glycol, sodium chloride flush  Allergies:    Allergies  Allergen Reactions  . Codeine Nausea And Vomiting    Social History:   Social History   Socioeconomic History  . Marital status:  Married    Spouse name: Not on file  . Number of children: 2  . Years of education: Not on file  . Highest education level: Not on file  Occupational History  . Not on file  Social Needs  . Financial resource strain: Not on file  . Food insecurity    Worry: Not on file    Inability: Not on file  . Transportation needs    Medical: Not on file    Non-medical: Not on file  Tobacco Use  . Smoking status: Former Smoker    Packs/day: 0.25    Years: 42.00    Pack years: 10.50    Types: Cigarettes    Quit date: 01/24/2017    Years since quitting: 2.0  . Smokeless tobacco: Never Used  Substance and Sexual Activity  . Alcohol use: No  . Drug use: No  . Sexual activity: Never  Lifestyle  . Physical activity    Days per week: Not on file    Minutes per session: Not on file  . Stress: Not on  file  Relationships  . Social Herbalist on phone: Not on file    Gets together: Not on file    Attends religious service: Not on file    Active member of club or organization: Not on file    Attends meetings of clubs or organizations: Not on file    Relationship status: Not on file  . Intimate partner violence    Fear of current or ex partner: Not on file    Emotionally abused: Not on file    Physically abused: Not on file    Forced sexual activity: Not on file  Other Topics Concern  . 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     Two daughters born 38 and 70- one in [place] one in [place].              Family History:   Family History  Problem Relation Age of Onset  . Heart disease Mother   . Hypertension Mother   . Diabetes Mother   . Heart disease Father   . Hypertension Father   . Cancer Maternal Grandmother        ? ovarian cancer vs. cervical  . Cancer Paternal Aunt        ? breast   Family Status:  Family Status   Relation Name Status  . Mother  Deceased       ? heart   . Father  Deceased       embolism  . MGM  Deceased  . Sister x5 Alive  . Brother  Deceased at age 76  . MGF  Deceased  . PGM  Deceased  . PGF  Deceased  . Brother x4 Alive  . Ethlyn Daniels  (Not Specified)    ROS:  Please see the history of present illness.  All other ROS reviewed and negative.     Physical Exam/Data:   Vitals:   02/18/19 1718 02/18/19 2050 02/19/19 0448 02/19/19 0534  BP: (!) 153/93 130/82  134/76  Pulse: 90 87  75  Resp: 18 18  18   Temp: 98.3 F (36.8 C) 97.8 F (36.6 C)  98 F (36.7 C)  TempSrc: Oral Oral  Oral  SpO2: 97% 97%  94%  Weight: 47 kg  47.9 kg   Height: 5\' 4"  (1.626 m)       Intake/Output Summary (Last 24 hours) at 02/19/2019 0756 Last data filed at 02/19/2019 0449 Gross per 24 hour  Intake 600 ml  Output 2350 ml  Net -1750 ml   Filed Weights   02/18/19 1718 02/19/19 0448  Weight: 47 kg 47.9 kg   Body mass index is 18.13 kg/m.   General: Well developed, well nourished, NAD Neck: Negative for carotid bruits. No JVD Lungs: Diminished bilaterally. No wheezes, rales, or rhonchi. Breathing is unlabored. Cardiovascular: RRR with S1 S2. No murmur Abdomen: Soft, non-tender, non-distended. No obvious abdominal masses. Extremities: 1+ BLE edema. No clubbing or cyanosis. DP pulses 1+ bilaterally Neuro: Alert and oriented. No focal deficits. No facial asymmetry. MAE spontaneously. Psych: Responds to questions appropriately with normal affect.     EKG:  The EKG was personally reviewed and demonstrates:  02/18/2019 NSR with evidence of right atrial enlargement, no acute ischemic changes, HR 96bpm Telemetry:  Telemetry was personally reviewed and demonstrates: 02/19/2019  NSR with HR 90's   Relevant CV Studies:  Echocardiogram 02/18/2019:  1. The left ventricle has severely reduced systolic function, with an ejection fraction of 25-30%. The cavity size was severely dilated. Left  ventricular diastolic Doppler parameters are consistent with pseudonormalization. Elevated mean left atrial  pressure Left ventrical global hypokinesis without regional wall motion abnormalities.  2. The right ventricle has normal systolic function. The cavity was normal. There is no increase in right ventricular wall thickness. Right ventricular systolic pressure could not be assessed.  3. Left atrial size was mildly dilated.  4. Trivial pericardial effusion is present.  5. The aorta is normal unless otherwise noted.  6. The inferior vena cava was dilated in size with >50% respiratory variability.  7. No intracardiac thrombi or masses were visualized.  8. When compared to the prior study: there is marked left ventricular dilation and deterioration in left ventricular systolic function since [id number].  Echocardiogram 11/25/2014:  - Left ventricle: The cavity size was normal. Systolic function was   normal. The estimated ejection fraction was in the range of 55%   to 60%. Wall motion was normal; there were no regional wall   motion abnormalities. Doppler parameters are consistent with   abnormal left ventricular relaxation (grade 1 diastolic   dysfunction). There was no evidence of elevated ventricular   filling pressure by Doppler parameters. - Aortic valve: Trileaflet; normal thickness leaflets. There was no   regurgitation. - Aortic root: The aortic root was normal in size. - Left atrium: The atrium was normal in size. - Right ventricle: Systolic function was normal. - Right atrium: The atrium was normal in size. - Tricuspid valve: There was no regurgitation. - Pulmonic valve: Structurally normal valve. - Pulmonary arteries: Systolic pressure was within the normal   range. - Inferior vena cava: The vessel was normal in size. - Pericardium, extracardiac: There was no pericardial effusion.  Impressions:  - Normal strain parameters: Global longitudinal strain: -18.2%,   lateral S prime  8.4 cm/sec.  Laboratory Data:  Chemistry Recent Labs  Lab 02/18/19 0936 02/19/19 0508  NA 137 137  K 3.4* 3.2*  CL 97* 99  CO2 28 28  GLUCOSE 89 91  BUN 11 13  CREATININE 0.31* <0.30*  CALCIUM 9.5 8.7*  GFRNONAA >60 NOT CALCULATED  GFRAA >60 NOT CALCULATED  ANIONGAP 12 10    Total Protein  Date Value Ref Range Status  02/18/2019 7.2 6.5 - 8.1 g/dL Final  12/26/2018 7.0 6.0 - 8.5 g/dL Final  01/24/2017 7.1 6.4 - 8.3 g/dL Final   Albumin  Date Value Ref Range Status  02/18/2019 4.2 3.5 - 5.0 g/dL Final  12/26/2018 4.7 3.8 - 4.8 g/dL Final  01/24/2017 4.0 3.5 - 5.0 g/dL Final   AST  Date Value Ref Range Status  02/18/2019 34 15 - 41 U/L Final  01/24/2017 27 5 - 34 U/L Final   ALT  Date Value Ref Range Status  02/18/2019 29 0 - 44 U/L Final  01/24/2017 22 0 - 55 U/L Final   Alkaline Phosphatase  Date Value Ref Range Status  02/18/2019 58 38 - 126 U/L Final  01/24/2017 54 40 - 150 U/L Final   Total Bilirubin  Date Value Ref Range Status  02/18/2019 0.6 0.3 - 1.2 mg/dL Final  01/24/2017 0.33 0.20 - 1.20 mg/dL Final   Bilirubin Total  Date Value Ref Range Status  12/26/2018 0.3 0.0 - 1.2 mg/dL Final   Hematology Recent Labs  Lab 02/18/19  0936  WBC 4.1  RBC 4.37  HGB 13.1  HCT 41.0  MCV 93.8  MCH 30.0  MCHC 32.0  RDW 13.4  PLT 130*   Cardiac EnzymesNo results for input(s): TROPONINI in the last 168 hours. No results for input(s): TROPIPOC in the last 168 hours.  BNP Recent Labs  Lab 02/18/19 0936  BNP 1,157.3*    DDimer No results for input(s): DDIMER in the last 168 hours. TSH:  Lab Results  Component Value Date   TSH 1.510 12/26/2018   Lipids: Lab Results  Component Value Date   CHOL 200 (H) 12/26/2018   HDL 93 12/26/2018   LDLCALC 90 12/26/2018   TRIG 83 12/26/2018   CHOLHDL 2.2 12/26/2018   HgbA1c:No results found for: HGBA1C  Radiology/Studies:  Dg Chest Port 1 View  Result Date: 02/18/2019 CLINICAL DATA:  Dyspnea, lower  extremity swelling, productive EXAM: PORTABLE CHEST 1 VIEW COMPARISON:  06/24/2014 FINDINGS: Cardiomegaly. There is a small to moderate left pleural effusion with associated atelectasis or consolidation. The visualized skeletal structures are unremarkable. IMPRESSION: 1. There is a small to moderate left pleural effusion with associated atelectasis or consolidation, concerning for infection or aspiration. 2.  Cardiomegaly. Electronically Signed   By: Eddie Candle M.D.   On: 02/18/2019 10:02   Assessment and Plan:   1.  Severe acute systolic and diastolic CHF exacerbation: -Patient presented to [facility] with a 2-week period of progressive shortness of breath, bilateral lower extremity edema and fatigue.   -BNP on presentation 1157 -HsT, 26> 22> 25, not consistent with acute coronary syndrome likely in the setting of acute volume overload and Sherry Bender systolic CHF -CXR with small to moderate left pleural effusion with associated atelectasis or consolidation, concerning for infection or aspiration with cardiomegaly -She was initially treated with IV Lasix 40 mg twice daily with adequate diuresis -Echocardiogram performed showed severely reduced systolic function with an LVEF of 25 to 30% with left ventricular global hypokinesis without regional wall motion abnormalities. -Weight, 105.6lb  -I&O, net negative 2.4L since admission>>reports breathing has improved  -Patient placed on low-dose lisinopril 2.5 mg>>> will discontinue for now for ACEI washout in anticipation of Entresto initiation if BP and renal function allow -Start low-dose carvedilol 3.125 mg and ASA 81 -Anticipate starting spironolactone 12.5 mg daily if BP tolerates at follow up  -BP, 134/76, 130/82, 153/93 -Will need further ischemic evaluation with coronary CT versus cardiac catheterization to determine etiology of cardiomyopathy. Pt denies anginal symptoms. Has chronic back pain with scoliosis>>>concerned about laying flat with further testing.  Less concerned about ACS etiology. Likely in the setting of chest radiation from 2016?   2.  HLD: -Last LDL 04/28/2019, 90 with a goal of <70 -Start atorvastatin 80 mg -Obtain lipid panel, LFTs in 6 to 8 weeks after discharge  3. HTN: -Stable, 134/76>130/82>153/93 -Not on home antihypertensives -Regimen as described above   4. Breast CA s/p mastectomy and radiation: -Pt reports hx of breast cancer with intense radiation to her chest in 2016   For questions or updates, please contact [place] Please consult [email or web address] for contact info under Cardiology/STEMI.   Lyndel Safe NP-C [facility] Pager: [phone] 02/19/2019 7:56 AM   Patient seen and examined.  Agree with above documentation.  Ms. Bolus is a 68 year old female with a history of breast cancer status post chemo radiation, hyperlipidemia, tobacco use who presents with acute systolic heart failure.  Reported symptoms started over the last 2 to 3 weeks.  Describes shortness of breath,  lower extremity edema, cough.  She presented to the ED yesterday.  BNP was noted to be 1157.  High-sensitivity troponin was unremarkable.  EKG personally reviewed and shows sinus rhythm, rate 96, left axis deviation, septal Q waves and poor R wave progression.  Telemetry personally reviewed shows sinus rhythm with rate 80s to 90s, occasional PVCs.  Echocardiogram was done and shows EF 25 to 30% with global hypokinesis.  LV is severely dilated.  For Sherry Bender diagnosis heart failure with reduced ejection fraction, will continue diuresis with IV Lasix.  She was started on low-dose lisinopril.  Would discontinue, in anticipation of transitioning to [facility] following a 36-hour washout.  Will start carvedilol 3.125 mg.  Also plan to start spironolactone 12.5 mg daily if blood pressure tolerates.  She will require an ischemic work-up at some point.  Does not report any anginal symptoms, but does have risk factors with a significant smoking history.

## 2019-02-20 ENCOUNTER — Ambulatory Visit: Payer: 59 | Admitting: Family

## 2019-02-20 DIAGNOSIS — I5021 Acute systolic (congestive) heart failure: Secondary | ICD-10-CM

## 2019-02-20 LAB — BASIC METABOLIC PANEL
Anion gap: 11 (ref 5–15)
BUN: 16 mg/dL (ref 8–23)
CO2: 25 mmol/L (ref 22–32)
Calcium: 8.8 mg/dL — ABNORMAL LOW (ref 8.9–10.3)
Chloride: 97 mmol/L — ABNORMAL LOW (ref 98–111)
Creatinine, Ser: 0.3 mg/dL — ABNORMAL LOW (ref 0.44–1.00)
GFR calc Af Amer: >60 mL/min (ref >60)
GFR calc non Af Amer: >60 mL/min (ref >60)
Glucose, Bld: 95 mg/dL (ref 70–99)
Potassium: 3.6 mmol/L (ref 3.5–5.1)
Sodium: 133 mmol/L — ABNORMAL LOW (ref 135–145)

## 2019-02-20 MED ORDER — SPIRONOLACTONE 12.5 MG HALF TABLET
12.5000 mg | ORAL_TABLET | Freq: Every day | ORAL | Status: DC
  Administered 2019-02-20 – 2019-02-21 (×2): 12.5 mg via ORAL
  Filled 2019-02-20 (×2): qty 1

## 2019-02-20 MED ORDER — FUROSEMIDE 40 MG PO TABS
40.0000 mg | ORAL_TABLET | Freq: Every day | ORAL | Status: DC
  Administered 2019-02-21: 40 mg via ORAL
  Filled 2019-02-20: qty 1

## 2019-02-20 NOTE — Progress Notes (Signed)
Progress Note  Patient Name: Sherry Bender Date of Encounter: 02/20/2019  Primary Cardiologist: New to [place] well today. Eating breakfast. Denies chest pain or SOB. Has lots of questions about coronary CT>>answered   Inpatient Medications    Scheduled Meds: . atorvastatin  80 mg Oral q1800  . carvedilol  3.125 mg Oral BID WC  . docusate sodium  100 mg Oral Daily  . enoxaparin (LOVENOX) injection  40 mg Subcutaneous Q24H  . fesoterodine  4 mg Oral Daily  . furosemide  40 mg Intravenous Q12H  . levofloxacin  500 mg Oral Daily  . sodium chloride flush  3 mL Intravenous Q12H   Continuous Infusions: . sodium chloride     PRN Meds: sodium chloride, acetaminophen, diphenhydrAMINE, famotidine, Melatonin, ondansetron (ZOFRAN) IV, polyethylene glycol, sodium chloride flush   Vital Signs    Vitals:   02/19/19 2113 02/20/19 0500 02/20/19 0512 02/20/19 0744  BP: 123/72  (!) 144/78 (!) 142/90  Pulse: 79  73 77  Resp: 18  18   Temp: 97.9 F (36.6 C)  97.8 F (36.6 C)   TempSrc: Oral  Oral   SpO2: 95%  93%   Weight:  47.7 kg    Height:        Intake/Output Summary (Last 24 hours) at 02/20/2019 0748 Last data filed at 02/20/2019 [id number] Gross per 24 hour  Intake 1840.06 ml  Output 1900 ml  Net -59.94 ml   Filed Weights   02/18/19 1718 02/19/19 0448 02/20/19 0500  Weight: 47 kg 47.9 kg 47.7 kg    Physical Exam   General: Frail, NAD Lungs:Clear to ausculation bilaterally. No wheezes, rales, or rhonchi. Breathing is unlabored. Cardiovascular: RRR with S1 S2. No murmur Abdomen: Soft, non-tender, non-distended. No obvious abdominal masses. Extremities: No edema. No clubbing or cyanosis. DP pulses 2+ bilaterally Neuro: Alert and oriented. No focal deficits. No facial asymmetry. MAE spontaneously. Psych: Responds to questions appropriately with normal affect.    Labs    Chemistry Recent Labs  Lab 02/18/19 0936 02/19/19 0508 02/20/19 0519  NA 137  137 133*  K 3.4* 3.2* 3.6  CL 97* 99 97*  CO2 28 28 25   GLUCOSE 89 91 95  BUN 11 13 16   CREATININE 0.31* <0.30* 0.30*  CALCIUM 9.5 8.7* 8.8*  PROT 7.2  --   --   ALBUMIN 4.2  --   --   AST 34  --   --   ALT 29  --   --   ALKPHOS 58  --   --   BILITOT 0.6  --   --   GFRNONAA >60 NOT CALCULATED >60  GFRAA >60 NOT CALCULATED >60  ANIONGAP 12 10 11      Hematology Recent Labs  Lab 02/18/19 0936  WBC 4.1  RBC 4.37  HGB 13.1  HCT 41.0  MCV 93.8  MCH 30.0  MCHC 32.0  RDW 13.4  PLT 130*    Cardiac EnzymesNo results for input(s): TROPONINI in the last 168 hours. No results for input(s): TROPIPOC in the last 168 hours.   BNP Recent Labs  Lab 02/18/19 0936  BNP 1,157.3*     DDimer No results for input(s): DDIMER in the last 168 hours.   Radiology    Dg Chest Port 1 View  Result Date: 02/18/2019 CLINICAL DATA:  Dyspnea, lower extremity swelling, productive EXAM: PORTABLE CHEST 1 VIEW COMPARISON:  06/24/2014 FINDINGS: Cardiomegaly. There is a small to  moderate left pleural effusion with associated atelectasis or consolidation. The visualized skeletal structures are unremarkable. IMPRESSION: 1. There is a small to moderate left pleural effusion with associated atelectasis or consolidation, concerning for infection or aspiration. 2.  Cardiomegaly. Electronically Signed   By: Eddie Candle M.D.   On: 02/18/2019 10:02   Telemetry    02/20/2019 NSR - Personally Reviewed  ECG    No new tracing as of 02/20/2019- Personally Reviewed  Cardiac Studies   Echocardiogram 02/18/2019:  1. The left ventricle has severely reduced systolic function, with an ejection fraction of 25-30%. The cavity size was severely dilated. Left ventricular diastolic Doppler parameters are consistent with pseudonormalization. Elevated mean left atrial  pressure Left ventrical global hypokinesis without regional wall motion abnormalities. 2. The right ventricle has normal systolic function. The cavity  was normal. There is no increase in right ventricular wall thickness. Right ventricular systolic pressure could not be assessed. 3. Left atrial size was mildly dilated. 4. Trivial pericardial effusion is present. 5. The aorta is normal unless otherwise noted. 6. The inferior vena cava was dilated in size with >50% respiratory variability. 7. No intracardiac thrombi or masses were visualized. 8. When compared to the prior study: there is marked left ventricular dilation and deterioration in left ventricular systolic function since Q000111Q.  Echocardiogram 11/25/2014:  - Left ventricle: The cavity size was normal. Systolic function was normal. The estimated ejection fraction was in the range of 55% to 60%. Wall motion was normal; there were no regional wall motion abnormalities. Doppler parameters are consistent with abnormal left ventricular relaxation (grade 1 diastolic dysfunction). There was no evidence of elevated ventricular filling pressure by Doppler parameters. - Aortic valve: Trileaflet; normal thickness leaflets. There was no regurgitation. - Aortic root: The aortic root was normal in size. - Left atrium: The atrium was normal in size. - Right ventricle: Systolic function was normal. - Right atrium: The atrium was normal in size. - Tricuspid valve: There was no regurgitation. - Pulmonic valve: Structurally normal valve. - Pulmonary arteries: Systolic pressure was within the normal range. - Inferior vena cava: The vessel was normal in size. - Pericardium, extracardiac: There was no pericardial effusion.  Impressions:  - Normal strain parameters: Global longitudinal strain: -18.2%, lateral S prime 8.4 cm/sec.  Patient Profile     68 y.o. female with a hx of hyperlipidemia, breast cancer s/p mastectomy and radiaiton 2016 and GERD who is being seen today for the evaluation of new acute systolic and diastolic CHF/cardiomyopathy at the request of Dr.  Karleen Hampshire.  Assessment & Plan    1.  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 -Weight, 105.6lb  -I&O, net negative 1.5L since admission>>reports breathing has improved  -Patient placed on low-dose lisinopril 2.5 mg>>>discontinued>>needs 36H washout in anticipation of Entresto initiation 09/04 -Creatinine stable at 0.30 -Start low-dose carvedilol 3.125 mg and ASA 81 -Will add spironolactone 12.5 mg daily today given stable BP -BP 142/90>144/78>123/72>140/72 -Plan OP ischemic evaluation with coronary CT to determine etiology of cardiomyopathy with close follow up  -No anginal symptoms  2.  HLD: -Last LDL 04/28/2019, 90 with a goal of <70 -Start atorvastatin 80 mg -Obtain lipid panel, LFTs in 6 to 8 weeks after discharge  3. HTN: -Stable, 142/90>144/78>123/72 -Not on home antihypertensives -Regimen as described above   4. Breast CA s/p mastectomy and radiation: -Pt  reports hx of breast cancer with intense radiation to her chest in 2016   Signed, Kathyrn Drown NP-C [place] Pager: [phone] 02/20/2019, 7:48 AM     For questions or updates, please contact   Please consult [email or web address] for contact info under Cardiology/STEMI.

## 2019-02-20 NOTE — Plan of Care (Signed)
  Problem: Education: ?Goal: Knowledge of General Education information will improve ?Description: Including pain rating scale, medication(s)/side effects and non-pharmacologic comfort measures ?Outcome: Progressing ?  ?Problem: Clinical Measurements: ?Goal: Cardiovascular complication will be avoided ?Outcome: Progressing ?  ?Problem: Activity: ?Goal: Risk for activity intolerance will decrease ?Outcome: Progressing ?  ?Problem: Elimination: ?Goal: Will not experience complications related to bowel motility ?Outcome: Progressing ?  ?

## 2019-02-20 NOTE — Progress Notes (Addendum)
[facility]  PROGRESS NOTE  Sherry Bender [id number] DOB: 06-10-1951 DOA: 02/18/2019 PCP: Sharion Balloon, FNP   Brief HPI:   68 year old female with a history of hypertension, hyperlipidemia, GERD, came to ED with shortness of breath for 2 weeks.  Reported shortness of breath at rest, worse with exertion, orthopnea.  Denies PND.  BNP was elevated 1157.    Subjective   Patient seen and examined, denies chest pain or shortness of breath.   Assessment/Plan:     1. Acute respiratory failure-secondary to acute combined systolic and diastolic heart failure.  Started on IV Lasix 40 mg every 12 hours.  Echocardiogram showed EF 25 to 30%, left ventricle diastolic Doppler parameters consistent with pseudonormalization.  Elevated mean left atrial pressure.  Cardiology consulted for new onset acute systolic heart failure.  Will follow cardiology recommendations.  Strict intake and output.  2. ? Community-acquired pneumonia-chest x-ray showed left pleural effusion with consolidation.  Started on Levaquin.    3. Hypertension-blood pressures controlled, lisinopril on hold for washout to start Entresto from tomorrow morning  4. Hypokalemia-replete  5. GERD-continue Pepcid  6. Left-sided breast cancer-status post mastectomy in 2016.    CBC: Recent Labs  Lab 02/18/19 0936  WBC 4.1  NEUTROABS 3.2  HGB 13.1  HCT 41.0  MCV 93.8  PLT 130*    Basic Metabolic Panel: Recent Labs  Lab 02/18/19 0936 02/19/19 0508 02/20/19 0519  NA 137 137 133*  K 3.4* 3.2* 3.6  CL 97* 99 97*  CO2 28 28 25   GLUCOSE 89 91 95  BUN 11 13 16   CREATININE 0.31* <0.30* 0.30*  CALCIUM 9.5 8.7* 8.8*     DVT prophylaxis: Lovenox  Code Status: Full code  Family Communication: No family at bedside  Disposition Plan: likely home when medically ready for discharge.  Likely discharge in a.m.   BMI  Estimated body mass index is 18.05 kg/m as calculated from the following:   Height as of this  encounter: 5\' 4"  (1.626 m).   Weight as of this encounter: 47.7 kg.      Scheduled medications:  . atorvastatin  80 mg Oral q1800  . carvedilol  3.125 mg Oral BID WC  . docusate sodium  100 mg Oral Daily  . enoxaparin (LOVENOX) injection  40 mg Subcutaneous Q24H  . fesoterodine  4 mg Oral Daily  . furosemide  40 mg Intravenous Q12H  . levofloxacin  500 mg Oral Daily  . sodium chloride flush  3 mL Intravenous Q12H  . spironolactone  12.5 mg Oral Daily    Consultants:  Cardiology  Procedures:     Antibiotics:   Anti-infectives (From admission, onward)   Start     Dose/Rate Route Frequency Ordered Stop   02/18/19 2200  amoxicillin-clavulanate (AUGMENTIN) 875-125 MG per tablet 1 tablet  Status:  Discontinued     1 tablet Oral Every 12 hours 02/18/19 1546 02/18/19 1548   02/18/19 1700  levofloxacin (LEVAQUIN) tablet 500 mg     500 mg Oral Daily 02/18/19 1548         Objective   Vitals:   02/20/19 0500 02/20/19 0512 02/20/19 0744 02/20/19 1316  BP:  (!) 144/78 (!) 142/90 130/82  Pulse:  73 77 88  Resp:  18  16  Temp:  97.8 F (36.6 C)  (!) 97.5 F (36.4 C)  TempSrc:  Oral  Oral  SpO2:  93%  96%  Weight: 47.7 kg     Height:  Intake/Output Summary (Last 24 hours) at 02/20/2019 1422 Last data filed at 02/20/2019 1317 Gross per 24 hour  Intake 977.56 ml  Output 2100 ml  Net -1122.44 ml   Filed Weights   02/18/19 1718 02/19/19 0448 02/20/19 0500  Weight: 47 kg 47.9 kg 47.7 kg     Physical Examination:   General-appears in no acute distress  Heart-S1-S2, regular, no murmur auscultated  Lungs-clear to auscultation bilaterally, no wheezing or crackles auscultated  Abdomen-soft, nontender, no organomegaly  Extremities-no edema in the lower extremities  Neuro-alert, oriented x3, no focal deficit noted     Data Reviewed: I have personally reviewed following labs and imaging studies   Recent Results (from the past 240 hour(s))  SARS  Coronavirus 2 [facility] order, Performed in [place] hospital lab) Nasopharyngeal Nasopharyngeal Swab     Status: None   Collection Time: 02/18/19  9:43 AM   Specimen: Nasopharyngeal Swab  Result Value Ref Range Status   SARS Coronavirus 2 NEGATIVE NEGATIVE Final    Comment: (NOTE) If result is NEGATIVE SARS-CoV-2 target nucleic acids are NOT DETECTED. The SARS-CoV-2 RNA is generally detectable in upper and lower  respiratory specimens during the acute phase of infection. The lowest  concentration of SARS-CoV-2 viral copies this assay can detect is 250  copies / mL. A negative result does not preclude SARS-CoV-2 infection  and should not be used as the sole basis for treatment or other  patient management decisions.  A negative result may occur with  improper specimen collection / handling, submission of specimen other  than nasopharyngeal swab, presence of viral mutation(s) within the  areas targeted by this assay, and inadequate number of viral copies  (<250 copies / mL). A negative result must be combined with clinical  observations, patient history, and epidemiological information. If result is POSITIVE SARS-CoV-2 target nucleic acids are DETECTED. The SARS-CoV-2 RNA is generally detectable in upper and lower  respiratory specimens dur ing the acute phase of infection.  Positive  results are indicative of active infection with SARS-CoV-2.  Clinical  correlation with patient history and other diagnostic information is  necessary to determine patient infection status.  Positive results do  not rule out bacterial infection or co-infection with other viruses. If result is PRESUMPTIVE POSTIVE SARS-CoV-2 nucleic acids MAY BE PRESENT.   A presumptive positive result was obtained on the submitted specimen  and confirmed on repeat testing.  While 2019 novel coronavirus  (SARS-CoV-2) nucleic acids may be present in the submitted sample  additional confirmatory testing may be necessary  for epidemiological  and / or clinical management purposes  to differentiate between  SARS-CoV-2 and other Sarbecovirus currently known to infect humans.  If clinically indicated additional testing with an alternate test  methodology [phone]) is advised. The SARS-CoV-2 RNA is generally  detectable in upper and lower respiratory sp ecimens during the acute  phase of infection. The expected result is Negative. Fact Sheet for Patients:  StrictlyIdeas.no Fact Sheet for Healthcare Providers: BankingDealers.co.za This test is not yet approved or cleared by the [place] FDA and has been authorized for detection and/or diagnosis of SARS-CoV-2 by FDA under an Emergency Use Authorization (EUA).  This EUA will remain in effect (meaning this test can be used) for the duration of the COVID-19 declaration under Section 564(b)(1) of the Act, 21 U.S.C. section 360bbb-3(b)(1), unless the authorization is terminated or revoked sooner. Performed at [facility], [place] [address], [place], [place] [address]  Liver Function Tests: Recent Labs  Lab 02/18/19 0936  AST 34  ALT 29  ALKPHOS 58  BILITOT 0.6  PROT 7.2  ALBUMIN 4.2   No results for input(s): LIPASE, AMYLASE in the last 168 hours. No results for input(s): AMMONIA in the last 168 hours.  Cardiac Enzymes: No results for input(s): CKTOTAL, CKMB, CKMBINDEX, TROPONINI in the last 168 hours. BNP (last 3 results) Recent Labs    02/18/19 0936  BNP 1,157.3*    ProBNP (last 3 results) No results for input(s): PROBNP in the last 8760 hours.    Studies: No results found.   Admission status: Inpatient: Based on patients clinical presentation and evaluation of above clinical data, I have made determination that patient meets Inpatient criteria at this time.  Time spent: [facility] Pager [phone]. If 7PM-7AM, please contact  night-coverage at [email or web address], Office  [phone]  password [email or web address]  02/20/2019, 2:22 PM  LOS: 2 days

## 2019-02-20 NOTE — Progress Notes (Signed)
Progress Note  Patient Name: Sherry Bender Date of Encounter: 02/20/2019  Primary Cardiologist: New to [place] well today. Eating breakfast. Denies chest pain or SOB. Has lots of questions about coronary CT>>answered   Inpatient Medications    Scheduled Meds: . atorvastatin  80 mg Oral q1800  . carvedilol  3.125 mg Oral BID WC  . docusate sodium  100 mg Oral Daily  . enoxaparin (LOVENOX) injection  40 mg Subcutaneous Q24H  . fesoterodine  4 mg Oral Daily  . furosemide  40 mg Intravenous Q12H  . levofloxacin  500 mg Oral Daily  . sodium chloride flush  3 mL Intravenous Q12H  . spironolactone  12.5 mg Oral Daily   Continuous Infusions: . sodium chloride     PRN Meds: sodium chloride, acetaminophen, diphenhydrAMINE, famotidine, Melatonin, ondansetron (ZOFRAN) IV, polyethylene glycol, sodium chloride flush   Vital Signs    Vitals:   02/19/19 2113 02/20/19 0500 02/20/19 0512 02/20/19 0744  BP: 123/72  (!) 144/78 (!) 142/90  Pulse: 79  73 77  Resp: 18  18   Temp: 97.9 F (36.6 C)  97.8 F (36.6 C)   TempSrc: Oral  Oral   SpO2: 95%  93%   Weight:  47.7 kg    Height:        Intake/Output Summary (Last 24 hours) at 02/20/2019 1206 Last data filed at 02/20/2019 [id number] Gross per 24 hour  Intake 1380.06 ml  Output 1000 ml  Net 380.06 ml   Filed Weights   02/18/19 1718 02/19/19 0448 02/20/19 0500  Weight: 47 kg 47.9 kg 47.7 kg    Physical Exam   General: Frail, NAD Lungs:Clear to ausculation bilaterally. No wheezes, rales, or rhonchi. Breathing is unlabored. Cardiovascular: RRR with S1 S2. No murmur Abdomen: Soft, non-tender, non-distended. No obvious abdominal masses. Extremities: No edema. No clubbing or cyanosis. DP pulses 2+ bilaterally Neuro: Alert and oriented. No focal deficits. No facial asymmetry. MAE spontaneously. Psych: Responds to questions appropriately with normal affect.    Labs    Chemistry Recent Labs  Lab 02/18/19 0936  02/19/19 0508 02/20/19 0519  NA 137 137 133*  K 3.4* 3.2* 3.6  CL 97* 99 97*  CO2 28 28 25   GLUCOSE 89 91 95  BUN 11 13 16   CREATININE 0.31* <0.30* 0.30*  CALCIUM 9.5 8.7* 8.8*  PROT 7.2  --   --   ALBUMIN 4.2  --   --   AST 34  --   --   ALT 29  --   --   ALKPHOS 58  --   --   BILITOT 0.6  --   --   GFRNONAA >60 NOT CALCULATED >60  GFRAA >60 NOT CALCULATED >60  ANIONGAP 12 10 11      Hematology Recent Labs  Lab 02/18/19 0936  WBC 4.1  RBC 4.37  HGB 13.1  HCT 41.0  MCV 93.8  MCH 30.0  MCHC 32.0  RDW 13.4  PLT 130*    Cardiac EnzymesNo results for input(s): TROPONINI in the last 168 hours. No results for input(s): TROPIPOC in the last 168 hours.   BNP Recent Labs  Lab 02/18/19 0936  BNP 1,157.3*     DDimer No results for input(s): DDIMER in the last 168 hours.   Radiology    No results found. Telemetry    02/20/2019 NSR - Personally Reviewed  ECG    No new tracing as of 02/20/2019- Personally  Reviewed  Cardiac Studies   Echocardiogram 02/18/2019:  1. The left ventricle has severely reduced systolic function, with an ejection fraction of 25-30%. The cavity size was severely dilated. Left ventricular diastolic Doppler parameters are consistent with pseudonormalization. Elevated mean left atrial  pressure Left ventrical global hypokinesis without regional wall motion abnormalities. 2. The right ventricle has normal systolic function. The cavity was normal. There is no increase in right ventricular wall thickness. Right ventricular systolic pressure could not be assessed. 3. Left atrial size was mildly dilated. 4. Trivial pericardial effusion is present. 5. The aorta is normal unless otherwise noted. 6. The inferior vena cava was dilated in size with >50% respiratory variability. 7. No intracardiac thrombi or masses were visualized. 8. When compared to the prior study: there is marked left ventricular dilation and deterioration in left  ventricular systolic function since Q000111Q.  Echocardiogram 11/25/2014:  - Left ventricle: The cavity size was normal. Systolic function was normal. The estimated ejection fraction was in the range of 55% to 60%. Wall motion was normal; there were no regional wall motion abnormalities. Doppler parameters are consistent with abnormal left ventricular relaxation (grade 1 diastolic dysfunction). There was no evidence of elevated ventricular filling pressure by Doppler parameters. - Aortic valve: Trileaflet; normal thickness leaflets. There was no regurgitation. - Aortic root: The aortic root was normal in size. - Left atrium: The atrium was normal in size. - Right ventricle: Systolic function was normal. - Right atrium: The atrium was normal in size. - Tricuspid valve: There was no regurgitation. - Pulmonic valve: Structurally normal valve. - Pulmonary arteries: Systolic pressure was within the normal range. - Inferior vena cava: The vessel was normal in size. - Pericardium, extracardiac: There was no pericardial effusion.  Impressions:  - Normal strain parameters: Global longitudinal strain: -18.2%, lateral S prime 8.4 cm/sec.  Patient Profile     68 y.o. female with a hx of hyperlipidemia, breast cancer s/p mastectomy and radiaiton 2016 and GERD who is being seen today for the evaluation of new acute systolic and diastolic CHF/cardiomyopathy at the request of Dr. Karleen Hampshire.  Assessment & Plan    1.  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 -Weight, 105.6lb  -I&O, net negative 1.5L since admission>>reports breathing has improved  -Patient placed on low-dose lisinopril 2.5 mg>>>discontinued>>needs 36H washout in anticipation of Entresto initiation 09/04 -Creatinine stable at  0.30 -Start low-dose carvedilol 3.125 mg and ASA 81 -Will add spironolactone 12.5 mg daily today given stable BP -BP 142/90>144/78>123/72>140/72 -Plan OP ischemic evaluation with coronary CT to determine etiology of cardiomyopathy with close follow up  -No anginal symptoms  2.  HLD: -Last LDL 04/28/2019, 90 with a goal of <70 -Start atorvastatin 80 mg -Obtain lipid panel, LFTs in 6 to 8 weeks after discharge  3. HTN: -Stable, 142/90>144/78>123/72 -Not on home antihypertensives -Regimen as described above   4. Breast CA s/p mastectomy and radiation: -Pt reports hx of breast cancer with intense radiation to her chest in 2016   Signed, Kathyrn Drown NP-C [place] Pager: [phone] 02/20/2019, 12:06 PM     For questions or updates, please contact   Please consult [email or web address] for contact info under Cardiology/STEMI.  Patient seen and examined.  Agree with above documentation.  She reports shortness of breath is significantly improved.  Denies any chest pain.  On exam, she is alert and  oriented, lungs are clear to auscultation bilaterally, regular rate and rhythm no murmurs, no lower extremity edema.  She is appearing more euvolemic, we will plan to transition to p.o. Lasix tomorrow.  Continue carvedilol 3.125 mg twice daily.  Add spironolactone 12.5 mg daily today.  Plan to add [facility] tomorrow after 36-hour washout from lisinopril.

## 2019-02-21 LAB — BASIC METABOLIC PANEL
Anion gap: 8 (ref 5–15)
BUN: 21 mg/dL (ref 8–23)
CO2: 28 mmol/L (ref 22–32)
Calcium: 8.9 mg/dL (ref 8.9–10.3)
Chloride: 95 mmol/L — ABNORMAL LOW (ref 98–111)
Creatinine, Ser: <0.3 mg/dL — ABNORMAL LOW (ref 0.44–1.00)
Glucose, Bld: 95 mg/dL (ref 70–99)
Potassium: 3.7 mmol/L (ref 3.5–5.1)
Sodium: 131 mmol/L — ABNORMAL LOW (ref 135–145)

## 2019-02-21 MED ORDER — ATORVASTATIN CALCIUM 80 MG PO TABS: 80.0000 mg | ORAL_TABLET | Freq: Every day | ORAL | 2 refills | Status: DC

## 2019-02-21 MED ORDER — CARVEDILOL 3.125 MG PO TABS: 3.1250 mg | ORAL_TABLET | Freq: Two times a day (BID) | ORAL | 2 refills | Status: DC

## 2019-02-21 MED ORDER — SPIRONOLACTONE 25 MG PO TABS: 12.5000 mg | ORAL_TABLET | Freq: Every day | ORAL | 2 refills | Status: DC

## 2019-02-21 MED ORDER — FUROSEMIDE 40 MG PO TABS: 40.0000 mg | ORAL_TABLET | Freq: Every day | ORAL | 3 refills | Status: DC

## 2019-02-21 MED ORDER — SACUBITRIL-VALSARTAN 24-26 MG PO TABS: 1.0000 | ORAL_TABLET | Freq: Two times a day (BID) | ORAL | 2 refills | Status: DC

## 2019-02-21 MED ORDER — SACUBITRIL-VALSARTAN 24-26 MG PO TABS
1.0000 | ORAL_TABLET | Freq: Two times a day (BID) | ORAL | Status: DC
  Administered 2019-02-21: 1 via ORAL
  Filled 2019-02-21 (×2): qty 1

## 2019-02-21 MED ORDER — LEVOFLOXACIN 500 MG PO TABS: 500.0000 mg | ORAL_TABLET | Freq: Every day | ORAL | 0 refills | Status: DC

## 2019-02-21 MED FILL — CARVEDILOL 3.125 MG TABLET: 3.125 | 30 days supply | Qty: 60 | Fill #0

## 2019-02-21 MED FILL — ATORVASTATIN 80 MG TABLET: 80 | 30 days supply | Qty: 30 | Fill #0

## 2019-02-21 MED FILL — SPIRONOLACTONE 25 MG TABS: 25 | 30 days supply | Qty: 15 | Fill #0

## 2019-02-21 MED FILL — FUROSEMIDE 40 MG TAB: 40 | 30 days supply | Qty: 30 | Fill #0

## 2019-02-21 MED FILL — ENTRESTO 24 MG-26 MG TABLET: 24-26 | 30 days supply | Qty: 60 | Fill #0

## 2019-02-21 MED FILL — levoFLOXacin 500 MG TABS: 500 | 2 days supply | Qty: 2 | Fill #0

## 2019-02-21 NOTE — Discharge Summary (Signed)
Physician Discharge Summary  Sherry Bender [id number] DOB: 1950/12/13 DOA: 02/18/2019  PCP: Sharion Balloon, FNP  Admit date: 02/18/2019 Discharge date: 02/21/2019  Time spent: 40* minutes  Recommendations for Outpatient Follow-up:  1. Follow PCP in 1 week 2. Follow-up cardiology on 03/03/2019 3. May need a BMP in 1 week.  Discharge Diagnoses:  Active Problems:   Hyperlipidemia   HTN (hypertension)   Acute respiratory failure (HCC)   Acute systolic heart failure [facility])   Discharge Condition: Stable  Diet recommendation: Heart healthy diet  Filed Weights   02/19/19 0448 02/20/19 0500 02/21/19 0555  Weight: 47.9 kg 47.7 kg 45.5 kg    History of present illness:  68 year old female with a history of hypertension, hyperlipidemia, GERD, came to ED with shortness of breath for 2 weeks.  Reported shortness of breath at rest, worse with exertion, orthopnea.  Denies PND.  BNP was elevated 1157.   Hospital Course:   1. Acute respiratory failure-secondary to acute combined systolic and diastolic heart failure.  Started on IV Lasix 40 mg every 12 hours.  Echocardiogram showed EF 25 to 30%, left ventricle diastolic Doppler parameters consistent with pseudonormalization.  Elevated mean left atrial pressure.  Cardiology consulted for new onset acute systolic heart failure.  Patient has been started on Lasix 40 mg daily, Aldactone 12.5 mg daily, Coreg 3.125 twice daily, Entresto.  Patient will follow up with cardiology in 10 days.  He will need a BMP as outpatient in 1 week.  2. ? Community-acquired pneumonia-chest x-ray showed left pleural effusion with consolidation.  Started on Levaquin.  Will discharge on Levaquin for 2 more days.  Patient has been afebrile.  Clinically  improved.   3. Hypertension-blood pressures controlled, lisinopril was on hold for washout.  Started on Entresto yesterday  4. Hypokalemia-replete  5. GERD-continue Pepcid  6. Left-sided breast cancer-status post  mastectomy in 2016.   Procedures: Echocardiogram  Consultations:  Cardiology  Discharge Exam: Vitals:   02/20/19 2029 02/21/19 0557  BP: 124/71 136/79  Pulse: 77 64  Resp: 16 16  Temp: 97.6 F (36.4 C) 97.6 F (36.4 C)  SpO2: 96% 92%    General: Appears in no acute distress Cardiovascular: S1-S2, regular Respiratory: Clear to auscultation bilaterally  Discharge Instructions   Discharge Instructions    Diet - low sodium heart healthy   Complete by: As directed    Increase activity slowly   Complete by: As directed      Allergies as of 02/21/2019      Reactions   Codeine Nausea And Vomiting      Medication List    STOP taking these medications   amoxicillin-clavulanate 875-125 MG tablet Commonly known as: AUGMENTIN     TAKE these medications   atorvastatin 80 MG tablet Commonly known as: LIPITOR Take 1 tablet (80 mg total) by mouth daily at 6 PM.   carvedilol 3.125 MG tablet Commonly known as: COREG Take 1 tablet (3.125 mg total) by mouth 2 (two) times daily with a meal.   Dexilant 60 MG capsule Generic drug: dexlansoprazole Take 1 capsule (60 mg total) by mouth daily.   docusate sodium 250 MG capsule Commonly known as: COLACE Take 1 capsule (250 mg total) by mouth daily.   estradiol 1 MG tablet Commonly known as: ESTRACE TAKE 1 TABLET BY MOUTH EVERY DAY   famotidine 20 MG tablet Commonly known as: PEPCID Take 20 mg by mouth 2 (two) times daily as needed for heartburn or indigestion.   furosemide  40 MG tablet Commonly known as: LASIX Take 1 tablet (40 mg total) by mouth daily. Start taking on: February 22, 2019   levofloxacin 500 MG tablet Commonly known as: LEVAQUIN Take 1 tablet (500 mg total) by mouth daily. Start taking on: February 22, 2019   lidocaine 5 % Commonly known as: LIDODERM Place 1 patch onto the skin daily.   polyvinyl alcohol 1.4 % ophthalmic solution Commonly known as: LIQUIFILM TEARS Place 1 drop into both eyes  as needed for dry eyes.   sacubitril-valsartan 24-26 MG Commonly known as: ENTRESTO Take 1 tablet by mouth 2 (two) times daily.   spironolactone 25 MG tablet Commonly known as: ALDACTONE Take 0.5 tablets (12.5 mg total) by mouth daily. Start taking on: February 22, 2019   tolterodine 2 MG 24 hr capsule Commonly known as: DETROL LA Take 1 capsule (2 mg total) by mouth daily.      Allergies  Allergen Reactions  . Codeine Nausea And Vomiting   Follow-up Information    Lendon Colonel, NP Follow up on 03/03/2019.   Specialties: Nurse Practitioner, Radiology, Cardiology Why: Your follwo up appointment will be on 03/03/2019 at [place] information: [address] [place] [address] [phone]        Evelina Dun A, FNP Follow up in 1 week(s).   Specialty: Family Medicine Why: Check BMP in one week  Contact information: [place] [address] [phone]            The results of significant diagnostics from this hospitalization (including imaging, microbiology, ancillary and laboratory) are listed below for reference.    Significant Diagnostic Studies: Dg Chest Port 1 View  Result Date: 02/18/2019 CLINICAL DATA:  Dyspnea, lower extremity swelling, productive EXAM: PORTABLE CHEST 1 VIEW COMPARISON:  06/24/2014 FINDINGS: Cardiomegaly. There is a small to moderate left pleural effusion with associated atelectasis or consolidation. The visualized skeletal structures are unremarkable. IMPRESSION: 1. There is a small to moderate left pleural effusion with associated atelectasis or consolidation, concerning for infection or aspiration. 2.  Cardiomegaly. Electronically Signed   By: Eddie Candle M.D.   On: 02/18/2019 10:02    Microbiology: Recent Results (from the past 240 hour(s))  SARS Coronavirus 2 [facility] order, Performed in [facility] hospital lab) Nasopharyngeal Nasopharyngeal Swab     Status: None   Collection Time:  02/18/19  9:43 AM   Specimen: Nasopharyngeal Swab  Result Value Ref Range Status   SARS Coronavirus 2 NEGATIVE NEGATIVE Final    Comment: (NOTE) If result is NEGATIVE SARS-CoV-2 target nucleic acids are NOT DETECTED. The SARS-CoV-2 RNA is generally detectable in upper and lower  respiratory specimens during the acute phase of infection. The lowest  concentration of SARS-CoV-2 viral copies this assay can detect is 250  copies / mL. A negative result does not preclude SARS-CoV-2 infection  and should not be used as the sole basis for treatment or other  patient management decisions.  A negative result may occur with  improper specimen collection / handling, submission of specimen other  than nasopharyngeal swab, presence of viral mutation(s) within the  areas targeted by this assay, and inadequate number of viral copies  (<250 copies / mL). A negative result must be combined with clinical  observations, patient history, and epidemiological information. If result is POSITIVE SARS-CoV-2 target nucleic acids are DETECTED. The SARS-CoV-2 RNA is generally detectable in upper and lower  respiratory specimens dur ing the acute phase of infection.  Positive  results are indicative of active infection with SARS-CoV-2.  Clinical  correlation with patient history and other diagnostic information is  necessary to determine patient infection status.  Positive results do  not rule out bacterial infection or co-infection with other viruses. If result is PRESUMPTIVE POSTIVE SARS-CoV-2 nucleic acids MAY BE PRESENT.   A presumptive positive result was obtained on the submitted specimen  and confirmed on repeat testing.  While 2019 novel coronavirus  (SARS-CoV-2) nucleic acids may be present in the submitted sample  additional confirmatory testing may be necessary for epidemiological  and / or clinical management purposes  to differentiate between  SARS-CoV-2 and other Sarbecovirus currently known to  infect humans.  If clinically indicated additional testing with an alternate test  methodology [phone]) is advised. The SARS-CoV-2 RNA is generally  detectable in upper and lower respiratory sp ecimens during the acute  phase of infection. The expected result is Negative. Fact Sheet for Patients:  StrictlyIdeas.no Fact Sheet for Healthcare Providers: BankingDealers.co.za This test is not yet approved or cleared by the [place] FDA and has been authorized for detection and/or diagnosis of SARS-CoV-2 by FDA under an Emergency Use Authorization (EUA).  This EUA will remain in effect (meaning this test can be used) for the duration of the COVID-19 declaration under Section 564(b)(1) of the Act, 21 U.S.C. section 360bbb-3(b)(1), unless the authorization is terminated or revoked sooner. Performed at [facility], [place] [address] [place], [place] [address]      Labs: Basic Metabolic Panel: Recent Labs  Lab 02/18/19 0936 02/19/19 0508 02/20/19 0519 02/21/19 0520  NA 137 137 133* 131*  K 3.4* 3.2* 3.6 3.7  CL 97* 99 97* 95*  CO2 28 28 25 28   GLUCOSE 89 91 95 95  BUN 11 13 16 21   CREATININE 0.31* <0.30* 0.30* <0.30*  CALCIUM 9.5 8.7* 8.8* 8.9   Liver Function Tests: Recent Labs  Lab 02/18/19 0936  AST 34  ALT 29  ALKPHOS 58  BILITOT 0.6  PROT 7.2  ALBUMIN 4.2   No results for input(s): LIPASE, AMYLASE in the last 168 hours. No results for input(s): AMMONIA in the last 168 hours. CBC: Recent Labs  Lab 02/18/19 0936  WBC 4.1  NEUTROABS 3.2  HGB 13.1  HCT 41.0  MCV 93.8  PLT 130*    BNP (last 3 results) Recent Labs    02/18/19 0936  BNP 1,157.3*     Signed:  Oswald Hillock MD.  [facility] 02/21/2019, 1:20 PM

## 2019-02-21 NOTE — Progress Notes (Addendum)
Progress Note  Patient Name: Sherry Bender Date of Encounter: 02/21/2019  Primary Cardiologist: New to [facility]   Subjective   Doing well this AM. Ready to go home. Denies SOB, chest pain.   Inpatient Medications    Scheduled Meds: . atorvastatin  80 mg Oral q1800  . carvedilol  3.125 mg Oral BID WC  . docusate sodium  100 mg Oral Daily  . enoxaparin (LOVENOX) injection  40 mg Subcutaneous Q24H  . fesoterodine  4 mg Oral Daily  . furosemide  40 mg Oral Daily  . levofloxacin  500 mg Oral Daily  . sodium chloride flush  3 mL Intravenous Q12H  . spironolactone  12.5 mg Oral Daily   Continuous Infusions: . sodium chloride     PRN Meds: sodium chloride, acetaminophen, diphenhydrAMINE, famotidine, Melatonin, ondansetron (ZOFRAN) IV, polyethylene glycol, sodium chloride flush   Vital Signs    Vitals:   02/20/19 1316 02/20/19 2029 02/21/19 0555 02/21/19 0557  BP: 130/82 124/71  136/79  Pulse: 88 77  64  Resp: 16 16  16   Temp: (!) 97.5 F (36.4 C) 97.6 F (36.4 C)  97.6 F (36.4 C)  TempSrc: Oral Oral  Oral  SpO2: 96% 96%  92%  Weight:   45.5 kg   Height:        Intake/Output Summary (Last 24 hours) at 02/21/2019 0825 Last data filed at 02/21/2019 0600 Gross per 24 hour  Intake 1793 ml  Output 1300 ml  Net 493 ml   Filed Weights   02/19/19 0448 02/20/19 0500 02/21/19 0555  Weight: 47.9 kg 47.7 kg 45.5 kg    Physical Exam   General: Frail, NAD Neck: Negative for carotid bruits. No JVD Lungs:Clear to ausculation bilaterally. No wheezes, rales, or rhonchi. Breathing is unlabored. Cardiovascular: RRR with S1 S2. No murmur Abdomen: Soft, non-tender, non-distended. No obvious abdominal masses. Extremities: No edema. No clubbing or cyanosis. DP pulses 2+ bilaterally Neuro: Alert and oriented. No focal deficits. No facial asymmetry. MAE spontaneously. Psych: Responds to questions appropriately with normal affect.    Labs    Chemistry Recent Labs  Lab 02/18/19  0936 02/19/19 0508 02/20/19 0519 02/21/19 0520  NA 137 137 133* 131*  K 3.4* 3.2* 3.6 3.7  CL 97* 99 97* 95*  CO2 28 28 25 28   GLUCOSE 89 91 95 95  BUN 11 13 16 21   CREATININE 0.31* <0.30* 0.30* <0.30*  CALCIUM 9.5 8.7* 8.8* 8.9  PROT 7.2  --   --   --   ALBUMIN 4.2  --   --   --   AST 34  --   --   --   ALT 29  --   --   --   ALKPHOS 58  --   --   --   BILITOT 0.6  --   --   --   GFRNONAA >60 NOT CALCULATED >60 NOT CALCULATED  GFRAA >60 NOT CALCULATED >60 NOT CALCULATED  ANIONGAP 12 10 11 8      Hematology Recent Labs  Lab 02/18/19 0936  WBC 4.1  RBC 4.37  HGB 13.1  HCT 41.0  MCV 93.8  MCH 30.0  MCHC 32.0  RDW 13.4  PLT 130*    Cardiac EnzymesNo results for input(s): TROPONINI in the last 168 hours. No results for input(s): TROPIPOC in the last 168 hours.   BNP Recent Labs  Lab 02/18/19 0936  BNP 1,157.3*     DDimer No results for  input(s): DDIMER in the last 168 hours.   Radiology    No results found.  Telemetry    02/21/2019 NSR HR 80's - Personally Reviewed  ECG    No new tracing as of 02/21/2019- Personally Reviewed  Cardiac Studies   Echocardiogram 02/18/2019:  1. The left ventricle has severely reduced systolic function, with an ejection fraction of 25-30%. The cavity size was severely dilated. Left ventricular diastolic Doppler parameters are consistent with pseudonormalization. Elevated mean left atrial  pressure Left ventrical global hypokinesis without regional wall motion abnormalities. 2. The right ventricle has normal systolic function. The cavity was normal. There is no increase in right ventricular wall thickness. Right ventricular systolic pressure could not be assessed. 3. Left atrial size was mildly dilated. 4. Trivial pericardial effusion is present. 5. The aorta is normal unless otherwise noted. 6. The inferior vena cava was dilated in size with >50% respiratory variability. 7. No intracardiac thrombi or masses were  visualized. 8. When compared to the prior study: there is marked left ventricular dilation and deterioration in left ventricular systolic function since [id number].  Echocardiogram 11/25/2014:  - Left ventricle: The cavity size was normal. Systolic function was normal. The estimated ejection fraction was in the range of 55% to 60%. Wall motion was normal; there were no regional wall motion abnormalities. Doppler parameters are consistent with abnormal left ventricular relaxation (grade 1 diastolic dysfunction). There was no evidence of elevated ventricular filling pressure by Doppler parameters. - Aortic valve: Trileaflet; normal thickness leaflets. There was no regurgitation. - Aortic root: The aortic root was normal in size. - Left atrium: The atrium was normal in size. - Right ventricle: Systolic function was normal. - Right atrium: The atrium was normal in size. - Tricuspid valve: There was no regurgitation. - Pulmonic valve: Structurally normal valve. - Pulmonary arteries: Systolic pressure was within the normal range. - Inferior vena cava: The vessel was normal in size. - Pericardium, extracardiac: There was no pericardial effusion.  Impressions:  - Normal strain parameters: Global longitudinal strain: -18.2%, lateral S prime 8.4 cm/sec.  Patient Profile     68 y.o. female hx ofhyperlipidemia,breast cancers/pmastectomyand radiaiton2016 and GERDwho is being seen today for the evaluation of new acute systolic and diastolic CHF/cardiomyopathyat the request of Dr.Akula.  Assessment & Plan    1.Severe acute systolic and diastolic CHF exacerbation: -Echocardiogram performed showed severely reduced systolic function with an LVEF of 25 to 30% with left ventricular global hypokinesis without regional wall motion abnormalities. -Weight,105.6lb -I&O,net negative 1.0L since admission>>reports breathing has improved -Will start low dose Entresto today  given adequate ACE washout period>>plan to up-titrate at follow up if BP and renal function stable  -Continue low-dose carvedilol 3.125 mg and ASA 81 -Spironolactone 12.5 mg daily added 02/20/2019 -BP 136/79>124/71>130/82 -Plan OP ischemic evaluation with coronary CT to determine etiology of cardiomyopathy with close follow up  -No anginal symptoms  2. HLD: -Last LDL 04/28/2019, 90 with a goal of<70 -Start atorvastatin 80 mg -Obtain lipid panel, LFTs in 6 to 8 weeks after discharge  3. HTN: -Stable, 136/79>124/71>130/82 -Not on home antihypertensives -Regimen as described above   4. Breast CA s/p mastectomy and radiation: -Pt reports hx of breast cancer with intense radiation to her chest in 2016   Signed, Kathyrn Drown NP-C [facility] Pager: [phone] 02/21/2019, 8:25 AM    [facility] will sign off.   Medication Recommendations:  Lasix 40 mg daily, carvedilol 3.125 mg twice daily, spironolactone 12.5 mg daily, and Entresto 24-26 mg  BID Other recommendations (labs, testing, etc):  BMET in 1 week Follow up as an outpatient:  Scheduled for 03/03/19 with Jory Sims, NP For questions or updates, please contact   Please consult [email or web address] for contact info under Cardiology/STEMI.  Patient seen and examined.  Agree with above documentation.  Denies any chest pain or shortness of breath.  On exam, patient is alert and oriented, lungs are clear to auscultation bilaterally, regular rate and rhythm, no murmurs, no lower extremity edema or JVD.  Telemetry personally reviewed, which shows sinus rhythm with rate in 70s, no events.  Patient appears ready for discharge.  Will discharge on Lasix 40 mg daily, carvedilol 3.125 mg twice daily spironolactone 12.5 mg daily, and Entresto 24-26 mg BID.  Follow-up scheduled for 03/03/2019.

## 2019-02-22 ENCOUNTER — Telehealth: Payer: Self-pay | Admitting: Cardiology

## 2019-02-22 NOTE — Telephone Encounter (Signed)
Pt called c/o leg cramps and blurred vision.  She was just discharged on multiple medications for CHF including entresto and Aldactone.  I suggested she hold her Lasix till office visit 9/14.  Kerin Ransom PA-C 02/22/2019 4:45 PM

## 2019-02-26 ENCOUNTER — Encounter: Payer: Self-pay | Admitting: *Deleted

## 2019-02-26 ENCOUNTER — Telehealth: Payer: Self-pay | Admitting: Adult Health

## 2019-02-26 NOTE — Telephone Encounter (Signed)
Patient spoke with Kerin Ransom PA-C,  Saturday 02/22/2019 and was told to hold Lasix until 9/14 office visit. Patient states the cramps in her calves are no better.

## 2019-02-26 NOTE — Telephone Encounter (Signed)
Spoke to patient about concerns with her muscle cramps in her legs. Offered a virtual visit with Curt Bears, NP tomorrow versus the in-person visit on 03/03/19. Patient declined and was advised to follow the plan with holding her lasix until her visit on 03/03/19.

## 2019-02-26 NOTE — Telephone Encounter (Signed)
Patient called stating she is still waiting on call back.

## 2019-02-26 NOTE — Telephone Encounter (Signed)
LMTCB

## 2019-02-26 NOTE — Telephone Encounter (Signed)
New Message   Patient is calling because she is having heavy cramps in her legs and its affecting her ability to walk. She just discharged from the hospital on 09/04 and she is not sure as to which med is causing the issue. She thinks it either the [place] or Coreg though. Please call to discuss.

## 2019-02-27 ENCOUNTER — Ambulatory Visit: Payer: 59 | Admitting: Gastroenterology

## 2019-03-02 NOTE — Progress Notes (Signed)
Cardiology Office Note   Date:  03/03/2019   ID:  Vannessa Neeser, DOB 12-Jan-1951, MRN [id number]  PCP:  Sharion Balloon, FNP  Cardiologist: [facility]   History of Present Illness: Alese Oats is a 68 y.o. female who presents for posthospitalization follow-up after admission for acute systolic diastolic CHF, with cardiomyopathy, echocardiogram performed during hospitalization revealing ejection fraction 25% to 30% with left global hypokinesis without regional wall motion abnormalities.  She has a prior history of breast cancer status post mastectomy and radiation through 2016, GERD, and hyperlipidemia.  The patient was started on low-dose Entresto 24 mg / 26 mg twice daily, carvedilol 3.125 mg twice daily, Lasix 40 mg daily and aspirin 81 mg, spironolactone was also instituted on 02/20/2019 at 12.5 mg daily.  Due to hyperlipidemia, with a goal of LDL less than 70 atorvastatin 80 mg was ordered.  She was to have follow-up lipids and LFTs in 6 to 8 weeks post discharge patient weight was 105.6 pounds.  She was planned for an outpatient coronary CTA to determine etiology of cardiomyopathy with close follow-up.  BMET is to be drawn on this office visit.  She was also treated for community-acquired pneumonia after chest x-ray revealed left pleural effusion with consolidation.  She was started on Levaquin and was to have 2 more doses after discharge.  She was also hypokalemic during admission, and was given potassium repletion.  As stated above she was discharged on Spironolactone.  The patient called our office on 02/22/2019 stating that she was having significant muscle cramps and blurred vision.  She was advised to hold Lasix until being seen on 03/03/2019.  I reviewed her BMET completed on discharge sodium was 131, potassium 3.7, creatinine < 0.30.  She comes today without any new complaints. She was taken off of lasix due to muscle cramps by her PCP.She feels much better.  She has no  evidence of fluid overload. She has multiple questions and appears quite anxious.   Past Medical History:  Diagnosis Date  . Anxiety   . Arthritis    IN BACK....  . Breast cancer ([place]) 2016  . 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 Complication of anesthesia    "HARD TO WAKE UP".......Marland KitchenN/V  . Cricopharyngeus muscle dysfunction   . Gastric ulcer   . GERD (gastroesophageal reflux disease)   . Hiatal hernia   . Hyperlipidemia   . Hypertension   . PONV (postoperative nausea and vomiting)   . Scoliosis   . Urinary frequency     Past Surgical History:  Procedure Laterality Date  . ABDOMINAL HYSTERECTOMY  2000   TAH/BSO  . ANTERIOR CERVICAL DECOMP/DISCECTOMY FUSION N/A 06/18/2014   Procedure: C4-5 C5-6 C6-7 ANTERIOR CERVICAL DECOMPRESSION/DISKECTOMY/FUSION;  Surgeon: Eustace Moore, MD;  Location: [place] [facility];  Service: Neurosurgery;  Laterality: N/A;  C4-5 C5-6 C6-7 ANTERIOR CERVICAL DECOMPRESSION/DISKECTOMY/FUSION  . ANTERIOR CERVICAL DECOMP/DISCECTOMY FUSION N/A 06/25/2014   Procedure: EXPLORATION OF CERVICAL FUSION WITH REVIOSN OF HARDWARE;  Surgeon: Eustace Moore, MD;  Location: [place] [facility];  Service: Neurosurgery;  Laterality: N/A;  . CERVICAL FUSION    . CHOLECYSTECTOMY  1994  . COMPLETE MASTECTOMY W/ SENTINEL NODE BIOPSY Left 04/30/2015  . FOOT NEUROMA SURGERY  01/2010   also had metatarasl shortened  . KNEE ARTHROSCOPY  2005   right knee  . MASTECTOMY Left 2016  . PORT-A-CATH REMOVAL Right 04/30/2015   Procedure: REMOVAL PORT-A-CATH;  Surgeon: Fanny Skates, MD;  Location: [facility];  Service: General;  Laterality: Right;  . PORTACATH PLACEMENT N/A 11/23/2014   Procedure: INSERTION PORT-A-CATH WITH ULTRASOUND;  Surgeon: Fanny Skates, MD;  Location: [place];  Service: General;  Laterality: N/A;  . RHINOPLASTY    . SIMPLE MASTECTOMY WITH AXILLARY SENTINEL NODE BIOPSY Left 04/30/2015   Procedure: LEFT TOTAL  MASTECTOMY WITH LEFT AXILLARY SENTINEL NODE BIOPSY;   Surgeon: Fanny Skates, MD;  Location: [place];  Service: General;  Laterality: Left;     Current Outpatient Medications  Medication Sig Dispense Refill  . atorvastatin (LIPITOR) 80 MG tablet Take 1 tablet (80 mg total) by mouth daily at 6 PM. 30 tablet 2  . carvedilol (COREG) 3.125 MG tablet Take 1 tablet (3.125 mg total) by mouth 2 (two) times daily with a meal. 180 tablet 3  . DEXILANT 60 MG capsule Take 1 capsule (60 mg total) by mouth daily. 30 capsule 11  . estradiol (ESTRACE) 1 MG tablet TAKE 1 TABLET BY MOUTH EVERY DAY (Patient taking differently: Take 1 mg by mouth daily. ) 90 tablet 3  . famotidine (PEPCID) 20 MG tablet Take 20 mg by mouth 2 (two) times daily as needed for heartburn or indigestion.    Marland Kitchen levofloxacin (LEVAQUIN) 500 MG tablet Take 1 tablet (500 mg total) by mouth daily. 2 tablet 0  . lidocaine (LIDODERM) 5 % Place 1 patch onto the skin daily. 30 patch 2  . polyvinyl alcohol (LIQUIFILM TEARS) 1.4 % ophthalmic solution Place 1 drop into both eyes as needed for dry eyes.    . sacubitril-valsartan (ENTRESTO) 24-26 MG Take 1 tablet by mouth 2 (two) times daily. 180 tablet 3  . spironolactone (ALDACTONE) 25 MG tablet Take 0.5 tablets (12.5 mg total) by mouth daily. 90 tablet 3  . tolterodine (DETROL LA) 2 MG 24 hr capsule Take 1 capsule (2 mg total) by mouth daily. 90 capsule 1   No current facility-administered medications for this visit.     Allergies:   Codeine    Social History:  The patient  reports that she quit smoking about 2 years ago. Her smoking use included cigarettes. She has a 10.50 pack-year smoking history. She has never used smokeless tobacco. She reports that she does not drink alcohol or use drugs.   Family History:  The patient's family history includes Cancer in her maternal grandmother and paternal aunt; Diabetes in her mother; Heart disease in her father and mother; Hypertension in her father and mother.    ROS: All other systems are reviewed and  negative. Unless otherwise mentioned in H&P    PHYSICAL EXAM: VS:  BP 136/81   Pulse 82   Ht 5\' 4"  (1.626 m)   Wt 95 lb 9.6 oz (43.4 kg)   LMP 02/28/1999   BMI 16.41 kg/m  , BMI Body mass index is 16.41 kg/m. GEN: Well nourished, well developed, in no acute distress HEENT: normal Neck: no JVD, carotid bruits, or masses Cardiac: RRR; no murmurs, rubs, or gallops,no edema  Respiratory:  Clear to auscultation bilaterally, normal work of breathing GI: soft, nontender, nondistended, + BS MS: no deformity or atrophy Skin: warm and dry, no rash Neuro:  Strength and sensation are intact Psych: euthymic mood, anxious    EKG:  No completed this office visit.  Recent Labs: 12/26/2018: TSH 1.510 02/18/2019: ALT 29; B Natriuretic Peptide 1,157.3; Hemoglobin 13.1; Platelets 130 02/21/2019: BUN 21; Creatinine, Ser <0.30; Potassium 3.7; Sodium 131    Lipid Panel  Component Value Date/Time   CHOL 200 (H) 12/26/2018 1525   TRIG 83 12/26/2018 1525   HDL 93 12/26/2018 1525   CHOLHDL 2.2 12/26/2018 1525   LDLCALC 90 12/26/2018 1525      Wt Readings from Last 3 Encounters:  03/03/19 95 lb 9.6 oz (43.4 kg)  02/21/19 100 lb 5 oz (45.5 kg)  01/02/19 102 lb (46.3 kg)      Other studies Reviewed: Echocardiogram 03-04-2019 1. The left ventricle has severely reduced systolic function, with an ejection fraction of 25-30%. The cavity size was severely dilated. Left ventricular diastolic Doppler parameters are consistent with pseudonormalization. Elevated mean left atrial  pressure Left ventrical global hypokinesis without regional wall motion abnormalities. 2. The right ventricle has normal systolic function. The cavity was normal. There is no increase in right ventricular wall thickness. Right ventricular systolic pressure could not be assessed. 3. Left atrial size was mildly dilated. 4. Trivial pericardial effusion is present. 5. The aorta is normal unless otherwise noted. 6. The inferior  vena cava was dilated in size with >50% respiratory variability. 7. No intracardiac thrombi or masses were visualized. 8. When compared to the prior study: there is marked left ventricular dilation and deterioration in left ventricular systolic function since [id number].   ASSESSMENT AND PLAN:  1. Chronic Systolic CHF: Most recent echocardiogram revealed EF of 25%-30%.  She is on carvedilol, spironolactone, and Entresto 24/26 mg. BP is not optimal for current systolic function. Will check BMET today and increase Entresto to 46/51 mg if creatinine is stable. Also to check potassium status on spironolactone. Echo in 3 months with strain measurement. Multiple questions are answered.   2. CAD: Uncertain if CAD is etiology of reduced EF, I have spoken to her about cardiac CTA as recommended during recent hospitalization. She wishes to think about this and talk to her husband before scheduling this. She is asymptomatic concerning angina, or DOE.  Will consider this again on follow up appointment.    3. Hx of breast cancer: Followed by oncology. Received 6 weeks of radiation.    Current medicines are reviewed at length with the patient today.    Labs/ tests ordered today include: BMET  Phill Myron. West Pugh, ANP, AACC   03/03/2019 12:16 PM    [facility] [address] Office [phone] Fax [phone]

## 2019-03-03 ENCOUNTER — Other Ambulatory Visit: Payer: Self-pay

## 2019-03-03 ENCOUNTER — Encounter: Payer: Self-pay | Admitting: Adult Health

## 2019-03-03 ENCOUNTER — Ambulatory Visit: Payer: 59 | Admitting: Adult Health

## 2019-03-03 VITALS — BP 136/81 | HR 82 | Ht 64.0 in | Wt 95.6 lb

## 2019-03-03 DIAGNOSIS — Z79899 Other long term (current) drug therapy: Secondary | ICD-10-CM

## 2019-03-03 DIAGNOSIS — I519 Heart disease, unspecified: Secondary | ICD-10-CM | POA: Diagnosis not present

## 2019-03-03 DIAGNOSIS — R252 Cramp and spasm: Secondary | ICD-10-CM | POA: Diagnosis not present

## 2019-03-03 DIAGNOSIS — I429 Cardiomyopathy, unspecified: Secondary | ICD-10-CM

## 2019-03-03 LAB — BASIC METABOLIC PANEL
BUN/Creatinine Ratio: 29 — ABNORMAL HIGH (ref 12–28)
BUN: 11 mg/dL (ref 8–27)
CO2: 26 mmol/L (ref 20–29)
Calcium: 9.6 mg/dL (ref 8.7–10.3)
Chloride: 94 mmol/L — ABNORMAL LOW (ref 96–106)
Creatinine, Ser: 0.38 mg/dL — ABNORMAL LOW (ref 0.57–1.00)
GFR calc Af Amer: 127 mL/min/{1.73_m2} (ref >59)
GFR calc non Af Amer: 110 mL/min/{1.73_m2} (ref >59)
Glucose: 70 mg/dL (ref 65–99)
Potassium: 4.6 mmol/L (ref 3.5–5.2)
Sodium: 133 mmol/L — ABNORMAL LOW (ref 134–144)

## 2019-03-03 LAB — MAGNESIUM: Magnesium: 1.8 mg/dL (ref 1.6–2.3)

## 2019-03-03 MED ORDER — CARVEDILOL 3.125 MG PO TABS: 3.1250 mg | ORAL_TABLET | Freq: Two times a day (BID) | ORAL | 3 refills | Status: DC

## 2019-03-03 MED ORDER — SACUBITRIL-VALSARTAN 24-26 MG PO TABS: 1.0000 | ORAL_TABLET | Freq: Two times a day (BID) | ORAL | 3 refills | Status: DC

## 2019-03-03 MED ORDER — SPIRONOLACTONE 25 MG PO TABS: 12.5000 mg | ORAL_TABLET | Freq: Every day | ORAL | 3 refills | Status: DC

## 2019-03-03 NOTE — Patient Instructions (Signed)
Medication Instructions:  Continue current medications  If you need a refill on your cardiac medications before your next appointment, please call your pharmacy.  Labwork: BMP and Magnesium HERE IN OUR OFFICE AT [facility]  You will NOT need to fast   Take the provided lab slips with you to the lab for your blood draw.   When you have your labs (blood work) drawn today and your tests are completely normal, you will receive your results only by MyChart Message (if you have MyChart) -OR-  A paper copy in the mail.  If you have any lab test that is abnormal or we need to change your treatment, we will call you to review these results.  Testing/Procedures: Your physician has requested that you have an echocardiogram in 3 Months. Echocardiography is a painless test that uses sound waves to create images of your heart. It provides your doctor with information about the size and shape of your heart and how well your heart's chambers and valves are working. This procedure takes approximately one hour. There are no restrictions for this procedure.  Follow-Up: You will need a follow up appointment in 3 months.  Please call our office 2 months in advance to schedule this appointment.  You may see Dr Carlena Hurl or one of the following Advanced Practice Providers on your designated Care Team:   Rosaria Ferries, PA-C . Jory Sims, DNP, ANP     At [facility], you and your health needs are our priority.  As part of our continuing mission to provide you with exceptional heart care, we have created designated Provider Care Teams.  These Care Teams include your primary Cardiologist (physician) and Advanced Practice Providers (APPs -  Physician Assistants and Nurse Practitioners) who all work together to provide you with the care you need, when you need it.  Thank you for choosing [facility] at [facility]!!

## 2019-03-07 ENCOUNTER — Telehealth: Payer: Self-pay

## 2019-03-07 MED FILL — SPIRONOLACTONE 25 MG TABS: 25 | 30 days supply | Qty: 15 | Fill #1

## 2019-03-07 NOTE — Telephone Encounter (Signed)
Pt request that a "coupon" be sent to pharmacy for coverage of Dexilant.    RN spoke with pharmacy to determine need and coverage.  Per pharmacy, "[employer] not covered."  Recommendations to try other proton pump inhibitors.    RN explained to patient, patient has tried numerous medications with no effectiveness.  RN attempted to help patient apply for co-pay savings card, pt does not qualify due to [employer].  Out of pocket expense exceeds 300 hundred dollars for 30 day supply.    RN explained that we can initiate another prior authorization with information of trying and failing other drugs.  RN educated this may not be approved by insurance due to not being on formulary.  Pt voiced understanding.  Will notify staff to initiate PA.

## 2019-03-12 ENCOUNTER — Other Ambulatory Visit: Payer: Self-pay | Admitting: *Deleted

## 2019-03-12 ENCOUNTER — Telehealth: Payer: Self-pay | Admitting: *Deleted

## 2019-03-12 NOTE — Telephone Encounter (Signed)
Received call from pt stating she needed a "Coupon" for [employer].  RN placed call to pharmacy and was instructed that pt needing PA filled out for insurance to cover medication.  Looking back in the chart on 9/18 RN instructed pt that PA needed to be done.  RN instructed pt today that it can take up to two weeks to hear back from insurance companies for a PA.  Pt verbalized understanding.

## 2019-03-14 ENCOUNTER — Encounter: Payer: Self-pay | Admitting: *Deleted

## 2019-03-18 ENCOUNTER — Telehealth: Payer: Self-pay

## 2019-03-18 ENCOUNTER — Telehealth: Payer: Self-pay | Admitting: *Deleted

## 2019-03-18 NOTE — Telephone Encounter (Signed)
[place] faxed Prior authorization request for Dexilant 60 mg capsules.  Request to [employer] letter tray receptacle of Prior Authorization requests and forms for review on 03-17-2019.

## 2019-03-18 NOTE — Telephone Encounter (Signed)
RN returned call questioning if PA has been approved for medication, Dexilant.   Voicemail left, updating that per PA staff, PA is still pending. Contact information left for additional questions or concerns.

## 2019-03-28 ENCOUNTER — Ambulatory Visit: Payer: 59 | Admitting: Nurse Practitioner

## 2019-04-08 ENCOUNTER — Ambulatory Visit: Payer: 59 | Admitting: Nurse Practitioner

## 2019-04-08 ENCOUNTER — Encounter: Payer: Self-pay | Admitting: Nurse Practitioner

## 2019-04-08 VITALS — BP 118/70 | HR 88 | Temp 98.2°F | Ht 64.0 in | Wt 93.1 lb

## 2019-04-08 DIAGNOSIS — I5022 Chronic systolic (congestive) heart failure: Secondary | ICD-10-CM

## 2019-04-08 DIAGNOSIS — R1310 Dysphagia, unspecified: Secondary | ICD-10-CM

## 2019-04-08 NOTE — Patient Instructions (Signed)
If you are age 68 or older, your body mass index should be between 23-30. Your Body mass index is 15.98 kg/m. If this is out of the aforementioned range listed, please consider follow up with your Primary Care Provider.  If you are age 86 or younger, your body mass index should be between 19-25. Your Body mass index is 15.98 kg/m. If this is out of the aformentioned range listed, please consider follow up with your Primary Care Provider.   It has been recommended to you by your physician that you have an EGD completed. Per your request, we did not schedule the procedure(s) today. Please contact our office at [phone] should you decide to have the procedure completed. You will be scheduled for a pre-visit and procedure at that time.  Thank you for choosing me and [facility].   Tye Savoy, NP

## 2019-04-08 NOTE — Progress Notes (Signed)
Chief Complaint:   dysphagia   IMPRESSION and PLAN:    30. 68 yo female with recurrent solid food dysphagia over last couple of months. Endoscopically it appeared she had candida esophagitis on last EGD done for dysphagia in 2018. Esophageal biopsy however was negative for fungus.  Having problems swallowing several foods such as cookies, cereals, meats.  -Will schedule patient for EGD with possible esophageal dilation. The risks and benefits of EGD were discussed and the patient agrees to proceed.  -Procedure will need to be done at hospital given that she has chronic systolic heart failure with low EF. -Patient originally said she needed to wait until after January. Husband had cardiac arrest earlier this month and she will be busy caring for him and his appointments. She then changed her mind and wanted to do it sooner so we found an appointment slot for 05/19/19. She refused that time due to Hospital requirement that she self-isolate 4 days prior to procedure during [place] pandemic. She wants to wait until after first of year to get the EGD done  2. Colon cancer screening.  Patient has not had a colonoscopy in 20 years, she is adamantly opposed to having one.  PCP apparently gave her a stool test for colon cancer but patient says she has not gotten around to completing the test and mailing it in  3. Chronic systolic heart failure. Last Echo 02/18/19. On Entresto. For follow up Echo in Jan 2021   4. Malnutrition. Her weight is down 8 pounds from 105 lbs in early September to 93 lbs.   HPI:     Patient is a 68 yo female with PMH significant for hypertension, hyperlipidemia, cardiomyopathy , EF 25-30%, breast cancer s/p mastectomy and radiation, GERD, hiatal hernia, PUD.  We saw her January 2018 for evaluation of dysphagia while undergoing radiation for breast cancer.  EGD remarkable for Candida esophagitis and a nonbleeding gastric ulcer.  Biopsies of the ulcer showed inflammation and  exudate.  No H. pylori, no intestinal metaplasia.  Distal esophageal biopsies were actually negative for fungus.  No Barrett's.  Plan was for follow-up EGD to document healing, that was not done for unclear reasons.  Rosalie comes in with recurrent solid food dysphagia.  She does not know exactly when it started happening again but maybe a couple months ago.  Main culprits are cookies, cereals and meat.  No odynophagia.  She has no heartburn or acid regurgitation.  She takes Pepcid on an as-needed basis for "indigestion" . No abdominal pain, no nausea / vomiting.  Mrs. Toburen inquires about having her esophagus stretched again.   Patient has not had a colonoscopy n 20 years, she refuses one now.  She says that PCP gave her a stool test for colon cancer screening but she hasn't gotten around to collecting specimen. Her husband had a cardiac arrest earlier this month and she has been busy caring for him. She says her bowel movements are normal. No blood in stool.     Data Reviewed:  02/18/19 WBC 4.1, hgb 13.1, MCV 93, platelets 130   EGD 07/14/16  Monilial esophagitis. Biopsied. - Small hiatal hernia. - Non-bleeding gastric ulcer with no stigmata of bleeding. Biopsied. - Normal examined duodenum.' Diagnosis 1. Surgical [P], gastric ulcer - INFLAMED GASTRIC MUCOSA AND EXUDATE CONSISTENT WITH ULCER. - WARTHIN-STARRY STAIN NEGATIVE FOR HELICOBACTER PYLORI. - NO INTESTINAL METAPLASIA, DYSPLASIA OR MALIGNANCY. 2. Surgical [P], distal esophagus - BENIGN SQUAMOUS MUCOSA. - PAS  STAIN NEGATIVE FOR FUNGUS. - NO INTESTINAL METAPLASIA, DYSPLASIA OR MALIGNANCY.   Review of systems:     No chest pain, no SOB, no fevers, no urinary sx   Past Medical History:  Diagnosis Date  . Anxiety   . Arthritis    IN BACK....  . Breast cancer ([place]) 2016  . 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 Complication of anesthesia    "HARD TO WAKE UP".......Marland KitchenN/V  . Cricopharyngeus muscle dysfunction   . Gastric  ulcer   . GERD (gastroesophageal reflux disease)   . Hiatal hernia   . Hyperlipidemia   . Hypertension   . PONV (postoperative nausea and vomiting)   . Scoliosis   . Urinary frequency     Patient's surgical history, family medical history, social history, medications and allergies were all reviewed in Epic    Current Outpatient Medications  Medication Sig Dispense Refill  . atorvastatin (LIPITOR) 80 MG tablet Take 1 tablet (80 mg total) by mouth daily at 6 PM. 30 tablet 2  . carvedilol (COREG) 3.125 MG tablet Take 1 tablet (3.125 mg total) by mouth 2 (two) times daily with a meal. 180 tablet 3  . estradiol (ESTRACE) 1 MG tablet TAKE 1 TABLET BY MOUTH EVERY DAY (Patient taking differently: Take 1 mg by mouth daily. ) 90 tablet 3  . famotidine (PEPCID) 20 MG tablet Take 20 mg by mouth 2 (two) times daily as needed for heartburn or indigestion.    . lidocaine (LIDODERM) 5 % Place 1 patch onto the skin daily. 30 patch 2  . polyvinyl alcohol (LIQUIFILM TEARS) 1.4 % ophthalmic solution Place 1 drop into both eyes as needed for dry eyes.    . sacubitril-valsartan (ENTRESTO) 24-26 MG Take 1 tablet by mouth 2 (two) times daily. 180 tablet 3  . spironolactone (ALDACTONE) 25 MG tablet Take 0.5 tablets (12.5 mg total) by mouth daily. 90 tablet 3  . tolterodine (DETROL LA) 2 MG 24 hr capsule Take 1 capsule (2 mg total) by mouth daily. 90 capsule 1   No current facility-administered medications for this visit.     Physical Exam:     Temp 98.2 F (36.8 C)   Ht 5\' 4"  (1.626 m) Comment: height measured without shoes  Wt 93 lb 2 oz (42.2 kg)   LMP 02/28/1999   BMI 15.98 kg/m   GENERAL:  Emaciated female in NAD PSYCH: : Cooperative, normal affect EENT:  conjunctiva pink, mucous membranes moist, neck supple without masses CARDIAC:  RRR,  no peripheral edema PULM: Normal respiratory effort, lungs CTA bilaterally, no wheezing ABDOMEN:  Nondistended, soft, nontender. No obvious masses, no  hepatomegaly,  normal bowel sounds SKIN:  turgor, no lesions seen Musculoskeletal:  Normal muscle tone, normal strength NEURO: Alert and oriented x 3, no focal neurologic deficits   Tye Savoy , NP 04/08/2019, 1:28 PM

## 2019-04-17 MED FILL — ENTRESTO 24 MG-26 MG TABLET: 24-26 | 30 days supply | Qty: 60 | Fill #2

## 2019-04-17 MED FILL — ATORVASTATIN 80 MG TABLET: 80 | 30 days supply | Qty: 30 | Fill #2

## 2019-04-17 MED FILL — CARVEDILOL 3.125 MG TABLET: 3.125 | 30 days supply | Qty: 60 | Fill #2

## 2019-05-13 ENCOUNTER — Other Ambulatory Visit: Payer: Self-pay | Admitting: Adult Health

## 2019-05-13 MED ORDER — CARVEDILOL 3.125 MG PO TABS: 3.1250 mg | ORAL_TABLET | Freq: Two times a day (BID) | ORAL | 0 refills | Status: DC

## 2019-05-13 MED ORDER — SACUBITRIL-VALSARTAN 24-26 MG PO TABS: 1.0000 | ORAL_TABLET | Freq: Two times a day (BID) | ORAL | 0 refills | Status: DC

## 2019-05-13 MED ORDER — ATORVASTATIN CALCIUM 80 MG PO TABS: 80.0000 mg | ORAL_TABLET | Freq: Every day | ORAL | 0 refills | Status: DC

## 2019-05-13 NOTE — Telephone Encounter (Signed)
°*  STAT* If patient is at the pharmacy, call can be transferred to refill team.   1. Which medications need to be refilled? (please list name of each medication and dose if known) atorvastatin (LIPITOR) 80 MG tablet carvedilol (COREG) 3.125 MG tablet sacubitril-valsartan (ENTRESTO) 24-26 MG  2. Which pharmacy/location (including street and city if local pharmacy) is medication to be sent to? [place], [place]  3. Do they need a 30 day or 90 day supply? [place]

## 2019-06-09 ENCOUNTER — Ambulatory Visit: Payer: 59 | Admitting: Family

## 2019-06-11 ENCOUNTER — Telehealth: Payer: Self-pay | Admitting: *Deleted

## 2019-06-11 NOTE — Telephone Encounter (Signed)
    It appears the patient has been followed by the [facility] office (Dr. Gardiner Rhyme and Jory Sims, [place]) with her last evaluation being in 02/2019. Weight was 95 lbs at that. It appear she is scheduled for a repeat echo next month in the setting of her cardiomyopathy. Was suppose to have a Coronary CT following her hospitalization but by review of notes she wished to postpone it at that time.   In talking with nursing staff, she is wanting to switch to [place]. If so, would see if she can come in tomorrow morning for a visit around 10:00 since the office closes at noon. If wanting to follow-up with [facility], will need to reach to scheduling to get this arranged. If unable to come tomorrow, would at least see if we can get her follow-up echo moved up and a visit arranged after this is performed.   Signed, Erma Heritage, PA-C 06/11/2019, 4:29 PM Pager: [phone]

## 2019-06-11 NOTE — Telephone Encounter (Signed)
Pt c/o cough, SOB, weakness for a month. States that is worse since Mon. Current weight is 94 lbs. Denies chest pain at this time. Was seen at [facility] urgent care on Monday and tested for COVID and Flu and states that she was negative for both. Please advise.

## 2019-06-11 NOTE — Telephone Encounter (Signed)
Pt scheduled for 1000 on tomorrow.

## 2019-06-12 ENCOUNTER — Encounter: Payer: Self-pay | Admitting: Student

## 2019-06-12 ENCOUNTER — Ambulatory Visit (INDEPENDENT_AMBULATORY_CARE_PROVIDER_SITE_OTHER): Payer: 59 | Admitting: Student

## 2019-06-12 ENCOUNTER — Other Ambulatory Visit: Payer: Self-pay

## 2019-06-12 VITALS — BP 133/74 | HR 90 | Temp 96.9°F | Wt 94.2 lb

## 2019-06-12 DIAGNOSIS — E785 Hyperlipidemia, unspecified: Secondary | ICD-10-CM | POA: Diagnosis not present

## 2019-06-12 DIAGNOSIS — I5022 Chronic systolic (congestive) heart failure: Secondary | ICD-10-CM

## 2019-06-12 DIAGNOSIS — I519 Heart disease, unspecified: Secondary | ICD-10-CM

## 2019-06-12 MED ORDER — FUROSEMIDE 20 MG PO TABS: 20.0000 mg | ORAL_TABLET | Freq: Every day | ORAL | 3 refills | Status: DC

## 2019-06-12 NOTE — Progress Notes (Signed)
Cardiology Office Note    Date:  06/12/2019   ID:  Sherry Bender, DOB 1951/05/12, MRN [id number]  PCP:  Sherry Balloon, FNP  Cardiologist: Followed by Dr. Gardiner Rhyme during prior admission --> wishes to follow with MD in [place]  Patient presents with  . Follow-up    worsening dyspnea and weakness    History of Present Illness:    Sherry Bender is a 68 y.o. female with past medical history of chronic systolic CHF (EF [id number] by echocardiogram in 02/2019), HLD and history of breast cancer (s/p mastectomy, chemotherapy and radiation) who presents to the office today for evaluation of worsening dyspnea.   She was admitted to [facility] in 02/2019 for evaluation of worsening dyspnea and edema, found to have an acute CHF exacerbation with LVEF reduced to 25 to 30%. Was also treated for CAP during admission. Weight was 105.6 lbs at the time of discharge and she was placed on Coreg 3.125 mg twice daily, Spironolactone 12.5 mg daily and Entresto 24-26mg  BID. Ischemic evaluation was not pursued during admission but outpatient Coronary CT was recommended.  She did follow-up with Sherry Sims, DNP on 03/03/2019 and denied any recent chest pain or dyspnea on exertion. She had previously been experiencing significant muscle cramps while on Lasix and this had been discontinued along with Spironolactone. It was recommended she have a repeat BMET at the time of her visit with instructions to increase Entresto if renal function remains stable. She was in agreement for a repeat echocardiogram in 3 months and she wished to think about a Coronary CTA before having this scheduled.   She called the office on 06/11/2019 reporting worsening dyspnea and weakness for the past month. A follow-up visit was therefore arranged.  In talking with the patient and her husband today, she reports worsening dyspnea and fatigue for the past month. She reports getting short of breath with minimal  activity but over the past few weeks she has also experienced worsening orthopnea and is having to sleep with many pillows. Last night, she had to sit up and walk around due to her dyspnea. Denies any associated edema or abdominal distention. She does describe a pressure along her chest when she cannot breathe at night but this improves when she sits up and walks around. No recent exertional chest pain. She denies any palpitations.  Was tested for Flu and COVID at Urgent Care earlier this week and both resulted as negative. Was given a short course of antibiotics along with Prednisone taper but she has not experienced significant improvement thus far.    Past Medical History:  Diagnosis Date  . Anxiety   . Arthritis    IN BACK....  . Breast cancer ([place]) 2016  . CHF (congestive heart failure) ([place])   . CHF (congestive heart failure) ([facility])    a. EF 25-30% by echocardiogram in 02/2019  . Complication of anesthesia    "HARD TO WAKE UP".......Marland KitchenN/V  . Cricopharyngeus muscle dysfunction   . Gastric ulcer   . GERD (gastroesophageal reflux disease)   . Hiatal hernia   . Hyperlipidemia   . Hypertension   . PONV (postoperative nausea and vomiting)   . Scoliosis   . 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 Urinary frequency     Past Surgical History:  Procedure Laterality Date  . ABDOMINAL HYSTERECTOMY  2000   TAH/BSO  . ANTERIOR CERVICAL DECOMP/DISCECTOMY FUSION N/A 06/18/2014   Procedure: C4-5 C5-6  C6-7 ANTERIOR CERVICAL DECOMPRESSION/DISKECTOMY/FUSION;  Surgeon: Sherry Moore, MD;  Location: [place] [facility];  Service: Neurosurgery;  Laterality: N/A;  C4-5 C5-6 C6-7 ANTERIOR CERVICAL DECOMPRESSION/DISKECTOMY/FUSION  . ANTERIOR CERVICAL DECOMP/DISCECTOMY FUSION N/A 06/25/2014   Procedure: EXPLORATION OF CERVICAL FUSION WITH REVIOSN OF HARDWARE;  Surgeon: Sherry Moore, MD;  Location: [place] [facility] OR[facility];  Service: Neurosurgery;  Laterality: N/A;  . CERVICAL FUSION    . CHOLECYSTECTOMY  1994  . COMPLETE  MASTECTOMY W/ SENTINEL NODE BIOPSY Left 04/30/2015  . FOOT NEUROMA SURGERY  01/2010   also had metatarasl shortened  . KNEE ARTHROSCOPY  2005   right knee  . MASTECTOMY Left 2016  . PORT-A-CATH REMOVAL Right 04/30/2015   Procedure: REMOVAL PORT-A-CATH;  Surgeon: Sherry Skates, MD;  Location: [place];  Service: General;  Laterality: Right;  . PORTACATH PLACEMENT N/A 11/23/2014   Procedure: INSERTION PORT-A-CATH WITH ULTRASOUND;  Surgeon: Sherry Skates, MD;  Location: [place];  Service: General;  Laterality: N/A;  . RHINOPLASTY    . SIMPLE MASTECTOMY WITH AXILLARY SENTINEL NODE BIOPSY Left 04/30/2015   Procedure: LEFT TOTAL  MASTECTOMY WITH LEFT AXILLARY SENTINEL NODE BIOPSY;  Surgeon: Sherry Skates, MD;  Location: [place];  Service: General;  Laterality: Left;    Current Medications: Outpatient Medications Prior to Visit  Medication Sig Dispense Refill  . atorvastatin (LIPITOR) 80 MG tablet Take 1 tablet (80 mg total) by mouth daily at 6 PM. Due for follow up appt 90 tablet 0  . carvedilol (COREG) 3.125 MG tablet Take 1 tablet (3.125 mg total) by mouth 2 (two) times daily with a meal. Due for follow up appt 180 tablet 0  . estradiol (ESTRACE) 1 MG tablet TAKE 1 TABLET BY MOUTH EVERY DAY (Patient taking differently: Take 1 mg by mouth daily. ) 90 tablet 3  . famotidine (PEPCID) 20 MG tablet Take 20 mg by mouth 2 (two) times daily as needed for heartburn or indigestion.    . lidocaine (LIDODERM) 5 % Place 1 patch onto the skin daily. 30 patch 2  . polyvinyl alcohol (LIQUIFILM TEARS) 1.4 % ophthalmic solution Place 1 drop into both eyes as needed for dry eyes.    . sacubitril-valsartan (ENTRESTO) 24-26 MG Take 1 tablet by mouth 2 (two) times daily. Due for follow up appt 180 tablet 0  . tolterodine (DETROL LA) 2 MG 24 hr capsule Take 1 capsule (2 mg total) by mouth daily. 90 capsule 1  . spironolactone (ALDACTONE) 25 MG tablet Take 0.5 tablets (12.5 mg total) by mouth daily. 90  tablet 3   No facility-administered medications prior to visit.     Allergies:   Codeine   Social History   Socioeconomic History  . Marital status: Married    Spouse name: Not on file  . Number of children: 2  . Years of education: Not on file  . Highest education level: Not on file  Occupational History  . Not on file  Tobacco Use  . Smoking status: Former Smoker    Packs/day: 0.25    Years: 42.00    Pack years: 10.50    Types: Cigarettes    Quit date: 01/24/2017    Years since quitting: 2.3  . Smokeless tobacco: Never Used  Substance and Sexual Activity  . Alcohol use: No  . Drug use: No  . Sexual activity: Never  Other Topics Concern  . 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     Two daughters born 47 and 20- one in [place] one in [place].             Social Determinants of Health   Financial Resource Strain:   . Difficulty of Paying Living Expenses: Not on file  Food Insecurity:   . Worried About Charity fundraiser in the Last Year: Not on file  . Ran Out of Food in the Last Year: Not on file  Transportation Needs:   . Lack of Transportation (Medical): Not on file  . Lack of Transportation (Non-Medical): Not on file  Physical Activity:   . Days of Exercise per Week: Not on file  . Minutes of Exercise per Session: Not on file  Stress:   . Feeling of Stress : Not on file  Social Connections:   . Frequency of Communication with Friends and Family: Not on file  . Frequency of Social Gatherings with Friends and Family: Not on file  . Attends Religious Services: Not on file  . Active Member of Clubs or Organizations: Not on file  . Attends Archivist Meetings: Not on file  . Marital Status: Not on file     Family History:  The patient's family history includes Cancer in her maternal grandmother and  paternal aunt; Diabetes in her mother; Heart disease in her father and mother; Hypertension in her father and mother.   Review of Systems:   Please see the history of present illness.     General:  No chills, fever, night sweats or weight changes.  Cardiovascular:  No chest pain, edema or palpitations. Positive for dyspnea on exertion, orthopnea and PND.  Dermatological: No rash, lesions/masses Respiratory: No cough, dyspnea Urologic: No hematuria, dysuria Abdominal:   No nausea, vomiting, diarrhea, bright red blood per rectum, melena, or hematemesis Neurologic:  No visual changes, wkns, changes in mental status. All other systems reviewed and are otherwise negative except as noted above.   Physical Exam:    VS:  BP 133/74   Pulse 90   Temp (!) 96.9 F (36.1 C) (Skin)   Wt 94 lb 3.2 oz (42.7 kg)   LMP 02/28/1999   SpO2 100%   BMI 16.17 kg/m    General: Thin appearing Caucasian female, currently in no acute distress. Head: Normocephalic, atraumatic, sclera non-icteric, no xanthomas, nares are without discharge.  Neck: No carotid bruits. JVD not elevated.  Lungs: Respirations regular and unlabored, mild rales along bases bilaterally Heart: Regular rate and rhythm. No S3 or S4.  No murmur, no rubs, or gallops appreciated. Abdomen: Soft, non-tender, non-distended with normoactive bowel sounds. No hepatomegaly. No rebound/guarding. No obvious abdominal masses. Msk:  Strength and tone appear normal for age. No joint deformities or effusions. Extremities: No clubbing or cyanosis. No lower extremity edema.  Distal pedal pulses are 2+ bilaterally. Neuro: Alert and oriented X 3. Moves all extremities spontaneously. No focal deficits noted. Psych:  Responds to questions appropriately with a normal affect. Skin: No rashes or lesions noted  Wt Readings from Last 3 Encounters:  06/12/19 94 lb 3.2 oz (42.7 kg)  04/08/19 93 lb 2 oz (42.2 kg)  03/03/19 95 lb 9.6 oz (43.4 kg)      Studies/Labs Reviewed:   EKG:  EKG is not ordered today. EKG from 02/18/2019 is reviewed which shows NSR, HR 96 with LAD and atrial enlargement.  Recent Labs: 12/26/2018: TSH 1.510 02/18/2019: ALT 29; B Natriuretic Peptide 1,157.3; Hemoglobin 13.1; Platelets 130 03/03/2019: BUN 11; Creatinine, Ser 0.38; Magnesium 1.8; Potassium 4.6; Sodium 133   Lipid Panel    Component Value Date/Time   CHOL 200 (H) 12/26/2018 1525   TRIG 83 12/26/2018 1525   HDL 93 12/26/2018 1525   CHOLHDL 2.2 12/26/2018 1525   LDLCALC 90 12/26/2018 1525    Additional studies/ records that were reviewed today include:   Echocardiogram: 02/2019 IMPRESSIONS    1. The left ventricle has severely reduced systolic function, with an ejection fraction of 25-30%. The cavity size was severely dilated. Left ventricular diastolic Doppler parameters are consistent with pseudonormalization. Elevated mean left atrial  pressure Left ventrical global hypokinesis without regional wall motion abnormalities.  2. The right ventricle has normal systolic function. The cavity was normal. There is no increase in right ventricular wall thickness. Right ventricular systolic pressure could not be assessed.  3. Left atrial size was mildly dilated.  4. Trivial pericardial effusion is present.  5. The aorta is normal unless otherwise noted.  6. The inferior vena cava was dilated in size with >50% respiratory variability.  7. No intracardiac thrombi or masses were visualized.  8. When compared to the prior study: there is marked left ventricular dilation and deterioration in left ventricular systolic function since [id number].   Assessment:    1. Chronic systolic congestive heart failure ([place])   2. LV dysfunction   3. Hyperlipidemia, unspecified hyperlipidemia type      Plan:   In order of problems listed above:  1. Chronic Systolic CHF/LV Dysfunction - She has a known reduced EF 25 to 30% by echocardiogram in 02/2019 with unknown  etiology. She presents today with worsening dyspnea on exertion and fatigue over the past several weeks with associated orthopnea and PND for the past week. She does have mild rales on examination but no abdominal distention or pitting edema. She was previously intolerant to Lasix 40 mg daily but was also on Spironolactone at that time. Will write for Rx of Lasix 20 mg daily to see if she tolerates this well. Recommended that she follow daily weights. Will plan for a limited echocardiogram next week to reassess LV function and wall motion along with ruling out a pericardial effusion (no friction rub appreciated on examination today). Will obtain repeat BNP and BMET that day. If EF remains reduced, she is now in agreement with proceeding with a Coronary CT for ischemic evaluation.  Would also consider further titration of Coreg pending echo results.   - Continue Coreg 3.125 mg twice daily and Entresto 24-26 mg twice daily at this time.  2. HLD - Followed by PCP. FLP in 12/2018 showed total cholesterol 200, triglycerides 83, HDL 93 and LDL 90. She remains on Atorvastatin 80 mg daily.   Medication Adjustments/Labs and Tests Ordered: Current medicines are reviewed at length with the patient today.  Concerns regarding medicines are outlined above.  Medication changes, Labs and Tests ordered today are listed in the Patient Instructions below. Patient Instructions  Medication Instructions:  Your physician has recommended you make the following change in your medication:  Start Lasix 20 mg Daily   *If you need a refill on your cardiac medications before your next appointment, please call your pharmacy*  Lab Work: Your physician recommends that you return for lab work in: The Day of the Echo  If you have labs (blood work) drawn today and your tests are completely normal,  you will receive your results only by: Marland Kitchen MyChart Message (if you have MyChart) OR . A paper copy in the mail If you have any lab test  that is abnormal or we need to change your treatment, we will call you to review the results.  Testing/Procedures: Your physician has requested that you have an echocardiogram. Echocardiography is a painless test that uses sound waves to create images of your heart. It provides your doctor with information about the size and shape of your heart and how well your heart's chambers and valves are working. This procedure takes approximately one hour. There are no restrictions for this procedure.   Follow-Up: At [facility], you and your health needs are our priority.  As part of our continuing mission to provide you with exceptional heart care, we have created designated Provider Care Teams.  These Care Teams include your primary Cardiologist (physician) and Advanced Practice Providers (APPs -  Physician Assistants and Nurse Practitioners) who all work together to provide you with the care you need, when you need it.  Your next appointment:   4 week(s)  The format for your next appointment:   In Person  Provider:   You may see No primary care provider on file. or one of the following Advanced Practice Providers on your designated Care Team:    Bernerd Pho, PA-C   Ermalinda Barrios, [place]    Other Instructions Thank you for choosing [facility]!    Signed, Erma Heritage, PA-C  06/12/2019 1:01 PM    [facility] [address] [place], [place] [address] Phone: [phone] Fax: [phone]

## 2019-06-12 NOTE — Patient Instructions (Signed)
Medication Instructions:  Your physician has recommended you make the following change in your medication:  Start Lasix 20 mg Daily   *If you need a refill on your cardiac medications before your next appointment, please call your pharmacy*  Lab Work: Your physician recommends that you return for lab work in: The Day of the Echo  If you have labs (blood work) drawn today and your tests are completely normal, you will receive your results only by: Marland Kitchen MyChart Message (if you have MyChart) OR . A paper copy in the mail If you have any lab test that is abnormal or we need to change your treatment, we will call you to review the results.  Testing/Procedures: Your physician has requested that you have an echocardiogram. Echocardiography is a painless test that uses sound waves to create images of your heart. It provides your doctor with information about the size and shape of your heart and how well your heart's chambers and valves are working. This procedure takes approximately one hour. There are no restrictions for this procedure.   Follow-Up: At [facility], you and your health needs are our priority.  As part of our continuing mission to provide you with exceptional heart care, we have created designated Provider Care Teams.  These Care Teams include your primary Cardiologist (physician) and Advanced Practice Providers (APPs -  Physician Assistants and Nurse Practitioners) who all work together to provide you with the care you need, when you need it.  Your next appointment:   4 week(s)  The format for your next appointment:   In Person  Provider:   You may see No primary care provider on file. or one of the following Advanced Practice Providers on your designated Care Team:    Bernerd Pho, PA-C   Ermalinda Barrios, [place]    Other Instructions Thank you for choosing [facility]!

## 2019-06-17 MED FILL — ENTRESTO 24 MG-26 MG TABLET: 24-26 | 30 days supply | Qty: 60 | Fill #1

## 2019-06-17 MED FILL — ATORVASTATIN 80 MG TABLET: 80 | 30 days supply | Qty: 30 | Fill #1

## 2019-06-17 MED FILL — CARVEDILOL 3.125 MG TABLET: 3.125 | 30 days supply | Qty: 60 | Fill #1

## 2019-06-18 ENCOUNTER — Other Ambulatory Visit: Payer: Self-pay

## 2019-06-18 ENCOUNTER — Other Ambulatory Visit (HOSPITAL_COMMUNITY)
Admission: RE | Admit: 2019-06-18 | Discharge: 2019-06-18 | Disposition: A | Discharge status: Home / Self Care | Payer: 59 | Source: Ambulatory Visit | Attending: Student | Admitting: Student

## 2019-06-18 ENCOUNTER — Ambulatory Visit (HOSPITAL_COMMUNITY)
Admission: RE | Admit: 2019-06-18 | Discharge: 2019-06-18 | Disposition: A | Discharge status: Home / Self Care | Payer: 59 | Source: Ambulatory Visit | Attending: Adult Health | Admitting: Adult Health

## 2019-06-18 DIAGNOSIS — E785 Hyperlipidemia, unspecified: Secondary | ICD-10-CM | POA: Insufficient documentation

## 2019-06-18 DIAGNOSIS — I110 Hypertensive heart disease with heart failure: Secondary | ICD-10-CM | POA: Insufficient documentation

## 2019-06-18 DIAGNOSIS — I519 Heart disease, unspecified: Secondary | ICD-10-CM

## 2019-06-18 DIAGNOSIS — F17200 Nicotine dependence, unspecified, uncomplicated: Secondary | ICD-10-CM | POA: Insufficient documentation

## 2019-06-18 DIAGNOSIS — I509 Heart failure, unspecified: Secondary | ICD-10-CM | POA: Insufficient documentation

## 2019-06-18 DIAGNOSIS — I429 Cardiomyopathy, unspecified: Secondary | ICD-10-CM | POA: Insufficient documentation

## 2019-06-18 LAB — BRAIN NATRIURETIC PEPTIDE: B Natriuretic Peptide: 46 pg/mL (ref 0.0–100.0)

## 2019-06-18 LAB — BASIC METABOLIC PANEL
Anion gap: 9 (ref 5–15)
BUN: 11 mg/dL (ref 8–23)
CO2: 29 mmol/L (ref 22–32)
Calcium: 9.1 mg/dL (ref 8.9–10.3)
Chloride: 92 mmol/L — ABNORMAL LOW (ref 98–111)
Creatinine, Ser: <0.3 mg/dL — ABNORMAL LOW (ref 0.44–1.00)
Glucose, Bld: 87 mg/dL (ref 70–99)
Potassium: 4.8 mmol/L (ref 3.5–5.1)
Sodium: 130 mmol/L — ABNORMAL LOW (ref 135–145)

## 2019-06-18 NOTE — Progress Notes (Signed)
*  PRELIMINARY RESULTS* Echocardiogram 2D Echocardiogram has been performed.  Samuel Germany 06/18/2019, 5:07 PM

## 2019-06-25 ENCOUNTER — Telehealth: Payer: Self-pay | Admitting: *Deleted

## 2019-06-25 DIAGNOSIS — R079 Chest pain, unspecified: Secondary | ICD-10-CM

## 2019-06-25 MED ORDER — METOPROLOL TARTRATE 100 MG PO TABS: ORAL_TABLET | ORAL | 0 refills | Status: DC

## 2019-06-25 NOTE — Telephone Encounter (Signed)
-----   Message from Erma Heritage, [place] sent at 06/19/2019  7:59 AM EST ----- Please let the patient know that her fluid level is now within a normal range.  Kidney function and potassium remain stable. Sodium is slightly low and consistent with prior values. Have her symptoms improved with the use of Lasix? Her echocardiogram has not officially resulted to me but EF remains reduced as this was 25 to 30% in 02/2019, at 30-35% currently (normal is 55-65%).  Would recommend proceeding with a coronary CT as discussed at her last office visit to rule out any significant ischemia.

## 2019-06-27 ENCOUNTER — Other Ambulatory Visit: Payer: Self-pay

## 2019-06-27 DIAGNOSIS — I208 Other forms of angina pectoris: Secondary | ICD-10-CM

## 2019-06-27 NOTE — Addendum Note (Signed)
Addended by: Levonne Hubert on: 06/27/2019 03:29 PM   Modules accepted: Orders

## 2019-06-27 NOTE — Addendum Note (Signed)
Addended by: Levonne Hubert on: 06/27/2019 04:12 PM   Modules accepted: Orders

## 2019-06-30 ENCOUNTER — Encounter: Payer: Self-pay | Admitting: *Deleted

## 2019-07-01 ENCOUNTER — Telehealth: Payer: Self-pay | Admitting: *Deleted

## 2019-07-01 NOTE — Telephone Encounter (Signed)
-----   Message from Lendon Colonel, NP sent at 06/22/2019 10:09 AM EST ----- Labs reviewed. Will need follow up  with Dr. Alda Lea for management.

## 2019-07-02 ENCOUNTER — Telehealth: Payer: Self-pay | Admitting: Student

## 2019-07-02 NOTE — Telephone Encounter (Signed)
Pt called wanting to know when she would be scheduled for her CT.   Pt is worried that her husband will not have enough time to ask off of work to be able to bring her.  Please give pt a call and update her w/ the process and how long it will be before she's scheduled.   Thank you

## 2019-07-02 NOTE — Telephone Encounter (Signed)
Thank you Romie Minus    I will call her

## 2019-07-04 ENCOUNTER — Other Ambulatory Visit (HOSPITAL_COMMUNITY): Payer: 59

## 2019-07-15 ENCOUNTER — Encounter: Payer: Self-pay | Admitting: Student

## 2019-07-15 ENCOUNTER — Other Ambulatory Visit: Payer: Self-pay

## 2019-07-15 ENCOUNTER — Ambulatory Visit (INDEPENDENT_AMBULATORY_CARE_PROVIDER_SITE_OTHER): Payer: 59 | Admitting: Student

## 2019-07-15 VITALS — BP 142/71 | HR 75 | Temp 97.9°F | Ht 64.0 in | Wt 92.8 lb

## 2019-07-15 DIAGNOSIS — I429 Cardiomyopathy, unspecified: Secondary | ICD-10-CM

## 2019-07-15 DIAGNOSIS — I5022 Chronic systolic (congestive) heart failure: Secondary | ICD-10-CM

## 2019-07-15 DIAGNOSIS — I208 Other forms of angina pectoris: Secondary | ICD-10-CM

## 2019-07-15 DIAGNOSIS — E785 Hyperlipidemia, unspecified: Secondary | ICD-10-CM

## 2019-07-15 MED ORDER — CARVEDILOL 6.25 MG PO TABS: 6.2500 mg | ORAL_TABLET | Freq: Two times a day (BID) | ORAL | 3 refills | Status: DC

## 2019-07-15 NOTE — Patient Instructions (Signed)
Medication Instructions:  Your physician has recommended you make the following change in your medication:  Increase Coreg to 6.25 Two Times Daily   *If you need a refill on your cardiac medications before your next appointment, please call your pharmacy*  Lab Work: NONE   If you have labs (blood work) drawn today and your tests are completely normal, you will receive your results only by: Marland Kitchen MyChart Message (if you have MyChart) OR . A paper copy in the mail If you have any lab test that is abnormal or we need to change your treatment, we will call you to review the results.  Testing/Procedures: NONE   Follow-Up: At [facility], you and your health needs are our priority.  As part of our continuing mission to provide you with exceptional heart care, we have created designated Provider Care Teams.  These Care Teams include your primary Cardiologist (physician) and Advanced Practice Providers (APPs -  Physician Assistants and Nurse Practitioners) who all work together to provide you with the care you need, when you need it.  Your next appointment:   6 week(s)  The format for your next appointment:   In Person  Provider:   You may see Dr. Domenic Polite or one of the following Advanced Practice Providers on your designated Care Team:    Bernerd Pho, PA-C   Ermalinda Barrios, PA-C    Other Instructions Thank you for choosing [facility]!

## 2019-07-15 NOTE — Progress Notes (Signed)
Cardiology Office Note    Date:  07/15/2019   ID:  Sherry Bender, DOB 12/19/1950, MRN [id number]  PCP:  Sharion Balloon, FNP  Cardiologist: Followed by Dr. Gardiner Rhyme during prior admission --> wishes to follow with MD in [facility] (patient requests Dr. Domenic Polite)  Chief Complaint  Patient presents with  . Follow-up    History of Present Illness:    Sherry Bender is a 69 y.o. female with past medical history of chronic systolic CHF (EF 25 to [id number] by echo in 02/2019, at 30-35% by repeat echo in 06/2019), history of breast cancer (s/p mastectomy and radiation) and HLD who presents to the office today for 1 month follow-up.  She was last examined by myself in 05/2019 and reported worsening dyspnea and fatigue for the past month with associated orthopnea. She denied any associated chest pain or palpitations. She had recently been tested for the Influenza along with COVID and was negative for both. Repeat lab work along with a follow-up echocardiogram was recommended with consideration of a Coronary CT if EF remained reduced (patient had previously declined CT). She was continued on Coreg 3.125 mg twice daily along with Entresto 24-26mg  BID with Lasix 20mg  daily added to her regimen (previously intolerant to higher dosing but on Spironolactone at that time as well). Echocardiogram showed EF remained reduced at 30 to 35% with global hypokinesis, therefore it was recommended to proceed with Coronary CT.   In talking with the patient today, she reports still having dyspnea on exertion with routine household activities such as washing dishes or making her bed. Says she has to stop and rest to catch her breath but reports symptoms have overall improved since restarting Lasix. Has baseline 2-pillow orthopnea but no PND or lower extremity edema. She denies any palpitations, dizziness or presyncope. She does report intermittent pain along her left pectoral region but says this has been occurring since  her prior mastectomy and her pain is typically along her surgical scar.   She has been checking BP at home and reports this has been well-controlled. SBP has not been less than 100.   Past Medical History:  Diagnosis Date  . Anxiety   . Arthritis    IN BACK....  . Breast cancer ([place]) 2016  . CHF (congestive heart failure) ([place])   . CHF (congestive heart failure) ([facility])    a. EF 25-30% by echocardiogram in 02/2019  . Complication of anesthesia    "HARD TO WAKE UP".......Marland KitchenN/V  . Cricopharyngeus muscle dysfunction   . Gastric ulcer   . GERD (gastroesophageal reflux disease)   . Hiatal hernia   . Hyperlipidemia   . Hypertension   . PONV (postoperative nausea and vomiting)   . Scoliosis   . 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 Urinary frequency     Past Surgical History:  Procedure Laterality Date  . ABDOMINAL HYSTERECTOMY  2000   TAH/BSO  . ANTERIOR CERVICAL DECOMP/DISCECTOMY FUSION N/A 06/18/2014   Procedure: C4-5 C5-6 C6-7 ANTERIOR CERVICAL DECOMPRESSION/DISKECTOMY/FUSION;  Surgeon: Eustace Moore, MD;  Location: [facility];  Service: Neurosurgery;  Laterality: N/A;  C4-5 C5-6 C6-7 ANTERIOR CERVICAL DECOMPRESSION/DISKECTOMY/FUSION  . ANTERIOR CERVICAL DECOMP/DISCECTOMY FUSION N/A 06/25/2014   Procedure: EXPLORATION OF CERVICAL FUSION WITH REVIOSN OF HARDWARE;  Surgeon: Eustace Moore, MD;  Location: [facility];  Service: Neurosurgery;  Laterality: N/A;  . CERVICAL FUSION    . CHOLECYSTECTOMY  1994  . COMPLETE MASTECTOMY W/ SENTINEL NODE BIOPSY  Left 04/30/2015  . FOOT NEUROMA SURGERY  01/2010   also had metatarasl shortened  . KNEE ARTHROSCOPY  2005   right knee  . MASTECTOMY Left 2016  . PORT-A-CATH REMOVAL Right 04/30/2015   Procedure: REMOVAL PORT-A-CATH;  Surgeon: Fanny Skates, MD;  Location: [facility];  Service: General;  Laterality: Right;  . PORTACATH PLACEMENT N/A 11/23/2014   Procedure: INSERTION PORT-A-CATH WITH ULTRASOUND;  Surgeon: Fanny Skates, MD;  Location:  [facility];  Service: General;  Laterality: N/A;  . RHINOPLASTY    . SIMPLE MASTECTOMY WITH AXILLARY SENTINEL NODE BIOPSY Left 04/30/2015   Procedure: LEFT TOTAL  MASTECTOMY WITH LEFT AXILLARY SENTINEL NODE BIOPSY;  Surgeon: Fanny Skates, MD;  Location: [facility];  Service: General;  Laterality: Left;    Current Medications: Outpatient Medications Prior to Visit  Medication Sig Dispense Refill  . atorvastatin (LIPITOR) 80 MG tablet Take 1 tablet (80 mg total) by mouth daily at 6 PM. Due for follow up appt 90 tablet 0  . estradiol (ESTRACE) 1 MG tablet TAKE 1 TABLET BY MOUTH EVERY DAY (Patient taking differently: Take 1 mg by mouth daily. ) 90 tablet 3  . famotidine (PEPCID) 20 MG tablet Take 20 mg by mouth 2 (two) times daily as needed for heartburn or indigestion.    . furosemide (LASIX) 20 MG tablet Take 1 tablet (20 mg total) by mouth daily. 90 tablet 3  . lidocaine (LIDODERM) 5 % Place 1 patch onto the skin daily. 30 patch 2  . metoprolol tartrate (LOPRESSOR) 100 MG tablet Take Two hours prior to CT Scan. Do not Take Coreg on the morning of your CT Scan. 1 tablet 0  . polyvinyl alcohol (LIQUIFILM TEARS) 1.4 % ophthalmic solution Place 1 drop into both eyes as needed for dry eyes.    . sacubitril-valsartan (ENTRESTO) 24-26 MG Take 1 tablet by mouth 2 (two) times daily. Due for follow up appt 180 tablet 0  . tolterodine (DETROL LA) 2 MG 24 hr capsule Take 1 capsule (2 mg total) by mouth daily. 90 capsule 1  . carvedilol (COREG) 3.125 MG tablet Take 1 tablet (3.125 mg total) by mouth 2 (two) times daily with a meal. Due for follow up appt 180 tablet 0   No facility-administered medications prior to visit.     Allergies:   Codeine and Spironolactone   Social History   Socioeconomic History  . Marital status: Married    Spouse name: Not on file  . Number of children: 2  . Years of education: Not on file  . Highest education level: Not on file  Occupational History  . Not  on file  Tobacco Use  . Smoking status: Former Smoker    Packs/day: 0.25    Years: 42.00    Pack years: 10.50    Types: Cigarettes    Quit date: 01/24/2017    Years since quitting: 2.4  . Smokeless tobacco: Never Used  Substance and Sexual Activity  . Alcohol use: No  . Drug use: No  . Sexual activity: Never  Other Topics Concern  . 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     Two daughters born 69 and 81- one in [place] one in [place].             Social Determinants of Health  Financial Resource Strain:   . Difficulty of Paying Living Expenses: Not on file  Food Insecurity:   . Worried About Charity fundraiser in the Last Year: Not on file  . Ran Out of Food in the Last Year: Not on file  Transportation Needs:   . Lack of Transportation (Medical): Not on file  . Lack of Transportation (Non-Medical): Not on file  Physical Activity:   . Days of Exercise per Week: Not on file  . Minutes of Exercise per Session: Not on file  Stress:   . Feeling of Stress : Not on file  Social Connections:   . Frequency of Communication with Friends and Family: Not on file  . Frequency of Social Gatherings with Friends and Family: Not on file  . Attends Religious Services: Not on file  . Active Member of Clubs or Organizations: Not on file  . Attends Archivist Meetings: Not on file  . Marital Status: Not on file     Family History:  The patient's family history includes Cancer in her maternal grandmother and paternal aunt; Diabetes in her mother; Heart disease in her father and mother; Hypertension in her father and mother.   Review of Systems:   Please see the history of present illness.     General:  No chills, fever, night sweats or weight changes.  Cardiovascular:  No chest pain, edema, palpitations, paroxysmal nocturnal  dyspnea. Positive for orthopnea and dyspnea on exertion.  Dermatological: No rash, lesions/masses Respiratory: No cough, dyspnea Urologic: No hematuria, dysuria Abdominal:   No nausea, vomiting, diarrhea, bright red blood per rectum, melena, or hematemesis Neurologic:  No visual changes, wkns, changes in mental status. All other systems reviewed and are otherwise negative except as noted above.   Physical Exam:    VS:  BP (!) 142/71   Pulse 75   Temp 97.9 F (36.6 C)   Ht 5\' 4"  (1.626 m)   Wt 92 lb 12.8 oz (42.1 kg)   LMP 02/28/1999   SpO2 97%   BMI 15.93 kg/m    General: Well developed, thin Caucasian female appearing in no acute distress. Head: Normocephalic, atraumatic, sclera non-icteric, no xanthomas, nares are without discharge.  Neck: No carotid bruits. JVD not elevated.  Lungs: Respirations regular and unlabored, without wheezes or rales.  Heart: Regular rate and rhythm. No S3 or S4.  No murmur, no rubs, or gallops appreciated. Abdomen: Soft, non-tender, non-distended with normoactive bowel sounds. No hepatomegaly. No rebound/guarding. No obvious abdominal masses. Msk:  Strength and tone appear normal for age. No joint deformities or effusions. Extremities: No clubbing or cyanosis. No lower extremity edema.  Distal pedal pulses are 2+ bilaterally. Neuro: Alert and oriented X 3. Moves all extremities spontaneously. No focal deficits noted. Psych:  Responds to questions appropriately with a normal affect. Skin: No rashes or lesions noted  Wt Readings from Last 3 Encounters:  07/15/19 92 lb 12.8 oz (42.1 kg)  06/12/19 94 lb 3.2 oz (42.7 kg)  04/08/19 93 lb 2 oz (42.2 kg)     Studies/Labs Reviewed:   EKG:  EKG is not ordered today.    Recent Labs: 12/26/2018: TSH 1.510 02/18/2019: ALT 29; Hemoglobin 13.1; Platelets 130 03/03/2019: Magnesium 1.8 06/18/2019: B Natriuretic Peptide 46.0; BUN 11; Creatinine, Ser <0.30; Potassium 4.8; Sodium 130   Lipid Panel    Component  Value Date/Time   CHOL 200 (H) 12/26/2018 1525   TRIG 83 12/26/2018 1525   HDL 93 12/26/2018 1525  CHOLHDL 2.2 12/26/2018 1525   [place] 90 12/26/2018 1525    Additional studies/ records that were reviewed today include:   Echocardiogram: 05/2019 IMPRESSIONS    1. Left ventricular ejection fraction, by visual estimation, is 30 to 35%. The left ventricle has moderately decreased function. There is no left ventricular hypertrophy.  2. Left ventricular diastolic parameters are indeterminate.  3. The left ventricle demonstrates global hypokinesis.  4. Global right ventricle has low normal systolic function.The right ventricular size is normal. No increase in right ventricular wall thickness.  5. Left atrial size was normal.  6. Right atrial size was normal.  7. The mitral valve is normal in structure. No evidence of mitral valve regurgitation. No evidence of mitral stenosis.  8. The tricuspid valve is normal in structure.  9. The aortic valve is normal in structure. Aortic valve regurgitation is not visualized. No evidence of aortic valve sclerosis or stenosis. 10. The pulmonic valve was not well visualized. Pulmonic valve regurgitation is not visualized. 11. The inferior vena cava is normal in size with greater than 50% respiratory variability, suggesting right atrial pressure of 3 mmHg. 12. The interatrial septum was not well visualized.  Assessment:    1. Chronic systolic congestive heart failure (H[facility])   2. Cardiomyopathy, unspecified type ([place])   3. Hyperlipidemia, unspecified hyperlipidemia type      Plan:   In order of problems listed above:  1. Chronic Systolic CHF - she has a known reduced EF of 25 to 30% by echo in 02/2019, at 30-35% by repeat echo in 06/2019. She continues to have dyspnea on exertion along with orthopnea. Given her cardiomyopathy, she is now in agreement to proceed with a Coronary CT a to rule out an ischemic etiology. This is scheduled for 08/07/2019  and we will follow-up on results once available.  - She is currently on Coreg 3.125 mg twice daily, Lasix 20 mg daily and Entresto 24-26mg  BID. Will increase Coreg to 6.25mg  BID. Previously intolerant to Spironolactone. Pending BP trend with dose adjustment, would consider further titration of Entresto at her next visit.   2. HLD - followed by PCP. She remains on Atorvastatin 80mg  daily.   3. Chest Pain with Atypical Features -She reports having pain along her left pectoral region which is typically along her surgical scar from her mastectomy. She does have a dyspnea on exertion as outlined above.  Plan for Coronary CT for ischemic evaluation as outlined above.  Medication Adjustments/Labs and Tests Ordered: Current medicines are reviewed at length with the patient today.  Concerns regarding medicines are outlined above.  Medication changes, Labs and Tests ordered today are listed in the Patient Instructions below. Patient Instructions  Medication Instructions:  Your physician has recommended you make the following change in your medication:  Increase Coreg to 6.25 Two Times Daily   *If you need a refill on your cardiac medications before your next appointment, please call your pharmacy*  Lab Work: NONE   If you have labs (blood work) drawn today and your tests are completely normal, you will receive your results only by: Marland Kitchen MyChart Message (if you have MyChart) OR . A paper copy in the mail If you have any lab test that is abnormal or we need to change your treatment, we will call you to review the results.  Testing/Procedures: NONE   Follow-Up: At [facility], you and your health needs are our priority.  As part of our continuing mission to provide you with exceptional heart  care, we have created designated Provider Care Teams.  These Care Teams include your primary Cardiologist (physician) and Advanced Practice Providers (APPs -  Physician Assistants and Nurse Practitioners) who  all work together to provide you with the care you need, when you need it.  Your next appointment:   6 week(s)  The format for your next appointment:   In Person  Provider:   You may see Dr. Domenic Polite or one of the following Advanced Practice Providers on your designated Care Team:    Bernerd Pho, PA-C   Ermalinda Barrios, PA-C    Other Instructions Thank you for choosing [facility]!    Signed, Erma Heritage, PA-C  07/15/2019 5:06 PM    [facility] [address] [place], [place] [address] Phone: [phone] Fax: [phone]

## 2019-07-19 ENCOUNTER — Other Ambulatory Visit: Payer: Self-pay | Admitting: Family

## 2019-07-19 DIAGNOSIS — N393 Stress incontinence (female) (male): Secondary | ICD-10-CM

## 2019-07-21 ENCOUNTER — Other Ambulatory Visit: Payer: Self-pay

## 2019-07-21 MED ORDER — SACUBITRIL-VALSARTAN 24-26 MG PO TABS: 1.0000 | ORAL_TABLET | Freq: Two times a day (BID) | ORAL | 3 refills | Status: DC

## 2019-07-21 MED ORDER — ATORVASTATIN CALCIUM 80 MG PO TABS: 80.0000 mg | ORAL_TABLET | Freq: Every day | ORAL | 1 refills | Status: DC

## 2019-07-21 NOTE — Telephone Encounter (Signed)
Ov 12/26/19 rtc 1 yr

## 2019-07-21 NOTE — Telephone Encounter (Signed)
refilled entresto and atorvastatin

## 2019-07-28 ENCOUNTER — Telehealth: Payer: Self-pay | Admitting: Student

## 2019-07-28 MED ORDER — ATORVASTATIN CALCIUM 40 MG PO TABS: 40.0000 mg | ORAL_TABLET | Freq: Every day | ORAL | 3 refills | Status: DC

## 2019-07-28 NOTE — Telephone Encounter (Signed)
Patient states she has muscle pain and the pill is to large to swallow

## 2019-07-28 NOTE — Telephone Encounter (Signed)
Patient will cut atorvastatin in half and will take 40 mg daily.She will call back if muscle pain continues

## 2019-07-28 NOTE — Telephone Encounter (Signed)
Patient called to say that she is having problems with taking  atorvastatin (LIPITOR) 80 MG tablet

## 2019-07-28 NOTE — Telephone Encounter (Signed)
    Can reduce dosing to 40mg  daily. She can cut her current tablets in half which should help with size but reduced dosing should also help with myalgias.   Signed, Erma Heritage, PA-C 07/28/2019, 1:04 PM Pager: [phone]

## 2019-08-05 ENCOUNTER — Encounter (HOSPITAL_COMMUNITY): Payer: Self-pay

## 2019-08-07 ENCOUNTER — Ambulatory Visit (HOSPITAL_COMMUNITY): Payer: Medicare Other

## 2019-08-14 ENCOUNTER — Other Ambulatory Visit: Payer: Self-pay | Admitting: Family

## 2019-08-27 ENCOUNTER — Telehealth: Payer: Self-pay | Admitting: Student

## 2019-08-27 NOTE — Telephone Encounter (Signed)
Patient reports cough with clear phlegm since Monday, no fever. States her weight is 94 lbs, appetite good.No swelling noted in ankles but says left ankle is achy. Uses 2 pillows to sleep at night which is normal for her.Takes lasix 20 mg daily as directed. She reports yesterday she could not physically get up from toilet.States she felt very weak.She had to rock back and forth on the toilet until she had enough momentum to get up.

## 2019-08-27 NOTE — Telephone Encounter (Signed)
Patient took BP 79/60, repeated and got 92/66, HR 96   Per [place], PA-C, patient will decrease Coreg to 3.125 mg twice a day, record BP's and call us back at the end of the week (2 days)   She will also increase lasix for 2 days and let us know what her weight is.

## 2019-08-27 NOTE — Telephone Encounter (Signed)
Pt called requesting to speak with the nurse, states she's having shortness of breath and weakness.  . [phone]

## 2019-08-27 NOTE — Telephone Encounter (Addendum)
     Her weight is up a few pounds since the time of her last visit and given her reported dyspnea, would recommend she take an extra Lasix 20 mg for the next 2 to 3 days to see if this helps with symptoms.  It appears she is scheduled for her Coronary CT next week which will help guide further cardiac testing if needed. If she continues to have worsening weakness, would recommend follow-up with her PCP in regards to further evaluation. If she continues to have episodes of weakness, would also recommend checking her blood sugar if able to make sure this is stable if weakness persists. It would also be beneficial to check BP to make sure this is not too low given her medications for her cardiomyopathy.   Signed, Erma Heritage, PA-C 08/27/2019, 4:51 PM Pager: [phone]

## 2019-08-29 ENCOUNTER — Ambulatory Visit: Payer: Medicare Other | Admitting: Cardiology

## 2019-09-04 ENCOUNTER — Encounter (HOSPITAL_COMMUNITY): Payer: Self-pay

## 2019-09-04 ENCOUNTER — Telehealth (HOSPITAL_COMMUNITY): Payer: Self-pay | Admitting: Emergency Medicine

## 2019-09-04 NOTE — Telephone Encounter (Signed)
Reaching out to patient to offer assistance regarding upcoming cardiac imaging study; pt verbalizes understanding of appt date/time, parking situation and where to check in, pre-test NPO status and medications ordered, and verified current allergies; name and call back number provided for further questions should they arise Roderica Cathell RN Navigator Cardiac Imaging [facility] [phone] office [phone] cell 

## 2019-09-05 ENCOUNTER — Ambulatory Visit (HOSPITAL_COMMUNITY)
Admission: RE | Admit: 2019-09-05 | Discharge: 2019-09-05 | Disposition: A | Discharge status: Home / Self Care | Payer: 59 | Source: Ambulatory Visit | Attending: Student | Admitting: Student

## 2019-09-05 ENCOUNTER — Other Ambulatory Visit: Payer: Self-pay

## 2019-09-05 DIAGNOSIS — I700 Atherosclerosis of aorta: Secondary | ICD-10-CM | POA: Diagnosis not present

## 2019-09-05 DIAGNOSIS — I25118 Atherosclerotic heart disease of native coronary artery with other forms of angina pectoris: Secondary | ICD-10-CM | POA: Insufficient documentation

## 2019-09-05 DIAGNOSIS — I208 Other forms of angina pectoris: Secondary | ICD-10-CM

## 2019-09-05 LAB — POCT I-STAT CREATININE: Creatinine, Ser: 0.4 mg/dL — ABNORMAL LOW (ref 0.44–1.00)

## 2019-09-05 MED ORDER — IOHEXOL 350 MG/ML SOLN
70.0000 mL | Freq: Once | INTRAVENOUS | Status: AC | PRN
  Administered 2019-09-05: 70 mL via INTRAVENOUS

## 2019-09-05 MED ORDER — NITROGLYCERIN 0.4 MG SL SUBL
SUBLINGUAL_TABLET | SUBLINGUAL | Status: AC
  Filled 2019-09-05: qty 2

## 2019-09-05 MED ORDER — NITROGLYCERIN 0.4 MG SL SUBL
0.8000 mg | SUBLINGUAL_TABLET | Freq: Once | SUBLINGUAL | Status: AC
  Administered 2019-09-05: 0.8 mg via SUBLINGUAL

## 2019-09-08 DIAGNOSIS — I208 Other forms of angina pectoris: Secondary | ICD-10-CM | POA: Diagnosis not present

## 2019-09-12 ENCOUNTER — Other Ambulatory Visit (HOSPITAL_COMMUNITY)
Admission: RE | Admit: 2019-09-12 | Discharge: 2019-09-12 | Disposition: A | Discharge status: Home / Self Care | Payer: 59 | Source: Ambulatory Visit | Attending: Cardiology | Admitting: Cardiology

## 2019-09-12 ENCOUNTER — Encounter: Payer: Self-pay | Admitting: Cardiology

## 2019-09-12 ENCOUNTER — Other Ambulatory Visit: Payer: Self-pay

## 2019-09-12 ENCOUNTER — Ambulatory Visit (INDEPENDENT_AMBULATORY_CARE_PROVIDER_SITE_OTHER): Payer: 59 | Admitting: Cardiology

## 2019-09-12 VITALS — BP 115/73 | HR 91 | Temp 97.0°F | Ht 64.0 in | Wt 95.8 lb

## 2019-09-12 DIAGNOSIS — I429 Cardiomyopathy, unspecified: Secondary | ICD-10-CM

## 2019-09-12 DIAGNOSIS — E785 Hyperlipidemia, unspecified: Secondary | ICD-10-CM | POA: Diagnosis present

## 2019-09-12 DIAGNOSIS — Z79899 Other long term (current) drug therapy: Secondary | ICD-10-CM

## 2019-09-12 DIAGNOSIS — I2510 Atherosclerotic heart disease of native coronary artery without angina pectoris: Secondary | ICD-10-CM | POA: Diagnosis present

## 2019-09-12 LAB — HEPATIC FUNCTION PANEL
ALT: 41 U/L (ref 0–44)
AST: 46 U/L — ABNORMAL HIGH (ref 15–41)
Albumin: 3.9 g/dL (ref 3.5–5.0)
Alkaline Phosphatase: 46 U/L (ref 38–126)
Bilirubin, Direct: <0.1 mg/dL (ref 0.0–0.2)
Total Bilirubin: 0.6 mg/dL (ref 0.3–1.2)
Total Protein: 6.8 g/dL (ref 6.5–8.1)

## 2019-09-12 LAB — LIPID PANEL
Cholesterol: 138 mg/dL (ref 0–200)
HDL: 68 mg/dL (ref >40)
LDL Cholesterol: 58 mg/dL (ref 0–99)
Total CHOL/HDL Ratio: 2 RATIO
Triglycerides: 58 mg/dL (ref <150)
VLDL: 12 mg/dL (ref 0–40)

## 2019-09-12 MED ORDER — ASPIRIN EC 81 MG PO TBEC: 81.0000 mg | DELAYED_RELEASE_TABLET | Freq: Every day | ORAL | 3 refills | Status: DC

## 2019-09-12 MED ORDER — DIGOXIN 125 MCG PO TABS: 0.1250 mg | ORAL_TABLET | Freq: Every day | ORAL | 1 refills | Status: DC

## 2019-09-12 MED ORDER — FUROSEMIDE 20 MG PO TABS: 20.0000 mg | ORAL_TABLET | ORAL | 3 refills | Status: DC

## 2019-09-12 NOTE — Patient Instructions (Addendum)
Medication Instructions:   Your physician has recommended you make the following change in your medication:   Start aspirin 81 mg by mouth daily  Start digoxin 0.125 mg by mouth daily  Decrease furosemide to 20 mg by mouth every other day  Continue other medications the same  Labwork:  Your physician recommends that you return for a FASTING lipid/liver profile: Today.  Testing/Procedures:  NONE  Follow-Up:  Your physician recommends that you schedule a follow-up appointment in: 3-4 weeks (office).  Any Other Special Instructions Will Be Listed Below (If Applicable).  If you need a refill on your cardiac medications before your next appointment, please call your pharmacy.

## 2019-09-12 NOTE — Progress Notes (Signed)
Cardiology Office Note  Date: 09/12/2019   ID: Sherry Bender, DOB 14-Jan-1951, MRN [id number]  PCP:  Sharion Balloon, FNP  Evaluating Cardiologist:  Rozann Lesches, MD Electrophysiologist:  None   Chief Complaint  Patient presents with  . Cardiac follow-up    History of Present Illness: Sherry Bender is a 69 y.o. female presenting for an office follow-up visit, last seen in January by Ms. Strader PA-C.  This is our first meeting today.  I reviewed extensive records and updated the chart.  She presents today stating that she has felt weak overall.  I see telephone communications regarding low blood pressures as well as shortness of breath.  In going through her medications she tells me that she has been taking Coreg 3.125 mg twice daily, Lasix 40 mg daily which is higher than the previous standing dose, and Entresto.  She is on Lipitor 40 mg daily, but states that she is having trouble with the large pill.  She did not start on aspirin.  She recently underwent a cardiac CTA that demonstrated moderate coronary atherosclerosis with calcium score of 905.  Subsequent FFR imaging did not however demonstrate any flow-limiting stenoses with recommendation for medical therapy.  I reviewed the results with her personally, she requested a copy for herself.  She was previously intolerant of Aldactone per chart review.  She has not had a follow-up lipid panel since last July.  Past Medical History:  Diagnosis Date  . Anxiety   . Arthritis   . Breast cancer ([place]) 2016  . Cardiomyopathy ([place])    a. EF 25-30% by echocardiogram in 02/2019  . Coronary atherosclerosis    Cardiac CTA with negative FFR, nonobstructive March 2021  . Cricopharyngeus muscle dysfunction   . Gastric ulcer   . GERD (gastroesophageal reflux disease)   . Hiatal hernia   . Hyperlipidemia   . Hypertension   . Scoliosis   . Skin cancer   . Urinary frequency     Past Surgical History:  Procedure Laterality Date  .  ABDOMINAL HYSTERECTOMY  2000   TAH/BSO  . ANTERIOR CERVICAL DECOMP/DISCECTOMY FUSION N/A 06/18/2014   Procedure: C4-5 C5-6 C6-7 ANTERIOR CERVICAL DECOMPRESSION/DISKECTOMY/FUSION;  Surgeon: Eustace Moore, MD;  Location: [facility];  Service: Neurosurgery;  Laterality: N/A;  C4-5 C5-6 C6-7 ANTERIOR CERVICAL DECOMPRESSION/DISKECTOMY/FUSION  . ANTERIOR CERVICAL DECOMP/DISCECTOMY FUSION N/A 06/25/2014   Procedure: EXPLORATION OF CERVICAL FUSION WITH REVIOSN OF HARDWARE;  Surgeon: Eustace Moore, MD;  Location: [facility];  Service: Neurosurgery;  Laterality: N/A;  . CERVICAL FUSION    . CHOLECYSTECTOMY  1994  . COMPLETE MASTECTOMY W/ SENTINEL NODE BIOPSY Left 04/30/2015  . FOOT NEUROMA SURGERY  01/2010   also had metatarasl shortened  . KNEE ARTHROSCOPY  2005   right knee  . MASTECTOMY Left 2016  . PORT-A-CATH REMOVAL Right 04/30/2015   Procedure: REMOVAL PORT-A-CATH;  Surgeon: Fanny Skates, MD;  Location: [facility];  Service: General;  Laterality: Right;  . PORTACATH PLACEMENT N/A 11/23/2014   Procedure: INSERTION PORT-A-CATH WITH ULTRASOUND;  Surgeon: Fanny Skates, MD;  Location: [facility];  Service: General;  Laterality: N/A;  . RHINOPLASTY    . SIMPLE MASTECTOMY WITH AXILLARY SENTINEL NODE BIOPSY Left 04/30/2015   Procedure: LEFT TOTAL  MASTECTOMY WITH LEFT AXILLARY SENTINEL NODE BIOPSY;  Surgeon: Fanny Skates, MD;  Location: [facility];  Service: General;  Laterality: Left;    Current Outpatient Medications  Medication Sig Dispense Refill  . atorvastatin (LIPITOR)  40 MG tablet Take 1 tablet (40 mg total) by mouth daily at 6 PM. Due for follow up appt 90 tablet 3  . carvedilol (COREG) 3.125 MG tablet Take 1 tablet by mouth in the morning and at bedtime.    Marland Kitchen estradiol (ESTRACE) 1 MG tablet TAKE 1 TABLET BY MOUTH EVERY DAY 90 tablet 1  . famotidine (PEPCID) 20 MG tablet Take 20 mg by mouth 2 (two) times daily as needed for heartburn or indigestion.    . furosemide (LASIX) 20 MG  tablet Take 1 tablet (20 mg total) by mouth every other day. 45 tablet 3  . lidocaine (LIDODERM) 5 % Place 1 patch onto the skin daily. 30 patch 2  . polyvinyl alcohol (LIQUIFILM TEARS) 1.4 % ophthalmic solution Place 1 drop into both eyes as needed for dry eyes.    . sacubitril-valsartan (ENTRESTO) 24-26 MG Take 1 tablet by mouth 2 (two) times daily. Due for follow up appt 180 tablet 3  . tolterodine (DETROL LA) 2 MG 24 hr capsule TAKE 1 CAPSULE BY MOUTH DAILY. 90 capsule 1  . aspirin EC 81 MG tablet Take 1 tablet (81 mg total) by mouth daily. 90 tablet 3  . digoxin (LANOXIN) 0.125 MG tablet Take 1 tablet (0.125 mg total) by mouth daily. 90 tablet 1   No current facility-administered medications for this visit.   Allergies:  Codeine and Spironolactone   ROS:   No palpitations or syncope.  Physical Exam: VS:  BP 115/73   Pulse 91   Temp (!) 97 F (36.1 C)   Ht 5\' 4"  (1.626 m)   Wt 95 lb 12.8 oz (43.5 kg)   LMP 02/28/1999   SpO2 99%   BMI 16.44 kg/m , BMI Body mass index is 16.44 kg/m.  Wt Readings from Last 3 Encounters:  09/12/19 95 lb 12.8 oz (43.5 kg)  07/15/19 92 lb 12.8 oz (42.1 kg)  06/12/19 94 lb 3.2 oz (42.7 kg)    General: Frail-appearing woman, no distress. HEENT: Conjunctiva and lids normal, wearing a mask. Neck: Supple, no elevated JVP or carotid bruits, no thyromegaly. Lungs: Clear to auscultation, nonlabored breathing at rest. Cardiac: Regular rate and rhythm, no S3 or significant systolic murmur. Abdomen: Soft, bowel sounds present. Extremities: No pitting edema, distal pulses 2+.  ECG:  An ECG dated 02/18/2019 was personally reviewed today and demonstrated:  Sinus rhythm with leftward axis, decreased R wave progression.  Recent Labwork: 12/26/2018: TSH 1.510 02/18/2019: ALT 29; AST 34; Hemoglobin 13.1; Platelets 130 03/03/2019: Magnesium 1.8 06/18/2019: B Natriuretic Peptide 46.0; BUN 11; Potassium 4.8; Sodium 130 09/05/2019: Creatinine, Ser 0.40     Component  Value Date/Time   CHOL 200 (H) 12/26/2018 1525   TRIG 83 12/26/2018 1525   HDL 93 12/26/2018 1525   CHOLHDL 2.2 12/26/2018 1525   LDLCALC 90 12/26/2018 1525    Other Studies Reviewed Today:  Echocardiogram 06/18/2019: 1. Left ventricular ejection fraction, by visual estimation, is 30 to  35%. The left ventricle has moderately decreased function. There is no  left ventricular hypertrophy.  2. Left ventricular diastolic parameters are indeterminate.  3. The left ventricle demonstrates global hypokinesis.  4. Global right ventricle has low normal systolic function.The right  ventricular size is normal. No increase in right ventricular wall  thickness.  5. Left atrial size was normal.  6. Right atrial size was normal.  7. The mitral valve is normal in structure. No evidence of mitral valve  regurgitation. No evidence of mitral  stenosis.  8. The tricuspid valve is normal in structure.  9. The aortic valve is normal in structure. Aortic valve regurgitation is  not visualized. No evidence of aortic valve sclerosis or stenosis.  10. The pulmonic valve was not well visualized. Pulmonic valve  regurgitation is not visualized.  11. The inferior vena cava is normal in size with greater than 50%  respiratory variability, suggesting right atrial pressure of 3 mmHg.  12. The interatrial septum was not well visualized.   Cardiac CTA 09/05/2019: FINDINGS: Coronary calcium score: The patient's coronary artery calcium score is 905, which places the patient in the 97 percentile.  Coronary arteries: Normal coronary origins.  Right dominance.  Right Coronary Artery: Mild mixed atherosclerotic plaque in the ostial and proximal RCA, 25-49% stenosis. Minimal mixed atherosclerotic plaque in the proximal RCA, mid RCA, and distal RCA with positive remodeling, 25-49% stenosis.  Left Main Coronary Artery: Minimal ostial atherosclerosis plaque in the left main, <25% stenosis. Minimal distal  left main mixed atherosclerotic plaque, <25% stenosis.  Left Anterior Descending Coronary Artery: Moderate mixed atherosclerotic plaque in the ostial to proximal LAD, with positive remodeling, 50-69% stenosis. Mild mixed atherosclerotic plaque in the mid LAD and mid-distal LAD, 25-49% stenosis.  Left Circumflex Artery: Mild ostial and proximal circumflex mixed atherosclerotic plaque, 25-49% stenosis. Moderate mid circumflex mixed atherosclerotic plaque, 50-69% stenosis.  Aorta: Normal size, 28 mm at the mid ascending aorta (level of the PA bifurcation) measured double oblique. No calcifications. No dissection.  Aortic Valve: Mild annular calcifications.  Trileaflet.  Other findings:  Normal pulmonary vein drainage into the left atrium.  Normal left atrial appendage without a thrombus.  Normal size of the pulmonary artery.  IMPRESSION: 1. Moderate CAD in proximal LAD with positive remodeling, and mid circumflex, CADRADS = 3. CT FFR will be performed and reported separately.  2. Coronary artery calcium score is 905, which places the patient in the 97 percentile.  3. Normal coronary origin with right dominance.  Cardiac CTA FFR 09/05/2019: 1. Left Main:  No significant stenosis. FFR = 0.92  2. LAD: No significant stenosis. Proximal FFR = 0.91, Mid FFR = 0.89, Distal FFR = 0.84 3. LCX: No significant stenosis. Proximal FFR = 0.95, Distal FFR = 0.85, OM = 0.91 4. RCA: No significant stenosis. Proximal FFR = 0.91, Mid FFR = 0.89, Distal FFR = 0.85  IMPRESSION: 1.  CT FFR analysis didn't show any significant stenosis.  Assessment and Plan:  1.  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  LVEF 30 to 35% by echocardiogram in December 2020.  We discussed her recent symptoms and I reviewed the chart regarding her lower blood pressures.  Plan is to continue Coreg at 3.125 mg twice daily, cut Lasix  to 20 mg every other day, and continue Entresto 24/26 mg twice daily.  Add Lanoxin 0.125 mg daily.  She has prior intolerance to Aldactone.  If heart rates remain suboptimally controlled, Corlanor could be considered.  2.  Moderate, nonobstructive coronary artery disease by cardiac CTA with FFR imaging.  Discussed initiation of aspirin 81 mg daily, continue Lipitor.  We will obtain a follow-up lipid panel with LFTs to ensure that she is at goal.  Medication Adjustments/Labs and Tests Ordered: Current medicines are reviewed at length with the patient today.  Concerns regarding medicines are outlined above.   Tests Ordered: Orders Placed This Encounter  Procedures  . Lipid panel  . Hepatic function panel  Medication Changes: Meds ordered this encounter  Medications  . furosemide (LASIX) 20 MG tablet    Sig: Take 1 tablet (20 mg total) by mouth every other day.    Dispense:  45 tablet    Refill:  3    09/12/2019 dose decrease  . aspirin EC 81 MG tablet    Sig: Take 1 tablet (81 mg total) by mouth daily.    Dispense:  90 tablet    Refill:  3    09/12/2019 NEW  . digoxin (LANOXIN) 0.125 MG tablet    Sig: Take 1 tablet (0.125 mg total) by mouth daily.    Dispense:  90 tablet    Refill:  1    09/12/2019 NEW    Disposition:  Follow up 3 to 4 weeks with [place] in the [place] office.  Signed, Satira Sark, MD, [facility] 09/12/2019 3:12 PM    [place] [facility] [address], [place], [place] [address] Phone: [phone]; Fax: [phone]

## 2019-09-17 ENCOUNTER — Telehealth: Payer: Self-pay

## 2019-09-17 MED ORDER — ATORVASTATIN CALCIUM 20 MG PO TABS: 20.0000 mg | ORAL_TABLET | Freq: Every day | ORAL | 3 refills | Status: DC

## 2019-09-17 NOTE — Telephone Encounter (Signed)
Lab results given to patient.She will reduce lipitor to 20 mg daily at dinner.

## 2019-09-17 NOTE — Telephone Encounter (Signed)
-----   Message from Satira Sark, MD sent at 09/12/2019  4:34 PM EDT ----- Results reviewed.  LDL is 58 at this point.  If she is having difficulty with the Lipitor 40 mg tablet, we can reduce this to 20 mg daily for now.

## 2019-10-20 ENCOUNTER — Ambulatory Visit (INDEPENDENT_AMBULATORY_CARE_PROVIDER_SITE_OTHER): Payer: 59 | Admitting: Cardiology

## 2019-10-20 ENCOUNTER — Encounter: Payer: Self-pay | Admitting: Cardiology

## 2019-10-20 ENCOUNTER — Other Ambulatory Visit: Payer: Self-pay

## 2019-10-20 VITALS — BP 110/62 | HR 90 | Temp 97.5°F | Ht 64.0 in | Wt 97.2 lb

## 2019-10-20 DIAGNOSIS — I2510 Atherosclerotic heart disease of native coronary artery without angina pectoris: Secondary | ICD-10-CM

## 2019-10-20 DIAGNOSIS — I428 Other cardiomyopathies: Secondary | ICD-10-CM | POA: Diagnosis not present

## 2019-10-20 DIAGNOSIS — R531 Weakness: Secondary | ICD-10-CM | POA: Diagnosis not present

## 2019-10-20 NOTE — Patient Instructions (Signed)
Medication Instructions:  Your physician recommends that you continue on your current medications as directed. Please refer to the Current Medication list given to you today.  *If you need a refill on your cardiac medications before your next appointment, please call your pharmacy*   Lab Work: NONE   If you have labs (blood work) drawn today and your tests are completely normal, you will receive your results only by: Marland Kitchen MyChart Message (if you have MyChart) OR . A paper copy in the mail If you have any lab test that is abnormal or we need to change your treatment, we will call you to review the results.   Testing/Procedures: Your physician has requested that you have an echocardiogram. Echocardiography is a painless test that uses sound waves to create images of your heart. It provides your doctor with information about the size and shape of your heart and how well your heart's chambers and valves are working. This procedure takes approximately one hour. There are no restrictions for this procedure.     Follow-Up: At [facility], you and your health needs are our priority.  As part of our continuing mission to provide you with exceptional heart care, we have created designated Provider Care Teams.  These Care Teams include your primary Cardiologist (physician) and Advanced Practice Providers (APPs -  Physician Assistants and Nurse Practitioners) who all work together to provide you with the care you need, when you need it.  We recommend signing up for the patient portal called "MyChart".  Sign up information is provided on this After Visit Summary.  MyChart is used to connect with patients for Virtual Visits (Telemedicine).  Patients are able to view lab/test results, encounter notes, upcoming appointments, etc.  Non-urgent messages can be sent to your provider as well.   To learn more about what you can do with MyChart, go to [email or web address].    Your next appointment:   4  month(s)  The format for your next appointment:   In Person  Provider:   Bernerd Pho, PA-C   Other Instructions Thank you for choosing [facility]!

## 2019-10-20 NOTE — Progress Notes (Signed)
Cardiology Office Note  Date: 10/20/2019   ID: Jakki Doughty, DOB [birth year], MRN [id number]  PCP:  Sharion Balloon, FNP  Cardiologist:  Rozann Lesches, MD Electrophysiologist:  None   Chief Complaint  Patient presents with  . Cardiac follow-up    History of Present Illness: Sherry Bender is a 69 y.o. female that I met in March.  She presents today with her husband for a follow-up visit.  Her main concern today is of chronic weakness, difficulty getting up off her commode at times, weakness in her knees.  She states that this has been going on for months, has not addressed it with her PCP as yet.  She remains underweight, 97 pounds today.  I did talk with her about her eating habits and diet, it was not entirely clear how much she is eating at home.  She has fairly low muscle mass.  Her TSH was normal in July of last year.  She does have a nonischemic cardiomyopathy, but her current symptoms seem to be out of proportion.  At the last visit Lasix was decreased to 20 mg every other day, lanoxin was added. She has a previous intolerance to aldactone.  She states that she has been taking her current medications as listed below without difficulties.  Her blood pressure is normal today.  Lab work from March is outlined below.  Past Medical History:  Diagnosis Date  . Anxiety   . Arthritis   . Breast cancer ([place]) 2016  . Cardiomyopathy ([place])    a. EF 25-30% by echocardiogram in 02/2019  . Coronary atherosclerosis    Cardiac CTA with negative FFR, nonobstructive March 2021  . Cricopharyngeus muscle dysfunction   . Gastric ulcer   . GERD (gastroesophageal reflux disease)   . Hiatal hernia   . Hyperlipidemia   . Hypertension   . Scoliosis   . Skin cancer   . Urinary frequency     Past Surgical History:  Procedure Laterality Date  . ABDOMINAL HYSTERECTOMY  2000   TAH/BSO  . ANTERIOR CERVICAL DECOMP/DISCECTOMY FUSION N/A 06/18/2014   Procedure: C4-5 C5-6 C6-7 ANTERIOR CERVICAL  DECOMPRESSION/DISKECTOMY/FUSION;  Surgeon: Eustace Moore, MD;  Location: [facility];  Service: Neurosurgery;  Laterality: N/A;  C4-5 C5-6 C6-7 ANTERIOR CERVICAL DECOMPRESSION/DISKECTOMY/FUSION  . ANTERIOR CERVICAL DECOMP/DISCECTOMY FUSION N/A 06/25/2014   Procedure: EXPLORATION OF CERVICAL FUSION WITH REVIOSN OF HARDWARE;  Surgeon: Eustace Moore, MD;  Location: [facility];  Service: Neurosurgery;  Laterality: N/A;  . CERVICAL FUSION    . CHOLECYSTECTOMY  1994  . COMPLETE MASTECTOMY W/ SENTINEL NODE BIOPSY Left 04/30/2015  . FOOT NEUROMA SURGERY  01/2010   also had metatarasl shortened  . KNEE ARTHROSCOPY  2005   right knee  . MASTECTOMY Left 2016  . PORT-A-CATH REMOVAL Right 04/30/2015   Procedure: REMOVAL PORT-A-CATH;  Surgeon: Fanny Skates, MD;  Location: [facility];  Service: General;  Laterality: Right;  . PORTACATH PLACEMENT N/A 11/23/2014   Procedure: INSERTION PORT-A-CATH WITH ULTRASOUND;  Surgeon: Fanny Skates, MD;  Location: [facility];  Service: General;  Laterality: N/A;  . RHINOPLASTY    . SIMPLE MASTECTOMY WITH AXILLARY SENTINEL NODE BIOPSY Left 04/30/2015   Procedure: LEFT TOTAL  MASTECTOMY WITH LEFT AXILLARY SENTINEL NODE BIOPSY;  Surgeon: Fanny Skates, MD;  Location: [facility];  Service: General;  Laterality: Left;    Current Outpatient Medications  Medication Sig Dispense Refill  . aspirin EC 81 MG tablet Take 1 tablet (81 mg  total) by mouth daily. 90 tablet 3  . atorvastatin (LIPITOR) 20 MG tablet Take 1 tablet (20 mg total) by mouth daily. 90 tablet 3  . carvedilol (COREG) 3.125 MG tablet Take 1 tablet by mouth in the morning and at bedtime.    . digoxin (LANOXIN) 0.125 MG tablet Take 1 tablet (0.125 mg total) by mouth daily. 90 tablet 1  . estradiol (ESTRACE) 1 MG tablet TAKE 1 TABLET BY MOUTH EVERY DAY 90 tablet 1  . famotidine (PEPCID) 20 MG tablet Take 20 mg by mouth 2 (two) times daily as needed for heartburn or indigestion.    . furosemide (LASIX) 20 MG  tablet Take 1 tablet (20 mg total) by mouth every other day. 45 tablet 3  . lidocaine (LIDODERM) 5 % Place 1 patch onto the skin daily. 30 patch 2  . polyvinyl alcohol (LIQUIFILM TEARS) 1.4 % ophthalmic solution Place 1 drop into both eyes as needed for dry eyes.    . sacubitril-valsartan (ENTRESTO) 24-26 MG Take 1 tablet by mouth 2 (two) times daily. Due for follow up appt 180 tablet 3  . tolterodine (DETROL LA) 2 MG 24 hr capsule TAKE 1 CAPSULE BY MOUTH DAILY. 90 capsule 1   No current facility-administered medications for this visit.   Allergies:  Codeine and Spironolactone   ROS:   Weakness, reported scoliosis and back pain.  Physical Exam: VS:  BP 110/62   Pulse 90   Temp (!) 97.5 F (36.4 C)   Ht 5' 4"  (1.626 m)   Wt 97 lb 3.2 oz (44.1 kg)   LMP 02/28/1999   SpO2 100%   BMI 16.68 kg/m , BMI Body mass index is 16.68 kg/m.  Wt Readings from Last 3 Encounters:  10/20/19 97 lb 3.2 oz (44.1 kg)  09/12/19 95 lb 12.8 oz (43.5 kg)  07/15/19 92 lb 12.8 oz (42.1 kg)    General: Underweight appearing woman in no distress. HEENT: Conjunctiva and lids normal, wearing a mask. Neck: Supple, no elevated JVP or carotid bruits, no thyromegaly. Lungs: Clear to auscultation, nonlabored breathing at rest. Cardiac: Regular rate and rhythm, no S3 or significant systolic murmur. Abdomen: Soft, bowel sounds present, no guarding or rebound. Extremities: No pitting edema, distal pulses 2+.  Decreased muscle mass.  ECG:  An ECG dated 02/18/2019 was personally reviewed today and demonstrated:  Sinus rhythm with leftward axis, decreased R wave progression.  Recent Labwork: 12/26/2018: TSH 1.510 02/18/2019: Hemoglobin 13.1; Platelets 130 03/03/2019: Magnesium 1.8 06/18/2019: B Natriuretic Peptide 46.0; BUN 11; Potassium 4.8; Sodium 130 09/05/2019: Creatinine, Ser 0.40 09/12/2019: ALT 41; AST 46     Component Value Date/Time   CHOL 138 09/12/2019 1518   CHOL 200 (H) 12/26/2018 1525   TRIG 58  09/12/2019 1518   HDL 68 09/12/2019 1518   HDL 93 12/26/2018 1525   CHOLHDL 2.0 09/12/2019 1518   VLDL 12 09/12/2019 1518   LDLCALC 58 09/12/2019 1518   LDLCALC 90 12/26/2018 1525    Other Studies Reviewed Today:  Echocardiogram 06/18/2019: 1. Left ventricular ejection fraction, by visual estimation, is 30 to  35%. The left ventricle has moderately decreased function. There is no  left ventricular hypertrophy.  2. Left ventricular diastolic parameters are indeterminate.  3. The left ventricle demonstrates global hypokinesis.  4. Global right ventricle has low normal systolic function.The right  ventricular size is normal. No increase in right ventricular wall  thickness.  5. Left atrial size was normal.  6. Right atrial size was  normal.  7. The mitral valve is normal in structure. No evidence of mitral valve  regurgitation. No evidence of mitral stenosis.  8. The tricuspid valve is normal in structure.  9. The aortic valve is normal in structure. Aortic valve regurgitation is  not visualized. No evidence of aortic valve sclerosis or stenosis.  10. The pulmonic valve was not well visualized. Pulmonic valve  regurgitation is not visualized.  11. The inferior vena cava is normal in size with greater than 50%  respiratory variability, suggesting right atrial pressure of 3 mmHg.  12. The interatrial septum was not well visualized.   Cardiac CTA 09/05/2019: FINDINGS: Coronary calcium score: The patient's coronary artery calcium score is 905, which places the patient in the 97 percentile.  Coronary arteries: Normal coronary origins. Right dominance.  Right Coronary Artery: Mild mixed atherosclerotic plaque in the ostial and proximal RCA, 25-49% stenosis. Minimal mixed atherosclerotic plaque in the proximal RCA, mid RCA, and distal RCA with positive remodeling, 25-49% stenosis.  Left Main Coronary Artery: Minimal ostial atherosclerosis plaque in the left main, <25%  stenosis. Minimal distal left main mixed atherosclerotic plaque, <25% stenosis.  Left Anterior Descending Coronary Artery: Moderate mixed atherosclerotic plaque in the ostial to proximal LAD, with positive remodeling, 50-69% stenosis. Mild mixed atherosclerotic plaque in the mid LAD and mid-distal LAD, 25-49% stenosis.  Left Circumflex Artery: Mild ostial and proximal circumflex mixed atherosclerotic plaque, 25-49% stenosis. Moderate mid circumflex mixed atherosclerotic plaque, 50-69% stenosis.  Aorta: Normal size, 28 mm at the mid ascending aorta (level of the PA bifurcation) measured double oblique. No calcifications. No dissection.  Aortic Valve: Mild annular calcifications. Trileaflet.  Other findings:  Normal pulmonary vein drainage into the left atrium.  Normal left atrial appendage without a thrombus.  Normal size of the pulmonary artery.  IMPRESSION: 1. Moderate CAD in proximal LAD with positive remodeling, and mid circumflex, CADRADS = 3. CT FFR will be performed and reported separately.  2. Coronary artery calcium score is 905, which places the patient in the 97 percentile.  3. Normal coronary origin with right dominance.  Cardiac CTA FFR 09/05/2019: 1. Left Main: No significant stenosis. FFR = 0.92  2. LAD: No significant stenosis. Proximal FFR = 0.91, Mid FFR = 0.89, Distal FFR = 0.84 3. LCX: No significant stenosis. Proximal FFR = 0.95, Distal FFR = 0.85, OM = 0.91 4. RCA: No significant stenosis. Proximal FFR = 0.91, Mid FFR = 0.89, Distal FFR = 0.85  IMPRESSION: 1. CT FFR analysis didn't show any significant stenosis.  Assessment and Plan:  1.  Nonischemic cardiomyopathy based on work-up as noted above.  She has nonobstructive CAD by cardiac CTA with FFR imaging.  Last LVEF 30 to 35%.  Medical therapy now includes Coreg, every other day Lasix, Lanoxin, and Entresto.  She has prior intolerance to Aldactone.  We will continue with  current regimen for now, I reviewed her lab work as noted above.  We will plan to obtain a follow-up echocardiogram prior to her next visit - she preferred to have this in September.  2.  Weakness as described above.  Seems to be out of proportion to her cardiomyopathy diagnosis, I recommended that she see her PCP for screening lab work and possibly consider PT.  I wonder if her nutritional status is also related.  Medication Adjustments/Labs and Tests Ordered: Current medicines are reviewed at length with the patient today.  Concerns regarding medicines are outlined above.   Tests Ordered: Orders Placed This Encounter  Procedures  . ECHOCARDIOGRAM COMPLETE    Medication Changes: No orders of the defined types were placed in this encounter.   Disposition:  Follow up September after echocardiogram in the [place] office.  Signed, Satira Sark, MD, [facility] 10/20/2019 2:37 PM    [facility] at [facility] [address] [place], [place] [address] Phone: [phone]; Fax: [phone]

## 2019-10-30 NOTE — Progress Notes (Signed)
Patient Care Team: Sharion Balloon, FNP as PCP - General (Family Medicine) Satira Sark, MD as PCP - Cardiology (Cardiology) Dr Geanie Berlin (Gynecology) Holley Bouche, NP (Inactive) as Nurse Practitioner (Nurse Practitioner) Nicholas Lose, MD as Consulting Physician (Hematology and Oncology) Fanny Skates, MD as Consulting Physician (General Surgery) Kyung Rudd, MD as Consulting Physician (Radiation Oncology) Sylvan Cheese, NP as Nurse Practitioner (Hematology and Oncology)  DIAGNOSIS:    ICD-10-CM   1. Malignant neoplasm of upper-outer quadrant of left breast in female, estrogen receptor negative ([place])  C50.412 MM DIAG BREAST TOMO BILATERAL   Z17.1 MM DIAG BREAST TOMO UNI RIGHT    SUMMARY OF ONCOLOGIC HISTORY: Oncology History  Breast cancer of upper-outer quadrant of left female breast ([place])  11/09/2014 Mammogram   Left breast irregular hypoechoic mass 12:00 position 5.3 x 3.7 x 2.9 cm, left low axilla normal sized lymph nodes   11/09/2014 Initial Diagnosis   Left Bx: IDC with DCIS; Grade 3, ER - (0%), PR- (0%), Her 2 Neg, Ki 67: 70%   11/26/2014 Breast MRI   3.5 x 4.5 x 3.8 cm biopsy-proven malignancy within the upper/ upper inner left breast. Scattered foci adjacent to the biopsy-proven malignancy and within the upper left breast are indeterminate.   11/26/2014 Clinical Stage   Stage IIB: T3 N0   11/30/2014 -  Neo-Adjuvant Chemotherapy   Dose dense Adriamycin and Cytoxan 4 followed by Abraxane weekly 12   04/05/2015 Breast MRI   Breast complete imaging response to neoadjuvant chemotherapy   04/30/2015 Surgery   Left mastectomy: IDC 2 mm residual disease, 1 lymph node positive for isolated tumor cells within normal lymphatics. 0/3 Lymph Nodes   04/30/2015 Pathologic Stage   Stage IA: pT1a pN0   06/28/2015 - 08/12/2015 Radiation Therapy   Adjuvant radiation therapy: Left chest wall and supraclavicular fossa 50.4 Gy over 28 fractions; left chest wall boost  10 Gy over 5 fractions. Total dose 60.4 Gy   12/02/2015 Survivorship   Survivorship care plan provided to patient     CHIEF COMPLIANT: Follow-up of left breast cancer, iron deficiency anemia   INTERVAL HISTORY: Sherry Bender is a 69 y.o. with above-mentioned history of iron deficiency anemia previously treated with IV iron. She also has a history of triple negative left breast cancer treated with neoadjuvant chemotherapy, mastectomy, radiation, and who is currently on observation. She presents to the clinic today for follow-up.   ALLERGIES:  is allergic to codeine and spironolactone.  MEDICATIONS:  Current Outpatient Medications  Medication Sig Dispense Refill  . aspirin EC 81 MG tablet Take 1 tablet (81 mg total) by mouth daily. 90 tablet 3  . atorvastatin (LIPITOR) 20 MG tablet Take 1 tablet (20 mg total) by mouth daily. 90 tablet 3  . carvedilol (COREG) 3.125 MG tablet Take 0.5 tablets (1.55 mg total) by mouth in the morning and at bedtime.    . digoxin (LANOXIN) 0.125 MG tablet Take 1 tablet (0.125 mg total) by mouth daily. 90 tablet 1  . estradiol (ESTRACE) 1 MG tablet TAKE 1 TABLET BY MOUTH EVERY DAY 90 tablet 1  . famotidine (PEPCID) 20 MG tablet Take 20 mg by mouth 2 (two) times daily as needed for heartburn or indigestion.    . furosemide (LASIX) 20 MG tablet Take 1 tablet (20 mg total) by mouth every other day. 45 tablet 3  . lidocaine (LIDODERM) 5 % Place 1 patch onto the skin daily. 30 patch 2  . polyvinyl alcohol (LIQUIFILM  TEARS) 1.4 % ophthalmic solution Place 1 drop into both eyes as needed for dry eyes.    . sacubitril-valsartan (ENTRESTO) 24-26 MG Take 1 tablet by mouth 2 (two) times daily. Due for follow up appt 180 tablet 3  . tolterodine (DETROL LA) 2 MG 24 hr capsule TAKE 1 CAPSULE BY MOUTH DAILY. 90 capsule 1   No current facility-administered medications for this visit.    PHYSICAL EXAMINATION: ECOG PERFORMANCE STATUS: 2 - Symptomatic, <50% confined to  bed  Vitals:   10/31/19 1126  BP: 128/66  Pulse: 76  Resp: 18  Temp: 98.2 F (36.8 C)  SpO2: 100%   Filed Weights   10/31/19 1126  Weight: 97 lb 4.8 oz (44.1 kg)    BREAST: Right breast without any lumps or nodules.  Left chest wall is without any concerns.. (exam performed in the presence of a chaperone)  LABORATORY DATA:  I have reviewed the data as listed CMP Latest Ref Rng & Units 09/12/2019 09/05/2019 06/18/2019  Glucose 70 - 99 mg/dL - - 87  BUN 8 - 23 mg/dL - - 11  Creatinine 0.44 - 1.00 mg/dL - 0.40(L) <0.30(L)  Sodium 135 - 145 mmol/L - - 130(L)  Potassium 3.5 - 5.1 mmol/L - - 4.8  Chloride 98 - 111 mmol/L - - 92(L)  CO2 22 - 32 mmol/L - - 29  Calcium 8.9 - 10.3 mg/dL - - 9.1  Total Protein 6.5 - 8.1 g/dL 6.8 - -  Total Bilirubin 0.3 - 1.2 mg/dL 0.6 - -  Alkaline Phos 38 - 126 U/L 46 - -  AST 15 - 41 U/L 46(H) - -  ALT 0 - 44 U/L 41 - -    Lab Results  Component Value Date   WBC 4.1 02/18/2019   HGB 13.1 02/18/2019   HCT 41.0 02/18/2019   MCV 93.8 02/18/2019   PLT 130 (L) 02/18/2019   NEUTROABS 3.2 02/18/2019    ASSESSMENT & PLAN:  Breast cancer of upper-outer quadrant of left female breast ([place]) 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 -------------------------------------------------------------------------------------------------------------------------------------------------------- Iron deficiency anemia I instructed the patient that she needs upper endoscopy and colonoscopy. IV iron given 12/02/2015 2 doses Response to IV iron therapy:Improvement in hemoglobin from 10.6 to13.8 Labs 09/03/2017: Hemoglobin 13.6 -------------------------------------------------------------------------------------------------------------------------------------------------------- Hypertension:Under her PCP care. Weight loss:We will ask our dietitian to call her to discuss some options.     Breast cancer surveillance: Mammogram 11/09/2017: Benign breast density category D I ordered a new mammogram to be done of the right breast. Breast exam no palpable lumps or nodules in the right breast.  Left chest wall is without any problems.  Return to clinic in 1 yearwith lab count check and follow-up.    Orders Placed This Encounter  Procedures  . MM DIAG BREAST TOMO BILATERAL    Standing Status:   Future    Standing Expiration Date:   10/30/2020    Order Specific Question:  Reason for Exam (SYMPTOM  OR DIAGNOSIS REQUIRED)    Answer:   surveillance of breast cancer    Order Specific Question:   Preferred imaging location?    Answer:   [facility]  . MM DIAG BREAST TOMO UNI RIGHT    Standing Status:   Future    Standing Expiration Date:   10/30/2020    Order Specific Question:   Reason for Exam (SYMPTOM  OR DIAGNOSIS REQUIRED)    Answer:   Annual mammograms with H/O breast cancer    Order Specific Question:   Preferred imaging location?    Answer:   [facility]   The patient has a good understanding of the overall plan. she agrees with it. she will call with any problems that may develop before the next visit here.  Total time spent: 20 mins including face to face time and time spent for planning, charting and  coordination of care  Nicholas Lose, MD 10/31/2019  I, Cloyde Reams Dorshimer, am acting as scribe for Dr. Nicholas Lose.  I have reviewed the above documentation for accuracy and completeness, and I agree with the above.

## 2019-10-31 ENCOUNTER — Telehealth: Payer: Self-pay | Admitting: Hematology and Oncology

## 2019-10-31 ENCOUNTER — Other Ambulatory Visit: Payer: Self-pay | Admitting: *Deleted

## 2019-10-31 ENCOUNTER — Inpatient Hospital Stay: Payer: 59 | Attending: Hematology and Oncology | Admitting: Hematology and Oncology

## 2019-10-31 ENCOUNTER — Other Ambulatory Visit: Payer: Self-pay

## 2019-10-31 DIAGNOSIS — Z7982 Long term (current) use of aspirin: Secondary | ICD-10-CM | POA: Insufficient documentation

## 2019-10-31 DIAGNOSIS — C50412 Malignant neoplasm of upper-outer quadrant of left female breast: Secondary | ICD-10-CM

## 2019-10-31 DIAGNOSIS — D509 Iron deficiency anemia, unspecified: Secondary | ICD-10-CM | POA: Diagnosis not present

## 2019-10-31 DIAGNOSIS — Z9012 Acquired absence of left breast and nipple: Secondary | ICD-10-CM | POA: Insufficient documentation

## 2019-10-31 DIAGNOSIS — Z171 Estrogen receptor negative status [ER-]: Secondary | ICD-10-CM | POA: Diagnosis not present

## 2019-10-31 DIAGNOSIS — G620 Drug-induced polyneuropathy: Secondary | ICD-10-CM | POA: Diagnosis not present

## 2019-10-31 DIAGNOSIS — Z9221 Personal history of antineoplastic chemotherapy: Secondary | ICD-10-CM | POA: Diagnosis not present

## 2019-10-31 DIAGNOSIS — I2510 Atherosclerotic heart disease of native coronary artery without angina pectoris: Secondary | ICD-10-CM

## 2019-10-31 DIAGNOSIS — Z853 Personal history of malignant neoplasm of breast: Secondary | ICD-10-CM | POA: Insufficient documentation

## 2019-10-31 DIAGNOSIS — I10 Essential (primary) hypertension: Secondary | ICD-10-CM | POA: Diagnosis not present

## 2019-10-31 DIAGNOSIS — Z923 Personal history of irradiation: Secondary | ICD-10-CM | POA: Diagnosis not present

## 2019-10-31 DIAGNOSIS — Z79899 Other long term (current) drug therapy: Secondary | ICD-10-CM | POA: Insufficient documentation

## 2019-10-31 MED ORDER — CARVEDILOL 3.125 MG PO TABS: 1.5500 mg | ORAL_TABLET | Freq: Two times a day (BID) | ORAL | Status: DC

## 2019-10-31 NOTE — Telephone Encounter (Signed)
Scheduled appts per 5/14 los. Gave pt a print out of AVS.

## 2019-10-31 NOTE — Assessment & Plan Note (Signed)
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 -------------------------------------------------------------------------------------------------------------------------------------------------------- Iron deficiency anemia I instructed the patient that she needs upper endoscopy and colonoscopy. IV iron given 12/02/2015 2 doses Response to IV iron therapy:Improvement in hemoglobin from 10.6 to13.8 Labs 09/03/2017: Hemoglobin 13.6 -------------------------------------------------------------------------------------------------------------------------------------------------------- Hypertension:Under her PCP care. Weight loss:Slight improvement.  GERD: OnDexilant.I renewed her prescription today.  Breast cancer surveillance: Mammogram 11/09/2017: Benign breast density category D Return to clinic in 1 yearwith lab count check and follow-up.

## 2019-10-31 NOTE — Telephone Encounter (Signed)
Scheduled appt per 5/14 sch message - unable to reach  Pt . Left message with appt date and time

## 2019-10-31 NOTE — Progress Notes (Signed)
Per MD request, referral placed to dietitian to discuss options on gaining weight.  Scheduling message also sent.

## 2019-11-03 ENCOUNTER — Ambulatory Visit (HOSPITAL_COMMUNITY): Payer: 59

## 2019-11-04 ENCOUNTER — Inpatient Hospital Stay: Payer: 59

## 2019-11-04 NOTE — Progress Notes (Signed)
Nutrition Assessment   Reason for Assessment:   Referral weight loss   ASSESSMENT:  69 year old female with history of breast cancer currently under observation.  Past medical history of HTN, heart failure, esophageal stricture.   Spoke with patient via phone.  Patient reports that she thinks she has a good appetite.  Does not prepare meals and does mostly frozen meals, goes out to eat.  Reports that she normally has toast and cereal for biscuit with gravy for breakfast, or egg sandwich.  Lunch is sandwich and chips or cup of noodles.  Dinner is out to eat and has meat and side item.  States that she drinks a2 milk and is lactose intolerant.  Can't tolerate boost/ensure shakes.    Patient reports that she had trouble swallowing and esphagus has been stretched.  Reports that she was afraid to eat during the time but eats more now.  Is not able to eat steak  Medications: pepcid, lasix   Labs: no new   Anthropometrics:   Height: 64 inches Weight: 97 lb 4.8 oz on 5/14 97 lb on 10/26/2017 Noted weight of 92 lb Jan 2021 BMI: 16   Estimated Energy Needs  Kcals: 1300-1500 Protein: 65-78 g Fluid: 1.3 L   NUTRITION DIAGNOSIS: Underweight related to breast cancer, esophageal stricture as evidenced by BMI 16    INTERVENTION:  Discussed easy to prepare meals as patient does not cook. Discussed strategies to increase calories and protein and will mail handouts. Patient requests smoothie recipes and will mail. Encouraged eating 3 meals per day and adding snacks between meals as patient reports good appetite.  Discussed calorie goal to obtain each day for weight gain. Encouraged patient to track calories per day.  Contact information given to patient.  Patient prefers to call RD if further questions or concerns vs scheduling f/u visit   Next Visit: no follow-up, patient to call RD with questions  Gabbi Whetstone B. Zenia Resides, [place], [place] Registered Dietitian [phone] (pager)

## 2019-11-11 ENCOUNTER — Other Ambulatory Visit: Payer: Self-pay | Admitting: Hematology and Oncology

## 2019-11-11 DIAGNOSIS — Z1231 Encounter for screening mammogram for malignant neoplasm of breast: Secondary | ICD-10-CM

## 2019-12-12 ENCOUNTER — Ambulatory Visit: Payer: 59

## 2019-12-17 ENCOUNTER — Telehealth: Payer: Self-pay | Admitting: Cardiology

## 2019-12-17 NOTE — Telephone Encounter (Signed)
Spoke with pt who c/o productive cough and SOB with exertion that started on Monday. Pt reports that phlegm is white in color. Pt denies chest pain and fever at this time. Encouraged pt to call PCP office. Pt states that she feels it is coming from one of her medicines. Please advise.

## 2019-12-17 NOTE — Telephone Encounter (Signed)
Pt states she is coughing a lot   Please call [phone]  Thanks renee

## 2019-12-18 NOTE — Telephone Encounter (Signed)
Noted.  Agree with having her evaluated by PCP in case she needs a chest x-ray.  Even though she is not febrile, having a productive cough would raise some concerns.  She is not on an ACE inhibitor which would be commonly implicated with a dry cough.  We can move up her office follow-up with [place] from September for reevaluation as well.

## 2019-12-18 NOTE — Telephone Encounter (Signed)
Pt notified and states that she does not want to move up her appt at this time. Pt will call back if she changes her mind.

## 2019-12-25 ENCOUNTER — Telehealth: Payer: Self-pay | Admitting: Family

## 2019-12-25 NOTE — Telephone Encounter (Signed)
error 

## 2019-12-26 ENCOUNTER — Ambulatory Visit (INDEPENDENT_AMBULATORY_CARE_PROVIDER_SITE_OTHER): Payer: 59 | Admitting: Family

## 2019-12-26 ENCOUNTER — Telehealth: Payer: Self-pay | Admitting: Nurse Practitioner

## 2019-12-26 ENCOUNTER — Encounter: Payer: Self-pay | Admitting: Family

## 2019-12-26 DIAGNOSIS — J209 Acute bronchitis, unspecified: Secondary | ICD-10-CM | POA: Diagnosis not present

## 2019-12-26 DIAGNOSIS — I2510 Atherosclerotic heart disease of native coronary artery without angina pectoris: Secondary | ICD-10-CM

## 2019-12-26 MED ORDER — BENZONATATE 200 MG PO CAPS: 200.0000 mg | ORAL_CAPSULE | Freq: Three times a day (TID) | ORAL | 1 refills | Status: DC | PRN

## 2019-12-26 MED ORDER — PREDNISONE 10 MG (21) PO TBPK: ORAL_TABLET | ORAL | 0 refills | Status: DC

## 2019-12-26 NOTE — Telephone Encounter (Signed)
Pt has a terrible cough, states she was seen at urgent care over the weekend and told she has bronchitis. States she is not any better. Discussed with her she needs to talk to her PCP regarding this issue. Pt also states she is having difficulty swallowing her food. She is ok swallowing liquids and is able to get her meds down, pt also thinks her ulcer is "acting up." Pt scheduled to see Dr. Silverio Decamp Monday at 1:10pm. Pt aware of appt.

## 2019-12-26 NOTE — Progress Notes (Signed)
Virtual Visit via telephone Note Due to COVID-19 pandemic this visit was conducted virtually. This visit type was conducted due to national recommendations for restrictions regarding the COVID-19 Pandemic (e.g. social distancing, sheltering in place) in an effort to limit this patient's exposure and mitigate transmission in our community. All issues noted in this document were discussed and addressed.  A physical exam was not performed with this format.  I connected with Sherry Bender on 12/26/19 at 2:28 pm  by telephone and verified that I am speaking with the correct person using two identifiers. Sherry Bender is currently located at home and no one is currently with her during visit. The provider, Evelina Dun, FNP is located in their office at time of visit.  I discussed the limitations, risks, security and privacy concerns of performing an evaluation and management service by telephone and the availability of in person appointments. I also discussed with the patient that there may be a patient responsible charge related to this service. The patient expressed understanding and agreed to proceed.   History and Present Illness:  Pt calls the office today cough. She reports she went to the Urgent Care on 12/20/19 and was given nebs, steroid injection, and Zpak. She had a negative chest x-ray.  Cough This is a new problem. The current episode started 1 to 4 weeks ago. The problem has been waxing and waning. The problem occurs every few minutes. The cough is non-productive. Associated symptoms include nasal congestion, rhinorrhea, shortness of breath (improved) and wheezing. Pertinent negatives include no ear congestion, ear pain, fever, headaches or sore throat. The symptoms are aggravated by lying down. She has tried rest for the symptoms. The treatment provided mild relief.      Review of Systems  Constitutional: Negative for fever.  HENT: Positive for rhinorrhea. Negative for ear pain and  sore throat.   Respiratory: Positive for cough, shortness of breath (improved) and wheezing.   Neurological: Negative for headaches.  All other systems reviewed and are negative.    Observations/Objective: No SOB or distress noted, no cough noted  Assessment and Plan: 1. Acute bronchitis, unspecified organism - Take meds as prescribed - Use a cool mist humidifier  -Use saline nose sprays frequently -Force fluids -For any cough or congestion  Use plain Mucinex- regular strength or max strength is fine -For fever or aces or pains- take tylenol or ibuprofen. -Throat lozenges if help -Pt will call if symptoms worsen or do not improve  - predniSONE (STERAPRED UNI-PAK 21 TAB) 10 MG (21) TBPK tablet; Use as directed  Dispense: 21 tablet; Refill: 0 - benzonatate (TESSALON) 200 MG capsule; Take 1 capsule (200 mg total) by mouth 3 (three) times daily as needed.  Dispense: 30 capsule; Refill: 1    I discussed the assessment and treatment plan with the patient. The patient was provided an opportunity to ask questions and all were answered. The patient agreed with the plan and demonstrated an understanding of the instructions.   The patient was advised to call back or seek an in-person evaluation if the symptoms worsen or if the condition fails to improve as anticipated.  The above assessment and management plan was discussed with the patient. The patient verbalized understanding of and has agreed to the management plan. Patient is aware to call the clinic if symptoms persist or worsen. Patient is aware when to return to the clinic for a follow-up visit. Patient educated on when it is appropriate to go to the emergency department.  Time call ended:  2:40 pm   I provided 12 minutes of non-face-to-face time during this encounter.    Evelina Dun, FNP

## 2019-12-29 ENCOUNTER — Encounter: Payer: Self-pay | Admitting: Gastroenterology

## 2019-12-29 ENCOUNTER — Ambulatory Visit (INDEPENDENT_AMBULATORY_CARE_PROVIDER_SITE_OTHER): Payer: 59 | Admitting: Gastroenterology

## 2019-12-29 VITALS — BP 128/80 | HR 84 | Ht 64.0 in | Wt 96.0 lb

## 2019-12-29 DIAGNOSIS — R634 Abnormal weight loss: Secondary | ICD-10-CM | POA: Diagnosis not present

## 2019-12-29 DIAGNOSIS — R1310 Dysphagia, unspecified: Secondary | ICD-10-CM

## 2019-12-29 DIAGNOSIS — I2510 Atherosclerotic heart disease of native coronary artery without angina pectoris: Secondary | ICD-10-CM

## 2019-12-29 NOTE — Patient Instructions (Addendum)
You have been scheduled for an endoscopy. Please follow written instructions given to you at your visit today. If you use inhalers (even only as needed), please bring them with you on the day of your procedure. Your physician has requested that you go to www.startemmi.com and enter the access code given to you at your visit today. This web site gives a general overview about your procedure. However, you should still follow specific instructions given to you by our office regarding your preparation for the procedure.   Gastroesophageal Reflux Disease, Adult Gastroesophageal reflux (GER) happens when acid from the stomach flows up into the tube that connects the mouth and the stomach (esophagus). Normally, food travels down the esophagus and stays in the stomach to be digested. However, when a person has GER, food and stomach acid sometimes move back up into the esophagus. If this becomes a more serious problem, the person may be diagnosed with a disease called gastroesophageal reflux disease (GERD). GERD occurs when the reflux:  Happens often.  Causes frequent or severe symptoms.  Causes problems such as damage to the esophagus. When stomach acid comes in contact with the esophagus, the acid may cause soreness (inflammation) in the esophagus. Over time, GERD may create small holes (ulcers) in the lining of the esophagus. What are the causes? This condition is caused by a problem with the muscle between the esophagus and the stomach (lower esophageal sphincter, or LES). Normally, the LES muscle closes after food passes through the esophagus to the stomach. When the LES is weakened or abnormal, it does not close properly, and that allows food and stomach acid to go back up into the esophagus. The LES can be weakened by certain dietary substances, medicines, and medical conditions, including:  Tobacco use.  Pregnancy.  Having a hiatal hernia.  Alcohol use.  Certain foods and beverages, such as  coffee, chocolate, onions, and peppermint. What increases the risk? You are more likely to develop this condition if you:  Have an increased body weight.  Have a connective tissue disorder.  Use NSAID medicines. What are the signs or symptoms? Symptoms of this condition include:  Heartburn.  Difficult or painful swallowing.  The feeling of having a lump in the throat.  Abitter taste in the mouth.  Bad breath.  Having a large amount of saliva.  Having an upset or bloated stomach.  Belching.  Chest pain. Different conditions can cause chest pain. Make sure you see your health care provider if you experience chest pain.  Shortness of breath or wheezing.  Ongoing (chronic) cough or a night-time cough.  Wearing away of tooth enamel.  Weight loss. How is this diagnosed? Your health care provider will take a medical history and perform a physical exam. To determine if you have mild or severe GERD, your health care provider may also monitor how you respond to treatment. You may also have tests, including:  A test to examine your stomach and esophagus with a small camera (endoscopy).  A test thatmeasures the acidity level in your esophagus.  A test thatmeasures how much pressure is on your esophagus.  A barium swallow or modified barium swallow test to show the shape, size, and functioning of your esophagus. How is this treated? The goal of treatment is to help relieve your symptoms and to prevent complications. Treatment for this condition may vary depending on how severe your symptoms are. Your health care provider may recommend:  Changes to your diet.  Medicine.  Surgery. Follow  these instructions at home: Eating and drinking   Follow a diet as recommended by your health care provider. This may involve avoiding foods and drinks such as: ? Coffee and tea (with or without caffeine). ? Drinks that containalcohol. ? Energy drinks and sports  drinks. ? Carbonated drinks or sodas. ? Chocolate and cocoa. ? Peppermint and mint flavorings. ? Garlic and onions. ? Horseradish. ? Spicy and acidic foods, including peppers, chili powder, curry powder, vinegar, hot sauces, and barbecue sauce. ? Citrus fruit juices and citrus fruits, such as oranges, lemons, and limes. ? Tomato-based foods, such as red sauce, chili, salsa, and pizza with red sauce. ? Fried and fatty foods, such as donuts, french fries, potato chips, and high-fat dressings. ? High-fat meats, such as hot dogs and fatty cuts of red and white meats, such as rib eye steak, sausage, ham, and bacon. ? High-fat dairy items, such as whole milk, butter, and cream cheese.  Eat small, frequent meals instead of large meals.  Avoid drinking large amounts of liquid with your meals.  Avoid eating meals during the 2-3 hours before bedtime.  Avoid lying down right after you eat.  Do not exercise right after you eat. Lifestyle   Do not use any products that contain nicotine or tobacco, such as cigarettes, e-cigarettes, and chewing tobacco. If you need help quitting, ask your health care provider.  Try to reduce your stress by using methods such as yoga or meditation. If you need help reducing stress, ask your health care provider.  If you are overweight, reduce your weight to an amount that is healthy for you. Ask your health care provider for guidance about a safe weight loss goal. General instructions  Pay attention to any changes in your symptoms.  Take over-the-counter and prescription medicines only as told by your health care provider. Do not take aspirin, ibuprofen, or other NSAIDs unless your health care provider told you to do so.  Wear loose-fitting clothing. Do not wear anything tight around your waist that causes pressure on your abdomen.  Raise (elevate) the head of your bed about 6 inches (15 cm).  Avoid bending over if this makes your symptoms worse.  Keep all  follow-up visits as told by your health care provider. This is important. Contact a health care provider if:  You have: ? New symptoms. ? Unexplained weight loss. ? Difficulty swallowing or it hurts to swallow. ? Wheezing or a persistent cough. ? A hoarse voice.  Your symptoms do not improve with treatment. Get help right away if you:  Have pain in your arms, neck, jaw, teeth, or back.  Feel sweaty, dizzy, or light-headed.  Have chest pain or shortness of breath.  Vomit and your vomit looks like blood or coffee grounds.  Faint.  Have stool that is bloody or black.  Cannot swallow, drink, or eat. Summary  Gastroesophageal reflux happens when acid from the stomach flows up into the esophagus. GERD is a disease in which the reflux happens often, causes frequent or severe symptoms, or causes problems such as damage to the esophagus.  Treatment for this condition may vary depending on how severe your symptoms are. Your health care provider may recommend diet and lifestyle changes, medicine, or surgery.  Contact a health care provider if you have new or worsening symptoms.  Take over-the-counter and prescription medicines only as told by your health care provider. Do not take aspirin, ibuprofen, or other NSAIDs unless your health care provider told you to  do so.  Keep all follow-up visits as told by your health care provider. This is important. This information is not intended to replace advice given to you by your health care provider. Make sure you discuss any questions you have with your health care provider. Document Revised: 12/12/2017 Document Reviewed: 12/12/2017 Elsevier Patient Education  2020 [employer] for Gastroesophageal Reflux Disease, Adult When you have gastroesophageal reflux disease (GERD), the foods you eat and your eating habits are very important. Choosing the right foods can help ease your discomfort. Think about working with a nutrition  specialist (dietitian) to help you make good choices. What are tips for following this plan?  Meals  Choose healthy foods that are low in fat, such as fruits, vegetables, whole grains, low-fat dairy products, and lean meat, fish, and poultry.  Eat small meals often instead of 3 large meals a day. Eat your meals slowly, and in a place where you are relaxed. Avoid bending over or lying down until 2-3 hours after eating.  Avoid eating meals 2-3 hours before bed.  Avoid drinking a lot of liquid with meals.  Cook foods using methods other than frying. Bake, grill, or broil food instead.  Avoid or limit: ? Chocolate. ? Peppermint or spearmint. ? Alcohol. ? Pepper. ? Black and decaffeinated coffee. ? Black and decaffeinated tea. ? Bubbly (carbonated) soft drinks. ? Caffeinated energy drinks and soft drinks.  Limit high-fat foods such as: ? Fatty meat or fried foods. ? Whole milk, cream, butter, or ice cream. ? Nuts and nut butters. ? Pastries, donuts, and sweets made with butter or shortening.  Avoid foods that cause symptoms. These foods may be different for everyone. Common foods that cause symptoms include: ? Tomatoes. ? Oranges, lemons, and limes. ? Peppers. ? Spicy food. ? Onions and garlic. ? Vinegar. Lifestyle  Maintain a healthy weight. Ask your doctor what weight is healthy for you. If you need to lose weight, work with your doctor to do so safely.  Exercise for at least 30 minutes for 5 or more days each week, or as told by your doctor.  Wear loose-fitting clothes.  Do not smoke. If you need help quitting, ask your doctor.  Sleep with the head of your bed higher than your feet. Use a wedge under the mattress or blocks under the bed frame to raise the head of the bed. Summary  When you have gastroesophageal reflux disease (GERD), food and lifestyle choices are very important in easing your symptoms.  Eat small meals often instead of 3 large meals a day. Eat your  meals slowly, and in a place where you are relaxed.  Limit high-fat foods such as fatty meat or fried foods.  Avoid bending over or lying down until 2-3 hours after eating.  Avoid peppermint and spearmint, caffeine, alcohol, and chocolate. This information is not intended to replace advice given to you by your health care provider. Make sure you discuss any questions you have with your health care provider. Document Revised: 09/26/2018 Document Reviewed: 07/11/2016 Elsevier Patient Education  [employer].   I appreciate the  opportunity to care for you  Thank You   Harl Bowie , MD

## 2019-12-29 NOTE — Progress Notes (Signed)
Sherry Bender    [id number]    03-01-1951  Primary Care Physician:Hawks, Theador Hawthorne, FNP  Referring Physician: Sharion Balloon, [place] [address] [place],  [place] [address]   Chief complaint:  Dysphagia  HPI:  69 year old female with history of carcinoma s/p resection, chemoradiation here with complaints of progressive dysphagia to mostly solids and occasional liquids  Has lost significant weight, has stabilized no longer losing weight but she has not been able to gain much weight either.  She has intermittent bilateral lower abdominal pain  Her diet is restricted to mostly soft foods.  Denies any food impaction but she regurgitates food with most meals.  Has intermittent cough and mucus Intermittent cough and mucus  EGD July 14, 2016: Diffuse esophagitis in lower esophagus.  Biopsies negative for Candida, intestinal metaplasia or malignancy.  Small hiatal hernia and gastric ulcer, negative H. Pylori.  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  Other relevant medical history includes nonischemic cardiomyopathy, last LVEF 30 to 35%  Outpatient Encounter Medications as of 12/29/2019  Medication Sig  . aspirin EC 81 MG tablet Take 1 tablet (81 mg total) by mouth daily.  Marland Kitchen atorvastatin (LIPITOR) 20 MG tablet Take 1 tablet (20 mg total) by mouth daily.  . benzonatate (TESSALON) 200 MG capsule Take 1 capsule (200 mg total) by mouth 3 (three) times daily as needed.  . carvedilol (COREG) 3.125 MG tablet Take 0.5 tablets (1.55 mg total) by mouth in the morning and at bedtime.  . digoxin (LANOXIN) 0.125 MG tablet Take 1 tablet (0.125 mg total) by mouth daily.  Marland Kitchen estradiol (ESTRACE) 1 MG tablet TAKE 1 TABLET BY MOUTH EVERY DAY  . famotidine (PEPCID) 20 MG tablet Take 20 mg by mouth 2 (two) times daily as  needed for heartburn or indigestion.  . furosemide (LASIX) 20 MG tablet Take 1 tablet (20 mg total) by mouth every other day.  . lidocaine (LIDODERM) 5 % Place 1 patch onto the skin daily.  . polyvinyl alcohol (LIQUIFILM TEARS) 1.4 % ophthalmic solution Place 1 drop into both eyes as needed for dry eyes.  . predniSONE (STERAPRED UNI-PAK 21 TAB) 10 MG (21) TBPK tablet Use as directed  . sacubitril-valsartan (ENTRESTO) 24-26 MG Take 1 tablet by mouth 2 (two) times daily. Due for follow up appt  . tolterodine (DETROL LA) 2 MG 24 hr capsule TAKE 1 CAPSULE BY MOUTH DAILY.   No facility-administered encounter medications on file as of 12/29/2019.    Allergies as of 12/29/2019 - Review Complete 12/26/2019  Allergen Reaction Noted  . Codeine Nausea And Vomiting 09/26/2010  . Spironolactone  07/15/2019    Past Medical History:  Diagnosis Date  . Anxiety   . Arthritis   . Breast cancer ([facility]) 2016  . Cardiomyopathy ([facility])    a. EF 25-30% by echocardiogram in 02/2019  . Coronary atherosclerosis    Cardiac CTA with negative FFR, nonobstructive March 2021  . Cricopharyngeus muscle dysfunction   . Gastric ulcer   . GERD (gastroesophageal reflux disease)   . Hiatal hernia   . Hyperlipidemia   . Hypertension   . Scoliosis   . Skin cancer   . Urinary frequency     Past Surgical History:  Procedure Laterality Date  . ABDOMINAL HYSTERECTOMY  2000  TAH/BSO  . ANTERIOR CERVICAL DECOMP/DISCECTOMY FUSION N/A 06/18/2014   Procedure: C4-5 C5-6 C6-7 ANTERIOR CERVICAL DECOMPRESSION/DISKECTOMY/FUSION;  Surgeon: Eustace Moore, MD;  Location: [facility];  Service: Neurosurgery;  Laterality: N/A;  C4-5 C5-6 C6-7 ANTERIOR CERVICAL DECOMPRESSION/DISKECTOMY/FUSION  . ANTERIOR CERVICAL DECOMP/DISCECTOMY FUSION N/A 06/25/2014   Procedure: EXPLORATION OF CERVICAL FUSION WITH REVIOSN OF HARDWARE;  Surgeon: Eustace Moore, MD;  Location: [facility];  Service: Neurosurgery;  Laterality: N/A;  . CERVICAL FUSION     . CHOLECYSTECTOMY  1994  . COMPLETE MASTECTOMY W/ SENTINEL NODE BIOPSY Left 04/30/2015  . FOOT NEUROMA SURGERY  01/2010   also had metatarasl shortened  . KNEE ARTHROSCOPY  2005   right knee  . MASTECTOMY Left 2016  . PORT-A-CATH REMOVAL Right 04/30/2015   Procedure: REMOVAL PORT-A-CATH;  Surgeon: Fanny Skates, MD;  Location: [facility];  Service: General;  Laterality: Right;  . PORTACATH PLACEMENT N/A 11/23/2014   Procedure: INSERTION PORT-A-CATH WITH ULTRASOUND;  Surgeon: Fanny Skates, MD;  Location: [facility];  Service: General;  Laterality: N/A;  . RHINOPLASTY    . SIMPLE MASTECTOMY WITH AXILLARY SENTINEL NODE BIOPSY Left 04/30/2015   Procedure: LEFT TOTAL  MASTECTOMY WITH LEFT AXILLARY SENTINEL NODE BIOPSY;  Surgeon: Fanny Skates, MD;  Location: [facility];  Service: General;  Laterality: Left;    Family History  Problem Relation Age of Onset  . Heart disease Mother   . Hypertension Mother   . Diabetes Mother   . Heart disease Father   . Hypertension Father   . Cancer Maternal Grandmother        ? ovarian cancer vs. cervical  . Cancer Paternal Aunt        ? breast    Social History   Socioeconomic History  . Marital status: Married    Spouse name: Not on file  . Number of children: 2  . Years of education: Not on file  . Highest education level: Not on file  Occupational History  . Not on file  Tobacco Use  . Smoking status: Former Smoker    Packs/day: 0.25    Years: 42.00    Pack years: 10.50    Types: Cigarettes    Quit date: 01/24/2017    Years since quitting: 2.9  . Smokeless tobacco: Never Used  Vaping Use  . Vaping Use: Never used  Substance and Sexual Activity  . Alcohol use: No  . Drug use: No  . Sexual activity: Never  Other Topics Concern  . 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     Two daughters born  27 and 62- one in [place] one in [place].             Social Determinants of Health   Financial Resource Strain:   . Difficulty of Paying Living Expenses:   Food Insecurity:   . Worried About Charity fundraiser in the Last Year:   . Arboriculturist in the Last Year:   Transportation Needs:   . Film/video editor (Medical):   Marland Kitchen Lack of Transportation (Non-Medical):   Physical Activity:   . Days of Exercise per Week:   . Minutes of Exercise per Session:   Stress:   . Feeling of Stress :   Social Connections:   . Frequency  of Communication with Friends and Family:   . Frequency of Social Gatherings with Friends and Family:   . Attends Religious Services:   . Active Member of Clubs or Organizations:   . Attends Archivist Meetings:   Marland Kitchen Marital Status:   Intimate Partner Violence:   . Fear of Current or Ex-Partner:   . Emotionally Abused:   Marland Kitchen Physically Abused:   . Sexually Abused:       Review of systems: All other review of systems negative except as mentioned in the HPI.   Physical Exam: Vitals:   12/29/19 1253  BP: 128/80  Pulse: 84   Body mass index is 16.48 kg/m. 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 All questions answered. The patient agrees to proceed.  She is past due for colorectal cancer screening, patient does  not want to do Cologuard or colonoscopy at this point of time.  Will rediscuss once upper GI issues/dysphagia resolves dysphagia resolves at next visit   The patient was provided an opportunity to ask questions and all were answered. The patient agreed with the plan and demonstrated an understanding of the instructions.  Damaris Hippo , MD    CC: Sharion Balloon, FNP

## 2019-12-29 NOTE — H&P (View-Only) (Signed)
Sherry Bender    [id number]    06-23-1950  Primary Care Physician:Hawks, Theador Hawthorne, FNP  Referring Physician: Sharion Balloon, [address] [place],  [place] [address]   Chief complaint:  Dysphagia  HPI:  69 year old female with history of carcinoma s/p resection, chemoradiation here with complaints of progressive dysphagia to mostly solids and occasional liquids  Has lost significant weight, has stabilized no longer losing weight but she has not been able to gain much weight either.  She has intermittent bilateral lower abdominal pain  Her diet is restricted to mostly soft foods.  Denies any food impaction but she regurgitates food with most meals.  Has intermittent cough and mucus Intermittent cough and mucus  EGD July 14, 2016: Diffuse esophagitis in lower esophagus.  Biopsies negative for Candida, intestinal metaplasia or malignancy.  Small hiatal hernia and gastric ulcer, negative H. Pylori.  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  Other relevant medical history includes nonischemic cardiomyopathy, last LVEF 30 to 35%  Outpatient Encounter Medications as of 12/29/2019  Medication Sig  . aspirin EC 81 MG tablet Take 1 tablet (81 mg total) by mouth daily.  Marland Kitchen atorvastatin (LIPITOR) 20 MG tablet Take 1 tablet (20 mg total) by mouth daily.  . benzonatate (TESSALON) 200 MG capsule Take 1 capsule (200 mg total) by mouth 3 (three) times daily as needed.  . carvedilol (COREG) 3.125 MG tablet Take 0.5 tablets (1.55 mg total) by mouth in the morning and at bedtime.  . digoxin (LANOXIN) 0.125 MG tablet Take 1 tablet (0.125 mg total) by mouth daily.  Marland Kitchen estradiol (ESTRACE) 1 MG tablet TAKE 1 TABLET BY MOUTH EVERY DAY  . famotidine (PEPCID) 20 MG tablet Take 20 mg by mouth 2 (two) times daily as  needed for heartburn or indigestion.  . furosemide (LASIX) 20 MG tablet Take 1 tablet (20 mg total) by mouth every other day.  . lidocaine (LIDODERM) 5 % Place 1 patch onto the skin daily.  . polyvinyl alcohol (LIQUIFILM TEARS) 1.4 % ophthalmic solution Place 1 drop into both eyes as needed for dry eyes.  . predniSONE (STERAPRED UNI-PAK 21 TAB) 10 MG (21) TBPK tablet Use as directed  . sacubitril-valsartan (ENTRESTO) 24-26 MG Take 1 tablet by mouth 2 (two) times daily. Due for follow up appt  . tolterodine (DETROL LA) 2 MG 24 hr capsule TAKE 1 CAPSULE BY MOUTH DAILY.   No facility-administered encounter medications on file as of 12/29/2019.    Allergies as of 12/29/2019 - Review Complete 12/26/2019  Allergen Reaction Noted  . Codeine Nausea And Vomiting 09/26/2010  . Spironolactone  07/15/2019    Past Medical History:  Diagnosis Date  . Anxiety   . Arthritis   . Breast cancer ([place]) 2016  . Cardiomyopathy ([place])    a. EF 25-30% by echocardiogram in 02/2019  . Coronary atherosclerosis    Cardiac CTA with negative FFR, nonobstructive March 2021  . Cricopharyngeus muscle dysfunction   . Gastric ulcer   . GERD (gastroesophageal reflux disease)   . Hiatal hernia   . Hyperlipidemia   . Hypertension   . Scoliosis   . Skin cancer   . Urinary frequency     Past Surgical History:  Procedure Laterality Date  . ABDOMINAL HYSTERECTOMY  2000  TAH/BSO  . ANTERIOR CERVICAL DECOMP/DISCECTOMY FUSION N/A 06/18/2014   Procedure: C4-5 C5-6 C6-7 ANTERIOR CERVICAL DECOMPRESSION/DISKECTOMY/FUSION;  Surgeon: Eustace Moore, MD;  Location: [facility];  Service: Neurosurgery;  Laterality: N/A;  C4-5 C5-6 C6-7 ANTERIOR CERVICAL DECOMPRESSION/DISKECTOMY/FUSION  . ANTERIOR CERVICAL DECOMP/DISCECTOMY FUSION N/A 06/25/2014   Procedure: EXPLORATION OF CERVICAL FUSION WITH REVIOSN OF HARDWARE;  Surgeon: Eustace Moore, MD;  Location: [facility];  Service: Neurosurgery;  Laterality: N/A;  . CERVICAL FUSION     . CHOLECYSTECTOMY  1994  . COMPLETE MASTECTOMY W/ SENTINEL NODE BIOPSY Left 04/30/2015  . FOOT NEUROMA SURGERY  01/2010   also had metatarasl shortened  . KNEE ARTHROSCOPY  2005   right knee  . MASTECTOMY Left 2016  . PORT-A-CATH REMOVAL Right 04/30/2015   Procedure: REMOVAL PORT-A-CATH;  Surgeon: Fanny Skates, MD;  Location: [facility];  Service: General;  Laterality: Right;  . PORTACATH PLACEMENT N/A 11/23/2014   Procedure: INSERTION PORT-A-CATH WITH ULTRASOUND;  Surgeon: Fanny Skates, MD;  Location: [facility];  Service: General;  Laterality: N/A;  . RHINOPLASTY    . SIMPLE MASTECTOMY WITH AXILLARY SENTINEL NODE BIOPSY Left 04/30/2015   Procedure: LEFT TOTAL  MASTECTOMY WITH LEFT AXILLARY SENTINEL NODE BIOPSY;  Surgeon: Fanny Skates, MD;  Location: [facility];  Service: General;  Laterality: Left;    Family History  Problem Relation Age of Onset  . Heart disease Mother   . Hypertension Mother   . Diabetes Mother   . Heart disease Father   . Hypertension Father   . Cancer Maternal Grandmother        ? ovarian cancer vs. cervical  . Cancer Paternal Aunt        ? breast    Social History   Socioeconomic History  . Marital status: Married    Spouse name: Not on file  . Number of children: 2  . Years of education: Not on file  . Highest education level: Not on file  Occupational History  . Not on file  Tobacco Use  . Smoking status: Former Smoker    Packs/day: 0.25    Years: 42.00    Pack years: 10.50    Types: Cigarettes    Quit date: 01/24/2017    Years since quitting: 2.9  . Smokeless tobacco: Never Used  Vaping Use  . Vaping Use: Never used  Substance and Sexual Activity  . Alcohol use: No  . Drug use: No  . Sexual activity: Never  Other Topics Concern  . 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     Two daughters born  49 and 44- one in [place] one in [place].             Social Determinants of Health   Financial Resource Strain:   . Difficulty of Paying Living Expenses:   Food Insecurity:   . Worried About Charity fundraiser in the Last Year:   . Arboriculturist in the Last Year:   Transportation Needs:   . Film/video editor (Medical):   Marland Kitchen Lack of Transportation (Non-Medical):   Physical Activity:   . Days of Exercise per Week:   . Minutes of Exercise per Session:   Stress:   . Feeling of Stress :   Social Connections:   . Frequency  of Communication with Friends and Family:   . Frequency of Social Gatherings with Friends and Family:   . Attends Religious Services:   . Active Member of Clubs or Organizations:   . Attends Archivist Meetings:   Marland Kitchen Marital Status:   Intimate Partner Violence:   . Fear of Current or Ex-Partner:   . Emotionally Abused:   Marland Kitchen Physically Abused:   . Sexually Abused:       Review of systems: All other review of systems negative except as mentioned in the HPI.   Physical Exam: Vitals:   12/29/19 1253  BP: 128/80  Pulse: 84   Body mass index is 16.48 kg/m. 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 All questions answered. The patient agrees to proceed.  She is past due for colorectal cancer screening, patient does  not want to do Cologuard or colonoscopy at this point of time.  Will rediscuss once upper GI issues/dysphagia resolves dysphagia resolves at next visit   The patient was provided an opportunity to ask questions and all were answered. The patient agreed with the plan and demonstrated an understanding of the instructions.  Damaris Hippo , MD    CC: Sharion Balloon, FNP

## 2019-12-31 ENCOUNTER — Encounter: Payer: Self-pay | Admitting: Gastroenterology

## 2019-12-31 ENCOUNTER — Other Ambulatory Visit: Payer: Self-pay | Admitting: *Deleted

## 2019-12-31 ENCOUNTER — Telehealth: Payer: Self-pay | Admitting: *Deleted

## 2019-12-31 DIAGNOSIS — R1310 Dysphagia, unspecified: Secondary | ICD-10-CM

## 2019-12-31 DIAGNOSIS — R634 Abnormal weight loss: Secondary | ICD-10-CM

## 2019-12-31 NOTE — Telephone Encounter (Signed)
We cancelled Sherry Bender EGD due to low EF, We are trying to r/s at [place]

## 2019-12-31 NOTE — Telephone Encounter (Signed)
Patient has been rescheduled at 1:05pm at [facility] for her EGD she will go get a RAPID Covid test at 8:55am at [address]  Orders entered by Georgiann Mccoy Endo charge Nurse ......   Patient aware of test and time and location changes    01/01/2020 at [facility]

## 2020-01-01 ENCOUNTER — Encounter (HOSPITAL_COMMUNITY): Payer: Self-pay | Admitting: Gastroenterology

## 2020-01-01 ENCOUNTER — Other Ambulatory Visit: Payer: Self-pay

## 2020-01-01 ENCOUNTER — Encounter (HOSPITAL_COMMUNITY)
Admission: RE | Disposition: A | Discharge status: Home / Self Care | Payer: Self-pay | Source: Home / Self Care | Attending: Gastroenterology

## 2020-01-01 ENCOUNTER — Ambulatory Visit: Payer: 59

## 2020-01-01 ENCOUNTER — Ambulatory Visit (HOSPITAL_COMMUNITY): Payer: 59 | Admitting: Certified Registered"

## 2020-01-01 ENCOUNTER — Other Ambulatory Visit (HOSPITAL_COMMUNITY)
Admission: RE | Admit: 2020-01-01 | Discharge: 2020-01-01 | Disposition: A | Discharge status: Home / Self Care | Payer: 59 | Source: Ambulatory Visit | Attending: Gastroenterology | Admitting: Gastroenterology

## 2020-01-01 ENCOUNTER — Encounter: Payer: 59 | Admitting: Gastroenterology

## 2020-01-01 ENCOUNTER — Ambulatory Visit (HOSPITAL_COMMUNITY)
Admission: RE | Admit: 2020-01-01 | Discharge: 2020-01-01 | Disposition: A | Discharge status: Home / Self Care | Payer: 59 | Attending: Gastroenterology | Admitting: Gastroenterology

## 2020-01-01 DIAGNOSIS — K3189 Other diseases of stomach and duodenum: Secondary | ICD-10-CM | POA: Insufficient documentation

## 2020-01-01 DIAGNOSIS — Z923 Personal history of irradiation: Secondary | ICD-10-CM | POA: Diagnosis not present

## 2020-01-01 DIAGNOSIS — Z9221 Personal history of antineoplastic chemotherapy: Secondary | ICD-10-CM | POA: Diagnosis not present

## 2020-01-01 DIAGNOSIS — Z79899 Other long term (current) drug therapy: Secondary | ICD-10-CM | POA: Insufficient documentation

## 2020-01-01 DIAGNOSIS — K449 Diaphragmatic hernia without obstruction or gangrene: Secondary | ICD-10-CM | POA: Insufficient documentation

## 2020-01-01 DIAGNOSIS — K2970 Gastritis, unspecified, without bleeding: Secondary | ICD-10-CM

## 2020-01-01 DIAGNOSIS — Z85828 Personal history of other malignant neoplasm of skin: Secondary | ICD-10-CM | POA: Insufficient documentation

## 2020-01-01 DIAGNOSIS — M1990 Unspecified osteoarthritis, unspecified site: Secondary | ICD-10-CM | POA: Diagnosis not present

## 2020-01-01 DIAGNOSIS — K2100 Gastro-esophageal reflux disease with esophagitis, without bleeding: Secondary | ICD-10-CM | POA: Diagnosis not present

## 2020-01-01 DIAGNOSIS — Z7982 Long term (current) use of aspirin: Secondary | ICD-10-CM | POA: Diagnosis not present

## 2020-01-01 DIAGNOSIS — I10 Essential (primary) hypertension: Secondary | ICD-10-CM | POA: Diagnosis not present

## 2020-01-01 DIAGNOSIS — Z87891 Personal history of nicotine dependence: Secondary | ICD-10-CM | POA: Diagnosis not present

## 2020-01-01 DIAGNOSIS — Z888 Allergy status to other drugs, medicaments and biological substances status: Secondary | ICD-10-CM | POA: Insufficient documentation

## 2020-01-01 DIAGNOSIS — K259 Gastric ulcer, unspecified as acute or chronic, without hemorrhage or perforation: Secondary | ICD-10-CM | POA: Diagnosis not present

## 2020-01-01 DIAGNOSIS — I428 Other cardiomyopathies: Secondary | ICD-10-CM | POA: Diagnosis not present

## 2020-01-01 DIAGNOSIS — I2510 Atherosclerotic heart disease of native coronary artery without angina pectoris: Secondary | ICD-10-CM | POA: Diagnosis not present

## 2020-01-01 DIAGNOSIS — Z20822 Contact with and (suspected) exposure to covid-19: Secondary | ICD-10-CM | POA: Diagnosis not present

## 2020-01-01 DIAGNOSIS — Z853 Personal history of malignant neoplasm of breast: Secondary | ICD-10-CM | POA: Insufficient documentation

## 2020-01-01 DIAGNOSIS — K253 Acute gastric ulcer without hemorrhage or perforation: Secondary | ICD-10-CM

## 2020-01-01 DIAGNOSIS — R1310 Dysphagia, unspecified: Secondary | ICD-10-CM

## 2020-01-01 DIAGNOSIS — R634 Abnormal weight loss: Secondary | ICD-10-CM

## 2020-01-01 DIAGNOSIS — Z885 Allergy status to narcotic agent status: Secondary | ICD-10-CM | POA: Diagnosis not present

## 2020-01-01 DIAGNOSIS — K2990 Gastroduodenitis, unspecified, without bleeding: Secondary | ICD-10-CM

## 2020-01-01 HISTORY — PX: ESOPHAGOGASTRODUODENOSCOPY (EGD) WITH PROPOFOL: SHX5813

## 2020-01-01 HISTORY — PX: BIOPSY: SHX5522

## 2020-01-01 LAB — SARS CORONAVIRUS 2 BY RT PCR (HOSPITAL ORDER, PERFORMED IN ~~LOC~~ HOSPITAL LAB): SARS Coronavirus 2: NEGATIVE

## 2020-01-01 SURGERY — ESOPHAGOGASTRODUODENOSCOPY (EGD) WITH PROPOFOL
Anesthesia: Monitor Anesthesia Care

## 2020-01-01 MED ORDER — PROPOFOL 10 MG/ML IV BOLUS
INTRAVENOUS | Status: DC | PRN
  Administered 2020-01-01 (×3): 10 mg via INTRAVENOUS

## 2020-01-01 MED ORDER — LIDOCAINE 2% (20 MG/ML) 5 ML SYRINGE
INTRAMUSCULAR | Status: DC | PRN
  Administered 2020-01-01: 40 mg via INTRAVENOUS

## 2020-01-01 MED ORDER — MIDAZOLAM HCL 5 MG/5ML IJ SOLN
INTRAMUSCULAR | Status: DC | PRN
  Administered 2020-01-01: 1 mg via INTRAVENOUS

## 2020-01-01 MED ORDER — LACTATED RINGERS IV SOLN
INTRAVENOUS | Status: DC
  Administered 2020-01-01: 1000 mL via INTRAVENOUS

## 2020-01-01 MED ORDER — FENTANYL CITRATE (PF) 100 MCG/2ML IJ SOLN
INTRAMUSCULAR | Status: AC
  Filled 2020-01-01: qty 2

## 2020-01-01 MED ORDER — SUCRALFATE 1 G PO TABS
1.0000 g | ORAL_TABLET | Freq: Two times a day (BID) | ORAL | 1 refills | Status: DC
Start: 2020-01-01 — End: 2020-05-17

## 2020-01-01 MED ORDER — PROPOFOL 500 MG/50ML IV EMUL
INTRAVENOUS | Status: DC | PRN
  Administered 2020-01-01: 50 ug/kg/min via INTRAVENOUS

## 2020-01-01 MED ORDER — MIDAZOLAM HCL 2 MG/2ML IJ SOLN
INTRAMUSCULAR | Status: AC
  Filled 2020-01-01: qty 2

## 2020-01-01 MED ORDER — PANTOPRAZOLE SODIUM 40 MG PO TBEC
40.0000 mg | DELAYED_RELEASE_TABLET | Freq: Every day | ORAL | 1 refills | Status: DC
Start: 2020-01-01 — End: 2020-01-20

## 2020-01-01 MED ORDER — FENTANYL CITRATE (PF) 100 MCG/2ML IJ SOLN
INTRAMUSCULAR | Status: DC | PRN
  Administered 2020-01-01: 50 ug via INTRAVENOUS

## 2020-01-01 MED ORDER — PHENYLEPHRINE 40 MCG/ML (10ML) SYRINGE FOR IV PUSH (FOR BLOOD PRESSURE SUPPORT)
PREFILLED_SYRINGE | INTRAVENOUS | Status: DC | PRN
  Administered 2020-01-01: 40 ug via INTRAVENOUS

## 2020-01-01 MED ORDER — SODIUM CHLORIDE 0.9 % IV SOLN: INTRAVENOUS | Status: DC

## 2020-01-01 SURGICAL SUPPLY — 14 items

## 2020-01-01 NOTE — Anesthesia Preprocedure Evaluation (Addendum)
Anesthesia Evaluation  Patient identified by MRN, date of birth, ID band Patient awake    Reviewed: Allergy & Precautions, NPO status , Patient's Chart, lab work & pertinent test results  History of Anesthesia Complications Negative for: history of anesthetic complications  Airway Mallampati: II  TM Distance: >3 FB Neck ROM: Full    Dental  (+) Dental Advisory Given, Caps   Pulmonary shortness of breath, COPD, former smoker,  01/01/2020 SARS coronavirus NEG   breath sounds clear to auscultation       Cardiovascular hypertension, Pt. on medications and Pt. on home beta blockers (-) angina+CHF (02/2019 ECHO: EF 25-30%)   Rhythm:Regular Rate:Normal  05/2019 ECHO: EF 30-35%, valves OK  08/2019 Cardiac CT: no coronary obstruction   Neuro/Psych Anxiety    GI/Hepatic Neg liver ROS, GERD  Poorly Controlled,Esophageal stricture   Endo/Other  negative endocrine ROS  Renal/GU negative Renal ROS     Musculoskeletal  (+) Arthritis ,   Abdominal   Peds  Hematology   Anesthesia Other Findings H/o breast cancer: chemo and XRT  Reproductive/Obstetrics                            Anesthesia Physical Anesthesia Plan  ASA: III  Anesthesia Plan: MAC   Post-op Pain Management:    Induction:   PONV Risk Score and Plan: 2 and Treatment may vary due to age or medical condition  Airway Management Planned: Natural Airway and Nasal Cannula  Additional Equipment: None  Intra-op Plan:   Post-operative Plan:   Informed Consent: I have reviewed the patients History and Physical, chart, labs and discussed the procedure including the risks, benefits and alternatives for the proposed anesthesia with the patient or authorized representative who has indicated his/her understanding and acceptance.     Dental advisory given  Plan Discussed with: CRNA and Surgeon  Anesthesia Plan Comments:         Anesthesia Quick Evaluation

## 2020-01-01 NOTE — Discharge Instructions (Signed)
YOU HAD AN ENDOSCOPIC PROCEDURE TODAY: Refer to the procedure report and other information in the discharge instructions given to you for any specific questions about what was found during the examination. If this information does not answer your questions, please call [place] office at [phone] to clarify.   YOU SHOULD EXPECT: Some feelings of bloating in the abdomen. Passage of more gas than usual. Walking can help get rid of the air that was put into your GI tract during the procedure and reduce the bloating. If you had a lower endoscopy (such as a colonoscopy or flexible sigmoidoscopy) you may notice spotting of blood in your stool or on the toilet paper. Some abdominal soreness may be present for a day or two, also.  DIET: Your first meal following the procedure should be a light meal and then it is ok to progress to your normal diet. A half-sandwich or bowl of soup is an example of a good first meal. Heavy or fried foods are harder to digest and may make you feel nauseous or bloated. Drink plenty of fluids but you should avoid alcoholic beverages for 24 hours. If you had a esophageal dilation, please see attached instructions for diet.    ACTIVITY: Your care partner should take you home directly after the procedure. You should plan to take it easy, moving slowly for the rest of the day. You can resume normal activity the day after the procedure however YOU SHOULD NOT DRIVE, use power tools, machinery or perform tasks that involve climbing or major physical exertion for 24 hours (because of the sedation medicines used during the test).   SYMPTOMS TO REPORT IMMEDIATELY: A gastroenterologist can be reached at any hour. Please call [phone]  for any of the following symptoms:   Following upper endoscopy (EGD, EUS, ERCP, esophageal dilation) Vomiting of blood or coffee ground material  New, significant abdominal pain  New, significant chest pain or pain under the shoulder blades  Painful or  persistently difficult swallowing  New shortness of breath  Black, tarry-looking or red, bloody stools  FOLLOW UP:  If any biopsies were taken you will be contacted by phone or by letter within the next 1-3 weeks. Call [phone]  if you have not heard about the biopsies in 3 weeks.  Please also call with any specific questions about appointments or follow up tests.  

## 2020-01-01 NOTE — Interval H&P Note (Signed)
History and Physical Interval Note:  01/01/2020 12:00 PM  Sherry Bender  has presented today for surgery, with the diagnosis of Dysphagia.  The various methods of treatment have been discussed with the patient and family. After consideration of risks, benefits and other options for treatment, the patient has consented to  Procedure(s): ESOPHAGOGASTRODUODENOSCOPY (EGD) WITH PROPOFOL (N/A) as a surgical intervention.  The patient's history has been reviewed, patient examined, no change in status, stable for surgery.  I have reviewed the patient's chart and labs.  Questions were answered to the patient's satisfaction.     Journee Kohen

## 2020-01-01 NOTE — Op Note (Signed)
[facility] Patient Name: Sherry Bender Procedure Date: 01/01/2020 MRN: [id number] Attending MD: Mauri Pole , MD Date of Birth: 01/13/1951 CSN: [id number] Age: 69 Admit Type: Outpatient Procedure:                Upper GI endoscopy Indications:              Dysphagia Providers:                Mauri Pole, MD, Clyde Lundborg, RN, Corie Chiquito, Technician, Adair Laundry, CRNA Referring MD:              Medicines:                Monitored Anesthesia Care Complications:            No immediate complications. Estimated Blood Loss:     Estimated blood loss was minimal. Procedure:                Pre-Anesthesia Assessment:                           - Prior to the procedure, a History and Physical                            was performed, and patient medications and                            allergies were reviewed. The patient's tolerance of                            previous anesthesia was also reviewed. The risks                            and benefits of the procedure and the sedation                            options and risks were discussed with the patient.                            All questions were answered, and informed consent                            was obtained. Prior Anticoagulants: The patient has                            taken no previous anticoagulant or antiplatelet                            agents. ASA Grade Assessment: III - A patient with                            severe systemic disease. After reviewing the risks  and benefits, the patient was deemed in                            satisfactory condition to undergo the procedure.                           After obtaining informed consent, the endoscope was                            passed under direct vision. Throughout the                            procedure, the patient's blood pressure, pulse, and                             oxygen saturations were monitored continuously. The                            [id number]-[id number] ([id number]) was introduced through the                            mouth, and advanced to the second part of duodenum.                            The upper GI endoscopy was accomplished without                            difficulty. The patient tolerated the procedure                            well. Scope In: Scope Out: Findings:      LA Grade C (one or more mucosal breaks continuous between tops of 2 or       more mucosal folds, less than 75% circumference) esophagitis with no       bleeding was found 34 to 35 cm from the incisors. Retained secretions in       the esophagus suggestive of possible underlying esophageal dysmotility.      The gastroesophageal flap valve was visualized endoscopically and       classified as Hill Grade III (minimal fold, loose to endoscope, hiatal       hernia likely).      Two non-bleeding cratered gastric ulcers with a clean ulcer base       (Forrest Class III) were found in the prepyloric region of the stomach.       The largest lesion was 3 mm in largest dimension. Biopsies were taken       with a cold forceps for histology.      Patchy mild inflammation characterized by adherent blood, congestion       (edema), erosions and erythema was found in the entire examined stomach.       Biopsies were taken with a cold forceps for Helicobacter pylori testing.      Patchy mildly erythematous mucosa without active bleeding was found in       the first portion of the duodenum. Impression:               -  LA Grade C reflux esophagitis with no bleeding.                           - Gastroesophageal flap valve classified as Hill                            Grade III (minimal fold, loose to endoscope, hiatal                            hernia likely).                           - Non-bleeding gastric ulcers with a clean ulcer                            base (Forrest Class III).  Biopsied.                           - Gastritis. Biopsied.                           - Erythematous duodenopathy. Moderate Sedation:      Not Applicable - Patient had care per Anesthesia. Recommendation:           - Patient has a contact number available for                            emergencies. The signs and symptoms of potential                            delayed complications were discussed with the                            patient. Return to normal activities tomorrow.                            Written discharge instructions were provided to the                            patient.                           - Resume previous diet.                           - Continue present medications.                           - Await pathology results.                           - Follow an antireflux regimen indefinitely.                           - Use Protonix (pantoprazole) 40 mg PO daily, 30  minutes before breakfast.                           - Use sucralfate tablets 1 gram PO BID for 2 months.                           - Return GI office in 2-3 months Procedure Code(s):        --- Professional ---                           [phone], Esophagogastroduodenoscopy, flexible,                            transoral; with biopsy, single or multiple Diagnosis Code(s):        --- Professional ---                           K21.00, Gastro-esophageal reflux disease with                            esophagitis, without bleeding                           K25.9, Gastric ulcer, unspecified as acute or                            chronic, without hemorrhage or perforation                           K29.70, Gastritis, unspecified, without bleeding                           K31.89, Other diseases of stomach and duodenum                           R13.10, Dysphagia, unspecified CPT copyright 2019 American Medical Association. All rights reserved. The codes documented in this report are  preliminary and upon coder review may  be revised to meet current compliance requirements. Mauri Pole, MD 01/01/2020 1:34:11 PM This report has been signed electronically. Number of Addenda: 0

## 2020-01-01 NOTE — Anesthesia Postprocedure Evaluation (Signed)
Anesthesia Post Note  Patient: Sherry Bender  Procedure(s) Performed: ESOPHAGOGASTRODUODENOSCOPY (EGD) WITH PROPOFOL (N/A ) BIOPSY     Patient location during evaluation: Endoscopy Anesthesia Type: MAC Level of consciousness: awake and alert, oriented and patient cooperative Pain management: pain level controlled Vital Signs Assessment: post-procedure vital signs reviewed and stable Respiratory status: spontaneous breathing, nonlabored ventilation and respiratory function stable Cardiovascular status: blood pressure returned to baseline and stable Postop Assessment: no apparent nausea or vomiting Anesthetic complications: no   No complications documented.  Last Vitals:  Vitals:   01/01/20 1330 01/01/20 1340  BP: (!) 127/56 (!) 133/53  Pulse: 86 74  Resp: 18 20  Temp: 36.7 C   SpO2: 92% 97%    Last Pain:  Vitals:   01/01/20 1340  TempSrc:   PainSc: 0-No pain                 [name],E. Zenobia Kuennen

## 2020-01-01 NOTE — Transfer of Care (Signed)
Immediate Anesthesia Transfer of Care Note  Patient: Riot Barrick  Procedure(s) Performed: ESOPHAGOGASTRODUODENOSCOPY (EGD) WITH PROPOFOL (N/A ) BIOPSY  Patient Location: Endoscopy Unit  Anesthesia Type:MAC  Level of Consciousness: drowsy and patient cooperative  Airway & Oxygen Therapy: Patient Spontanous Breathing and Patient connected to face mask oxygen  Post-op Assessment: Report given to RN and Post -op Vital signs reviewed and stable  Post vital signs: Reviewed and stable  Last Vitals:  Vitals Value Taken Time  BP    Temp    Pulse 85 01/01/20 1329  Resp 13 01/01/20 1329  SpO2 94 % 01/01/20 1329  Vitals shown include unvalidated device data.  Last Pain:  Vitals:   01/01/20 1148  TempSrc: Oral  PainSc: 0-No pain         Complications: No complications documented.

## 2020-01-02 ENCOUNTER — Encounter (HOSPITAL_COMMUNITY): Payer: Self-pay | Admitting: Gastroenterology

## 2020-01-02 ENCOUNTER — Other Ambulatory Visit: Payer: Self-pay

## 2020-01-02 LAB — SURGICAL PATHOLOGY

## 2020-01-07 ENCOUNTER — Telehealth: Payer: Self-pay

## 2020-01-07 NOTE — Telephone Encounter (Signed)
Patient stated she had severe headache and vomiting after taking protonix   She hasnt  taken any more since yesterday and feels somewhat better also she hasnt taken any more of the carafate either because she doesn't know which one is making her sick  Please advise

## 2020-01-08 NOTE — Telephone Encounter (Signed)
Scheduled patient for a follow up appointment first available was 9/22..... Patient stated now she has a bad cough and she doesn't know what to do about it... Wants advisement ,  OTC cough syrup is not helping     She stated her husband is in the hospital and she has no transportation to come in for an appointment to see an APP any sooner   Dr Silverio Decamp please advise

## 2020-01-08 NOTE — Telephone Encounter (Signed)
Okay please schedule follow-up office visit, next available appointment to discuss medication management.

## 2020-01-08 NOTE — Telephone Encounter (Signed)
Please send Magic mouthwash without lidocaine 5cc Q6-8h prn, will bring her in to office when she is able to with in next 2-4 weeks. Please advise patient to continue with antireflux measures Thanks

## 2020-01-09 MED ORDER — MAGIC MOUTHWASH: ORAL | 1 refills | Status: DC

## 2020-01-09 NOTE — Telephone Encounter (Signed)
Magic mouthwash sent to patients pharmacy (Faxed to [facility])   Resent Magic mouth wash to pharmacy without lidocaine Called pharmacy and informed them to cancel other rx for magic mouthwash without lidocaine  Informed patient of new prescription   Dr Silverio Decamp this is to swallow correct? Pharmacy wanted to clarify,  swish and swallow or swish and spit  Thanks

## 2020-01-09 NOTE — Telephone Encounter (Signed)
Yes, she will swish and swallow.  Thank you

## 2020-01-19 NOTE — Progress Notes (Signed)
Cardiology Office Note    Date:  01/20/2020   ID:  Sherry Bender, DOB [birth year], MRN [id number]  PCP:  Sharion Balloon, FNP  Cardiologist: Rozann Lesches, MD    Chief Complaint  Patient presents with  . Follow-up    3 month visit    History of Present Illness:    Sherry Bender is a 69 y.o. female with past medical history of chronic systolic CHF (EF 25 to 40% by echo in 02/2019, at 30-35% by repeat echo in 05/2019), CAD (s/p coronary CT in 08/2019 showing moderate CAD but not significant by FFR), history of breast cancer (s/p mastectomy and radiation) and HLD who presents to the office today for 79-month follow-up.   She was last examined by Dr. Domenic Polite in 10/2019 and reported chronic weakness at that time, specifically along her lower extremities. Denied any chest pain but did have chronic dyspnea. She was continued on ASA, Atorvastatin 20mg  daily, Entresto 24-26mg  BID, Coreg 3.125mg  BID, Lanoxin 0.125mg  daily and Lasix 20mg  every other day with plans for a repeat echo in 02/2020.   She did undergo Endoscopy in 12/2019 which showed LA Grade C reflux esophagitis with no bleeding, gastritis and non-bleeding gastric ulcers. She called our office in 12/2019 reporting a dry cough, therefore closer follow-up was arranged.  In talking with the patient and her husband today, she reports she was started on Magic mouthwash by GI and experienced resolution of her cough. She reports still having baseline dyspnea on exertion but feels like this has improved some within the course of the past several months. She describes occasional episodes of chest discomfort which lasts for a few seconds and resolve. She has not had to utilize SL NTG. She does have baseline 2 pillow orthopnea but denies any PND or lower extremity edema. She has only utilized Lasix once in the past month.   Past Medical History:  Diagnosis Date  . Anxiety   . Arthritis   . Breast cancer ([place]) 2016  . Cardiomyopathy ([place])     a. EF 25-30% by echocardiogram in 02/2019  . Coronary atherosclerosis    Cardiac CTA with negative FFR, nonobstructive March 2021  . Cricopharyngeus muscle dysfunction   . Gastric ulcer   . GERD (gastroesophageal reflux disease)   . Hiatal hernia   . Hyperlipidemia   . Hypertension   . Scoliosis   . Skin cancer   . Urinary frequency     Past Surgical History:  Procedure Laterality Date  . ABDOMINAL HYSTERECTOMY  2000   TAH/BSO  . ANTERIOR CERVICAL DECOMP/DISCECTOMY FUSION N/A 06/18/2014   Procedure: C4-5 C5-6 C6-7 ANTERIOR CERVICAL DECOMPRESSION/DISKECTOMY/FUSION;  Surgeon: Eustace Moore, MD;  Location: [facility];  Service: Neurosurgery;  Laterality: N/A;  C4-5 C5-6 C6-7 ANTERIOR CERVICAL DECOMPRESSION/DISKECTOMY/FUSION  . ANTERIOR CERVICAL DECOMP/DISCECTOMY FUSION N/A 06/25/2014   Procedure: EXPLORATION OF CERVICAL FUSION WITH REVIOSN OF HARDWARE;  Surgeon: Eustace Moore, MD;  Location: [facility];  Service: Neurosurgery;  Laterality: N/A;  . BIOPSY  01/01/2020   Procedure: BIOPSY;  Surgeon: Mauri Pole, MD;  Location: [facility];  Service: Endoscopy;;  . CERVICAL FUSION    . CHOLECYSTECTOMY  1994  . COMPLETE MASTECTOMY W/ SENTINEL NODE BIOPSY Left 04/30/2015  . ESOPHAGOGASTRODUODENOSCOPY (EGD) WITH PROPOFOL N/A 01/01/2020   Procedure: ESOPHAGOGASTRODUODENOSCOPY (EGD) WITH PROPOFOL;  Surgeon: Mauri Pole, MD;  Location: [facility];  Service: Endoscopy;  Laterality: N/A;  . FOOT NEUROMA SURGERY  01/2010   also had metatarasl  shortened  . KNEE ARTHROSCOPY  2005   right knee  . MASTECTOMY Left 2016  . PORT-A-CATH REMOVAL Right 04/30/2015   Procedure: REMOVAL PORT-A-CATH;  Surgeon: Fanny Skates, MD;  Location: [facility];  Service: General;  Laterality: Right;  . PORTACATH PLACEMENT N/A 11/23/2014   Procedure: INSERTION PORT-A-CATH WITH ULTRASOUND;  Surgeon: Fanny Skates, MD;  Location: [facility];  Service: General;  Laterality: N/A;  . RHINOPLASTY     . SIMPLE MASTECTOMY WITH AXILLARY SENTINEL NODE BIOPSY Left 04/30/2015   Procedure: LEFT TOTAL  MASTECTOMY WITH LEFT AXILLARY SENTINEL NODE BIOPSY;  Surgeon: Fanny Skates, MD;  Location: [facility];  Service: General;  Laterality: Left;    Current Medications: Outpatient Medications Prior to Visit  Medication Sig Dispense Refill  . aspirin EC 81 MG tablet Take 1 tablet (81 mg total) by mouth daily. 90 tablet 3  . atorvastatin (LIPITOR) 80 MG tablet Take 80 mg by mouth daily.    . carvedilol (COREG) 3.125 MG tablet Take 0.5 tablets (1.55 mg total) by mouth in the morning and at bedtime.    Marland Kitchen estradiol (ESTRACE) 1 MG tablet TAKE 1 TABLET BY MOUTH EVERY DAY (Patient taking differently: Take 1 mg by mouth every other day. ) 90 tablet 1  . furosemide (LASIX) 20 MG tablet Take 20 mg by mouth daily as needed for edema.    . lidocaine (LIDODERM) 5 % Place 1 patch onto the skin daily. (Patient taking differently: Place 1 patch onto the skin daily as needed (pain). ) 30 patch 2  . Lidocaine 4 % PTCH Apply 1 patch topically daily.    Marland Kitchen Propylene Glycol (SYSTANE BALANCE) 0.6 % SOLN Place 1 drop into both eyes daily as needed (dry eyes).    . sacubitril-valsartan (ENTRESTO) 24-26 MG Take 1 tablet by mouth 2 (two) times daily. Due for follow up appt 180 tablet 3  . digoxin (LANOXIN) 0.125 MG tablet TAKE 1 TABLET BY MOUTH DAILY 90 tablet 1  . tolterodine (DETROL LA) 2 MG 24 hr capsule TAKE 1 CAPSULE BY MOUTH DAILY. (Patient taking differently: Take 2 mg by mouth daily. ) 90 capsule 1  . sucralfate (CARAFATE) 1 g tablet Take 1 tablet (1 g total) by mouth 2 (two) times daily. (Patient not taking: Reported on 01/20/2020) 60 tablet 1  . pantoprazole (PROTONIX) 40 MG tablet Take 1 tablet (40 mg total) by mouth daily. 30 tablet 1   No facility-administered medications prior to visit.     Allergies:   Codeine and Spironolactone   Social History   Socioeconomic History  . Marital status: Married    Spouse name:  Not on file  . Number of children: 2  . Years of education: Not on file  . Highest education level: Not on file  Occupational History  . Not on file  Tobacco Use  . Smoking status: Former Smoker    Packs/day: 0.25    Years: 42.00    Pack years: 10.50    Types: Cigarettes    Quit date: 01/24/2017    Years since quitting: 2.9  . Smokeless tobacco: Never Used  Vaping Use  . Vaping Use: Never used  Substance and Sexual Activity  . Alcohol use: No  . Drug use: No  . Sexual activity: Never  Other Topics Concern  . 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     Two daughters born 51 and 64- one in [place] one in [place].             Social Determinants of Health   Financial Resource Strain:   . Difficulty of Paying Living Expenses:   Food Insecurity:   . Worried About Charity fundraiser in the Last Year:   . Arboriculturist in the Last Year:   Transportation Needs:   . Film/video editor (Medical):   Marland Kitchen Lack of Transportation (Non-Medical):   Physical Activity:   . Days of Exercise per Week:   . Minutes of Exercise per Session:   Stress:   . Feeling of Stress :   Social Connections:   . Frequency of Communication with Friends and Family:   . Frequency of Social Gatherings with Friends and Family:   . Attends Religious Services:   . Active Member of Clubs or Organizations:   . Attends Archivist Meetings:   Marland Kitchen Marital Status:      Family History:  The patient's family history includes Cancer in her maternal grandmother and paternal aunt; Diabetes in her mother; Heart disease in her father and mother; Hypertension in her father and mother.   Review of Systems:   Please see the history of present illness.     General:  No chills, fever, night sweats or weight changes.  Cardiovascular:  No chest pain, edema,  palpitations, paroxysmal nocturnal dyspnea. Positive for dyspnea on exertion and orthopnea (at baseline).  Dermatological: No rash, lesions/masses Respiratory: No cough, dyspnea Urologic: No hematuria, dysuria Abdominal:   No nausea, vomiting, diarrhea, bright red blood per rectum, melena, or hematemesis Neurologic:  No visual changes, wkns, changes in mental status. All other systems reviewed and are otherwise negative except as noted above.   Physical Exam:    VS:  BP 132/82   Pulse 75   Ht 5\' 4"  (1.626 m)   Wt 92 lb 3.2 oz (41.8 kg)   LMP 02/28/1999   BMI 15.83 kg/m    General: Thin, Caucasian female appearing in no acute distress. Head: Normocephalic, atraumatic, sclera non-icteric.  Neck: No carotid bruits. JVD not elevated.  Lungs: Respirations regular and unlabored, without wheezes or rales.  Heart: Regular rate and rhythm. No S3 or S4.  No murmur, no rubs, or gallops appreciated. Abdomen: Soft, non-tender, non-distended. No obvious abdominal masses. Msk:  Strength and tone appear normal for age. No obvious joint deformities or effusions. Extremities: No clubbing or cyanosis. No lower extremity edema.  Distal pedal pulses are 2+ bilaterally. Neuro: Alert and oriented X 3. Moves all extremities spontaneously. No focal deficits noted. Psych:  Responds to questions appropriately with a normal affect. Skin: No rashes or lesions noted  Wt Readings from Last 3 Encounters:  01/20/20 92 lb 3.2 oz (41.8 kg)  12/29/19 96 lb (43.5 kg)  10/31/19 97 lb 4.8 oz (44.1 kg)     Studies/Labs Reviewed:   EKG:  EKG is ordered today.  The ekg ordered today demonstrates NSR, HR 75 with no acute ST abnormalities when compared to prior tracings.   Recent Labs: 02/18/2019: Hemoglobin 13.1; Platelets 130 03/03/2019: Magnesium 1.8 06/18/2019: B Natriuretic Peptide 46.0; BUN 11; Potassium 4.8; Sodium 130 09/05/2019: Creatinine, Ser 0.40 09/12/2019: ALT 41   Lipid Panel    Component Value  Date/Time   CHOL 138 09/12/2019 1518   CHOL 200 (H) 12/26/2018 1525   TRIG  58 09/12/2019 1518   HDL 68 09/12/2019 1518   HDL 93 12/26/2018 1525   CHOLHDL 2.0 09/12/2019 1518   VLDL 12 09/12/2019 1518   LDLCALC 58 09/12/2019 1518   LDLCALC 90 12/26/2018 1525    Additional studies/ records that were reviewed today include:   Echocardiogram: 05/2019 IMPRESSIONS    1. Left ventricular ejection fraction, by visual estimation, is 30 to  35%. The left ventricle has moderately decreased function. There is no  left ventricular hypertrophy.  2. Left ventricular diastolic parameters are indeterminate.  3. The left ventricle demonstrates global hypokinesis.  4. Global right ventricle has low normal systolic function.The right  ventricular size is normal. No increase in right ventricular wall  thickness.  5. Left atrial size was normal.  6. Right atrial size was normal.  7. The mitral valve is normal in structure. No evidence of mitral valve  regurgitation. No evidence of mitral stenosis.  8. The tricuspid valve is normal in structure.  9. The aortic valve is normal in structure. Aortic valve regurgitation is  not visualized. No evidence of aortic valve sclerosis or stenosis.  10. The pulmonic valve was not well visualized. Pulmonic valve  regurgitation is not visualized.  11. The inferior vena cava is normal in size with greater than 50%  respiratory variability, suggesting right atrial pressure of 3 mmHg.  12. The interatrial septum was not well visualized.    Coronary CT: 08/2019  IMPRESSION: 1. Moderate CAD in proximal LAD with positive remodeling, and mid circumflex, CADRADS = 3. CT FFR will be performed and reported separately.  2. Coronary artery calcium score is 905, which places the patient in the 97 percentile.  3. Normal coronary origin with right dominance.   1. Left Main:  No significant stenosis. FFR = 0.92  2. LAD: No significant stenosis. Proximal  FFR = 0.91, Mid FFR = 0.89, Distal FFR = 0.84 3. LCX: No significant stenosis. Proximal FFR = 0.95, Distal FFR = 0.85, OM = 0.91 4. RCA: No significant stenosis. Proximal FFR = 0.91, Mid FFR = 0.89, Distal FFR = 0.85  IMPRESSION: 1.  CT FFR analysis didn't show any significant stenosis.  RECOMMENDATIONS: Goal directed medical therapy for secondary prevention of CAD. Aggressive risk factor modification.   Assessment:    1. Chronic systolic congestive heart failure ([place])   2. Nonischemic cardiomyopathy ([place])   3. Coronary artery disease of native artery of native heart with stable angina pectoris ([place])   4. Hyperlipidemia LDL goal <70   5. SUI (stress urinary incontinence, female)      Plan:   In order of problems listed above:  1. Chronic Systolic CHF/ NICM - She has a known reduced EF of 25 to 30% by echo in 02/2019, at 30-35% by repeat echo in 06/2019. She appears euvolemic by examination and reports her breathing has overall been stable. - Will continue current medication regimen with Coreg 3.125 mg twice daily (she has been cutting this in half at times due to fatigue), Digoxin 0.125mg  daily, Entresto 24-26 mg twice daily and Lasix 20 mg as needed. Her BP has previously limited titration of her regimen but is at 132/82 during today's visit.  I encouraged her to track BP at home and keep a record of this. She is scheduled for a repeat echocardiogram next month and pending review of this, would attempt to further titrate Coreg or Entresto if BP allows. She was previously intolerant to Spironolactone.  2. CAD - Coronary CT  in 08/2019 showed moderate CAD as outlined above but not significant by FFR. She still has baseline dyspnea on exertion and reports some intermittent chest discomfort which lasts for seconds and spontaneously resolves. - She is scheduled for repeat echocardiogram next month.  Continue ASA 81 mg daily, Coreg and Atorvastatin 80mg  daily.   3. HLD - FLP in  08/2019 showed total cholesterol 138, triglycerides 58, HDL 68 LDL 58. At goal of LDL less than 70. She remains on Atorvastatin 80 mg daily.  4. Urinary Incontinence - This has been managed by her PCP but she reports being unable to obtain refills from them due to no recent follow-up and she is almost out of the medication. I informed her I would provide a 30-day supply of Detrol LA but further refills need to come from her PCP. She plans to follow-up with them either physically or have a virtual visit within the next month.   Medication Adjustments/Labs and Tests Ordered: Current medicines are reviewed at length with the patient today.  Concerns regarding medicines are outlined above.  Medication changes, Labs and Tests ordered today are listed in the Patient Instructions below. Patient Instructions  Medication Instructions:  Your physician recommends that you continue on your current medications as directed. Please refer to the Current Medication list given to you today.  *If you need a refill on your cardiac medications before your next appointment, please call your pharmacy*   Lab Work: NONE   If you have labs (blood work) drawn today and your tests are completely normal, you will receive your results only by: Marland Kitchen MyChart Message (if you have MyChart) OR . A paper copy in the mail If you have any lab test that is abnormal or we need to change your treatment, we will call you to review the results.   Testing/Procedures: Your physician has requested that you have an echocardiogram. Echocardiography is a painless test that uses sound waves to create images of your heart. It provides your doctor with information about the size and shape of your heart and how well your heart's chambers and valves are working. This procedure takes approximately one hour. There are no restrictions for this procedure.  Keep scheduled appointment    Follow-Up: At [facility], you and your health needs are  our priority.  As part of our continuing mission to provide you with exceptional heart care, we have created designated Provider Care Teams.  These Care Teams include your primary Cardiologist (physician) and Advanced Practice Providers (APPs -  Physician Assistants and Nurse Practitioners) who all work together to provide you with the care you need, when you need it.  We recommend signing up for the patient portal called "MyChart".  Sign up information is provided on this After Visit Summary.  MyChart is used to connect with patients for Virtual Visits (Telemedicine).  Patients are able to view lab/test results, encounter notes, upcoming appointments, etc.  Non-urgent messages can be sent to your provider as well.   To learn more about what you can do with MyChart, go to [email or web address].    Your next appointment:   2-3  month(s)  The format for your next appointment:   In Person  Provider:   Dr. Domenic Polite    Other Instructions Thank you for choosing [facility]!       Signed, Erma Heritage, PA-C  01/20/2020 5:08 PM    [place]. [address] [place], [place] [address] Phone: [phone]  Fax: [phone]

## 2020-01-20 ENCOUNTER — Ambulatory Visit (INDEPENDENT_AMBULATORY_CARE_PROVIDER_SITE_OTHER): Payer: 59 | Admitting: Student

## 2020-01-20 ENCOUNTER — Other Ambulatory Visit: Payer: Self-pay | Admitting: Family

## 2020-01-20 ENCOUNTER — Other Ambulatory Visit: Payer: Self-pay | Admitting: Cardiology

## 2020-01-20 ENCOUNTER — Encounter: Payer: Self-pay | Admitting: Student

## 2020-01-20 ENCOUNTER — Other Ambulatory Visit: Payer: Self-pay

## 2020-01-20 VITALS — BP 132/82 | HR 75 | Ht 64.0 in | Wt 92.2 lb

## 2020-01-20 DIAGNOSIS — E785 Hyperlipidemia, unspecified: Secondary | ICD-10-CM | POA: Diagnosis not present

## 2020-01-20 DIAGNOSIS — N393 Stress incontinence (female) (male): Secondary | ICD-10-CM

## 2020-01-20 DIAGNOSIS — I428 Other cardiomyopathies: Secondary | ICD-10-CM

## 2020-01-20 DIAGNOSIS — I25118 Atherosclerotic heart disease of native coronary artery with other forms of angina pectoris: Secondary | ICD-10-CM | POA: Diagnosis not present

## 2020-01-20 DIAGNOSIS — I2510 Atherosclerotic heart disease of native coronary artery without angina pectoris: Secondary | ICD-10-CM

## 2020-01-20 DIAGNOSIS — I5022 Chronic systolic (congestive) heart failure: Secondary | ICD-10-CM | POA: Diagnosis not present

## 2020-01-20 MED ORDER — DIGOXIN 125 MCG PO TABS: 125.0000 ug | ORAL_TABLET | Freq: Every day | ORAL | 3 refills | Status: DC

## 2020-01-20 MED ORDER — TOLTERODINE TARTRATE ER 2 MG PO CP24: 2.0000 mg | ORAL_CAPSULE | Freq: Every day | ORAL | 0 refills | Status: DC

## 2020-01-20 NOTE — Telephone Encounter (Signed)
[facility]. NTBS LOV 12/26/18

## 2020-01-20 NOTE — Telephone Encounter (Signed)
[id number]-cb 8/3 

## 2020-01-20 NOTE — Patient Instructions (Signed)
Medication Instructions:  Your physician recommends that you continue on your current medications as directed. Please refer to the Current Medication list given to you today.  *If you need a refill on your cardiac medications before your next appointment, please call your pharmacy*   Lab Work: NONE   If you have labs (blood work) drawn today and your tests are completely normal, you will receive your results only by: Marland Kitchen MyChart Message (if you have MyChart) OR . A paper copy in the mail If you have any lab test that is abnormal or we need to change your treatment, we will call you to review the results.   Testing/Procedures: Your physician has requested that you have an echocardiogram. Echocardiography is a painless test that uses sound waves to create images of your heart. It provides your doctor with information about the size and shape of your heart and how well your heart's chambers and valves are working. This procedure takes approximately one hour. There are no restrictions for this procedure.  Keep scheduled appointment    Follow-Up: At [facility], you and your health needs are our priority.  As part of our continuing mission to provide you with exceptional heart care, we have created designated Provider Care Teams.  These Care Teams include your primary Cardiologist (physician) and Advanced Practice Providers (APPs -  Physician Assistants and Nurse Practitioners) who all work together to provide you with the care you need, when you need it.  We recommend signing up for the patient portal called "MyChart".  Sign up information is provided on this After Visit Summary.  MyChart is used to connect with patients for Virtual Visits (Telemedicine).  Patients are able to view lab/test results, encounter notes, upcoming appointments, etc.  Non-urgent messages can be sent to your provider as well.   To learn more about what you can do with MyChart, go to [email or web address].    Your  next appointment:   2-3  month(s)  The format for your next appointment:   In Person  Provider:   Dr. Domenic Polite    Other Instructions Thank you for choosing [facility]!

## 2020-01-21 MED ORDER — TOLTERODINE TARTRATE ER 2 MG PO CP24: 2.0000 mg | ORAL_CAPSULE | Freq: Every day | ORAL | 0 refills | Status: DC

## 2020-01-21 NOTE — Telephone Encounter (Signed)
REFILL SENT 

## 2020-01-23 ENCOUNTER — Other Ambulatory Visit: Payer: Self-pay | Admitting: Gastroenterology

## 2020-01-24 ENCOUNTER — Other Ambulatory Visit: Payer: Self-pay | Admitting: Family

## 2020-01-24 DIAGNOSIS — N393 Stress incontinence (female) (male): Secondary | ICD-10-CM

## 2020-01-31 ENCOUNTER — Other Ambulatory Visit: Payer: Self-pay | Admitting: Gastroenterology

## 2020-02-27 ENCOUNTER — Other Ambulatory Visit: Payer: Self-pay

## 2020-02-27 ENCOUNTER — Ambulatory Visit (HOSPITAL_COMMUNITY)
Admission: RE | Admit: 2020-02-27 | Discharge: 2020-02-27 | Disposition: A | Discharge status: Home / Self Care | Payer: 59 | Source: Ambulatory Visit | Attending: Cardiology | Admitting: Cardiology

## 2020-02-27 DIAGNOSIS — I428 Other cardiomyopathies: Secondary | ICD-10-CM | POA: Diagnosis present

## 2020-02-27 LAB — ECHOCARDIOGRAM COMPLETE
Area-P 1/2: 3.76 cm2
S' Lateral: 2.95 cm

## 2020-02-27 NOTE — Progress Notes (Signed)
*  PRELIMINARY RESULTS* Echocardiogram 2D Echocardiogram has been performed.  Sherry Bender 02/27/2020, 1:40 PM

## 2020-03-02 ENCOUNTER — Ambulatory Visit: Payer: 59 | Admitting: Student

## 2020-03-02 ENCOUNTER — Other Ambulatory Visit: Payer: Self-pay | Admitting: Family

## 2020-03-02 DIAGNOSIS — N393 Stress incontinence (female) (male): Secondary | ICD-10-CM

## 2020-03-02 NOTE — Telephone Encounter (Signed)
[facility]. NTBS 30 days given 01/21/20

## 2020-03-10 ENCOUNTER — Ambulatory Visit: Payer: 59 | Admitting: Gastroenterology

## 2020-03-16 ENCOUNTER — Ambulatory Visit (INDEPENDENT_AMBULATORY_CARE_PROVIDER_SITE_OTHER): Payer: 59 | Admitting: Family

## 2020-03-16 ENCOUNTER — Encounter: Payer: Self-pay | Admitting: Family

## 2020-03-16 VITALS — BP 116/60

## 2020-03-16 DIAGNOSIS — Z1211 Encounter for screening for malignant neoplasm of colon: Secondary | ICD-10-CM

## 2020-03-16 DIAGNOSIS — D509 Iron deficiency anemia, unspecified: Secondary | ICD-10-CM

## 2020-03-16 DIAGNOSIS — Z7189 Other specified counseling: Secondary | ICD-10-CM

## 2020-03-16 DIAGNOSIS — N393 Stress incontinence (female) (male): Secondary | ICD-10-CM

## 2020-03-16 DIAGNOSIS — F411 Generalized anxiety disorder: Secondary | ICD-10-CM

## 2020-03-16 DIAGNOSIS — K219 Gastro-esophageal reflux disease without esophagitis: Secondary | ICD-10-CM

## 2020-03-16 DIAGNOSIS — I2510 Atherosclerotic heart disease of native coronary artery without angina pectoris: Secondary | ICD-10-CM

## 2020-03-16 DIAGNOSIS — I5021 Acute systolic (congestive) heart failure: Secondary | ICD-10-CM

## 2020-03-16 DIAGNOSIS — E785 Hyperlipidemia, unspecified: Secondary | ICD-10-CM

## 2020-03-16 DIAGNOSIS — I10 Essential (primary) hypertension: Secondary | ICD-10-CM | POA: Diagnosis not present

## 2020-03-16 DIAGNOSIS — Z981 Arthrodesis status: Secondary | ICD-10-CM

## 2020-03-16 MED ORDER — TOLTERODINE TARTRATE ER 2 MG PO CP24: 2.0000 mg | ORAL_CAPSULE | Freq: Every day | ORAL | 3 refills | Status: DC

## 2020-03-16 NOTE — Progress Notes (Signed)
Virtual Visit via telephone Note Due to COVID-19 pandemic this visit was conducted virtually. This visit type was conducted due to national recommendations for restrictions regarding the COVID-19 Pandemic (e.g. social distancing, sheltering in place) in an effort to limit this patient's exposure and mitigate transmission in our community. All issues noted in this document were discussed and addressed.  A physical exam was not performed with this format.  I connected with Sherry Bender on 03/16/20 at 12:37 pm  by telephone  And video and verified that I am speaking with the correct person using two identifiers. Sherry Bender is currently located at home and no one is currently with her during visit. The provider, Evelina Dun, FNP is located in their office at time of visit.  I discussed the limitations, risks, security and privacy concerns of performing an evaluation and management service by telephone and the availability of in person appointments. I also discussed with the patient that there may be a patient responsible charge related to this service. The patient expressed understanding and agreed to proceed.   History and Present Illness:  Pt call the office today for  chronic follow up. Pt is followed by Oncologists annually for hx Breast cancer. Hypertension This is a chronic problem. The current episode started more than 1 year ago. The problem has been resolved since onset. Associated symptoms include anxiety and malaise/fatigue. Pertinent negatives include no peripheral edema or shortness of breath. Risk factors for coronary artery disease include dyslipidemia and obesity. The current treatment provides moderate improvement.  Gastroesophageal Reflux She complains of belching and heartburn. This is a chronic problem. The current episode started more than 1 year ago. The problem occurs occasionally. The problem has been waxing and waning. She has tried a PPI for the symptoms. The treatment  provided moderate relief.  Arthritis Presents for follow-up visit. She complains of pain and stiffness. The symptoms have been stable. Her pain is at a severity of 6/10.  Anemia Symptoms include malaise/fatigue.  Hyperlipidemia This is a chronic problem. The problem is uncontrolled. Recent lipid tests were reviewed and are high. Pertinent negatives include no shortness of breath. Current antihyperlipidemic treatment includes statins. The current treatment provides moderate improvement of lipids. Risk factors for coronary artery disease include dyslipidemia, hypertension, a sedentary lifestyle and post-menopausal.  Anxiety Presents for follow-up visit. Symptoms include excessive worry, irritability and restlessness. Patient reports no shortness of breath.   Her past medical history is significant for anemia.      Review of Systems  Constitutional: Positive for irritability and malaise/fatigue.  Respiratory: Negative for shortness of breath.   Gastrointestinal: Positive for heartburn.  Musculoskeletal: Positive for arthritis and stiffness.     Observations/Objective: No SOB or distress   Assessment and Plan: Sherry Bender comes in today with chief complaint of No chief complaint on file.   Diagnosis and orders addressed:  1. Acute systolic heart failure (HCC) - CMP14+EGFR; Future - CBC with Differential/Platelet; Future  2. Essential hypertension - CMP14+EGFR; Future - CBC with Differential/Platelet; Future  3. Gastroesophageal reflux disease, unspecified whether esophagitis present - CMP14+EGFR; Future - CBC with Differential/Platelet; Future  4. Generalized anxiety disorder - CMP14+EGFR; Future - CBC with Differential/Platelet; Future  5. Hyperlipidemia, unspecified hyperlipidemia type - CMP14+EGFR; Future - CBC with Differential/Platelet; Future  6. S/P cervical spinal fusion - CMP14+EGFR; Future - CBC with Differential/Platelet; Future  7. Iron deficiency  anemia, unspecified iron deficiency anemia type - CMP14+EGFR; Future - CBC with Differential/Platelet; Future  8. Colon cancer  screening - Cologuard - CMP14+EGFR; Future - CBC with Differential/Platelet; Future  9. SUI (stress urinary incontinence, female) - tolterodine (DETROL LA) 2 MG 24 hr capsule; Take 1 capsule (2 mg total) by mouth daily.  Dispense: 90 capsule; Refill: 3  10. Educated about COVID-19 virus infection Pt will schedule booster   Labs pending Health Maintenance reviewed Diet and exercise encouraged  Follow up plan: 6 months       I discussed the assessment and treatment plan with the patient. The patient was provided an opportunity to ask questions and all were answered. The patient agreed with the plan and demonstrated an understanding of the instructions.   The patient was advised to call back or seek an in-person evaluation if the symptoms worsen or if the condition fails to improve as anticipated.  The above assessment and management plan was discussed with the patient. The patient verbalized understanding of and has agreed to the management plan. Patient is aware to call the clinic if symptoms persist or worsen. Patient is aware when to return to the clinic for a follow-up visit. Patient educated on when it is appropriate to go to the emergency department.   Time call ended:  12:54 pm  I provided 17 minutes of non and -face-to-face time during this encounter.    Evelina Dun, FNP

## 2020-05-03 ENCOUNTER — Telehealth: Payer: Self-pay | Admitting: Gastroenterology

## 2020-05-03 NOTE — Telephone Encounter (Signed)
Patient appointment changed. Patient informed of the plan and she agrees to this. Encouraged her to resume the PPI.

## 2020-05-03 NOTE — Telephone Encounter (Signed)
Spoke with the patient. She will not be able to keep her appointment next week. She will not have transportation. It was for a follow up of her GERD. She also is calling to discuss her symptoms. She c/o reflux that has been worsening since September. She c/o coughing up lots of phlegm, throat burns and she has reflux in the middle of the day as well. She is not taking any PPI or Carafate. But she has the magic mouthwash and just took some of that for her throat.  Please advise.

## 2020-05-03 NOTE — Telephone Encounter (Signed)
If patient is having issues with transportation we can switch visit to a my chart video visit, please encourage her to keep the appointment to discuss her symptoms and management.  Thank you

## 2020-05-11 ENCOUNTER — Telehealth: Payer: 59 | Admitting: Gastroenterology

## 2020-05-11 ENCOUNTER — Telehealth: Payer: Self-pay | Admitting: Gastroenterology

## 2020-05-11 DIAGNOSIS — Z029 Encounter for administrative examinations, unspecified: Secondary | ICD-10-CM

## 2020-05-11 NOTE — Telephone Encounter (Signed)
Pt is requesting a call back regarding her missed appt she had for today @1 :30pm, advised pt she would need to reschedule but she insists on speaking with the nurse.

## 2020-05-11 NOTE — Progress Notes (Incomplete)
Sherry Bender    [id number]    1950/11/06  Primary Care Physician:Hawks, Theador Hawthorne, FNP  Referring Physician: Sharion Balloon, [address] [place],  [place] [address]  This service was provided via  telemedicine due to [place] 19 pandemic.  I connected with@ on 05/11/20 at  1:30 PM EST by a video enabled telemedicine application and verified that I am speaking with the correct person using two identifiers.  Patient location: Home Provider location: Office   I discussed the limitations, risks, security and privacy concerns of performing an evaluation and management service by video enabled telemedicine application and the availability of in person appointments. I also discussed with the patient that there may be a patient responsible charge related to this service. The patient expressed understanding and agreed to proceed.   The persons participating in this telemedicine service were myself and the patient   Chief complaint:  ***  HPI:  69 year old female with history of [place] carcinoma s/p resection, chemoradiation here with complaints of progressive dysphagia to mostly solids and occasional liquids  S/p anterior cervical decomp/discectomy fusion involving C4-C7  Has lost significant weight, has stabilized no longer losing weight but she has not been able to gain much weight either.  She has intermittent bilateral lower abdominal pain  Her diet is restricted to mostly soft foods.  Denies any food impaction but she regurgitates food with most meals.  Has intermittent cough and mucus Intermittent cough and mucus  EGD July 14, 2016: Diffuse esophagitis in lower esophagus.  Biopsies negative for Candida, intestinal metaplasia or malignancy.  Small hiatal hernia and gastric ulcer, negative H. Pylori.  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  Other relevant medical history includes nonischemic cardiomyopathy, last LVEF 30 to 35%   Outpatient Encounter Medications as of 05/11/2020  Medication Sig  . aspirin EC 81 MG tablet Take 1 tablet (81 mg total) by mouth daily.  Marland Kitchen atorvastatin (LIPITOR) 80 MG tablet Take 80 mg by mouth daily.  . carvedilol (COREG) 3.125 MG tablet Take 0.5 tablets (1.55 mg total) by mouth in the morning and at bedtime.  . digoxin (LANOXIN) 0.125 MG tablet Take 1 tablet (125 mcg total) by mouth daily.  Marland Kitchen estradiol (ESTRACE) 1 MG tablet TAKE 1 TABLET BY MOUTH EVERY DAY (Patient taking differently: Take 1 mg by mouth every other day. )  . furosemide (LASIX) 20 MG tablet Take 20 mg by mouth daily as needed for edema.  . lidocaine (LIDODERM) 5 % Place 1 patch onto the skin daily. (Patient taking differently: Place 1 patch onto the skin daily as needed (pain). )  . Lidocaine 4 % PTCH Apply 1 patch topically daily.  . pantoprazole (PROTONIX) 40 MG tablet TAKE 1 TABLET BY MOUTH EVERY DAY  . Propylene Glycol (SYSTANE BALANCE) 0.6 % SOLN Place 1 drop into both eyes daily as needed (dry eyes).  . sacubitril-valsartan (ENTRESTO) 24-26 MG Take 1 tablet by mouth 2 (two) times daily. Due for follow up appt  . sucralfate (CARAFATE) 1 g tablet Take 1 tablet (1 g total) by mouth 2 (two) times daily. (Patient not taking: Reported on 01/20/2020)  . tolterodine (DETROL LA) 2 MG 24 hr capsule Take 1 capsule (2 mg total) by mouth daily.   No facility-administered encounter medications on  file as of 05/11/2020.    Allergies as of 05/11/2020 - Review Complete 03/16/2020  Allergen Reaction Noted  . Codeine Nausea And Vomiting 09/26/2010  . Spironolactone  07/15/2019    Past Medical History:  Diagnosis Date  . Anxiety   . Arthritis   . Breast cancer ([facility]) 2016  . Cardiomyopathy ([facility])    a. EF 25-30% by echocardiogram in 02/2019  . Coronary  atherosclerosis    Cardiac CTA with negative FFR, nonobstructive March 2021  . Cricopharyngeus muscle dysfunction   . Gastric ulcer   . GERD (gastroesophageal reflux disease)   . Hiatal hernia   . Hyperlipidemia   . Hypertension   . Scoliosis   . Skin cancer   . Urinary frequency     Past Surgical History:  Procedure Laterality Date  . ABDOMINAL HYSTERECTOMY  2000   TAH/BSO  . ANTERIOR CERVICAL DECOMP/DISCECTOMY FUSION N/A 06/18/2014   Procedure: C4-5 C5-6 C6-7 ANTERIOR CERVICAL DECOMPRESSION/DISKECTOMY/FUSION;  Surgeon: Eustace Moore, MD;  Location: [facility];  Service: Neurosurgery;  Laterality: N/A;  C4-5 C5-6 C6-7 ANTERIOR CERVICAL DECOMPRESSION/DISKECTOMY/FUSION  . ANTERIOR CERVICAL DECOMP/DISCECTOMY FUSION N/A 06/25/2014   Procedure: EXPLORATION OF CERVICAL FUSION WITH REVIOSN OF HARDWARE;  Surgeon: Eustace Moore, MD;  Location: [facility];  Service: Neurosurgery;  Laterality: N/A;  . BIOPSY  01/01/2020   Procedure: BIOPSY;  Surgeon: Mauri Pole, MD;  Location: [facility];  Service: Endoscopy;;  . CERVICAL FUSION    . CHOLECYSTECTOMY  1994  . COMPLETE MASTECTOMY W/ SENTINEL NODE BIOPSY Left 04/30/2015  . ESOPHAGOGASTRODUODENOSCOPY (EGD) WITH PROPOFOL N/A 01/01/2020   Procedure: ESOPHAGOGASTRODUODENOSCOPY (EGD) WITH PROPOFOL;  Surgeon: Mauri Pole, MD;  Location: [facility];  Service: Endoscopy;  Laterality: N/A;  . FOOT NEUROMA SURGERY  01/2010   also had metatarasl shortened  . KNEE ARTHROSCOPY  2005   right knee  . MASTECTOMY Left 2016  . PORT-A-CATH REMOVAL Right 04/30/2015   Procedure: REMOVAL PORT-A-CATH;  Surgeon: Fanny Skates, MD;  Location: [facility];  Service: General;  Laterality: Right;  . PORTACATH PLACEMENT N/A 11/23/2014   Procedure: INSERTION PORT-A-CATH WITH ULTRASOUND;  Surgeon: Fanny Skates, MD;  Location: [facility];  Service: General;  Laterality: N/A;  . RHINOPLASTY    . SIMPLE MASTECTOMY WITH AXILLARY SENTINEL NODE  BIOPSY Left 04/30/2015   Procedure: LEFT TOTAL  MASTECTOMY WITH LEFT AXILLARY SENTINEL NODE BIOPSY;  Surgeon: Fanny Skates, MD;  Location: [facility];  Service: General;  Laterality: Left;    Family History  Problem Relation Age of Onset  . Heart disease Mother   . Hypertension Mother   . Diabetes Mother   . Heart disease Father   . Hypertension Father   . Cancer Maternal Grandmother        ? ovarian cancer vs. cervical  . Cancer Paternal Aunt        ? breast    Social History   Socioeconomic History  . Marital status: Married    Spouse name: Not on file  . Number of children: 2  . Years of education: Not on file  . Highest education level: Not on file  Occupational History  . Not on file  Tobacco Use  . Smoking status: Former Smoker    Packs/day: 0.25    Years: 42.00    Pack years: 10.50    Types: Cigarettes    Quit date: 01/24/2017    Years since quitting: 3.2  . Smokeless tobacco: Never Used  Vaping  Use  . Vaping Use: Never used  Substance and Sexual Activity  . Alcohol use: No  . Drug use: No  . Sexual activity: Never  Other Topics Concern  . 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     Two daughters born 47 and 55- one in [place] one in [place].             Social Determinants of Health   Financial Resource Strain:   . Difficulty of Paying Living Expenses: Not on file  Food Insecurity:   . Worried About Charity fundraiser in the Last Year: Not on file  . Ran Out of Food in the Last Year: Not on file  Transportation Needs:   . Lack of Transportation (Medical): Not on file  . Lack of Transportation (Non-Medical): Not on file  Physical Activity:   . Days of Exercise per Week: Not on file  . Minutes of Exercise per Session: Not on file  Stress:   . Feeling of Stress : Not on file  Social Connections:   .  Frequency of Communication with Friends and Family: Not on file  . Frequency of Social Gatherings with Friends and Family: Not on file  . Attends Religious Services: Not on file  . Active Member of Clubs or Organizations: Not on file  . Attends Archivist Meetings: Not on file  . Marital Status: Not on file  Intimate Partner Violence:   . Fear of Current or Ex-Partner: Not on file  . Emotionally Abused: Not on file  . Physically Abused: Not on file  . Sexually Abused: Not on file      Review of systems: Review of Systems as per HPI All other systems reviewed and are negative.   Observations/Objective:   Data Reviewed:  Reviewed labs, radiology imaging, old records and pertinent past GI work up   Assessment and Plan/Recommendations:  ***     I discussed the assessment and treatment plan with the patient. The patient was provided an opportunity to ask questions and all were answered. The patient agreed with the plan and demonstrated an understanding of the instructions.   The patient was advised to call back or seek an in-person evaluation if the symptoms worsen or if the condition fails to improve as anticipated.  I provided *** minutes of non-face-to-face time during this encounter.   Harl Bowie, MD   CC: Sharion Balloon, FNP

## 2020-05-12 ENCOUNTER — Telehealth: Payer: Self-pay | Admitting: Student

## 2020-05-12 ENCOUNTER — Telehealth: Payer: Self-pay

## 2020-05-12 NOTE — Telephone Encounter (Signed)
Appt made

## 2020-05-12 NOTE — Telephone Encounter (Signed)
Returned the call. The patient states she is regurgitating her food and refluxing despite the medications.  I encouraged her to let me make her an appointment for evaluation. She declines. Offered that she could schedule with her pcp for the initial evaluation if she is unable to come see [place].  She states she may do that.

## 2020-05-12 NOTE — Telephone Encounter (Signed)
Spoke with pt. Encouraged to call GI regarding GERD. Pt voiced that her PCP offered her an appt. But she stated she did not have a ride.

## 2020-05-12 NOTE — Telephone Encounter (Signed)
New message    Patient is having really bad GERD and she cant get ahold of her PCP , is there anything [place] can do ?

## 2020-05-12 NOTE — Telephone Encounter (Signed)
Patient aware and verbalized understanding. °

## 2020-05-17 ENCOUNTER — Ambulatory Visit (INDEPENDENT_AMBULATORY_CARE_PROVIDER_SITE_OTHER): Payer: 59 | Admitting: Family

## 2020-05-17 ENCOUNTER — Ambulatory Visit: Payer: 59 | Admitting: Nurse Practitioner

## 2020-05-17 ENCOUNTER — Other Ambulatory Visit: Payer: Self-pay | Admitting: Family

## 2020-05-17 ENCOUNTER — Encounter: Payer: Self-pay | Admitting: Family

## 2020-05-17 DIAGNOSIS — K219 Gastro-esophageal reflux disease without esophagitis: Secondary | ICD-10-CM

## 2020-05-17 DIAGNOSIS — R1319 Other dysphagia: Secondary | ICD-10-CM

## 2020-05-17 MED ORDER — DEXILANT 60 MG PO CPDR: 60.0000 mg | DELAYED_RELEASE_CAPSULE | Freq: Every day | ORAL | 1 refills | Status: DC

## 2020-05-17 MED ORDER — MAGIC MOUTHWASH: 10.0000 mL | Freq: Four times a day (QID) | ORAL | 2 refills | Status: DC | PRN

## 2020-05-17 NOTE — Telephone Encounter (Signed)
Pharmacy comment:  Alternative Requested:NOT ON INSURANCE FORMULARY- NOT COVERED.   All Pharmacy Suggested Alternatives:   0 pantoprazole (PROTONIX) 40 MG tablet 0 RABEprazole (ACIPHEX) 20 MG tablet 0 omeprazole (PRILOSEC) 20 MG capsule 0 esomeprazole (NEXIUM) 20 MG capsule 0 lansoprazole (PREVACID) 30 MG capsule

## 2020-05-17 NOTE — Progress Notes (Signed)
   Virtual Visit via telephone Note Due to COVID-19 pandemic this visit was conducted virtually. This visit type was conducted due to national recommendations for restrictions regarding the COVID-19 Pandemic (e.g. social distancing, sheltering in place) in an effort to limit this patient's exposure and mitigate transmission in our community. All issues noted in this document were discussed and addressed.  A physical exam was not performed with this format.  I connected with Sherry Bender on 05/17/20 at 1:23 pm by telephone and verified that I am speaking with the correct person using two identifiers. Sherry Bender is currently located at home and no one is currently with her during visit. The provider, Evelina Dun, FNP is located in their office at time of visit.  I discussed the limitations, risks, security and privacy concerns of performing an evaluation and management service by telephone and the availability of in person appointments. I also discussed with the patient that there may be a patient responsible charge related to this service. The patient expressed understanding and agreed to proceed.   History and Present Illness:  Pt calls the office today with worsening GERD symptoms. She has GI provider, but was told to follow up US. She has not taken protonix caused her to have a headache and cough worse. She also has not been taking her Carafate, because it has not been working.  Gastroesophageal Reflux She complains of belching, coughing, dysphagia, early satiety, globus sensation, heartburn and wheezing. This is a chronic problem. The current episode started more than 1 year ago. The problem has been gradually worsening. The symptoms are aggravated by certain foods and lying down. Risk factors include obesity. She has tried an antacid for the symptoms. The treatment provided mild relief.      Review of Systems  Respiratory: Positive for cough and wheezing.   Gastrointestinal: Positive  for dysphagia and heartburn.  All other systems reviewed and are negative.    Observations/Objective: No SOB or distress noted   Assessment and Plan: 1. Gastroesophageal reflux disease, unspecified whether esophagitis present -Diet discussed- Avoid fried, spicy, citrus foods, caffeine and alcohol -Do not eat 2-3 hours before bedtime -Encouraged small frequent meals -Avoid NSAID's - - dexlansoprazole (DEXILANT) 60 MG capsule; Take 1 capsule (60 mg total) by mouth daily.  Dispense: 90 capsule; Refill: 1  2. Esophageal dysphagia      I discussed the assessment and treatment plan with the patient. The patient was provided an opportunity to ask questions and all were answered. The patient agreed with the plan and demonstrated an understanding of the instructions.   The patient was advised to call back or seek an in-person evaluation if the symptoms worsen or if the condition fails to improve as anticipated.  The above assessment and management plan was discussed with the patient. The patient verbalized understanding of and has agreed to the management plan. Patient is aware to call the clinic if symptoms persist or worsen. Patient is aware when to return to the clinic for a follow-up visit. Patient educated on when it is appropriate to go to the emergency department.   Time call ended:  1:37 pm   I provided 14 minutes of non-face-to-face time during this encounter.    Evelina Dun, FNP

## 2020-05-18 ENCOUNTER — Other Ambulatory Visit: Payer: Self-pay | Admitting: Family

## 2020-05-18 MED ORDER — ESOMEPRAZOLE MAGNESIUM 40 MG PO CPDR
40.0000 mg | DELAYED_RELEASE_CAPSULE | Freq: Every day | ORAL | 4 refills | Status: DC
Start: 2020-05-18 — End: 2021-08-13

## 2020-05-18 NOTE — Progress Notes (Signed)
Insurance denied Dexilant, nexium 40 mg Prescription sent to pharmacy

## 2020-05-18 NOTE — Progress Notes (Signed)
Patient aware.

## 2020-05-24 DIAGNOSIS — Z0289 Encounter for other administrative examinations: Secondary | ICD-10-CM

## 2020-05-30 ENCOUNTER — Other Ambulatory Visit: Payer: Self-pay | Admitting: Cardiology

## 2020-06-20 ENCOUNTER — Emergency Department (HOSPITAL_COMMUNITY): Payer: 59

## 2020-06-20 ENCOUNTER — Other Ambulatory Visit: Payer: Self-pay

## 2020-06-20 ENCOUNTER — Encounter (HOSPITAL_COMMUNITY): Payer: Self-pay | Admitting: Emergency Medicine

## 2020-06-20 ENCOUNTER — Emergency Department (HOSPITAL_COMMUNITY)
Admission: EM | Admit: 2020-06-20 | Discharge: 2020-06-20 | Disposition: A | Discharge status: Home / Self Care | Payer: 59 | Attending: Emergency Medicine | Admitting: Emergency Medicine

## 2020-06-20 DIAGNOSIS — Z87891 Personal history of nicotine dependence: Secondary | ICD-10-CM | POA: Diagnosis not present

## 2020-06-20 DIAGNOSIS — Z79899 Other long term (current) drug therapy: Secondary | ICD-10-CM | POA: Insufficient documentation

## 2020-06-20 DIAGNOSIS — Z7982 Long term (current) use of aspirin: Secondary | ICD-10-CM | POA: Diagnosis not present

## 2020-06-20 DIAGNOSIS — I110 Hypertensive heart disease with heart failure: Secondary | ICD-10-CM | POA: Insufficient documentation

## 2020-06-20 DIAGNOSIS — W010XXA Fall on same level from slipping, tripping and stumbling without subsequent striking against object, initial encounter: Secondary | ICD-10-CM | POA: Diagnosis not present

## 2020-06-20 DIAGNOSIS — I5021 Acute systolic (congestive) heart failure: Secondary | ICD-10-CM | POA: Insufficient documentation

## 2020-06-20 DIAGNOSIS — R233 Spontaneous ecchymoses: Secondary | ICD-10-CM | POA: Diagnosis not present

## 2020-06-20 DIAGNOSIS — Z85828 Personal history of other malignant neoplasm of skin: Secondary | ICD-10-CM | POA: Diagnosis not present

## 2020-06-20 DIAGNOSIS — S42292A Other displaced fracture of upper end of left humerus, initial encounter for closed fracture: Secondary | ICD-10-CM | POA: Diagnosis not present

## 2020-06-20 DIAGNOSIS — S4992XA Unspecified injury of left shoulder and upper arm, initial encounter: Secondary | ICD-10-CM | POA: Diagnosis present

## 2020-06-20 MED ORDER — HYDROCODONE-ACETAMINOPHEN 5-325 MG PO TABS: 2.0000 | ORAL_TABLET | ORAL | 0 refills | Status: DC | PRN

## 2020-06-20 MED ORDER — ACETAMINOPHEN 325 MG PO TABS
650.0000 mg | ORAL_TABLET | Freq: Once | ORAL | Status: AC
  Administered 2020-06-20: 650 mg via ORAL
  Filled 2020-06-20: qty 2

## 2020-06-20 MED ORDER — FENTANYL CITRATE (PF) 100 MCG/2ML IJ SOLN: 25.0000 ug | Freq: Once | INTRAMUSCULAR | Status: DC

## 2020-06-20 MED ORDER — FENTANYL CITRATE (PF) 100 MCG/2ML IJ SOLN
50.0000 ug | Freq: Once | INTRAMUSCULAR | Status: AC
  Administered 2020-06-20: 50 ug via INTRAMUSCULAR
  Filled 2020-06-20: qty 2

## 2020-06-20 NOTE — Discharge Instructions (Addendum)
Please wear your sling at all times until you follow-up with orthopedics.  Please call Dr. Cathrine Muster office tomorrow to schedule an appointment.  Please take Tylenol for pain, you may take the prescribed Norco if your pain is poorly controlled.  Return to the ER for any new or worsening symptoms.

## 2020-06-20 NOTE — ED Triage Notes (Signed)
Pt states she fell on carpet a week ago.  Pt c/o left shoulder and arm pain.  Pt denies blood thinners, LOC, but did hit her head.

## 2020-06-20 NOTE — ED Provider Notes (Signed)
[facility] EMERGENCY DEPARTMENT Provider Note   CSN: [id number] Arrival date & time: 06/20/20  1219     History Chief Complaint  Patient presents with  . Fall    Sherry Bender is a 70 y.o. female.  HPI 70 year old female with a history of anxiety, arthritis, breast cancer, cardiomyopathy, GERD, hyperlipidemia, hypertension, scoliosis presents to the ER with complaints of left arm pain after fall which occurred approximately week ago.  Patient states that she tripped over her own shoes and fell forward onto carpet.  States she may have hit her head, but did not lose consciousness.  She is not on blood thinners.  Had difficulty moving her left arm to the point that her husband has had to help her get dressed.  Denies any numbness or tingling.  She has noted some bruising to the inner side of her left elbow.    Past Medical History:  Diagnosis Date  . Anxiety   . Arthritis   . Breast cancer (HCC) 2016  . Cardiomyopathy (HCC)    a. EF 25-30% by echocardiogram in 02/2019  . Coronary atherosclerosis    Cardiac CTA with negative FFR, nonobstructive March 2021  . Cricopharyngeus muscle dysfunction   . Gastric ulcer   . GERD (gastroesophageal reflux disease)   . Hiatal hernia   . Hyperlipidemia   . Hypertension   . Scoliosis   . Skin cancer   . Urinary frequency     Patient Active Problem List   Diagnosis Date Noted  . Acute gastric ulcer without hemorrhage or perforation   . Gastritis and gastroduodenitis   . Acute systolic heart failure (HCC)   . Acute respiratory failure (HCC) 02/18/2019  . Impingement syndrome of left shoulder 01/02/2019  . Closed fracture of head of left humerus 06/01/2017  . Underweight 12/25/2016  . Loss of weight 07/03/2016  . History of esophageal stricture 07/03/2016  . Dysphagia 06/08/2016  . Hypersensitivity reaction 12/02/2015  . Iron deficiency anemia 11/24/2015  . Breast cancer of upper-outer quadrant of left female breast (HCC) 11/11/2014   . S/P cervical spinal fusion 06/24/2014  . HTN (hypertension) 12/06/2010  . Hyperlipidemia 09/26/2010  . Nodular goiter 09/26/2010  . GERD (gastroesophageal reflux disease) 09/26/2010  . Degenerative disc disease 09/26/2010  . Allergic rhinitis 09/26/2010  . Anxiety disorder 09/26/2010  . Cyst, breast 09/26/2010    Past Surgical History:  Procedure Laterality Date  . ABDOMINAL HYSTERECTOMY  2000   TAH/BSO  . ANTERIOR CERVICAL DECOMP/DISCECTOMY FUSION N/A 06/18/2014   Procedure: C4-5 C5-6 C6-7 ANTERIOR CERVICAL DECOMPRESSION/DISKECTOMY/FUSION;  Surgeon: Tia Alert, MD;  Location: [facility];  Service: Neurosurgery;  Laterality: N/A;  C4-5 C5-6 C6-7 ANTERIOR CERVICAL DECOMPRESSION/DISKECTOMY/FUSION  . ANTERIOR CERVICAL DECOMP/DISCECTOMY FUSION N/A 06/25/2014   Procedure: EXPLORATION OF CERVICAL FUSION WITH REVIOSN OF HARDWARE;  Surgeon: Tia Alert, MD;  Location: [facility];  Service: Neurosurgery;  Laterality: N/A;  . BIOPSY  01/01/2020   Procedure: BIOPSY;  Surgeon: Napoleon Form, MD;  Location: [facility];  Service: Endoscopy;;  . CERVICAL FUSION    . CHOLECYSTECTOMY  1994  . COMPLETE MASTECTOMY W/ SENTINEL NODE BIOPSY Left 04/30/2015  . ESOPHAGOGASTRODUODENOSCOPY (EGD) WITH PROPOFOL N/A 01/01/2020   Procedure: ESOPHAGOGASTRODUODENOSCOPY (EGD) WITH PROPOFOL;  Surgeon: Napoleon Form, MD;  Location: [facility];  Service: Endoscopy;  Laterality: N/A;  . FOOT NEUROMA SURGERY  01/2010   also had metatarasl shortened  . KNEE ARTHROSCOPY  2005   right knee  . MASTECTOMY Left  2016  . PORT-A-CATH REMOVAL Right 04/30/2015   Procedure: REMOVAL PORT-A-CATH;  Surgeon: Fanny Skates, MD;  Location: [place];  Service: General;  Laterality: Right;  . PORTACATH PLACEMENT N/A 11/23/2014   Procedure: INSERTION PORT-A-CATH WITH ULTRASOUND;  Surgeon: Fanny Skates, MD;  Location: [place];  Service: General;  Laterality: N/A;  . RHINOPLASTY    . SIMPLE MASTECTOMY  WITH AXILLARY SENTINEL NODE BIOPSY Left 04/30/2015   Procedure: LEFT TOTAL  MASTECTOMY WITH LEFT AXILLARY SENTINEL NODE BIOPSY;  Surgeon: Fanny Skates, MD;  Location: [place];  Service: General;  Laterality: Left;     OB History   No obstetric history on file.     Family History  Problem Relation Age of Onset  . Heart disease Mother   . Hypertension Mother   . Diabetes Mother   . Heart disease Father   . Hypertension Father   . Cancer Maternal Grandmother        ? ovarian cancer vs. cervical  . Cancer Paternal Aunt        ? breast    Social History   Tobacco Use  . Smoking status: Former Smoker    Packs/day: 0.25    Years: 42.00    Pack years: 10.50    Types: Cigarettes    Quit date: 01/24/2017    Years since quitting: 3.4  . Smokeless tobacco: Never Used  Vaping Use  . Vaping Use: Never used  Substance Use Topics  . Alcohol use: No  . Drug use: No    Home Medications Prior to Admission medications   Medication Sig Start Date End Date Taking? Authorizing Provider  HYDROcodone-acetaminophen (NORCO/VICODIN) 5-325 MG tablet Take 2 tablets by mouth every 4 (four) hours as needed for up to 3 days. 06/20/20 06/23/20 Yes Garald Balding, PA-C  aspirin EC 81 MG tablet Take 1 tablet (81 mg total) by mouth daily. 09/12/19   Satira Sark, MD  atorvastatin (LIPITOR) 80 MG tablet Take 80 mg by mouth daily.    [provider]  carvedilol (COREG) 3.125 MG tablet Take 0.5 tablets (1.55 mg total) by mouth in the morning and at bedtime. 10/31/19   Nicholas Lose, MD  digoxin (LANOXIN) 0.125 MG tablet Take 1 tablet (125 mcg total) by mouth daily. 01/20/20   Strader, Marye Round M, PA-C  ENTRESTO 24-26 MG TAKE 1 TABLET BY MOUTH 2 (TWO) TIMES DAILY. DUE FOR FOLLOW UP APPT 05/31/20   Arnoldo Lenis, MD  esomeprazole (NEXIUM) 40 MG capsule Take 1 capsule (40 mg total) by mouth daily. 05/18/20 05/18/21  Evelina Dun A, FNP  estradiol (ESTRACE) 1 MG tablet TAKE 1 TABLET BY MOUTH EVERY  DAY Patient taking differently: Take 1 mg by mouth every other day.  08/14/19   Dettinger, Fransisca Kaufmann, MD  furosemide (LASIX) 20 MG tablet Take 20 mg by mouth daily as needed for edema.    [provider]  lidocaine (LIDODERM) 5 % Place 1 patch onto the skin daily. Patient taking differently: Place 1 patch onto the skin daily as needed (pain).  12/26/18   Evelina Dun A, FNP  Lidocaine 4 % PTCH Apply 1 patch topically daily.    [provider]  magic mouthwash SOLN Take 10 mLs by mouth 4 (four) times daily as needed for mouth pain. 05/17/20   Sharion Balloon, FNP  Propylene Glycol (SYSTANE BALANCE) 0.6 % SOLN Place 1 drop into both eyes daily as needed (dry eyes).    [provider]  tolterodine (DETROL LA) 2 MG 24 hr capsule Take 1 capsule (2 mg total) by mouth daily. 03/16/20   Sharion Balloon, FNP    Allergies    Codeine and Spironolactone  Review of Systems   Review of Systems  Constitutional: Negative for chills and fever.  HENT: Negative for ear pain and sore throat.   Eyes: Negative for pain and visual disturbance.  Respiratory: Negative for cough and shortness of breath.   Cardiovascular: Negative for chest pain and palpitations.  Gastrointestinal: Negative for abdominal pain and vomiting.  Genitourinary: Negative for dysuria and hematuria.  Musculoskeletal: Positive for arthralgias. Negative for back pain.  Skin: Negative for color change and rash.  Neurological: Negative for seizures and syncope.  All other systems reviewed and are negative.   Physical Exam Updated Vital Signs BP 137/72   Pulse 86   Temp 98 F (36.7 C) (Oral)   Resp 18   Ht 5\' 2"  (1.575 m)   Wt 41.3 kg   LMP 02/28/1999   SpO2 98%   BMI 16.64 kg/m   Physical Exam Vitals reviewed.  Constitutional:      General: She is not in acute distress.    Appearance: Normal appearance. She is not ill-appearing, toxic-appearing or diaphoretic.  HENT:     Head: Normocephalic and  atraumatic.     Mouth/Throat:     Mouth: Mucous membranes are moist.     Pharynx: Oropharynx is clear.  Eyes:     General:        Right eye: No discharge.        Left eye: No discharge.     Extraocular Movements: Extraocular movements intact.     Conjunctiva/sclera: Conjunctivae normal.  Cardiovascular:     Rate and Rhythm: Normal rate.     Pulses: Normal pulses.     Heart sounds: Normal heart sounds.  Pulmonary:     Effort: Pulmonary effort is normal.     Breath sounds: Normal breath sounds.  Abdominal:     General: Abdomen is flat.     Tenderness: There is no abdominal tenderness.  Musculoskeletal:        General: Swelling and tenderness present. Normal range of motion.     Right lower leg: No edema.     Left lower leg: No edema.     Comments: Very limited range of motion of the left shoulder and left elbow secondary to pain.  Visible step-offs or crepitus.  Some bruising to the medial aspect of the left elbow.  2+ radial pulses bilaterally.  Sensations intact.  Grip strength intact.  No midline tenderness to the C, T, L-spine, moving right upper extremity and lower extremities without difficulty.  Ambulated without difficulty.  Skin:    Findings: Bruising present.  Neurological:     General: No focal deficit present.     Mental Status: She is alert and oriented to person, place, and time.     Sensory: No sensory deficit.     Motor: No weakness.  Psychiatric:        Mood and Affect: Mood normal.        Behavior: Behavior normal.     ED Results / Procedures / Treatments   Labs (all labs ordered are listed, but only abnormal results are displayed) Labs Reviewed - No data to display  EKG None  Radiology DG Forearm Left  Result Date: 06/20/2020 CLINICAL DATA:  Status post fall 1 week ago with left arm pain. EXAM:  LEFT FOREARM - 2 VIEW COMPARISON:  None. FINDINGS: There is no evidence of fracture or dislocation. Marked osteopenia is identified. Soft tissues are  unremarkable. IMPRESSION: No acute fracture or dislocation. Electronically Signed   By: Abelardo Diesel M.D.   On: 06/20/2020 14:06   CT Shoulder Left Wo Contrast  Result Date: 06/20/2020 CLINICAL DATA:  [place].  Fractured humeral head and neck. EXAM: CT OF THE UPPER LEFT EXTREMITY WITHOUT CONTRAST TECHNIQUE: Multidetector CT imaging of the upper left extremity was performed according to the standard protocol. COMPARISON:  Radiographs, same date. FINDINGS: There is a displaced transverse fracture through the humeral neck. The humeral head is rotated posteriorly and moderately impacted. Displacement estimated at 2 cm. There is also a nondisplaced fracture through the lesser tuberosity. The greater tuberosity is intact. The [facility] joint is intact. No distal clavicle fracture. No scapular fractures. The glenoid is normal. The glenohumeral joint is maintained. Grossly by CT the rotator cuff tendons are intact but there is severe fatty atrophy of the rotator cuff muscles. The visualized left ribs are grossly intact. The visualized left lung is clear. IMPRESSION: 1. Displaced transverse fracture through the humeral neck. The humeral head is rotated posteriorly and moderately impacted. 2. Nondisplaced fracture through the lesser tuberosity. 3. Grossly by CT the rotator cuff tendons are intact but there is severe fatty atrophy of the rotator cuff muscles. Electronically Signed   By: Marijo Sanes M.D.   On: 06/20/2020 15:18   DG Humerus Left  Result Date: 06/20/2020 CLINICAL DATA:  Status post fall 1 week ago with left arm pain. EXAM: LEFT HUMERUS - 2+ VIEW COMPARISON:  None. FINDINGS: Comminuted displaced fracture of the left humeral neck/proximal shaft are noted. IMPRESSION: Comminuted displaced fracture of the left humeral neck/proximal shaft. Electronically Signed   By: Abelardo Diesel M.D.   On: 06/20/2020 14:07    Procedures Procedures (including critical care time)  Medications Ordered in ED Medications   acetaminophen (TYLENOL) tablet 650 mg (650 mg Oral Given 06/20/20 1449)  fentaNYL (SUBLIMAZE) injection 50 mcg (50 mcg Intramuscular Given 06/20/20 1448)    ED Course  I have reviewed the triage vital signs and the nursing notes.  Pertinent labs & imaging results that were available during my care of the patient were reviewed by me and considered in my medical decision making (see chart for details).    MDM Rules/Calculators/A&P                         70 year old female who presents to the ER for evaluation after a fall.  Complains of left arm pain.  Visible bruising on the medial aspect of the left elbow and forearm.  Exam is limited in range of motion secondary to pain of the shoulder and elbow.  She is neurovascularly intact.  Patient is not on any blood thinners.  Did not lose consciousness during fall, patient is a week out from the fall, do not think she needs any CT of the head or neck.  She has no midline tenderness to the C, T-spine or L-spine  Plain films of the left forearm no acute fracture dislocation.  Plain films of the left shoulder with a comminuted placed fracture of the left humeral neck/shaft.  Spoke with Dr. Amedeo Kinsman with orthopedics.  He recommends CT of the left shoulder and following up in the office.  Patient was placed in a sling.  Patient's pain was treated with fentanyl, Tylenol here in the  ED, will send home with short course of Norco.  Referral to Dr. Gerrit Heck office provided.  Stressed follow-up.  Return precautions discussed.  She voiced understanding and is agreeable.  This was a shared visit with my supervising physician Dr. Roderic Palau who independently saw and evaluated the patient & provided guidance in evaluation/management/disposition ,in agreement with care   Final Clinical Impression(s) / ED Diagnoses Final diagnoses:  Humeral head fracture, left, closed, initial encounter    Rx / DC Orders ED Discharge Orders         Ordered    HYDROcodone-acetaminophen  (NORCO/VICODIN) 5-325 MG tablet  Every 4 hours PRN        06/20/20 1524           Lyndel Safe 06/20/20 1529    Milton Ferguson, MD 06/24/20 1036

## 2020-06-20 NOTE — ED Notes (Signed)
Pt fell one week ago   Here for eval or left arm pain

## 2020-06-22 ENCOUNTER — Other Ambulatory Visit: Payer: Self-pay

## 2020-06-22 ENCOUNTER — Ambulatory Visit (INDEPENDENT_AMBULATORY_CARE_PROVIDER_SITE_OTHER): Payer: 59 | Admitting: Orthopedic Surgery

## 2020-06-22 ENCOUNTER — Encounter: Payer: Self-pay | Admitting: Orthopedic Surgery

## 2020-06-22 VITALS — BP 127/55 | HR 87 | Ht 62.0 in | Wt 91.0 lb

## 2020-06-22 DIAGNOSIS — S42292A Other displaced fracture of upper end of left humerus, initial encounter for closed fracture: Secondary | ICD-10-CM | POA: Diagnosis not present

## 2020-06-22 MED ORDER — HYDROMORPHONE HCL 2 MG PO TABS
1.0000 mg | ORAL_TABLET | Freq: Four times a day (QID) | ORAL | 0 refills | Status: AC | PRN
Start: 2020-06-22 — End: 2020-06-29

## 2020-06-22 NOTE — H&P (View-Only) (Signed)
New Patient Visit  Assessment: Netanya Yazdani is a 70 y.o. RHD female with the following: Left proximal humerus fracture   Plan: Mrs. Tomson sustained a displaced left proximal humerus fracture.  The CT scan demonstrates that the humeral head proximal to the humeral shaft.  In addition, she has previously developed some rotator cuff arthropathy.  As result, I think she would be an excellent candidate for reverse shoulder arthroplasty.  This was discussed with the patient.  Risks and benefits of the surgery, including, but not limited to infection, bleeding, persistent pain, need for further surgery, non-union, malunion and more severe complications associated with anesthesia were discussed with the patient.  The patient has elected to proceed.  The patient is at an increased risk for persistent pain and stiffness, as well as general complications associated with anesthesia due to associated medical comorbidities including CHF.  Surgical Plan  Procedure: Left reverse shoulder arthroplasty Disposition: Observation Anesthesia: General, with a block Medical Comorbidities: History of breast cancer, CHF, cardiomyopathy  Follow-up: Return for After surgery; DOS July 01, 2020.  Subjective:  Chief Complaint  Patient presents with  . Left shoulder pain    History of Present Illness: Jaziya Obarr is a 70 y.o. RHD female who presents for evaluation of her left shoulder.  She sustained a mechanical fall approximately 10 days ago, but was not seen in the emergency department for 1 week after the injury.  Initially, she thought the pain would improve.  However, her pain continued and she subsequently went to the emergency department.  Of note, she was previously treated for a left proximal humerus fracture without surgery.  Her pain improved, but she did not regain her mobility and range of motion.  She was provided with some Norco in the emergency department, but this is causing her nausea.  She  has had some difficulty sleeping at night due to the pain.  She states the pain is worse when she wears her sling.  She does not have any numbness or tingling distally.   Review of Systems: No fevers or chills No numbness or tingling No chest pain No shortness of breath No bowel or bladder dysfunction No GI distress No headaches +nausea   Medical History:  Past Medical History:  Diagnosis Date  . Anxiety   . Arthritis   . Breast cancer (HCC) 2016  . Cardiomyopathy (HCC)    a. EF 25-30% by echocardiogram in 02/2019  . Coronary atherosclerosis    Cardiac CTA with negative FFR, nonobstructive March 2021  . Cricopharyngeus muscle dysfunction   . Gastric ulcer   . GERD (gastroesophageal reflux disease)   . Hiatal hernia   . Hyperlipidemia   . Hypertension   . Scoliosis   . Skin cancer   . Urinary frequency     Past Surgical History:  Procedure Laterality Date  . ABDOMINAL HYSTERECTOMY  2000   TAH/BSO  . ANTERIOR CERVICAL DECOMP/DISCECTOMY FUSION N/A 06/18/2014   Procedure: C4-5 C5-6 C6-7 ANTERIOR CERVICAL DECOMPRESSION/DISKECTOMY/FUSION;  Surgeon: Tia Alert, MD;  Location: [facility];  Service: Neurosurgery;  Laterality: N/A;  C4-5 C5-6 C6-7 ANTERIOR CERVICAL DECOMPRESSION/DISKECTOMY/FUSION  . ANTERIOR CERVICAL DECOMP/DISCECTOMY FUSION N/A 06/25/2014   Procedure: EXPLORATION OF CERVICAL FUSION WITH REVIOSN OF HARDWARE;  Surgeon: Tia Alert, MD;  [facility]: [facility];  Service: Neurosurgery;  Laterality: N/A;  . BIOPSY  01/01/2020   Procedure: BIOPSY;  Surgeon: Napoleon Form, MD;  [facility];  Service: Endoscopy;;  . CERVICAL FUSION    .  CHOLECYSTECTOMY  1994  . COMPLETE MASTECTOMY W/ SENTINEL NODE BIOPSY Left 04/30/2015  . ESOPHAGOGASTRODUODENOSCOPY (EGD) WITH PROPOFOL N/A 01/01/2020   Procedure: ESOPHAGOGASTRODUODENOSCOPY (EGD) WITH PROPOFOL;  Surgeon: Mauri Pole, MD;  [facility];  Service: Endoscopy;  Laterality: N/A;  .  FOOT NEUROMA SURGERY  01/2010   also had metatarasl shortened  . KNEE ARTHROSCOPY  2005   right knee  . MASTECTOMY Left 2016  . PORT-A-CATH REMOVAL Right 04/30/2015   Procedure: REMOVAL PORT-A-CATH;  Surgeon: Fanny Skates, MD;  Location: [place];  Service: General;  Laterality: Right;  . PORTACATH PLACEMENT N/A 11/23/2014   Procedure: INSERTION PORT-A-CATH WITH ULTRASOUND;  Surgeon: Fanny Skates, MD;  Location: [place];  Service: General;  Laterality: N/A;  . RHINOPLASTY    . SIMPLE MASTECTOMY WITH AXILLARY SENTINEL NODE BIOPSY Left 04/30/2015   Procedure: LEFT TOTAL  MASTECTOMY WITH LEFT AXILLARY SENTINEL NODE BIOPSY;  Surgeon: Fanny Skates, MD;  [facility];  Service: General;  Laterality: Left;    Family History  Problem Relation Age of Onset  . Heart disease Mother   . Hypertension Mother   . Diabetes Mother   . Heart disease Father   . Hypertension Father   . Cancer Maternal Grandmother        ? ovarian cancer vs. cervical  . Cancer Paternal Aunt        ? breast   Social History   Tobacco Use  . Smoking status: Former Smoker    Packs/day: 0.25    Years: 42.00    Pack years: 10.50    Types: Cigarettes    Quit date: 01/24/2017    Years since quitting: 3.4  . Smokeless tobacco: Never Used  Vaping Use  . Vaping Use: Never used  Substance Use Topics  . Alcohol use: No  . Drug use: No    Allergies  Allergen Reactions  . Hydrocodone Other (See Comments)    Patient does not want again, Its makes her sick.  . Codeine Nausea And Vomiting  . Spironolactone     Significant weakness, cramping along lower extremities    Current Meds  Medication Sig  . aspirin EC 81 MG tablet Take 1 tablet (81 mg total) by mouth daily.  Marland Kitchen atorvastatin (LIPITOR) 80 MG tablet Take 80 mg by mouth daily.  . carvedilol (COREG) 3.125 MG tablet Take 0.5 tablets (1.55 mg total) by mouth in the morning and at bedtime.  . digoxin (LANOXIN) 0.125 MG tablet Take 1 tablet (125  mcg total) by mouth daily.  Marland Kitchen ENTRESTO 24-26 MG TAKE 1 TABLET BY MOUTH 2 (TWO) TIMES DAILY. DUE FOR FOLLOW UP APPT  . esomeprazole (NEXIUM) 40 MG capsule Take 1 capsule (40 mg total) by mouth daily.  Marland Kitchen estradiol (ESTRACE) 1 MG tablet TAKE 1 TABLET BY MOUTH EVERY DAY (Patient taking differently: Take 1 mg by mouth every other day.)  . furosemide (LASIX) 20 MG tablet Take 20 mg by mouth daily as needed for edema.  Marland Kitchen HYDROcodone-acetaminophen (NORCO/VICODIN) 5-325 MG tablet Take 2 tablets by mouth every 4 (four) hours as needed for up to 3 days.  Marland Kitchen HYDROmorphone (DILAUDID) 2 MG tablet Take 0.5 tablets (1 mg total) by mouth every 6 (six) hours as needed for up to 7 days for severe pain.  Marland Kitchen lidocaine (LIDODERM) 5 % Place 1 patch onto the skin daily. (Patient taking differently: Place 1 patch onto the skin daily as needed (pain).)  . Lidocaine 4 % PTCH Apply  1 patch topically daily.  . magic mouthwash SOLN Take 10 mLs by mouth 4 (four) times daily as needed for mouth pain.  Marland Kitchen Propylene Glycol (SYSTANE BALANCE) 0.6 % SOLN Place 1 drop into both eyes daily as needed (dry eyes).  . tolterodine (DETROL LA) 2 MG 24 hr capsule Take 1 capsule (2 mg total) by mouth daily.    Objective: BP (!) 127/55   Pulse 87   Ht 5\' 2"  (1.575 m)   Wt 91 lb 0.8 oz (41.3 kg)   LMP 02/28/1999   BMI 16.65 kg/m   Physical Exam:  General: Thin elderly female, no acute distress.   Gait: Seated in a wheelchair  Evaluation of left shoulder demonstrates some swelling.  Ecchymosis over the distal upper arm.  Sensation is intact in the axillary patch.  Deltoid fires.  Fingers are warm and well-perfused.  Sensation is intact distally.  Range of motion of left shoulder deferred due to known fracture.    IMAGING: I personally reviewed images previously obtained from the ED  X-rays and CT scan of the left shoulder demonstrates a proximal humerus fracture, through the neck of the humerus.  There is displacement of the shaft  anterior to the humeral head.  Mild comminution at the calcar is noted.  Overall, the shoulder is located.   New Medications:  Meds ordered this encounter  Medications  . HYDROmorphone (DILAUDID) 2 MG tablet    Sig: Take 0.5 tablets (1 mg total) by mouth every 6 (six) hours as needed for up to 7 days for severe pain.    Dispense:  10 tablet    Refill:  0      Mordecai Rasmussen, MD  06/22/2020 5:02 PM

## 2020-06-22 NOTE — Patient Instructions (Signed)
We will schedule you for reverse shoulder arthroplasty, July 01, 2020

## 2020-06-22 NOTE — Progress Notes (Signed)
New Patient Visit  Assessment: Sherry Bender is a 70 y.o. RHD female with the following: Left proximal humerus fracture   Plan: Sherry Bender sustained a displaced left proximal humerus fracture.  The CT scan demonstrates that the humeral head proximal to the humeral shaft.  In addition, she has previously developed some rotator cuff arthropathy.  As result, I think she would be an excellent candidate for reverse shoulder arthroplasty.  This was discussed with the patient.  Risks and benefits of the surgery, including, but not limited to infection, bleeding, persistent pain, need for further surgery, non-union, malunion and more severe complications associated with anesthesia were discussed with the patient.  The patient has elected to proceed.  The patient is at an increased risk for persistent pain and stiffness, as well as general complications associated with anesthesia due to associated medical comorbidities including CHF.  Surgical Plan  Procedure: Left reverse shoulder arthroplasty Disposition: Observation Anesthesia: General, with a block Medical Comorbidities: History of breast cancer, CHF, cardiomyopathy  Follow-up: Return for After surgery; DOS July 01, 2020.  Subjective:  Chief Complaint  Patient presents with  . Left shoulder pain    History of Present Illness: Sherry Bender is a 70 y.o. RHD female who presents for evaluation of her left shoulder.  She sustained a mechanical fall approximately 10 days ago, but was not seen in the emergency department for 1 week after the injury.  Initially, she thought the pain would improve.  However, her pain continued and she subsequently went to the emergency department.  Of note, she was previously treated for a left proximal humerus fracture without surgery.  Her pain improved, but she did not regain her mobility and range of motion.  She was provided with some Norco in the emergency department, but this is causing her nausea.  She  has had some difficulty sleeping at night due to the pain.  She states the pain is worse when she wears her sling.  She does not have any numbness or tingling distally.   Review of Systems: No fevers or chills No numbness or tingling No chest pain No shortness of breath No bowel or bladder dysfunction No GI distress No headaches +nausea   Medical History:  Past Medical History:  Diagnosis Date  . Anxiety   . Arthritis   . Breast cancer (HCC) 2016  . Cardiomyopathy (HCC)    a. EF 25-30% by echocardiogram in 02/2019  . Coronary atherosclerosis    Cardiac CTA with negative FFR, nonobstructive March 2021  . Cricopharyngeus muscle dysfunction   . Gastric ulcer   . GERD (gastroesophageal reflux disease)   . Hiatal hernia   . Hyperlipidemia   . Hypertension   . Scoliosis   . Skin cancer   . Urinary frequency     Past Surgical History:  Procedure Laterality Date  . ABDOMINAL HYSTERECTOMY  2000   TAH/BSO  . ANTERIOR CERVICAL DECOMP/DISCECTOMY FUSION N/A 06/18/2014   Procedure: C4-5 C5-6 C6-7 ANTERIOR CERVICAL DECOMPRESSION/DISKECTOMY/FUSION;  Surgeon: Tia Alert, MD;  Location: [facility];  Service: Neurosurgery;  Laterality: N/A;  C4-5 C5-6 C6-7 ANTERIOR CERVICAL DECOMPRESSION/DISKECTOMY/FUSION  . ANTERIOR CERVICAL DECOMP/DISCECTOMY FUSION N/A 06/25/2014   Procedure: EXPLORATION OF CERVICAL FUSION WITH REVIOSN OF HARDWARE;  Surgeon: Tia Alert, MD;  Location: [facility];  Service: Neurosurgery;  Laterality: N/A;  . BIOPSY  01/01/2020   Procedure: BIOPSY;  Surgeon: Napoleon Form, MD;  Location: [facility];  Service: Endoscopy;;  . CERVICAL FUSION    .  CHOLECYSTECTOMY  1994  . COMPLETE MASTECTOMY W/ SENTINEL NODE BIOPSY Left 04/30/2015  . ESOPHAGOGASTRODUODENOSCOPY (EGD) WITH PROPOFOL N/A 01/01/2020   Procedure: ESOPHAGOGASTRODUODENOSCOPY (EGD) WITH PROPOFOL;  Surgeon: Mauri Pole, MD;  Location: [facility];  Service: Endoscopy;  Laterality: N/A;  .  FOOT NEUROMA SURGERY  01/2010   also had metatarasl shortened  . KNEE ARTHROSCOPY  2005   right knee  . MASTECTOMY Left 2016  . PORT-A-CATH REMOVAL Right 04/30/2015   Procedure: REMOVAL PORT-A-CATH;  Surgeon: Fanny Skates, MD;  Location: [place];  Service: General;  Laterality: Right;  . PORTACATH PLACEMENT N/A 11/23/2014   Procedure: INSERTION PORT-A-CATH WITH ULTRASOUND;  Surgeon: Fanny Skates, MD;  Location: [place];  Service: General;  Laterality: N/A;  . RHINOPLASTY    . SIMPLE MASTECTOMY WITH AXILLARY SENTINEL NODE BIOPSY Left 04/30/2015   Procedure: LEFT TOTAL  MASTECTOMY WITH LEFT AXILLARY SENTINEL NODE BIOPSY;  Surgeon: Fanny Skates, MD;  Location: [facility];  Service: General;  Laterality: Left;    Family History  Problem Relation Age of Onset  . Heart disease Mother   . Hypertension Mother   . Diabetes Mother   . Heart disease Father   . Hypertension Father   . Cancer Maternal Grandmother        ? ovarian cancer vs. cervical  . Cancer Paternal Aunt        ? breast   Social History   Tobacco Use  . Smoking status: Former Smoker    Packs/day: 0.25    Years: 42.00    Pack years: 10.50    Types: Cigarettes    Quit date: 01/24/2017    Years since quitting: 3.4  . Smokeless tobacco: Never Used  Vaping Use  . Vaping Use: Never used  Substance Use Topics  . Alcohol use: No  . Drug use: No    Allergies  Allergen Reactions  . Hydrocodone Other (See Comments)    Patient does not want again, Its makes her sick.  . Codeine Nausea And Vomiting  . Spironolactone     Significant weakness, cramping along lower extremities    Current Meds  Medication Sig  . aspirin EC 81 MG tablet Take 1 tablet (81 mg total) by mouth daily.  Marland Kitchen atorvastatin (LIPITOR) 80 MG tablet Take 80 mg by mouth daily.  . carvedilol (COREG) 3.125 MG tablet Take 0.5 tablets (1.55 mg total) by mouth in the morning and at bedtime.  . digoxin (LANOXIN) 0.125 MG tablet Take 1 tablet (125  mcg total) by mouth daily.  Marland Kitchen ENTRESTO 24-26 MG TAKE 1 TABLET BY MOUTH 2 (TWO) TIMES DAILY. DUE FOR FOLLOW UP APPT  . esomeprazole (NEXIUM) 40 MG capsule Take 1 capsule (40 mg total) by mouth daily.  Marland Kitchen estradiol (ESTRACE) 1 MG tablet TAKE 1 TABLET BY MOUTH EVERY DAY (Patient taking differently: Take 1 mg by mouth every other day.)  . furosemide (LASIX) 20 MG tablet Take 20 mg by mouth daily as needed for edema.  Marland Kitchen HYDROcodone-acetaminophen (NORCO/VICODIN) 5-325 MG tablet Take 2 tablets by mouth every 4 (four) hours as needed for up to 3 days.  Marland Kitchen HYDROmorphone (DILAUDID) 2 MG tablet Take 0.5 tablets (1 mg total) by mouth every 6 (six) hours as needed for up to 7 days for severe pain.  Marland Kitchen lidocaine (LIDODERM) 5 % Place 1 patch onto the skin daily. (Patient taking differently: Place 1 patch onto the skin daily as needed (pain).)  . Lidocaine 4 % PTCH Apply  1 patch topically daily.  . magic mouthwash SOLN Take 10 mLs by mouth 4 (four) times daily as needed for mouth pain.  Marland Kitchen Propylene Glycol (SYSTANE BALANCE) 0.6 % SOLN Place 1 drop into both eyes daily as needed (dry eyes).  . tolterodine (DETROL LA) 2 MG 24 hr capsule Take 1 capsule (2 mg total) by mouth daily.    Objective: BP (!) 127/55   Pulse 87   Ht 5\' 2"  (1.575 m)   Wt 91 lb 0.8 oz (41.3 kg)   LMP 02/28/1999   BMI 16.65 kg/m   Physical Exam:  General: Thin elderly female, no acute distress.   Gait: Seated in a wheelchair  Evaluation of left shoulder demonstrates some swelling.  Ecchymosis over the distal upper arm.  Sensation is intact in the axillary patch.  Deltoid fires.  Fingers are warm and well-perfused.  Sensation is intact distally.  Range of motion of left shoulder deferred due to known fracture.    IMAGING: I personally reviewed images previously obtained from the ED  X-rays and CT scan of the left shoulder demonstrates a proximal humerus fracture, through the neck of the humerus.  There is displacement of the shaft  anterior to the humeral head.  Mild comminution at the calcar is noted.  Overall, the shoulder is located.   New Medications:  Meds ordered this encounter  Medications  . HYDROmorphone (DILAUDID) 2 MG tablet    Sig: Take 0.5 tablets (1 mg total) by mouth every 6 (six) hours as needed for up to 7 days for severe pain.    Dispense:  10 tablet    Refill:  0      Mordecai Rasmussen, MD  06/22/2020 5:02 PM

## 2020-06-23 ENCOUNTER — Telehealth: Payer: Self-pay | Admitting: Orthopedic Surgery

## 2020-06-23 NOTE — Telephone Encounter (Signed)
Per Dr. Dallas Schimke. I saw Sherry Bender in clinic today. She sustained a fall and has a left proximal humerus fracture. She will benefit from surgery and I have recommended a reverse shoulder arthroplasty. I have requested time on January 13th for surgery. Please let me know if you have any reservations about er undergoing surgery. Because of the fracture, this is an urgent procedure (but not emergent). Please let me know if you have any questions or concerns.

## 2020-06-24 ENCOUNTER — Encounter (HOSPITAL_COMMUNITY): Payer: Self-pay | Admitting: Anesthesiology

## 2020-06-24 NOTE — Telephone Encounter (Signed)
I called [employer] to see if pre-certification was needed.  I spoke with Sherry Bender the intake specialist and she is starting the case.  Reference number for this case: [id number].    Clinicals will have to be faxed in per [facility]. She will send forms for the provider to fill out.

## 2020-06-28 NOTE — Patient Instructions (Addendum)
Your procedure is scheduled on: 07/01/2020  Report to [facility] at   8:15  AM.  Call this number if you have problems the morning of surgery: [phone]   Remember:   Do not Eat or Drink after midnight except carb drink at 6:00 am        No Smoking the morning of surgery  :  Take these medicines the morning of surgery with A SIP OF WATER: Carvedilol, Digoxin and Detrol   Do not wear jewelry, make-up or nail polish.  Do not wear lotions, powders, or perfumes. You may wear deodorant.  Do not shave 48 hours prior to surgery. Men may shave face and neck.  Do not bring valuables to the hospital.  Contacts, dentures or bridgework may not be worn into surgery.  Leave suitcase in the car. After surgery it may be brought to your room.  For patients admitted to the hospital, checkout time is 11:00 AM the day of discharge.   Patients discharged the day of surgery will not be allowed to drive home.    Special Instructions: Shower using CHG night before surgery and shower the day of surgery use CHG.  Use special wash - you have one bottle of CHG for all showers.  You should use approximately 1/2 of the bottle for each shower.  How to Use Chlorhexidine for Bathing Chlorhexidine gluconate (CHG) is a germ-killing (antiseptic) solution that is used to clean the skin. It can get rid of the bacteria that normally live on the skin and can keep them away for about 24 hours. To clean your skin with CHG, you may be given:  A CHG solution to use in the shower or as part of a sponge bath.  A prepackaged cloth that contains CHG. Cleaning your skin with CHG may help lower the risk for infection:  While you are staying in the intensive care unit of the hospital.  If you have a vascular access, such as a central line, to provide short-term or long-term access to your veins.  If you have a catheter to drain urine from your bladder.  If you are on a ventilator. A ventilator is a machine that helps you  breathe by moving air in and out of your lungs.  After surgery. What are the risks? Risks of using CHG include:  A skin reaction.  Hearing loss, if CHG gets in your ears.  Eye injury, if CHG gets in your eyes and is not rinsed out.  The CHG product catching fire. Make sure that you avoid smoking and flames after applying CHG to your skin. Do not use CHG:  If you have a chlorhexidine allergy or have previously reacted to chlorhexidine.  On babies younger than 87 months of age. How to use CHG solution  Use CHG only as told by your health care provider, and follow the instructions on the label.  Use the full amount of CHG as directed. Usually, this is one bottle. During a shower Follow these steps when using CHG solution during a shower (unless your health care provider gives you different instructions): 1. Start the shower. 2. Use your normal soap and shampoo to wash your face and hair. 3. Turn off the shower or move out of the shower stream. 4. Pour the CHG onto a clean washcloth. Do not use any type of brush or rough-edged sponge. 5. Starting at your neck, lather your body down to your toes. Make sure you follow these instructions: ? If  you will be having surgery, pay special attention to the part of your body where you will be having surgery. Scrub this area for at least 1 minute. ? Do not use CHG on your head or face. If the solution gets into your ears or eyes, rinse them well with water. ? Avoid your genital area. ? Avoid any areas of skin that have broken skin, cuts, or scrapes. ? Scrub your back and under your arms. Make sure to wash skin folds. 6. Let the lather sit on your skin for 1-2 minutes or as long as told by your health care provider. 7. Thoroughly rinse your entire body in the shower. Make sure that all body creases and crevices are rinsed well. 8. Dry off with a clean towel. Do not put any substances on your body afterward--such as powder, lotion, or  perfume--unless you are told to do so by your health care provider. Only use lotions that are recommended by the manufacturer. 9. Put on clean clothes or pajamas. 10. If it is the night before your surgery, sleep in clean sheets.   During a sponge bath Follow these steps when using CHG solution during a sponge bath (unless your health care provider gives you different instructions): 1. Use your normal soap and shampoo to wash your face and hair. 2. Pour the CHG onto a clean washcloth. 3. Starting at your neck, lather your body down to your toes. Make sure you follow these instructions: ? If you will be having surgery, pay special attention to the part of your body where you will be having surgery. Scrub this area for at least 1 minute. ? Do not use CHG on your head or face. If the solution gets into your ears or eyes, rinse them well with water. ? Avoid your genital area. ? Avoid any areas of skin that have broken skin, cuts, or scrapes. ? Scrub your back and under your arms. Make sure to wash skin folds. 4. Let the lather sit on your skin for 1-2 minutes or as long as told by your health care provider. 5. Using a different clean, wet washcloth, thoroughly rinse your entire body. Make sure that all body creases and crevices are rinsed well. 6. Dry off with a clean towel. Do not put any substances on your body afterward--such as powder, lotion, or perfume--unless you are told to do so by your health care provider. Only use lotions that are recommended by the manufacturer. 7. Put on clean clothes or pajamas. 8. If it is the night before your surgery, sleep in clean sheets. How to use CHG prepackaged cloths  Only use CHG cloths as told by your health care provider, and follow the instructions on the label.  Use the CHG cloth on clean, dry skin.  Do not use the CHG cloth on your head or face unless your health care provider tells you to.  When washing with the CHG cloth: ? Avoid your genital  area. ? Avoid any areas of skin that have broken skin, cuts, or scrapes. Before surgery Follow these steps when using a CHG cloth to clean before surgery (unless your health care provider gives you different instructions): 1. Using the CHG cloth, vigorously scrub the part of your body where you will be having surgery. Scrub using a back-and-forth motion for 3 minutes. The area on your body should be completely wet with CHG when you are done scrubbing. 2. Do not rinse. Discard the cloth and let the area air-dry.  Do not put any substances on the area afterward, such as powder, lotion, or perfume. 3. Put on clean clothes or pajamas. 4. If it is the night before your surgery, sleep in clean sheets.   For general bathing Follow these steps when using CHG cloths for general bathing (unless your health care provider gives you different instructions). 1. Use a separate CHG cloth for each area of your body. Make sure you wash between any folds of skin and between your fingers and toes. Wash your body in the following order, switching to a new cloth after each step: ? The front of your neck, shoulders, and chest. ? Both of your arms, under your arms, and your hands. ? Your stomach and groin area, avoiding the genitals. ? Your right leg and foot. ? Your left leg and foot. ? The back of your neck, your back, and your buttocks. 2. Do not rinse. Discard the cloth and let the area air-dry. Do not put any substances on your body afterward--such as powder, lotion, or perfume--unless you are told to do so by your health care provider. Only use lotions that are recommended by the manufacturer. 3. Put on clean clothes or pajamas. Contact a health care provider if:  Your skin gets irritated after scrubbing.  You have questions about using your solution or cloth. Get help right away if:  Your eyes become very red or swollen.  Your eyes itch badly.  Your skin itches badly and is red or swollen.  Your  hearing changes.  You have trouble seeing.  You have swelling or tingling in your mouth or throat.  You have trouble breathing.  You swallow any chlorhexidine. Summary  Chlorhexidine gluconate (CHG) is a germ-killing (antiseptic) solution that is used to clean the skin. Cleaning your skin with CHG may help to lower your risk for infection.  You may be given CHG to use for bathing. It may be in a bottle or in a prepackaged cloth to use on your skin. Carefully follow your health care provider's instructions and the instructions on the product label.  Do not use CHG if you have a chlorhexidine allergy.  Contact your health care provider if your skin gets irritated after scrubbing. This information is not intended to replace advice given to you by your health care provider. Make sure you discuss any questions you have with your health care provider. Document Revised: 11/21/2019 Document Reviewed: 11/21/2019 Elsevier Patient Education  [employer].  Reverse Total Shoulder Replacement, Care After This sheet gives you information about how to care for yourself after your procedure. Your health care provider may also give you more specific instructions. If you have problems or questions, contact your health care provider. What can I expect after the procedure? After the procedure, it is common to have:  Pain in the shoulder and arm.  Stiffness in the shoulder and arm. Follow these instructions at home: Medicines  Take over-the-counter and prescription medicines only as told by your health care provider.  Ask your health care provider if the medicine prescribed to you: ? Requires you to avoid driving or using machinery. ? Can cause constipation. You may need to take these actions to prevent or treat constipation:  Drink enough fluid to keep your urine pale yellow.  Take over-the-counter or prescription medicines.  Eat foods that are high in fiber, such as beans, whole grains,  and fresh fruits and vegetables.  Limit foods that are high in fat and processed sugars, such  as fried or sweet foods. If you have a sling:  Wear the sling as told by your health care provider. Remove it only as told by your health care provider.  Check the skin around your sling every day. Tell your health care provider about any concerns.  Loosen the sling if your fingers tingle, become numb, or turn cold and blue.  Keep the sling clean and dry. Bathing  Do not take baths, swim, or use a hot tub until your health care provider approves. Ask your health care provider if you may take showers. You may only be allowed to take sponge baths.  If the sling is not waterproof: ? Do not let it get wet. ? Cover it with a watertight covering when you take a bath or shower.  Keep your bandage (dressing) dry until your health care provider says it can be removed. Incision care  Follow instructions from your health care provider about how to take care of your incision. Make sure you: ? Wash your hands with soap and water for at least 20 seconds before and after you change your dressing. If soap and water are not available, use hand sanitizer. ? Change your dressing as told by your health care provider. ? Leave stitches (sutures), skin glue, or adhesive strips in place. These skin closures may need to stay in place for 2 weeks or longer. If adhesive strip edges start to loosen and curl up, you may trim the loose edges. Do not remove adhesive strips completely unless your health care provider tells you to do that.  Check your incision area every day for signs of infection. Check for: ? More redness, swelling, or pain. ? Fluid or blood. ? Warmth. ? Pus or a bad smell.   Managing pain, stiffness, and swelling  If directed, put ice on your shoulder. To do this: ? If you have a removable sling, remove it as told by your health care provider. ? Put ice in a plastic bag. ? Place a towel between your  skin and the bag. ? Leave the ice on for 20 minutes, 2-3 times a day. ? Remove the ice if your skin turns bright red. This is very important. If you cannot feel pain, heat, or cold, you have a greater risk of damage to the area.  Move your fingers and hand often to reduce stiffness and swelling.   Driving  If you were given a sedative during the procedure, it can affect you for several hours. Do not drive or operate machinery until your health care provider says that it is safe.  Ask your health care provider when it is safe to drive if you have a sling on your arm. Activity  Return to your normal activities as told by your health care provider. Ask your health care provider what activities are safe for you.  Do shoulder exercises as told by your health care provider.  Do not lift your arm above shoulder level until your health care provider approves.  Do not make large movements with your arm.  Do not push or pull things until your health care provider approves.  Do not lift anything that is heavier than 5 lb (2.3 kg) until your health care provider says that it is safe. General instructions  Do not use any products that contain nicotine or tobacco, such as cigarettes, e-cigarettes, and chewing tobacco. These can delay healing after surgery. If you need help quitting, ask your health care provider.  Tell your  health care provider if you plan to have dental work. Also: ? Tell your dentist about your joint replacement. ? Ask your health care provider if there are any special instructions you need to follow before having dental care and routine cleanings.  Keep all follow-up visits. This is important. Contact a health care provider if:  You feel nauseous or you vomit.  Your arm tingles or feels numb.  Your pain gets worse, even after taking pain medicine.  You have any of these signs of infection: ? More redness, swelling, or pain around your incision. ? Fluid or blood coming  from your incision. ? Warmth coming from your incision. ? Pus or a bad smell coming from your incision. ? A fever. Get help right away if:  Your shoulder joint moves out of place.  Your incision comes apart.  You have redness, swelling, pain, or warmth in your leg or arm.  You have chest pain or shortness of breath. These symptoms may represent a serious problem that is an emergency. Do not wait to see if the symptoms will go away. Get medical help right away. Call your local emergency services (911 in the [place] Do not drive yourself to the hospital. Summary  After the procedure, it is common to have some pain and stiffness.  Take over-the-counter and prescription medicines only as told by your health care provider.  Keep your bandage (dressing) dry until your health care provider says it can be removed.  Know the symptoms that should prompt you to contact your health care provider.  Do shoulder exercises as told by your health care provider. Ask your health care provider what activities are safe for you. This information is not intended to replace advice given to you by your health care provider. Make sure you discuss any questions you have with your health care provider. Document Revised: 11/19/2019 Document Reviewed: 11/19/2019 Elsevier Patient Education  2021 [place] Anesthesia, Adult, Care After This sheet gives you information about how to care for yourself after your procedure. Your health care provider may also give you more specific instructions. If you have problems or questions, contact your health care provider. What can I expect after the procedure? After the procedure, the following side effects are common:  Pain or discomfort at the IV site.  Nausea.  Vomiting.  Sore throat.  Trouble concentrating.  Feeling cold or chills.  Feeling weak or tired.  Sleepiness and fatigue.  Soreness and body aches. These side effects can affect parts of  the body that were not involved in surgery. Follow these instructions at home: For the time period you were told by your health care provider:  Rest.  Do not participate in activities where you could fall or become injured.  Do not drive or use machinery.  Do not drink alcohol.  Do not take sleeping pills or medicines that cause drowsiness.  Do not make important decisions or sign legal documents.  Do not take care of children on your own.   Eating and drinking  Follow any instructions from your health care provider about eating or drinking restrictions.  When you feel hungry, start by eating small amounts of foods that are soft and easy to digest (bland), such as toast. Gradually return to your regular diet.  Drink enough fluid to keep your urine pale yellow.  If you vomit, rehydrate by drinking water, juice, or clear broth. General instructions  If you have sleep apnea, surgery and certain medicines can  increase your risk for breathing problems. Follow instructions from your health care provider about wearing your sleep device: ? Anytime you are sleeping, including during daytime naps. ? While taking prescription pain medicines, sleeping medicines, or medicines that make you drowsy.  Have a responsible adult stay with you for the time you are told. It is important to have someone help care for you until you are awake and alert.  Return to your normal activities as told by your health care provider. Ask your health care provider what activities are safe for you.  Take over-the-counter and prescription medicines only as told by your health care provider.  If you smoke, do not smoke without supervision.  Keep all follow-up visits as told by your health care provider. This is important. Contact a health care provider if:  You have nausea or vomiting that does not get better with medicine.  You cannot eat or drink without vomiting.  You have pain that does not get better  with medicine.  You are unable to pass urine.  You develop a skin rash.  You have a fever.  You have redness around your IV site that gets worse. Get help right away if:  You have difficulty breathing.  You have chest pain.  You have blood in your urine or stool, or you vomit blood. Summary  After the procedure, it is common to have a sore throat or nausea. It is also common to feel tired.  Have a responsible adult stay with you for the time you are told. It is important to have someone help care for you until you are awake and alert.  When you feel hungry, start by eating small amounts of foods that are soft and easy to digest (bland), such as toast. Gradually return to your regular diet.  Drink enough fluid to keep your urine pale yellow.  Return to your normal activities as told by your health care provider. Ask your health care provider what activities are safe for you. This information is not intended to replace advice given to you by your health care provider. Make sure you discuss any questions you have with your health care provider. Document Revised: 02/19/2020 Document Reviewed: 09/18/2019 Elsevier Patient Education  2021 [employer].

## 2020-06-28 NOTE — Telephone Encounter (Signed)
I called and was on hold for a few minutes and was unable to check the status of the claim.   Pre-service center called to check status and was unable to see that the claim was started. No other concerns.

## 2020-06-29 ENCOUNTER — Other Ambulatory Visit (HOSPITAL_COMMUNITY)
Admission: RE | Admit: 2020-06-29 | Discharge: 2020-06-29 | Disposition: A | Discharge status: Home / Self Care | Payer: 59 | Source: Ambulatory Visit | Attending: Orthopedic Surgery | Admitting: Orthopedic Surgery

## 2020-06-29 ENCOUNTER — Encounter (HOSPITAL_COMMUNITY): Payer: Self-pay

## 2020-06-29 ENCOUNTER — Other Ambulatory Visit: Payer: Self-pay

## 2020-06-29 ENCOUNTER — Encounter (HOSPITAL_COMMUNITY)
Admission: RE | Admit: 2020-06-29 | Discharge: 2020-06-29 | Disposition: A | Discharge status: Home / Self Care | Payer: 59 | Source: Ambulatory Visit | Attending: Orthopedic Surgery | Admitting: Orthopedic Surgery

## 2020-06-29 DIAGNOSIS — Z20822 Contact with and (suspected) exposure to covid-19: Secondary | ICD-10-CM | POA: Diagnosis not present

## 2020-06-29 DIAGNOSIS — Z01812 Encounter for preprocedural laboratory examination: Secondary | ICD-10-CM | POA: Diagnosis not present

## 2020-06-29 LAB — BASIC METABOLIC PANEL
Anion gap: 7 (ref 5–15)
BUN: 18 mg/dL (ref 8–23)
CO2: 30 mmol/L (ref 22–32)
Calcium: 9.1 mg/dL (ref 8.9–10.3)
Chloride: 94 mmol/L — ABNORMAL LOW (ref 98–111)
Creatinine, Ser: <0.3 mg/dL — ABNORMAL LOW (ref 0.44–1.00)
Glucose, Bld: 90 mg/dL (ref 70–99)
Potassium: 4.6 mmol/L (ref 3.5–5.1)
Sodium: 131 mmol/L — ABNORMAL LOW (ref 135–145)

## 2020-06-29 LAB — CBC
HCT: 34.8 % — ABNORMAL LOW (ref 36.0–46.0)
Hemoglobin: 11.1 g/dL — ABNORMAL LOW (ref 12.0–15.0)
MCH: 31.4 pg (ref 26.0–34.0)
MCHC: 31.9 g/dL (ref 30.0–36.0)
MCV: 98.3 fL (ref 80.0–100.0)
Platelets: 173 10*3/uL (ref 150–400)
RBC: 3.54 MIL/uL — ABNORMAL LOW (ref 3.87–5.11)
RDW: 12.9 % (ref 11.5–15.5)
WBC: 4.7 10*3/uL (ref 4.0–10.5)
nRBC: 0 % (ref 0.0–0.2)

## 2020-06-30 ENCOUNTER — Telehealth: Payer: Self-pay | Admitting: Orthopedic Surgery

## 2020-06-30 DIAGNOSIS — S42292A Other displaced fracture of upper end of left humerus, initial encounter for closed fracture: Secondary | ICD-10-CM

## 2020-06-30 LAB — SARS CORONAVIRUS 2 (TAT 6-24 HRS): SARS Coronavirus 2: NEGATIVE

## 2020-06-30 NOTE — Telephone Encounter (Signed)
Updated the results with the reference number.

## 2020-06-30 NOTE — Telephone Encounter (Signed)
Patient called and wants to cancel her surgery at this time. Its postponed until further notice.

## 2020-06-30 NOTE — Telephone Encounter (Signed)
I have called and spoke with Bonnita Nasuti at [facility]   Reference number: [id number].

## 2020-07-01 ENCOUNTER — Inpatient Hospital Stay (HOSPITAL_COMMUNITY): Admission: RE | Admit: 2020-07-01 | Payer: 59 | Source: Home / Self Care | Admitting: Orthopedic Surgery

## 2020-07-01 ENCOUNTER — Other Ambulatory Visit: Payer: Self-pay | Admitting: Orthopedic Surgery

## 2020-07-01 ENCOUNTER — Encounter (HOSPITAL_COMMUNITY): Admission: RE | Payer: Self-pay | Source: Home / Self Care

## 2020-07-01 ENCOUNTER — Ambulatory Visit: Payer: Self-pay | Admitting: Orthopedic Surgery

## 2020-07-01 SURGERY — ARTHROPLASTY, SHOULDER, TOTAL
Anesthesia: Choice | Site: Shoulder | Laterality: Left

## 2020-07-01 MED ORDER — HYDROMORPHONE HCL 2 MG PO TABS: 1.0000 mg | ORAL_TABLET | Freq: Four times a day (QID) | ORAL | 0 refills | Status: AC | PRN

## 2020-07-01 NOTE — Telephone Encounter (Signed)
Patient called and wants to know if you can prescribe something for pain in the interim. Please advise.

## 2020-07-01 NOTE — Telephone Encounter (Signed)
Thank you   I have provided her with a refill on her Dilaudid.  Please stress to her that she should try and limit how much medication she takes as it can make pain control after surgery more difficult.  Thanks   Jeffory Snelgrove A. Amedeo Kinsman, MD [facility] [address] [place],  [place]  [address] Phone: [phone] Fax: [phone]

## 2020-07-01 NOTE — Telephone Encounter (Signed)
Referral in chart. This is still pending and patient is aware.

## 2020-07-02 NOTE — Telephone Encounter (Signed)
I called the patient and she voices understanding and she did not have any other questions.

## 2020-07-09 ENCOUNTER — Encounter (HOSPITAL_COMMUNITY): Payer: Self-pay

## 2020-07-12 ENCOUNTER — Other Ambulatory Visit: Payer: Self-pay

## 2020-07-12 ENCOUNTER — Encounter (HOSPITAL_COMMUNITY)
Admission: RE | Admit: 2020-07-12 | Discharge: 2020-07-12 | Disposition: A | Discharge status: Home / Self Care | Payer: 59 | Source: Ambulatory Visit | Attending: Orthopedic Surgery | Admitting: Orthopedic Surgery

## 2020-07-13 ENCOUNTER — Other Ambulatory Visit (HOSPITAL_COMMUNITY)
Admission: RE | Admit: 2020-07-13 | Discharge: 2020-07-13 | Disposition: A | Discharge status: Home / Self Care | Payer: 59 | Source: Ambulatory Visit | Attending: Orthopedic Surgery | Admitting: Orthopedic Surgery

## 2020-07-13 ENCOUNTER — Other Ambulatory Visit: Payer: Self-pay

## 2020-07-13 DIAGNOSIS — Z01812 Encounter for preprocedural laboratory examination: Secondary | ICD-10-CM | POA: Insufficient documentation

## 2020-07-13 DIAGNOSIS — Z20822 Contact with and (suspected) exposure to covid-19: Secondary | ICD-10-CM | POA: Insufficient documentation

## 2020-07-14 LAB — SARS CORONAVIRUS 2 (TAT 6-24 HRS): SARS Coronavirus 2: NEGATIVE

## 2020-07-15 ENCOUNTER — Encounter (HOSPITAL_COMMUNITY)
Admission: RE | Disposition: A | Discharge status: Home / Self Care | Payer: Self-pay | Source: Home / Self Care | Attending: Orthopedic Surgery

## 2020-07-15 ENCOUNTER — Ambulatory Visit (HOSPITAL_COMMUNITY): Payer: 59 | Admitting: Certified Registered"

## 2020-07-15 ENCOUNTER — Ambulatory Visit (HOSPITAL_COMMUNITY): Payer: 59

## 2020-07-15 ENCOUNTER — Observation Stay (HOSPITAL_COMMUNITY)
Admission: RE | Admit: 2020-07-15 | Discharge: 2020-07-16 | Disposition: A | Discharge status: Home / Self Care | Payer: 59 | Attending: Orthopedic Surgery | Admitting: Orthopedic Surgery

## 2020-07-15 ENCOUNTER — Encounter (HOSPITAL_COMMUNITY): Payer: Self-pay | Admitting: Orthopedic Surgery

## 2020-07-15 DIAGNOSIS — I509 Heart failure, unspecified: Secondary | ICD-10-CM | POA: Insufficient documentation

## 2020-07-15 DIAGNOSIS — Z9181 History of falling: Secondary | ICD-10-CM | POA: Insufficient documentation

## 2020-07-15 DIAGNOSIS — I110 Hypertensive heart disease with heart failure: Secondary | ICD-10-CM | POA: Diagnosis not present

## 2020-07-15 DIAGNOSIS — Z79899 Other long term (current) drug therapy: Secondary | ICD-10-CM | POA: Diagnosis not present

## 2020-07-15 DIAGNOSIS — Z7982 Long term (current) use of aspirin: Secondary | ICD-10-CM | POA: Diagnosis not present

## 2020-07-15 DIAGNOSIS — S42202A Unspecified fracture of upper end of left humerus, initial encounter for closed fracture: Secondary | ICD-10-CM | POA: Diagnosis present

## 2020-07-15 DIAGNOSIS — Z853 Personal history of malignant neoplasm of breast: Secondary | ICD-10-CM | POA: Diagnosis not present

## 2020-07-15 DIAGNOSIS — I429 Cardiomyopathy, unspecified: Secondary | ICD-10-CM | POA: Insufficient documentation

## 2020-07-15 DIAGNOSIS — X58XXXA Exposure to other specified factors, initial encounter: Secondary | ICD-10-CM | POA: Diagnosis not present

## 2020-07-15 DIAGNOSIS — Z87891 Personal history of nicotine dependence: Secondary | ICD-10-CM | POA: Diagnosis not present

## 2020-07-15 HISTORY — PX: REVERSE SHOULDER ARTHROPLASTY: SHX5054

## 2020-07-15 LAB — CBC
HCT: 28.8 % — ABNORMAL LOW (ref 36.0–46.0)
Hemoglobin: 9.4 g/dL — ABNORMAL LOW (ref 12.0–15.0)
MCH: 31.8 pg (ref 26.0–34.0)
MCHC: 32.6 g/dL (ref 30.0–36.0)
MCV: 97.3 fL (ref 80.0–100.0)
Platelets: 134 10*3/uL — ABNORMAL LOW (ref 150–400)
RBC: 2.96 MIL/uL — ABNORMAL LOW (ref 3.87–5.11)
RDW: 12.7 % (ref 11.5–15.5)
WBC: 9.2 10*3/uL (ref 4.0–10.5)
nRBC: 0 % (ref 0.0–0.2)

## 2020-07-15 SURGERY — ARTHROPLASTY, SHOULDER, TOTAL, REVERSE
Anesthesia: General | Site: Shoulder | Laterality: Left

## 2020-07-15 MED ORDER — FENTANYL CITRATE (PF) 100 MCG/2ML IJ SOLN
INTRAMUSCULAR | Status: DC | PRN
  Administered 2020-07-15: 100 ug via INTRAVENOUS

## 2020-07-15 MED ORDER — MIDAZOLAM HCL 2 MG/2ML IJ SOLN
INTRAMUSCULAR | Status: AC
  Filled 2020-07-15: qty 2

## 2020-07-15 MED ORDER — SODIUM CHLORIDE 0.9 % IR SOLN
Status: DC | PRN
  Administered 2020-07-15: 3000 mL

## 2020-07-15 MED ORDER — PHENYLEPHRINE HCL (PRESSORS) 10 MG/ML IV SOLN
INTRAVENOUS | Status: AC
  Filled 2020-07-15: qty 1

## 2020-07-15 MED ORDER — HYDROMORPHONE HCL 2 MG PO TABS
1.0000 mg | ORAL_TABLET | ORAL | Status: DC | PRN
  Filled 2020-07-15: qty 1

## 2020-07-15 MED ORDER — PANTOPRAZOLE SODIUM 40 MG PO TBEC
80.0000 mg | DELAYED_RELEASE_TABLET | Freq: Every day | ORAL | Status: DC
  Administered 2020-07-16: 80 mg via ORAL
  Filled 2020-07-15: qty 2

## 2020-07-15 MED ORDER — EPHEDRINE 5 MG/ML INJ
INTRAVENOUS | Status: AC
  Filled 2020-07-15: qty 10

## 2020-07-15 MED ORDER — VANCOMYCIN HCL 1000 MG IV SOLR
INTRAVENOUS | Status: DC | PRN
  Administered 2020-07-15: 1000 mg

## 2020-07-15 MED ORDER — ORAL CARE MOUTH RINSE: 15.0000 mL | Freq: Once | OROMUCOSAL | Status: DC

## 2020-07-15 MED ORDER — EPHEDRINE SULFATE 50 MG/ML IJ SOLN
INTRAMUSCULAR | Status: DC | PRN
  Administered 2020-07-15: 10 mg via INTRAVENOUS
  Administered 2020-07-15: 5 mg via INTRAVENOUS
  Administered 2020-07-15 (×2): 10 mg via INTRAVENOUS

## 2020-07-15 MED ORDER — HYDROMORPHONE HCL 2 MG PO TABS
2.0000 mg | ORAL_TABLET | ORAL | Status: DC | PRN
  Administered 2020-07-15: 2 mg via ORAL
  Filled 2020-07-15 (×2): qty 1

## 2020-07-15 MED ORDER — LACTATED RINGERS IV SOLN: Freq: Once | INTRAVENOUS | Status: DC

## 2020-07-15 MED ORDER — ACETAMINOPHEN 500 MG PO TABS
1000.0000 mg | ORAL_TABLET | Freq: Three times a day (TID) | ORAL | Status: DC
  Administered 2020-07-15 – 2020-07-16 (×3): 1000 mg via ORAL
  Filled 2020-07-15 (×3): qty 2

## 2020-07-15 MED ORDER — PHENYLEPHRINE HCL-NACL 10-0.9 MG/250ML-% IV SOLN
INTRAVENOUS | Status: DC | PRN
  Administered 2020-07-15: 20 ug/min via INTRAVENOUS

## 2020-07-15 MED ORDER — ACETAMINOPHEN 325 MG PO TABS: 325.0000 mg | ORAL_TABLET | Freq: Four times a day (QID) | ORAL | Status: DC | PRN

## 2020-07-15 MED ORDER — TRANEXAMIC ACID-NACL 1000-0.7 MG/100ML-% IV SOLN
1000.0000 mg | INTRAVENOUS | Status: AC
  Administered 2020-07-15: 1000 mg via INTRAVENOUS
  Filled 2020-07-15: qty 100

## 2020-07-15 MED ORDER — PHENYLEPHRINE HCL (PRESSORS) 10 MG/ML IV SOLN
INTRAVENOUS | Status: DC | PRN
  Administered 2020-07-15: 200 ug via INTRAVENOUS
  Administered 2020-07-15 (×5): 120 ug via INTRAVENOUS
  Administered 2020-07-15 (×2): 80 ug via INTRAVENOUS
  Administered 2020-07-15 (×2): 120 ug via INTRAVENOUS

## 2020-07-15 MED ORDER — ORAL CARE MOUTH RINSE: 15.0000 mL | Freq: Once | OROMUCOSAL | Status: AC

## 2020-07-15 MED ORDER — ROCURONIUM BROMIDE 10 MG/ML (PF) SYRINGE
PREFILLED_SYRINGE | INTRAVENOUS | Status: DC | PRN
  Administered 2020-07-15: 50 mg via INTRAVENOUS

## 2020-07-15 MED ORDER — ROPIVACAINE HCL 5 MG/ML IJ SOLN
INTRAMUSCULAR | Status: AC
  Filled 2020-07-15: qty 30

## 2020-07-15 MED ORDER — ROCURONIUM BROMIDE 10 MG/ML (PF) SYRINGE
PREFILLED_SYRINGE | INTRAVENOUS | Status: AC
  Filled 2020-07-15: qty 10

## 2020-07-15 MED ORDER — CEFAZOLIN SODIUM-DEXTROSE 2-4 GM/100ML-% IV SOLN
2.0000 g | Freq: Three times a day (TID) | INTRAVENOUS | Status: DC
  Administered 2020-07-15 – 2020-07-16 (×2): 2 g via INTRAVENOUS
  Filled 2020-07-15 (×2): qty 100

## 2020-07-15 MED ORDER — ONDANSETRON HCL 4 MG PO TABS: 4.0000 mg | ORAL_TABLET | Freq: Four times a day (QID) | ORAL | Status: DC | PRN

## 2020-07-15 MED ORDER — CHLORHEXIDINE GLUCONATE 0.12 % MT SOLN: 15.0000 mL | Freq: Once | OROMUCOSAL | Status: DC

## 2020-07-15 MED ORDER — DEXAMETHASONE SODIUM PHOSPHATE 4 MG/ML IJ SOLN
INTRAMUSCULAR | Status: DC | PRN
  Administered 2020-07-15: 4 mg via INTRAVENOUS

## 2020-07-15 MED ORDER — ALBUTEROL SULFATE (2.5 MG/3ML) 0.083% IN NEBU: 3.0000 mL | INHALATION_SOLUTION | Freq: Four times a day (QID) | RESPIRATORY_TRACT | Status: DC | PRN

## 2020-07-15 MED ORDER — CARVEDILOL 3.125 MG PO TABS
1.5500 mg | ORAL_TABLET | Freq: Two times a day (BID) | ORAL | Status: DC
  Administered 2020-07-15: 1.55 mg via ORAL
  Filled 2020-07-15 (×3): qty 1

## 2020-07-15 MED ORDER — FENTANYL CITRATE (PF) 100 MCG/2ML IJ SOLN
INTRAMUSCULAR | Status: AC
  Filled 2020-07-15: qty 2

## 2020-07-15 MED ORDER — PROPOFOL 10 MG/ML IV BOLUS
INTRAVENOUS | Status: DC | PRN
  Administered 2020-07-15: 140 mg via INTRAVENOUS

## 2020-07-15 MED ORDER — CEFAZOLIN SODIUM-DEXTROSE 2-4 GM/100ML-% IV SOLN
2.0000 g | INTRAVENOUS | Status: AC
  Administered 2020-07-15: 2 g via INTRAVENOUS
  Filled 2020-07-15: qty 100

## 2020-07-15 MED ORDER — 0.9 % SODIUM CHLORIDE (POUR BTL) OPTIME
TOPICAL | Status: DC | PRN
  Administered 2020-07-15: 1000 mL

## 2020-07-15 MED ORDER — ONDANSETRON HCL 4 MG/2ML IJ SOLN
INTRAMUSCULAR | Status: DC | PRN
  Administered 2020-07-15: 4 mg via INTRAVENOUS

## 2020-07-15 MED ORDER — PHENYLEPHRINE 40 MCG/ML (10ML) SYRINGE FOR IV PUSH (FOR BLOOD PRESSURE SUPPORT)
PREFILLED_SYRINGE | INTRAVENOUS | Status: AC
  Filled 2020-07-15: qty 30

## 2020-07-15 MED ORDER — BUPIVACAINE-EPINEPHRINE (PF) 0.5% -1:200000 IJ SOLN
INTRAMUSCULAR | Status: AC
  Filled 2020-07-15: qty 30

## 2020-07-15 MED ORDER — DIPHENHYDRAMINE HCL 12.5 MG/5ML PO ELIX: 12.5000 mg | ORAL_SOLUTION | ORAL | Status: DC | PRN

## 2020-07-15 MED ORDER — HYDROMORPHONE HCL 1 MG/ML IJ SOLN
0.5000 mg | INTRAMUSCULAR | Status: DC | PRN
  Administered 2020-07-16 (×2): 0.5 mg via INTRAVENOUS
  Filled 2020-07-15 (×2): qty 0.5

## 2020-07-15 MED ORDER — DEXAMETHASONE SODIUM PHOSPHATE 10 MG/ML IJ SOLN
INTRAMUSCULAR | Status: AC
  Filled 2020-07-15: qty 1

## 2020-07-15 MED ORDER — LACTATED RINGERS IV SOLN
INTRAVENOUS | Status: DC
  Administered 2020-07-15: 1000 mL via INTRAVENOUS

## 2020-07-15 MED ORDER — CHLORHEXIDINE GLUCONATE 0.12 % MT SOLN
15.0000 mL | Freq: Once | OROMUCOSAL | Status: AC
  Administered 2020-07-15: 15 mL via OROMUCOSAL

## 2020-07-15 MED ORDER — PROPOFOL 10 MG/ML IV BOLUS
INTRAVENOUS | Status: AC
  Filled 2020-07-15: qty 20

## 2020-07-15 MED ORDER — ONDANSETRON HCL 4 MG/2ML IJ SOLN
INTRAMUSCULAR | Status: AC
  Filled 2020-07-15: qty 2

## 2020-07-15 MED ORDER — ONDANSETRON HCL 4 MG/2ML IJ SOLN
4.0000 mg | Freq: Four times a day (QID) | INTRAMUSCULAR | Status: DC | PRN
  Administered 2020-07-16: 4 mg via INTRAVENOUS
  Filled 2020-07-15: qty 2

## 2020-07-15 MED ORDER — FESOTERODINE FUMARATE ER 4 MG PO TB24
4.0000 mg | ORAL_TABLET | Freq: Every day | ORAL | Status: DC
  Administered 2020-07-16: 4 mg via ORAL
  Filled 2020-07-15: qty 1

## 2020-07-15 SURGICAL SUPPLY — 87 items
BIT DRILL FLUTED 3.0 STRL (BIT) ×2 IMPLANT
BLADE 15 SAFETY STRL DISP (BLADE) ×2 IMPLANT
BLADE SAW SGTL 83.5X18.5 (BLADE) ×2 IMPLANT
BLADE SURG SZ10 CARB STEEL (BLADE) ×2 IMPLANT
BNDG COHESIVE 4X5 TAN STRL (GAUZE/BANDAGES/DRESSINGS) ×2 IMPLANT
BNDG GAUZE ELAST 4 BULKY (GAUZE/BANDAGES/DRESSINGS) ×2 IMPLANT
BOWL SMART MIX CTS (DISPOSABLE) ×2 IMPLANT
CAGE BASE TI HL 16X34X24 12D (Cage) ×2 IMPLANT
CEMENT HV SMART SET (Cement) ×2 IMPLANT
CHLORAPREP W/TINT 26 (MISCELLANEOUS) ×4 IMPLANT
CLOTH BEACON ORANGE TIMEOUT ST (SAFETY) ×2 IMPLANT
CLSR STERI-STRIP ANTIMIC 1/2X4 (GAUZE/BANDAGES/DRESSINGS) ×4 IMPLANT
COOLER ICEMAN CLASSIC (MISCELLANEOUS) ×2 IMPLANT
COVER LIGHT HANDLE STERIS (MISCELLANEOUS) ×4 IMPLANT
COVER PROBE W GEL 5X96 (DRAPES) ×2 IMPLANT
COVER WAND RF STERILE (DRAPES) ×2 IMPLANT
CUFF CRYO UNI SHDR 32X48 (MISCELLANEOUS) ×2 IMPLANT
CUP SUT UNIV REVERS 36+2 LEFT (Cup) ×2 IMPLANT
DECANTER SPIKE VIAL GLASS SM (MISCELLANEOUS) ×2 IMPLANT
DRAPE HALF SHEET 40X57 (DRAPES) ×2 IMPLANT
DRAPE INCISE IOBAN 44X35 STRL (DRAPES) ×2 IMPLANT
DRAPE ORTHO SPLIT 77X108 STRL (DRAPES) ×4
DRAPE SHOULDER BEACH CHAIR (DRAPES) ×2 IMPLANT
DRAPE SURG ORHT 6 SPLT 77X108 (DRAPES) ×2 IMPLANT
DRAPE U-SHAPE 47X51 STRL (DRAPES) ×2 IMPLANT
DRSG AQUACEL AG ADV 3.5X10 (GAUZE/BANDAGES/DRESSINGS) ×2 IMPLANT
ELECT REM PT RETURN 9FT ADLT (ELECTROSURGICAL) ×2
ELECTRODE REM PT RTRN 9FT ADLT (ELECTROSURGICAL) ×1 IMPLANT
GAUZE XEROFORM 5X9 LF (GAUZE/BANDAGES/DRESSINGS) ×2 IMPLANT
GLENOSPHERE 36 +4 LAT/24 (Joint) ×2 IMPLANT
GLOVE SKINSENSE NS SZ8.0 LF (GLOVE) ×3
GLOVE SKINSENSE STRL SZ8.0 LF (GLOVE) ×3 IMPLANT
GLOVE SRG 8 PF TXTR STRL LF DI (GLOVE) ×1 IMPLANT
GLOVE SURG UNDER POLY LF SZ7.5 (GLOVE) ×6 IMPLANT
GLOVE SURG UNDER POLY LF SZ8 (GLOVE) ×2
GOWN STRL REUS W/ TWL XL LVL3 (GOWN DISPOSABLE) ×1 IMPLANT
GOWN STRL REUS W/TWL LRG LVL3 (GOWN DISPOSABLE) ×4 IMPLANT
GOWN STRL REUS W/TWL XL LVL3 (GOWN DISPOSABLE) ×2
HANDPIECE INTERPULSE COAX TIP (DISPOSABLE) ×2
HOOD W/PEELAWAY (MISCELLANEOUS) ×8 IMPLANT
INST SET MINOR BONE (KITS) ×2 IMPLANT
IV NS IRRIG 3000ML ARTHROMATIC (IV SOLUTION) ×2 IMPLANT
KIT POSITION SHOULDER SCHLEI (MISCELLANEOUS) ×2 IMPLANT
KIT TURNOVER KIT A (KITS) ×2 IMPLANT
LINER HUMERAL 36 +3MM SM (Shoulder) ×2 IMPLANT
MANIFOLD NEPTUNE II (INSTRUMENTS) ×2 IMPLANT
MARKER SKIN DUAL TIP RULER LAB (MISCELLANEOUS) ×2 IMPLANT
NEEDLE HYPO 21X1.5 SAFETY (NEEDLE) ×2 IMPLANT
NS IRRIG 1000ML POUR BTL (IV SOLUTION) ×2 IMPLANT
PACK BASIC III (CUSTOM PROCEDURE TRAY) ×2
PACK BASIC LIMB (CUSTOM PROCEDURE TRAY) ×2 IMPLANT
PACK SRG BSC III STRL LF ECLPS (CUSTOM PROCEDURE TRAY) ×1 IMPLANT
PACK TOTAL JOINT (CUSTOM PROCEDURE TRAY) ×2 IMPLANT
PAD ABD 5X9 TENDERSORB (GAUZE/BANDAGES/DRESSINGS) ×8 IMPLANT
PAD ABD 8X10 STRL (GAUZE/BANDAGES/DRESSINGS) ×8 IMPLANT
PENCIL SMOKE EVACUATOR (MISCELLANEOUS) ×2 IMPLANT
PIN NITINOL TARGETER 2.8 (PIN) ×2 IMPLANT
PIN SET MODULAR GLENOID SYSTEM (PIN) ×2 IMPLANT
RESTRICTOR CEMENT PE SZ 2 (Cement) ×2 IMPLANT
SCREW CENTRAL MODULAR 25 (Screw) ×2 IMPLANT
SCREW PERI LOCK 5.5X32 (Screw) ×2 IMPLANT
SCREW PERI LOCK 5.5X36 (Screw) ×2 IMPLANT
SCREW PERIPHERAL NL 4.5X16 (Screw) ×4 IMPLANT
SET BASIN LINEN APH (SET/KITS/TRAYS/PACK) ×2 IMPLANT
SET HNDPC FAN SPRY TIP SCT (DISPOSABLE) ×1 IMPLANT
SLING ULTRA III MED (ORTHOPEDIC SUPPLIES) ×2 IMPLANT
SPONGE LAP 18X18 RF (DISPOSABLE) ×2 IMPLANT
STEM HUMERAL MOD SZ 5 135 DEG (Stem) ×2 IMPLANT
SUT FIBERTAPE CERCLAGE 2 48 (SUTURE) ×4 IMPLANT
SUT FIBERWIRE #2 38 T-5 BLUE (SUTURE) ×2
SUT MNCRL AB 4-0 PS2 18 (SUTURE) ×4 IMPLANT
SUT MON AB 2-0 CT1 36 (SUTURE) ×4 IMPLANT
SUT NUROLON CT 2 BLK #1 18IN (SUTURE) IMPLANT
SUT VIC AB 0 CT1 27 (SUTURE) ×4
SUT VIC AB 0 CT1 27XBRD ANTBC (SUTURE) ×2 IMPLANT
SUTURE FIBERWR #2 38 T-5 BLUE (SUTURE) ×1 IMPLANT
SUTURE TAPE 1.3 40 TPR END (SUTURE) ×4 IMPLANT
SUTURETAPE 1.3 40 TPR END (SUTURE) ×8
SYR 10ML LL (SYRINGE) ×2 IMPLANT
SYR 30ML LL (SYRINGE) ×2 IMPLANT
SYR BULB IRRIG 60ML STRL (SYRINGE) ×2 IMPLANT
SYR TOOMEY 50ML (SYRINGE) ×2 IMPLANT
TENSIONER FIBERTAPE CERCLAGE (DISPOSABLE) ×2 IMPLANT
TOWEL OR 17X26 4PK STRL BLUE (TOWEL DISPOSABLE) ×2 IMPLANT
TOWER CARTRIDGE SMART MIX (DISPOSABLE) ×2 IMPLANT
WATER STERILE IRR 1000ML POUR (IV SOLUTION) ×2 IMPLANT
YANKAUER SUCT 12FT TUBE ARGYLE (SUCTIONS) ×2 IMPLANT

## 2020-07-15 NOTE — Anesthesia Procedure Notes (Signed)
Procedure Name: Intubation Date/Time: 07/15/2020 9:35 AM Performed by: Jonna Munro, CRNA Pre-anesthesia Checklist: Patient identified, Emergency Drugs available, Suction available, Patient being monitored and Timeout performed Patient Re-evaluated:Patient Re-evaluated prior to induction Oxygen Delivery Method: Circle system utilized Preoxygenation: Pre-oxygenation with 100% oxygen Ventilation: Mask ventilation without difficulty Laryngoscope Size: Mac and 3 Grade View: Grade I Tube type: Oral Tube size: 7.0 mm Number of attempts: 1 Airway Equipment and Method: Stylet Placement Confirmation: ETT inserted through vocal cords under direct vision,  positive ETCO2 and breath sounds checked- equal and bilateral Secured at: 22 cm Tube secured with: Tape Dental Injury: Teeth and Oropharynx as per pre-operative assessment

## 2020-07-15 NOTE — Anesthesia Preprocedure Evaluation (Signed)
Anesthesia Evaluation  Patient identified by MRN, date of birth, ID band Patient awake    Reviewed: Allergy & Precautions, H&P , NPO status , Patient's Chart, lab work & pertinent test results, reviewed documented beta blocker date and time   History of Anesthesia Complications (+) PONV and history of anesthetic complications  Airway Mallampati: II  TM Distance: >3 FB Neck ROM: full    Dental no notable dental hx. (+) Teeth Intact   Pulmonary neg pulmonary ROS, former smoker,    Pulmonary exam normal breath sounds clear to auscultation       Cardiovascular Exercise Tolerance: Good hypertension, + CAD and +CHF   Rhythm:regular Rate:Normal     Neuro/Psych PSYCHIATRIC DISORDERS Anxiety  Neuromuscular disease    GI/Hepatic Neg liver ROS, hiatal hernia, PUD, GERD  Medicated,  Endo/Other  negative endocrine ROS  Renal/GU negative Renal ROS  negative genitourinary   Musculoskeletal   Abdominal   Peds  Hematology  (+) Blood dyscrasia, anemia ,   Anesthesia Other Findings Cardiomyopathy has resolved with medical therapy.  EF 55-60%.  Reproductive/Obstetrics negative OB ROS                             Anesthesia Physical Anesthesia Plan  ASA: III  Anesthesia Plan: General   Post-op Pain Management:  [facility] for Post-op pain and GA combined w/ [facility] for post-op pain   Induction:   PONV Risk Score and Plan: Ondansetron  Airway Management Planned:   Additional Equipment:   Intra-op Plan:   Post-operative Plan:   Informed Consent: I have reviewed the patients History and Physical, chart, labs and discussed the procedure including the risks, benefits and alternatives for the proposed anesthesia with the patient or authorized representative who has indicated his/her understanding and acceptance.     Dental Advisory Given  Plan Discussed with: CRNA  Anesthesia Plan Comments:          Anesthesia Quick Evaluation

## 2020-07-15 NOTE — Op Note (Signed)
Orthopaedic Surgery Operative Note (CSN: [id number])  Sherry Bender  21-Jul-1950 Date of Surgery: 07/15/2020   Diagnoses:  Left proximal humerus fracture  Procedure: Left Reverse Shoulder Arthroplasty for a fracture    Operative Finding Successful completion of the planned procedure.  Humeral implant was cemented.    Post-Op Diagnosis: Same Surgeons:Primary: Mordecai Rasmussen, MD Assistants:  Marquita Palms Location: [facility] OR ROOM 4 Anesthesia: General with regional anesthesia Antibiotics: Ancef 2 g with local vancomycin powder 1 g at the surgical site Tourniquet time: * No tourniquets in log * Estimated Blood Loss: 573 cc Complications: None Specimens: None Implants: Implant Name Type Inv. Item Serial No. Manufacturer Lot No. LRB No. Used Action  univers revers modular glenoid system central screw    [employer] [id number] Left 1 Implanted  univers revers modular glenoid system baseplate    [employer] [id number] Left 1 Implanted  univers revers modular glenoid system peripheral screw; locking    [employer] [id number] Left 1 Implanted  univers revers modular glenoid system peripheral screw; locking    [employer] [id number] Left 1 Implanted  univers revers modular glenoid system peripheral screw; nonlocking    [employer] [id number] Left 1 Implanted  univers revers modular glenoid system peripheral screw; nonlocking    [employer] [id number] Left 1 Implanted  univers revers modular glenoid system glenosphere    [employer] [id number] Left 1 Implanted  univers revers humeral stem    [employer] [id number] Left 1 Implanted  univers revers suture cup    [employer] [id number] Left 1 Implanted  univer revers humeral insert small    [employer] 21.00170 Left 1 Implanted  CEMENT HV SMART SET - [id number] Cement CEMENT HV SMART SET  [employer] [id number] Left 1 Implanted  RESTRICTOR CEMENT PE SZ 2 - [id number] Cement RESTRICTOR CEMENT PE SZ 2  [employer] [id number] Left 1 Implanted    Indications  for Surgery:   Sherry Bender is a 70 y.o. female with a recurrent left proximal humerus fracture.  She has significant pain and very little function of her arm before a recent fall caused another fracture.  Benefits and risks of operative and nonoperative management were discussed prior to surgery with patient/guardian(s) and informed consent form was completed.  Specific risks including infection, need for additional surgery, stiffness, dislocation, persistent pain, nonunion, malunion, damage to surrounding structures and more severe complications associated with anesthesia were discussed.  She elected to proceed.    Procedure:   The patient was identified properly. Informed consent was obtained and the surgical site was marked. The patient was taken up to suite where general anesthesia was induced.  The patient was positioned in beach chair position using a spider arm holder.  The left shoulder was prepped and draped in the usual sterile fashion.  Timeout was performed before the beginning of the case.  She received Ancef and TXA prior to incision.  Standard deltopectoral approach was performed with a #10 blade. We dissected down to the subcutaneous tissues.  The cephalic vein was not visualized, secondary to trauma and previous chest wall surgery. Clavipectoral fascia was incised in line with the incision. Deep retractors were placed. The long head of the biceps tendon was identified and there was significant tenosynovitis present proximally.  Tenodesis was performed to the pectoralis tendon with #2 Fiberwire. The remaining biceps was followed up into the rotator interval where it was released.    We used the bicipital groove as a landmark for the lesser and  greater tuberosity fragments.  We were able to mobilize the lesser tuberosity fragment and placed stay sutures in the bone tendon junction to help with mobilization.  We placed 2 suture tapes at the bone tendon junction.    We carefully identified  the head fragment were able to manually remove it.  This came out in multiple pieces as the humeral head fragments were partially healed.  The greater tuberosity was subsequently noted to be intact.  The rotator cuff tendons were attached to the greater tuberosity, but were very diminutive and scarred down to the intact tuberosity.  Some of the superior rotator cuff tendons was excised with the humeral head fragments.   At this point the axillary nerve was found and palpated and with a tug test noted to be intact.  It was then protected throughout the remainder of the case with blunt retractors.    We then released the SGHL with bovie cautery prior to placing a curved mayo at the junction of the anterior glenoid well above the axillary nerve and bluntly dissecting the subscapularis from the capsule.  We then carefully protected the axillary nerve as we gently released the inferior capsule to fully mobilize the subscapularis.  An anterior deltoid retractor was then placed as well as a small Hohmann retractor superiorly.   The glenoid was relatively preserved in this fracture patient.  The remaining labrum was removed circumferentially taking great care not to disrupt the posterior capsule.    Based on the preoperative CT scan, the patient specific glenoid drill guide was placed and used to drill a guide pin in the center, inferior position. The glenoid face was then reamed concentrically over the guide wire.  We removed more bone inferiorly, per our preoperative template.  The center hole was drilled over the guidepin in a near anatomic angle of version. Next the glenoid vault was drilled back to a depth of 25 mm.  We tapped and then placed a 24 mm size baseplate with +2 lateralization was selected with a 25 mm length central screw.  The base plate was screwed into the glenoid vault obtaining secure fixation. We next placed superior and inferior locking screws for additional fixation, followed by 2 additional  screws that were placed bicortically.  Next a 36 mm glenosphere was selected and impacted onto the baseplate. The center screw was tightened.   We then repositioned the arm to give access to the humeral shaft fragment.  She had a large cyst that was positioned more anterior within the shaft. We broached starting with a size one broach and broaching up to a 5.5 which obtained an appropriate fit above the pec but was not solidly fixed necessitating cement. In addition, as we were broaching, the proximal aspect of the humeral shaft was splitting from the pressure.  We subsequently placed 2 cerclage sutures which effectively maintained the hoop stresses   At that point we prepared the humeral shaft for cement.  We irrigated it copiously and then placed a cement restrictor at the appropriate level below the trial stem.  We then placed cement and the stem, and held in place for 15 minutes while the cement set.  We achieved good fixation of the component in this fashion.   We trialed with multiple size tray and polyethylene options and selected a posterior offset head and liner, which provided good stability and range of motion without excess soft tissue tension. The offset was dialed in to match the normal anatomy. The shoulder was trialed.  There was good ROM in all planes and the shoulder was stable with no inferior translation.   We then mobilized her lesser tuberosities again and placed the anterior deep limbs of the suture tapes through the eyelets on the implant.  These were then tied over the lesser tuberosity, achieving an excellent reduction.   Tuberosities moved as a unit were happy with her overall reduction.  We irrigated copiously at this point.  Hemostasis was obtained. The deltopectoral interval was reapproximated with 0 vicryl. The subcutaneous tissues were closed with 2-0 monocryl and the skin was closed with running monocryl.     The wounds were cleaned and dried and an Aquacel dressing was  placed. The drapes taken down. The arm was placed into sling with abduction pillow. Patient was awakened, extubated, and transferred to the recovery room in stable condition. There were no intraoperative complications.    Post-operative plan:  The patient will be admitted for over night observation. She is to remain in her sling at all times until the first postoperative appointment.  She can perform simple ADLs using her left arm.    DVT prophylaxis Aspirin 81 mg twice daily for 6 weeks.    Pain control with PRN pain medication preferring oral medicines.   Follow up plan will be scheduled in approximately 14 days for incision check and XR.

## 2020-07-15 NOTE — Interval H&P Note (Signed)
History and Physical Interval Note:  07/15/2020 8:45 AM  Sherry Bender  has presented today for surgery, with the diagnosis of Left proximal humerus fracture.  The various methods of treatment have been discussed with the patient and family. After consideration of risks, benefits and other options for treatment, the patient has consented to  Procedure(s): REVERSE SHOULDER ARTHROPLASTY (Left) as a surgical intervention.  The patient's history has been reviewed, patient examined, no change in status, stable for surgery.  I have reviewed the patient's chart and labs.  Questions were answered to the patient's satisfaction.    History of left proximal humerus fracture with more recent fall and repeat left proximal humerus fracture.  Will benefit from reverse shoulder arthroplasty for a fracture.  She can be admitted for over night observation if that is her preference.    Mordecai Rasmussen

## 2020-07-15 NOTE — Anesthesia Procedure Notes (Signed)
Anesthesia Regional Block: Interscalene brachial plexus block   Pre-Anesthetic Checklist: ,, timeout performed, Correct Patient, Correct Site, Correct Laterality, Correct Procedure, Correct Position, site marked, Risks and benefits discussed,  Surgical consent,  Pre-op evaluation,  At surgeon's request and post-op pain management  Laterality: Left     Needles:  Injection technique: Single-shot  Needle Type: Stimulator Needle - 40     Needle Length: 5cm  Needle Gauge: 22   Needle insertion depth: 0.5 cm   Additional Needles:   Procedures:, nerve stimulator,,, ultrasound used (permanent image in chart), intact distal pulses,,,   Nerve Stimulator or Paresthesia:  Response: Thenar twitch, 0.4 mA,   Additional Responses:   Narrative:  Start time: 07/15/2020 8:51 AM End time: 07/15/2020 8:57 AM Injection made incrementally with aspirations every 5 mL.  Performed by: Personally  Anesthesiologist: Louann Sjogren, MD

## 2020-07-15 NOTE — Transfer of Care (Signed)
Immediate Anesthesia Transfer of Care Note  Patient: Sherry Bender  Procedure(s) Performed: REVERSE SHOULDER ARTHROPLASTY (Left Shoulder)  Patient Location: PACU  Anesthesia Type:General  Level of Consciousness: awake, alert , oriented and patient cooperative  Airway & Oxygen Therapy: Patient Spontanous Breathing and Patient connected to face mask oxygen  Post-op Assessment: Report given to RN and Post -op Vital signs reviewed and stable  Post vital signs: Reviewed and stable  Last Vitals:  Vitals Value Taken Time  BP    Temp    Pulse    Resp    SpO2      Last Pain:  Vitals:   07/15/20 0827  TempSrc: Oral         Complications: No complications documented.

## 2020-07-15 NOTE — Plan of Care (Signed)

## 2020-07-15 NOTE — Anesthesia Postprocedure Evaluation (Signed)
Anesthesia Post Note  Patient: Sherry Bender  Procedure(s) Performed: REVERSE SHOULDER ARTHROPLASTY (Left Shoulder)  Patient location during evaluation: PACU Anesthesia Type: General Level of consciousness: awake, oriented, awake and alert and patient cooperative Pain management: satisfactory to patient Vital Signs Assessment: post-procedure vital signs reviewed and stable Respiratory status: spontaneous breathing, respiratory function stable and nonlabored ventilation Cardiovascular status: tachycardic Postop Assessment: no apparent nausea or vomiting Anesthetic complications: no   No complications documented.   Last Vitals:  Vitals:   07/15/20 0827  BP: (!) 123/97  Resp: 18  Temp: 36.6 C  SpO2: 99%    Last Pain:  Vitals:   07/15/20 0827  TempSrc: Oral                 Natascha Edmonds

## 2020-07-15 NOTE — Progress Notes (Signed)
Husband notified of patient being admitted to room 313  Patient received LR bolus of 800cc per anesthesia for decreased UOP, tachycardia, hypotension, patient alert oriented tolerating diet sprite.  Labs draw from Right hand IV after 2 unsuccessful venipucture attempts per anesthesia, discard tube drawn before specimen and IV flushed well after. Pt tolerated well

## 2020-07-16 DIAGNOSIS — S42202A Unspecified fracture of upper end of left humerus, initial encounter for closed fracture: Secondary | ICD-10-CM | POA: Diagnosis not present

## 2020-07-16 MED ORDER — CHLORHEXIDINE GLUCONATE CLOTH 2 % EX PADS
6.0000 | MEDICATED_PAD | Freq: Every day | CUTANEOUS | Status: DC
  Administered 2020-07-16: 6 via TOPICAL

## 2020-07-16 MED ORDER — ACETAMINOPHEN 500 MG PO TABS: 1000.0000 mg | ORAL_TABLET | Freq: Three times a day (TID) | ORAL | 0 refills | Status: AC

## 2020-07-16 MED ORDER — CELECOXIB 100 MG PO CAPS: 100.0000 mg | ORAL_CAPSULE | Freq: Every day | ORAL | 0 refills | Status: AC

## 2020-07-16 MED ORDER — ASPIRIN EC 81 MG PO TBEC: 81.0000 mg | DELAYED_RELEASE_TABLET | Freq: Two times a day (BID) | ORAL | 0 refills | Status: AC

## 2020-07-16 MED ORDER — ONDANSETRON HCL 4 MG PO TABS: 4.0000 mg | ORAL_TABLET | Freq: Three times a day (TID) | ORAL | 0 refills | Status: AC | PRN

## 2020-07-16 MED ORDER — HYDROMORPHONE HCL 2 MG PO TABS
1.0000 mg | ORAL_TABLET | ORAL | 0 refills | Status: AC | PRN
Start: 2020-07-16 — End: 2020-07-23

## 2020-07-16 NOTE — Plan of Care (Signed)
  Problem: Acute Rehab OT Goals (only OT should resolve) Goal: Pt. Will Perform Eating Flowsheets (Taken 07/16/2020 0940) Pt Will Perform Eating:  with modified independence  with set-up  sitting Goal: Pt. Will Perform Grooming Flowsheets (Taken 07/16/2020 0940) Pt Will Perform Grooming:  sitting  standing  with supervision  with set-up Goal: Pt. Will Perform Upper Body Dressing Flowsheets (Taken 07/16/2020 0940) Pt Will Perform Upper Body Dressing:  with supervision  sitting Goal: Pt. Will Perform Lower Body Dressing Flowsheets (Taken 07/16/2020 0940) Pt Will Perform Lower Body Dressing:  with min assist  sitting/lateral leans  sit to/from stand Goal: Pt. Will Transfer To Toilet Flowsheets (Taken 07/16/2020 0940) Pt Will Transfer to Toilet:  with supervision  stand pivot transfer Goal: Pt/Caregiver Will Perform Home Exercise Program Flowsheets (Taken 07/16/2020 0940) Pt/caregiver will Perform Home Exercise Program:  Increased strength  Both right and left upper extremity

## 2020-07-16 NOTE — TOC Transition Note (Signed)
Transition of Care [facility]) - CM/SW Discharge Note  Patient Details  Name: Sherry Bender MRN: [id number] Date of Birth: 03/04/1951  [facility]) CM/SW Contact:  Sherie Don, LCSW Phone Number: 07/16/2020, 11:07 AM  Clinical Narrative: Patient is a 70 year old female who was under observation for closed fracture of left proximal humerus. PT and OT evaluations recommended SNF. CSW met with patient to discuss recommendations. Patient declined SNF and prefers to discharge home with [facility]. CSW followed up with Helene Kelp with [facility] to confirm they have the [facility] orders. [facility] has the orders. Physician updated. TOC signing off.  Final next level of care: [place] Barriers to Discharge: Barriers Resolved  Patient Goals and CMS Choice Patient states their goals for this hospitalization and ongoing recovery are:: Discharge home with [facility] CMS Medicare.gov Compare Post Acute Care list provided to:: Patient Choice offered to / list presented to : Patient  Discharge Plan and Services        DME Arranged: N/A DME Agency: NA HH Arranged: PT,OT [place] Agency: [facility] (formerly [employer]) Date [employer]: 07/16/20 Time [place]: [phone] Representative spoke with at [place]: Helene Kelp  Readmission Risk Interventions No flowsheet data found.

## 2020-07-16 NOTE — Plan of Care (Signed)
  Problem: Acute Rehab PT Goals(only PT should resolve) Goal: Patient Will Transfer Sit To/From Stand Outcome: Progressing Flowsheets (Taken 07/16/2020 0950) Patient will transfer sit to/from stand:  with minimal assist  with moderate assist Goal: Pt Will Transfer Bed To Chair/Chair To Bed Outcome: Progressing Flowsheets (Taken 07/16/2020 0950) Pt will Transfer Bed to Chair/Chair to Bed:  with min assist  with mod assist Goal: Pt Will Ambulate Outcome: Progressing Flowsheets (Taken 07/16/2020 0950) Pt will Ambulate:  15 feet  with minimal assist  with least restrictive assistive device  9:50 AM, 07/16/20 Mearl Latin PT, DPT Physical Therapist at [facility]

## 2020-07-16 NOTE — Progress Notes (Signed)
Pt called out requesting pain medication.  IVP dilaudid scanned at 0338 to administer.  Pt sat on side of bed with help.  Asked RN if pain medication could wait, because she needed to use restroom and a BSC needs to be found now.  Informed pt that medication already scanned and then administered.  RN requested help getting pt to [facility]  Before attempting to stand with FWW, pt stated she was dizzy.  Discussed with pt to use bedpan, pt agreeable.  Bedpan placed, small amount of urine noted.  Pt sat up on bedside again, requesting water and feeling nauseous. Pt then informed RN that "pain medicine always makes me feel this way".  Small emesis x2 noted, yellow/green in color.  RN attempted to adjust sling to appropriate position without success.  Pt was not able to sit up unassisted long periods of time, but was able to stand with assistance and walker earlier in shift.  Pt returned to lying position with HOB elevated.  Iceman machine refilled with ice and water and attached to pt.

## 2020-07-16 NOTE — Discharge Summary (Signed)
Patient ID: Sherry Bender MRN: [id number] DOB/AGE: 12/19/1950 70 y.o.  Admit date: 07/15/2020 Discharge date: 07/16/2020  Admission Diagnoses: Left proximal humerus fracture  Discharge Diagnoses:  Active Problems:   Closed fracture of left proximal humerus   Past Medical History:  Diagnosis Date  . Anxiety   . Arthritis   . Breast cancer ([place]) 2016  . Cardiomyopathy ([place])    a. EF 25-30% by echocardiogram in 02/2019  . Coronary atherosclerosis    Cardiac CTA with negative FFR, nonobstructive March 2021  . Cricopharyngeus muscle dysfunction   . Gastric ulcer   . GERD (gastroesophageal reflux disease)   . Hiatal hernia   . Hyperlipidemia   . Hypertension   . PONV (postoperative nausea and vomiting)   . Scoliosis   . Scoliosis   . Skin cancer   . Urinary frequency      Procedures Performed: Reverse left shoulder arthroplasty for a fracture  Discharged Condition: fair  Hospital Course: Patient brought in as an outpatient for surgery.  Tolerated procedure well.  Was kept for monitoring overnight for pain control and medical monitoring postop and was found to be stable for DC home the morning after surgery.  Patient was instructed on specific activity restrictions and all questions were answered.  She was evaluated by PT/OT and all necessary equipment will be coordinated.  I have made a referral for home health to provide additional support after DC.  Consults: None  Significant Diagnostic Studies: No additional pertinent studies  Treatments: Surgery  Discharge Exam:  Alert and oriented.  Some pain and nausea this morning.   Left shoulder with dressing in place.  Ice pack in position, but not currently cooling.  Sling is loose.  Active motion in hand.  Sensation intact in the axillary patch.  Fingers are warm and well perfused.   Disposition: Discharge disposition: 01-Home or Self Care        Allergies as of 07/16/2020      Reactions   Hydrocodone Other (See  Comments)   Patient does not want again, Its makes her sick.   Codeine Nausea And Vomiting   Spironolactone    Significant weakness, cramping along lower extremities      Medication List    TAKE these medications   acetaminophen 500 MG tablet Commonly known as: TYLENOL Take 2 tablets (1,000 mg total) by mouth every 8 (eight) hours for 14 days.   albuterol 108 (90 Base) MCG/ACT inhaler Commonly known as: VENTOLIN HFA Inhale 2 puffs into the lungs every 6 (six) hours as needed for wheezing or shortness of breath.   aspirin EC 81 MG tablet Take 1 tablet (81 mg total) by mouth in the morning and at bedtime. What changed: when to take this   atorvastatin 80 MG tablet Commonly known as: LIPITOR Take 80 mg by mouth daily.   carvedilol 3.125 MG tablet Commonly known as: COREG Take 0.5 tablets (1.55 mg total) by mouth in the morning and at bedtime. What changed: how much to take   celecoxib 100 MG capsule Commonly known as: CeleBREX Take 1 capsule (100 mg total) by mouth daily for 14 days.   digoxin 0.125 MG tablet Commonly known as: LANOXIN Take 1 tablet (125 mcg total) by mouth daily.   diphenhydrAMINE 25 MG tablet Commonly known as: BENADRYL Take 25 mg by mouth at bedtime.   Entresto 24-26 MG Generic drug: sacubitril-valsartan TAKE 1 TABLET BY MOUTH 2 (TWO) TIMES DAILY. DUE FOR FOLLOW UP APPT What changed: See  the new instructions.   esomeprazole 40 MG capsule Commonly known as: NexIUM Take 1 capsule (40 mg total) by mouth daily.   estradiol 1 MG tablet Commonly known as: ESTRACE TAKE 1 TABLET BY MOUTH EVERY DAY   HYDROmorphone 2 MG tablet Commonly known as: Dilaudid Take 0.5 tablets (1 mg total) by mouth every 4 (four) hours as needed for up to 7 days for severe pain.   ibuprofen 200 MG tablet Commonly known as: ADVIL Take 400 mg by mouth every 6 (six) hours as needed for moderate pain.   Lidocaine 4 % Ptch Apply 1 patch topically daily as needed  (pain). What changed: Another medication with the same name was changed. Make sure you understand how and when to take each.   lidocaine 5 % Commonly known as: LIDODERM Place 1 patch onto the skin daily. What changed:   when to take this  reasons to take this   magic mouthwash Soln Take 10 mLs by mouth 4 (four) times daily as needed for mouth pain. What changed: reasons to take this   ondansetron 4 MG tablet Commonly known as: Zofran Take 1 tablet (4 mg total) by mouth every 8 (eight) hours as needed for up to 14 days for nausea or vomiting.   sodium chloride 0.65 % Soln nasal spray Commonly known as: OCEAN Place 1 spray into both nostrils as needed for congestion.   Systane Balance 0.6 % Soln Generic drug: Propylene Glycol Place 1 drop into both eyes daily as needed (dry eyes).   tolterodine 2 MG 24 hr capsule Commonly known as: DETROL LA Take 1 capsule (2 mg total) by mouth daily.       Follow-up Information    Mordecai Rasmussen, MD.   Specialties: Orthopedic Surgery, Sports Medicine Why: 10-14 days for suture removal/incision check Contact information: [address] [address] [phone]

## 2020-07-16 NOTE — Evaluation (Signed)
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  Clinical Impression  Patient limited for functional mobility as stated below secondary to BLE weakness, fatigue and poor standing balance. Began session as OT was finishing their session with patient. Patient seated in chair at beginning of session. Patient demonstrating fair sitting balance edge of chair with feet supported with c/o high pain levels despite just receiving pain medication before session. Patient apprehensive for mobility as she is fearful of pain and has c/o dizziness. Patient requires encouragement, cueing for sequencing/hemi walker use, and physical assist to transfer to standing with hemi walker used by RUE. Patient with wide base of support upon standing and is very unsteady requiring assist for standing balance and use of hemi walker. She demonstrates poor standing tolerance and stands for about 1 minute before being limited by fatigue, dizziness, and nausea. Patient ends session seated in chair.  Patient will benefit from continued physical therapy in hospital and recommended venue below to increase strength, balance, endurance for safe ADLs and gait.     Follow Up Recommendations SNF    Equipment Recommendations  None recommended by PT    Recommendations for Other Services       Precautions / Restrictions Precautions Precautions: Fall Required Braces or Orthoses: Sling Restrictions Weight Bearing Restrictions: Yes LUE Weight Bearing: Non weight bearing      Mobility  Bed Mobility Overal bed mobility: Needs Assistance Bed Mobility: Supine to Sit     Supine to sit: Min assist     General bed mobility comments: Patient seated in chair at beginning of session    Transfers Overall transfer level: Needs assistance Equipment used: Hemi-walker Transfers: Sit  to/from Stand Sit to Stand: Mod assist;Max assist        General transfer comment: Patient requires encouragement, cueing for sequencing, and assist to transfer to standing with hemi walker used by RUE  Ambulation/Gait                Stairs            Wheelchair Mobility    Modified Rankin (Stroke Patients Only)       Balance Overall balance assessment: Needs assistance Sitting-balance support: Feet supported Sitting balance-Leahy Scale: Fair Sitting balance - Comments: seated in chair     Standing balance-Leahy Scale: Poor Standing balance comment: with hemi walker                             Pertinent Vitals/Pain Pain Assessment: 0-10 Pain Score: 10-Worst pain ever Pain Location: L shoulder Pain Descriptors / Indicators: Aching;Grimacing;Guarding Pain Intervention(s): Limited activity within patient's tolerance;Monitored during session;Premedicated before session;Repositioned    Home Living Family/patient expects to be discharged to:: Private residence Living Arrangements: Spouse/significant other Available Help at Discharge: Family Type of Home: House Home Access: Stairs to enter Entrance Stairs-Rails: Can reach both Entrance Stairs-Number of Steps: 2 + 1 Home Layout: One level Home Equipment: Walker - 2 wheels;Other (comment) Management consultant)      Prior Function Level of Independence: Needs assistance   Gait / Transfers Assistance Needed: Patient states household ambulator without AD  ADL's / Homemaking Assistance Needed: husband assists  Comments: Pt reported that husband helps as needed with ADL tasks.     Hand Dominance   Dominant Hand: Right    Extremity/Trunk Assessment  Upper Extremity Assessment Upper Extremity Assessment: Defer to OT evaluation    Lower Extremity Assessment Lower Extremity Assessment: Generalized weakness    Cervical / Trunk Assessment Cervical / Trunk Assessment: Normal  Communication    Communication: No difficulties  Cognition Arousal/Alertness: Awake/alert Behavior During Therapy: Anxious Overall Cognitive Status: Within Functional Limits for tasks assessed                                 General Comments: Pt pain resulting in mild hesitancy with therapy tasks.      General Comments      Exercises     Assessment/Plan    PT Assessment Patient needs continued PT services  PT Problem List Decreased strength;Decreased mobility;Decreased activity tolerance;Decreased balance;Decreased knowledge of use of DME;Pain       PT Treatment Interventions DME instruction;Therapeutic exercise;Gait training;Balance training;Stair training;Neuromuscular re-education;Functional mobility training;Therapeutic activities;Patient/family education    PT Goals (Current goals can be found in the Care Plan section)  Acute Rehab PT Goals Patient Stated Goal: Get out of this place. PT Goal Formulation: With patient Time For Goal Achievement: 07/30/20 Potential to Achieve Goals: Good    Frequency Min 3X/week   Barriers to discharge        Co-evaluation               AM-PAC PT "6 Clicks" Mobility  Outcome Measure Help needed turning from your back to your side while in a flat bed without using bedrails?: A Little Help needed moving from lying on your back to sitting on the side of a flat bed without using bedrails?: A Little Help needed moving to and from a bed to a chair (including a wheelchair)?: A Lot Help needed standing up from a chair using your arms (e.g., wheelchair or bedside chair)?: A Lot Help needed to walk in hospital room?: A Lot Help needed climbing 3-5 steps with a railing? : A Lot 6 Click Score: 14    End of Session Equipment Utilized During Treatment: Gait belt Activity Tolerance: Patient limited by pain;Patient limited by fatigue Patient left: with chair alarm set;in chair;with call bell/phone within reach Nurse Communication: Mobility  status PT Visit Diagnosis: Unsteadiness on feet (R26.81);Other abnormalities of gait and mobility (R26.89);Muscle weakness (generalized) (M62.81);History of falling (Z91.81);Pain Pain - Right/Left: Left Pain - part of body: Shoulder    Time: 2423-5361 PT Time Calculation (min) (ACUTE ONLY): 29 min   Charges:   PT Evaluation $PT Eval Low Complexity: 1 Low PT Treatments $Therapeutic Activity: 8-22 mins        9:47 AM, 07/16/20 Mearl Latin PT, DPT Physical Therapist at [facility]

## 2020-07-16 NOTE — Evaluation (Signed)
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   Clinical Impression   Pt able to engage in therapy evaluation tasks but limited at time by pain and hesitation for functional ADL tasks and transfers. Possible cognitive deficits but difficult to assess due to use of pain meds. Pt required total assist to readjust L UE shoulder sling. Pt required Min A to complete bed mobility supine to sit and stand pivot transfer. Pt demonstrates lack of safety awareness. Pt recommended for further hospital OT services and 2x a wek. Recommended discharge to SNF at this time due assist level and lack of assist at home with reports of husband working during the day and no other assist.     Follow Up Recommendations  SNF    Equipment Recommendations  None recommended by OT (SNF)       Precautions / Restrictions Precautions Precautions: Fall Required Braces or Orthoses: Sling Restrictions Weight Bearing Restrictions: Yes LUE Weight Bearing: Non weight bearing      Mobility Bed Mobility Overal bed mobility: Needs Assistance Bed Mobility: Supine to Sit     Supine to sit: Min assist     General bed mobility comments: Patient seated in chair at beginning of session    Transfers Overall transfer level: Needs assistance Equipment used: Hemi-walker Transfers: Sit to/from Stand Sit to Stand: Mod assist;Max assist Stand pivot transfers: Min assist;From elevated surface       General transfer comment: Patient requires encouragement, cueing for sequencing, and assist to transfer to standing with hemi walker used by RUE                                               ADL either performed or assessed with clinical judgement   ADL Overall ADL's : Needs assistance/impaired                 Upper Body Dressing : Total  assistance;Sitting Upper Body Dressing Details (indicate cue type and reason): Total assist to adjust upper extremity sling while seated at EOB.     Toilet Transfer: Minimal assistance;Min Psychiatric nurse Details (indicate cue type and reason): Simulated to chair from EOB. Hesitant to transfer. Extended time and attempts.                 Vision Baseline Vision/History: No visual deficits           Hand Dominance Right   Extremity/Trunk Assessment Upper Extremity Assessment Upper Extremity Assessment: Defer to OT evaluation   Lower Extremity Assessment Lower Extremity Assessment: Generalized weakness   Cervical / Trunk Assessment Cervical / Trunk Assessment: Normal   Communication Communication Communication: No difficulties   Cognition Arousal/Alertness: Awake/alert Behavior During Therapy: Anxious Overall Cognitive Status: Within Functional Limits for tasks assessed                                 General Comments: Pt pain resulting in mild hesitancy with therapy tasks.                    Home Living Family/patient expects to be discharged to:: Private residence Living Arrangements: Spouse/significant other Available Help at Discharge: Family Type of Home:  House Home Access: Stairs to enter [employer] of Steps: 2 + 1 Entrance Stairs-Rails: Can reach both Home Layout: One level     Bathroom Shower/Tub: Teacher, early years/pre: Handicapped height     Home Equipment: Environmental consultant - 2 wheels;Other (comment) (reacher)          Prior Functioning/Environment Level of Independence: Needs assistance  Gait / Transfers Assistance Needed: Patient states household ambulator without AD ADL's / Homemaking Assistance Needed: husband assists   Comments: Pt reported that husband helps as needed with ADL tasks.        OT Problem List: Decreased strength;Decreased range of motion;Decreased activity tolerance;Impaired  balance (sitting and/or standing);Decreased safety awareness;Decreased knowledge of use of DME or AE;Impaired UE functional use;Pain      OT Treatment/Interventions: Self-care/ADL training;Therapeutic exercise;Energy conservation;DME and/or AE instruction;Manual therapy;Therapeutic activities;Patient/family education    OT Goals(Current goals can be found in the care plan section) Acute Rehab OT Goals Patient Stated Goal: Get out of this place. OT Goal Formulation: With patient Time For Goal Achievement: 07/30/20 Potential to Achieve Goals: Fair  OT Frequency: Min 2X/week   Barriers to D/C: Decreased caregiver support                                         End of Session Nurse Communication:  (Nursing provided pain meds during session.)  Activity Tolerance: Patient limited by pain Patient left: in chair;with chair alarm set;with nursing/sitter in room  OT Visit Diagnosis: Unsteadiness on feet (R26.81);Muscle weakness (generalized) (M62.81);Pain Pain - Right/Left: Left Pain - part of body: Shoulder (9/10 reported.)                Time: 0752-0830 OT Time Calculation (min): 38 min Charges:  OT General Charges $OT Visit: 1 Visit OT Evaluation $OT Eval Low Complexity: 1 Low  Porshea Janowski OT, MOT   Larey Seat 07/16/2020, 9:38 AM

## 2020-07-16 NOTE — Discharge Instructions (Signed)
Arth Nicastro A. Amedeo Kinsman, MD [facility] [address] [place],  [place]  [address] Phone: [phone] Fax: [phone]    [place] ? You may leave the operative dressing in place until your follow-up appointment. ? KEEP THE INCISIONS CLEAN AND DRY. ? There may be a small amount of fluid/bleeding leaking at the surgical site. This is normal after surgery.  ? If it fills with liquid or blood please call us immediately to change it for you. ? Use the provided ice machine or Ice packs as often as possible for the first 3-4 days, then as needed for pain relief.  Keep a layer of cloth or a shirt between your skin and the cooling unit to prevent frost bite as it can get very cold.  SHOWERING: - You may shower on Post-Op Day #2.  - The dressing is water resistant but do not scrub it as it may start to peel up.   - You may remove the sling for showering, but keep a water resistant pillow under the arm to keep both the  elbow and shoulder away from the body (mimicking the abduction sling).  - Gently pat the area dry.  - Do not soak the shoulder in water. Do not go swimming in the pool or ocean until your sutures are removed. - KEEP THE INCISIONS CLEAN AND DRY.  EXERCISES ? Wear the sling at all times except when doing your exercises. You may remove the sling for showering, but keep the arm across the chest or in a secondary sling.    ? Accidental/Purposeful External Rotation and shoulder flexion (reaching behind you) is to be avoided at all costs for the first month. ? It is ok to come out of your sling if your are sitting and have assistance for eating.  Do not lift anything heavier than 1 pound until we discuss it further in clinic. ? Please perform the exercises:    Elbow / Hand / Wrist  Range of Motion Exercises  Grip strengthening   REGIONAL ANESTHESIA (NERVE BLOCKS)  The anesthesia team may have  performed a nerve block for you if safe in the setting of your care.  This is a great tool used to minimize pain.  Typically the block may start wearing off overnight but the long acting medicine may last for 3-4 days.  The nerve block wearing off can be a challenging period but please utilize your as needed pain medications to try and manage this period.    POST-OP MEDICATIONS- Multimodal approach to pain control  In general your pain will be controlled with a combination of substances.  Prescriptions unless otherwise discussed are electronically sent to your pharmacy.  This is a carefully made plan we use to minimize narcotic use.     ? Meloxicam OR Celebrex - Anti-inflammatory medication taken on a scheduled basis ? Acetaminophen - Non-narcotic pain medicine taken on a scheduled basis  ? Dilaudid - This is a strong narcotic, to be used only on an as needed basis for pain. ? Aspirin 81mg  - This medicine is used to minimize the risk of blood clots after surgery. ? Omeprazole - daily medicine to protect your stomach while taking anti-inflammatories.  This may only be used in higher risk patients. ? Zofran -  take as needed for nausea  Meloxicam/Celebrex - these are anti-inflammatory and pain relievers.  Do not take additional ibuprofen, naproxen or other  NSAID while taking this medicine.   FOLLOW-UP ? If you develop a Fever (>101.5), Redness or Drainage from the surgical incision site, please call our office to arrange for an evaluation. ? Please call the office to schedule a follow-up appointment for a wound check, 10-14 days post-operatively.  IF YOU HAVE ANY QUESTIONS, PLEASE FEEL FREE TO CALL OUR OFFICE.  HELPFUL INFORMATION   If you had a block, it will wear off between 8-24 hrs postop typically.  This is period when your pain may go from nearly zero to the pain you would have had post-op without the block.  This is an abrupt transition but nothing dangerous is happening.  You may take  an extra dose of narcotic when this happens.  ? Your arm will be in a sling following surgery. You will be in this sling for the next 3-4 weeks.  I will let you know the exact duration at your follow-up visit.  ? You may be more comfortable sleeping in a semi-seated position the first few nights following surgery.  Keep a pillow propped under the elbow and forearm for comfort.  If you have a recliner type of chair it might be beneficial.  If not that is fine too, but it would be helpful to sleep propped up with pillows behind your operated shoulder as well under your elbow and forearm.  This will reduce pulling on the suture lines.  ? When dressing, put your operative arm in the sleeve first.  When getting undressed, take your operative arm out last.  Loose fitting, button-down shirts are recommended.  ? In most states it is against the law to drive while your arm is in a sling. And certainly against the law to drive while taking narcotics.  ? You may return to work/school in the next couple of days when you feel up to it. Desk work and typing in the sling is fine.  ? We suggest you use the pain medication the first night prior to going to bed, in order to ease any pain when the anesthesia wears off. You should avoid taking pain medications on an empty stomach as it will make you nauseous.  ? Do not drink alcoholic beverages or take illicit drugs when taking pain medications.  ? Pain medication may make you constipated.  Below are a few solutions to try in this order: - Decrease the amount of pain medication if you arent having pain. - Drink lots of decaffeinated fluids. - Drink prune juice and/or each dried prunes  o If the first 3 dont work start with additional solutions - Take Colace - an over-the-counter stool softener - Take Senokot - an over-the-counter laxative - Take Miralax - a stronger over-the-counter laxative   Dental Antibiotics:  In most cases prophylactic antibiotics  for Dental procdeures after total joint surgery are not necessary.  Exceptions are as follows:  1. History of prior total joint infection  2. Severely immunocompromised (Organ Transplant, cancer chemotherapy, Rheumatoid biologic meds such as Malone)  3. Poorly controlled diabetes (A1C &gt; 8.0, blood glucose over 200)  If you have one of these conditions, contact your surgeon for an antibiotic prescription, prior to your dental procedure.

## 2020-07-19 ENCOUNTER — Telehealth: Payer: Self-pay | Admitting: Orthopedic Surgery

## 2020-07-19 ENCOUNTER — Encounter (HOSPITAL_COMMUNITY): Payer: Self-pay | Admitting: Orthopedic Surgery

## 2020-07-19 DIAGNOSIS — S42292A Other displaced fracture of upper end of left humerus, initial encounter for closed fracture: Secondary | ICD-10-CM

## 2020-07-19 NOTE — Telephone Encounter (Signed)
This patient called to make her post operation follow up and wants to know if we can send her to home health to help her with some ADL's and learning to use her shoulder. She has an appointment on 07/30/2020. Please advise.

## 2020-07-20 NOTE — Telephone Encounter (Signed)
I sent a message to Octavia Bruckner Just[facility] for a follow up on this and waiting on a response back.

## 2020-07-20 NOTE — Telephone Encounter (Signed)
Thanks  This is frustrating.  I have personally sent multiple messages to the home health company, well before the actual surgery.  PT and OT recommended a SNF postop, but she adamantly refused.  While she was still in the hospital, we confirmed with the home health company that they had the referral and would follow up with the patient.  We may have to keep calling some people.   Elta Guadeloupe

## 2020-07-20 NOTE — Telephone Encounter (Signed)
Thanks Barnet Pall  I am not sure exactly what she is expecting.  The therapist can help her with some basic activities, but they are not going to help her with stuff around the house.  If she is expecting (or wanting) someone to be available for her at all times, she would have to consider hiring a private nurse or similar.  I am not sure how that works, or how we could help.  I do think there are agencies that can set it up thought.  This is something we can ask Amy or Dr. Aline Brochure about.   Elta Guadeloupe

## 2020-07-20 NOTE — Telephone Encounter (Signed)
I called the patient to follow up and she is having Physical Therapy and she reports that she wants to have a nurse or someone come out to help her with activities of daily living. Therapy is coming out to see this patient already.   I have placed the order for the home health. This is for you.

## 2020-07-21 NOTE — Telephone Encounter (Signed)
I usually just tell patient that is a service not covered under insurance/ Medicare. If they want someone they can contact an agency.

## 2020-07-22 NOTE — Telephone Encounter (Signed)
I called the patient and let her know that the referral has been placed for their office and they would call her to set up the home health cna.

## 2020-07-22 NOTE — Telephone Encounter (Signed)
I spoke with the patient and she does not want any one to come out and clean her house. She is agreeable to home health helping her with bathing and dressing. I let her know that I would make sure someone would call her to follow up about having home health help her at home. She did not have any questions.

## 2020-07-23 ENCOUNTER — Telehealth: Payer: Self-pay

## 2020-07-23 NOTE — Telephone Encounter (Signed)
Patient called asking for home health to come to her house. Patient # [phone].

## 2020-07-26 NOTE — Telephone Encounter (Signed)
I have left a message for [facility] to call the patient and they will call to set up her therapy.

## 2020-07-30 ENCOUNTER — Ambulatory Visit (INDEPENDENT_AMBULATORY_CARE_PROVIDER_SITE_OTHER): Payer: 59 | Admitting: Orthopedic Surgery

## 2020-07-30 ENCOUNTER — Encounter: Payer: Self-pay | Admitting: Orthopedic Surgery

## 2020-07-30 ENCOUNTER — Ambulatory Visit: Payer: 59

## 2020-07-30 ENCOUNTER — Other Ambulatory Visit: Payer: Self-pay

## 2020-07-30 DIAGNOSIS — S42292D Other displaced fracture of upper end of left humerus, subsequent encounter for fracture with routine healing: Secondary | ICD-10-CM

## 2020-07-30 MED ORDER — HYDROMORPHONE HCL 2 MG PO TABS: 1.0000 mg | ORAL_TABLET | Freq: Four times a day (QID) | ORAL | 0 refills | Status: AC | PRN

## 2020-07-30 NOTE — Progress Notes (Signed)
Orthopaedic Postop Note  Assessment: Sherry Bender is a 70 y.o. female s/p left shoulder reverse arthroplasty for a proximal humerus fracture  DOS: 07/15/2020  Plan: Patient doing well overall.  Sutures were trimmed, and Steri-Strips were placed. Provided a new prescription for Dilaudid. Advised her to limit motion of the shoulder, but she can work on general passive motion.  She can also work on range of motion in her left elbow, wrist and fingers. Continue to use the sling when ambulating. Agree with plan for continued home health. In 2 weeks she can begin passive range of motion of her shoulder to the level of the shoulder.  Follow-up 4 weeks  Meds ordered this encounter  Medications  . HYDROmorphone (DILAUDID) 2 MG tablet    Sig: Take 0.5 tablets (1 mg total) by mouth every 6 (six) hours as needed for up to 7 days for severe pain.    Dispense:  14 tablet    Refill:  0     Follow-up: Return in about 4 weeks (around 08/27/2020). XR at next visit: Left shoulder  Subjective:  Chief Complaint  Patient presents with  . Shoulder Injury    DOS 07/15/20.     History of Present Illness: Sherry Bender is a 70 y.o. female who presents following the above stated procedure.  Overall, she is progressing well.  She continues have some pain, but this is controlled with pain medications.  She is primarily been using this at night.  Home health has been working with her, although her range of motion is limited.  She continues to use the sling at all times, especially at night.  No numbness or tingling.  No fevers or chills.  Review of Systems: No fevers or chills No numbness or tingling No Chest Pain No shortness of breath   Objective: LMP 02/28/1999   Physical Exam:  Surgical incision is healing well, without surrounding erythema or drainage.  She has some mild swelling, medial to the surgical incision toward the axilla.  She also has some swelling in the medial aspect of her  left elbow.  Diffuse swelling down her left upper extremity.  Fingers are warm and well-perfused.  Sensation is intact.  She tolerates passive forward flexion to approximately 45 degrees without discomfort.  Passive abduction to approximately 30 degrees.  IMAGING: I personally ordered and reviewed the following images:  X-rays of the left shoulder demonstrates good positioning of the prosthesis.  There is been no interval displacement or failure of the hardware.  No evidence that the lesser tuberosity is not healing.  Impression: Cemented left reverse shoulder arthroplasty in good position.   Sherry Rasmussen, MD 07/30/2020 1:12 PM

## 2020-08-05 ENCOUNTER — Encounter: Payer: Self-pay | Admitting: Orthopedic Surgery

## 2020-08-11 ENCOUNTER — Other Ambulatory Visit: Payer: Self-pay | Admitting: Cardiology

## 2020-08-11 ENCOUNTER — Other Ambulatory Visit: Payer: Self-pay | Admitting: Student

## 2020-08-16 ENCOUNTER — Telehealth: Payer: Self-pay | Admitting: Orthopedic Surgery

## 2020-08-16 NOTE — Telephone Encounter (Signed)
Received a call from [place] at [facility]. She wanted to have the patient to have physical therapy once a week for her shoulder and then transition to Occupational Therapy. I gave a verbal for this and we will get a fax of her progress. FYI.

## 2020-08-18 ENCOUNTER — Telehealth: Payer: Self-pay | Admitting: Orthopedic Surgery

## 2020-08-18 NOTE — Telephone Encounter (Signed)
I called the patient to clarify and the number for [facility] and the number was not correct.  The patient is aware that she will have physical therapy and occupational therapy will keep working with her and she is appreciative of the call.

## 2020-08-18 NOTE — Telephone Encounter (Signed)
Katrena called and said that Sharyn Lull from [facility] wants her to now have OT.  I told her that it appears that Dr. Amedeo Kinsman was already aware of this.  She asks that someone call Sharyn Lull to give this approval.  Michelle's phone number is [phone]  Can you give Sharyn Lull a call please?  Thanks

## 2020-08-31 ENCOUNTER — Other Ambulatory Visit: Payer: Self-pay | Admitting: Cardiology

## 2020-09-01 ENCOUNTER — Other Ambulatory Visit: Payer: Self-pay

## 2020-09-01 ENCOUNTER — Other Ambulatory Visit: Payer: Self-pay | Admitting: Cardiology

## 2020-09-01 ENCOUNTER — Telehealth: Payer: Self-pay | Admitting: Orthopedic Surgery

## 2020-09-01 MED ORDER — DIGOXIN 125 MCG PO TABS: 125.0000 ug | ORAL_TABLET | Freq: Every day | ORAL | 3 refills | Status: DC

## 2020-09-01 NOTE — Telephone Encounter (Signed)
Refilled digoxin 0.125 mg qd 

## 2020-09-01 NOTE — Telephone Encounter (Signed)
09/01/20  Per Dr. Amedeo Kinsman verbal request, I called Mrs. Hassell Done and asked her about scheduling a follow up.  She said she hadn't asked her husband again about bringing her here for an appointment but promised she would do this tonight.  She said she will call back before Friday.  I gave her my name and she is to ask for me.   08/27/20  I called and spoke with Mrs. Hinckley. She said there is no way she can come into the office anytime soon. She said that her husband can't take off any more time from work. She asked if Dr. Amedeo Kinsman could just call her on the phone. I told her that I thought he wanted to see her in person. She is going to speak to her husband to see if and when he thinks he might could bring her in. He will give [place] a call to schedule an appointment.          Previous Messages   ----- Message -----  From: Mordecai Rasmussen, MD  Sent: 08/27/2020  8:46 AM EST  To: Tasia Catchings, CMA, [facility] [place]   Good morning   It does not look like this patient has another follow up scheduled. We discussed it at the last appointment, but she did not finalize a date for her return.    Could someone contact her to schedule an appointment, and please let her know I would like to see her and see how she is doing.   Please be flexible with her date and time, she is always concerned about her husband taking time off work.   Thanks   [employer]

## 2020-09-10 ENCOUNTER — Encounter: Payer: Self-pay | Admitting: Orthopedic Surgery

## 2020-09-10 ENCOUNTER — Ambulatory Visit (INDEPENDENT_AMBULATORY_CARE_PROVIDER_SITE_OTHER): Payer: 59 | Admitting: Orthopedic Surgery

## 2020-09-10 ENCOUNTER — Other Ambulatory Visit: Payer: Self-pay

## 2020-09-10 ENCOUNTER — Ambulatory Visit: Payer: 59

## 2020-09-10 VITALS — BP 118/68 | HR 82

## 2020-09-10 DIAGNOSIS — S42292D Other displaced fracture of upper end of left humerus, subsequent encounter for fracture with routine healing: Secondary | ICD-10-CM

## 2020-09-10 NOTE — Progress Notes (Signed)
Orthopaedic Postop Note  Assessment: Sherry Bender is a 70 y.o. female s/p left shoulder reverse arthroplasty for a proximal humerus fracture; complicated by poor motor function of LUE  DOS: 07/15/2020  Plan: Sherry Bender is not having any pain in her shoulder at this time.  She has full sensation throughout her left arm.  However, she has significant motor deficits at this time, with very gradual, if any improvements.  Complicating matters at this time is the fact that she does not remember how much function she had in her left arm prior to surgery.  However, she does think that she had much better movement in her left hand before surgery for her more recent fall.   It is possible that she has a nerve injury from surgery, currently affecting multiple nerves in her left arm.  It is possible that the nerves could continue to recover, and can take several months.  She is hopeful this will improve.   I recommended obtaining an EMG to evaluate the nerves in her left arm, but she adamantly refused, in part because of her history of breast cancer and because she does not want her husband to take time off of work for another appointment.    At this point in time, I am hopeful that she will see a gradual improvement in her mobility, but I cannot guarantee this.  We can consider a referral to neurology in the future if she is willing to consider this.  She also wants to limit the number of home health therapy sessions per week and she feels there are too many therapists coming to her house on a regular basis.  She prefers to continue with OT, and not as much PT.  This will be up to her to discuss with the therapists directly.   Continue to work on motion and pain control.  Call with problems.  I will contact her in 1 month for a phone visit.    Follow-up: Return for Phone visit in approximately 1 month. XR at next visit: Left shoulder  Subjective:  Chief Complaint  Patient presents with  . Arm Pain     L/arm follow up. 07/15/20.    History of Present Illness: Sherry Bender is a 70 y.o. female who presents following the above stated procedure.  This is her second postop follow-up, as it was difficult for her to return approximately 1-2 weeks ago for her 6-week follow-up.  She states her shoulder feels fine.  She does not have any pain in her shoulder.  However, she continues to have difficulty using the left arm.  Specifically, she is unable to lift her arm by herself.  She also notes that she cannot flex her elbow nor can she use her fingers as expected.  She is unable to make a fist.  She has been working with home health, and they have expressed some concerns with her usual physical exam findings.  She does not have any numbness or tingling at this time.  Although she notes she had little function prior to surgery, she cannot remember how much she is able to do with her left arm.  Review of Systems: No fevers or chills No numbness or tingling No Chest Pain No shortness of breath   Objective: BP 118/68   Pulse 82   LMP 02/28/1999   Physical Exam:  Surgical incision is healing well.  No surrounding erythema or drainage.   Mild tenderness in the posterior upper arm.   Sensation  is intact in the lateral shoulder, anterior arm, medial and lateral forearm, superficial radial nerve distribution, median nerve and ulnar nerve distribution.  Fingers are warm and well perfused  She can extend her elbow, wrist and fingers.    Unable to abduct the arm, flex her elbow and fingers.    IMAGING: I personally ordered and reviewed the following images:  X-rays of the left shoulder demonstrates good positioning of the prosthesis.  There is been no interval displacement or failure of the hardware.  No evidence that the lesser tuberosity is not healing.  Impression: Cemented left reverse shoulder arthroplasty in good position.   Mordecai Rasmussen, MD 09/10/2020 1:28 PM

## 2020-09-10 NOTE — Patient Instructions (Signed)
We discussed multiple issues with your left arm today.  It is unclear how much function you had in your left arm and shoulder prior to surgery.  You do not remember having any numbness and tingling in you left hand prior to surgery, or since surgery.  Full sensation intact over the lateral shoulder, anterior upper arm, medial and lateral forearm, in the superficial radial nerve, median nerve and ulnar nerve  Unable to abduct her shoulder, unable to flex her elbow, or flex her fingers.  You can extend your elbow, wrist and fingers.    It is possible that the motor function of these nerves will recover.  Sometimes this takes time to fully recover.  We discussed evaluation by a neurologist and consideration for an EMG, but not at this time.

## 2020-09-23 ENCOUNTER — Telehealth: Payer: Self-pay

## 2020-09-23 NOTE — Telephone Encounter (Signed)
Called pt and gave her the information for Dr. Merlene Laughter and [facility]. Pt states that she will speak with her husband and call us back with her decision.

## 2020-09-23 NOTE — Telephone Encounter (Signed)
-----   Message from Mordecai Rasmussen, MD sent at 09/23/2020 10:42 AM EDT ----- Sherry Bender  [name] had left reverse shoulder arthroplasty for a fracture at the end of January.  I saw her in clinic 2 weeks ago and unfortunately, she is having issues with some of her nerves not responding, which is limiting her function and recovery.  I have recommended an evaluation by a neurologist.  I just spoke with her again and she is asking [place] to let her know who we would refer her to go and see.    A quick note, she is reluctant to schedule any additional visits because she does not want her husband to have to take time off of work.    Regardless, could you call her and let her know who we typically use as a referral for neurology?  Once you have talked to her, please place the referral as urgent, and let her know that it can take some time to get it scheduled and we should get everything initiated.    Please let me know if you have any questions.  You can call or text me any time.   Mark  [phone]

## 2020-10-08 ENCOUNTER — Encounter: Payer: Self-pay | Admitting: Student

## 2020-10-08 ENCOUNTER — Other Ambulatory Visit: Payer: Self-pay

## 2020-10-08 ENCOUNTER — Ambulatory Visit (INDEPENDENT_AMBULATORY_CARE_PROVIDER_SITE_OTHER): Payer: 59 | Admitting: Student

## 2020-10-08 VITALS — BP 132/76 | HR 78 | Ht 64.0 in | Wt 92.6 lb

## 2020-10-08 DIAGNOSIS — I2510 Atherosclerotic heart disease of native coronary artery without angina pectoris: Secondary | ICD-10-CM

## 2020-10-08 DIAGNOSIS — I428 Other cardiomyopathies: Secondary | ICD-10-CM | POA: Diagnosis not present

## 2020-10-08 DIAGNOSIS — E785 Hyperlipidemia, unspecified: Secondary | ICD-10-CM

## 2020-10-08 MED ORDER — ASPIRIN EC 81 MG PO TBEC: 81.0000 mg | DELAYED_RELEASE_TABLET | Freq: Every day | ORAL | 3 refills | Status: AC

## 2020-10-08 NOTE — Progress Notes (Signed)
Cardiology Office Note    Date:  10/08/2020   ID:  Sherry Bender, DOB August 04, 1950, MRN [id number]  PCP:  Sharion Balloon, FNP  Cardiologist: Rozann Lesches, MD    Chief Complaint  Patient presents with  . Follow-up    Routine Visit    History of Present Illness:    Sherry Bender is a 70 y.o. female with past medical history of chronic systolic CHF (EF 25 to 30% by echo in 02/2019,at 30-35% by repeat echo in 05/2019), CAD (s/p coronary CT in 08/2019 showing moderate CAD but not significant by FFR), history of breast cancer (s/p mastectomy and radiation)and HLD who presents to the office today for overdue follow-up.   She was last examined by myself in 01/2020 and reported having baseline dyspnea on exertion but this had improved since her prior visit. She described occasional episodes of chest pain lasting for seconds at a time but no exertional pain. She was continued on her current medication regimen and it was recommended she have a follow-up echocardiogram for reassessment of her EF. This showed her pumping function had normalized to 55 to 60%.  In talking with the patient today, she reports having fractured her left humerus in 05/2020 following a mechanical fall and underwent surgical repair in January of this year but continues to have issues with lifting her arm and has been referred to neurosurgery. Her husband helps with chores around the house and helps with getting her dressed in the mornings due to this.  She has baseline dyspnea on exertion but says this has overall improved over the past few years. Denies any associated chest pain or palpitations. No recent orthopnea, PND or lower extremity edema.  She does not add salt to her food but does get takeout from local restaurants frequently.   Past Medical History:  Diagnosis Date  . Anxiety   . Arthritis   . Breast cancer ([place]) 2016  . Cardiomyopathy ([place])    a. EF 25-30% by echocardiogram in 02/2019 b. 30-35% in 05/2019  c. normalized to 55-60% by echo in 02/2020  . Coronary atherosclerosis    Cardiac CTA with negative FFR, nonobstructive March 2021  . Cricopharyngeus muscle dysfunction   . Gastric ulcer   . GERD (gastroesophageal reflux disease)   . Hiatal hernia   . Hyperlipidemia   . Hypertension   . PONV (postoperative nausea and vomiting)   . Scoliosis   . Scoliosis   . Skin cancer   . Urinary frequency     Past Surgical History:  Procedure Laterality Date  . ABDOMINAL HYSTERECTOMY  2000   TAH/BSO  . ANTERIOR CERVICAL DECOMP/DISCECTOMY FUSION N/A 06/18/2014   Procedure: C4-5 C5-6 C6-7 ANTERIOR CERVICAL DECOMPRESSION/DISKECTOMY/FUSION;  Surgeon: Eustace Moore, MD;  Location: [facility];  Service: Neurosurgery;  Laterality: N/A;  C4-5 C5-6 C6-7 ANTERIOR CERVICAL DECOMPRESSION/DISKECTOMY/FUSION  . ANTERIOR CERVICAL DECOMP/DISCECTOMY FUSION N/A 06/25/2014   Procedure: EXPLORATION OF CERVICAL FUSION WITH REVIOSN OF HARDWARE;  Surgeon: Eustace Moore, MD;  Location: [facility];  Service: Neurosurgery;  Laterality: N/A;  . BIOPSY  01/01/2020   Procedure: BIOPSY;  Surgeon: Mauri Pole, MD;  Location: [facility];  Service: Endoscopy;;  . CERVICAL FUSION    . CHOLECYSTECTOMY  1994  . COMPLETE MASTECTOMY W/ SENTINEL NODE BIOPSY Left 04/30/2015  . ESOPHAGOGASTRODUODENOSCOPY (EGD) WITH PROPOFOL N/A 01/01/2020   Procedure: ESOPHAGOGASTRODUODENOSCOPY (EGD) WITH PROPOFOL;  Surgeon: Mauri Pole, MD;  Location: [facility];  Service: Endoscopy;  Laterality: N/A;  .  FOOT NEUROMA SURGERY  01/2010   also had metatarasl shortened  . KNEE ARTHROSCOPY  2005   right knee  . MASTECTOMY Left 2016  . PORT-A-CATH REMOVAL Right 04/30/2015   Procedure: REMOVAL PORT-A-CATH;  Surgeon: Claud Kelp, MD;  [facility];  Service: General;  Laterality: Right;  . PORTACATH PLACEMENT N/A 11/23/2014   Procedure: INSERTION PORT-A-CATH WITH ULTRASOUND;  Surgeon: Claud Kelp, MD;  Location: [facility];  Service: General;  Laterality: N/A;  . REVERSE SHOULDER ARTHROPLASTY Left 07/15/2020   Procedure: REVERSE SHOULDER ARTHROPLASTY;  Surgeon: Oliver Barre, MD;  Location: [facility];  Service: Orthopedics;  Laterality: Left;  . RHINOPLASTY    . SIMPLE MASTECTOMY WITH AXILLARY SENTINEL NODE BIOPSY Left 04/30/2015   Procedure: LEFT TOTAL  MASTECTOMY WITH LEFT AXILLARY SENTINEL NODE BIOPSY;  Surgeon: Claud Kelp, MD;  Location: [facility];  Service: General;  Laterality: Left;    Current Medications: Outpatient Medications Prior to Visit  Medication Sig Dispense Refill  . albuterol (VENTOLIN HFA) 108 (90 Base) MCG/ACT inhaler Inhale 2 puffs into the lungs every 6 (six) hours as needed for wheezing or shortness of breath.    Marland Kitchen atorvastatin (LIPITOR) 20 MG tablet TAKE 1 TABLET BY MOUTH EVERY DAY 90 tablet 1  . carvedilol (COREG) 3.125 MG tablet Take 0.5 tablets (1.55 mg total) by mouth in the morning and at bedtime. (Patient taking differently: Take 1.563 mg by mouth in the morning and at bedtime.)    . diphenhydrAMINE (BENADRYL) 25 MG tablet Take 25 mg by mouth at bedtime.    Marland Kitchen ENTRESTO 24-26 MG TAKE 1 TABLET BY MOUTH 2 (TWO) TIMES DAILY. DUE FOR FOLLOW UP APPT (Patient taking differently: Take 1 tablet by mouth 2 (two) times daily.) 180 tablet 3  . esomeprazole (NEXIUM) 40 MG capsule Take 1 capsule (40 mg total) by mouth daily. 90 capsule 4  . estradiol (ESTRACE) 1 MG tablet TAKE 1 TABLET BY MOUTH EVERY DAY 90 tablet 1  . ibuprofen (ADVIL) 200 MG tablet Take 400 mg by mouth every 6 (six) hours as needed for moderate pain.    Marland Kitchen lidocaine (LIDODERM) 5 % Place 1 patch onto the skin daily. (Patient taking differently: Place 1 patch onto the skin daily as needed (pain).) 30 patch 2  . Lidocaine 4 % PTCH Apply 1 patch topically daily as needed (pain).    . Propylene Glycol (SYSTANE BALANCE) 0.6 % SOLN Place 1 drop into both eyes daily as needed (dry eyes).    . sodium chloride (OCEAN) 0.65 % SOLN nasal  spray Place 1 spray into both nostrils as needed for congestion.    Marland Kitchen tolterodine (DETROL LA) 2 MG 24 hr capsule Take 1 capsule (2 mg total) by mouth daily. 90 capsule 3  . digoxin (LANOXIN) 0.125 MG tablet Take 1 tablet (125 mcg total) by mouth daily. 90 tablet 3  . magic mouthwash SOLN Take 10 mLs by mouth 4 (four) times daily as needed for mouth pain. (Patient taking differently: Take 10 mLs by mouth 4 (four) times daily as needed (esophagus pain).) 450 mL 2   No facility-administered medications prior to visit.     Allergies:   Hydrocodone, Codeine, and Spironolactone   Social History   Socioeconomic History  . Marital status: Married    Spouse name: Not on file  . Number of children: 2  . Years of education: Not on file  . Highest education level: Not on file  Occupational History  .  Not on file  Tobacco Use  . Smoking status: Former Smoker    Packs/day: 0.25    Years: 42.00    Pack years: 10.50    Types: Cigarettes    Quit date: 01/24/2017    Years since quitting: 3.7  . Smokeless tobacco: Never Used  Vaping Use  . Vaping Use: Never used  Substance and Sexual Activity  . Alcohol use: No  . Drug use: No  . Sexual activity: Never  Other Topics Concern  . 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     Two daughters born 72 and 31- one in [place] city one in [place].             Social Determinants of Health   Financial Resource Strain: Not on file  Food Insecurity: Not on file  Transportation Needs: Not on file  Physical Activity: Not on file  Stress: Not on file  Social Connections: Not on file     Family History:  The patient's family history includes Cancer in her maternal grandmother and paternal aunt; Diabetes in her mother; Heart disease in her father and mother; Hypertension in her father and mother.   Review of  Systems:   Please see the history of present illness.     General:  No chills, fever, night sweats or weight changes.  Cardiovascular:  No chest pain, dyspnea on exertion, edema, orthopnea, palpitations, paroxysmal nocturnal dyspnea. Positive for dyspnea on exertion (improved).  Dermatological: No rash, lesions/masses Respiratory: No cough, dyspnea Urologic: No hematuria, dysuria Abdominal:   No nausea, vomiting, diarrhea, bright red blood per rectum, melena, or hematemesis Neurologic:  No visual changes, wkns, changes in mental status. All other systems reviewed and are otherwise negative except as noted above.   Physical Exam:    VS:  BP 132/76   Pulse 78   Ht 5\' 4"  (1.626 m)   Wt 92 lb 9.6 oz (42 kg)   LMP 02/28/1999   SpO2 98%   BMI 15.89 kg/m    General: Thin female appearing in no acute distress. Head: Normocephalic, atraumatic. Neck: No carotid bruits. JVD not elevated.  Lungs: Respirations regular and unlabored, without wheezes or rales.  Heart: Regular rate and rhythm. No S3 or S4.  No murmur, no rubs, or gallops appreciated. Abdomen: Appears non-distended. No obvious abdominal masses. Msk:  Strength and tone appear normal for age. No obvious joint deformities or effusions. Extremities: No clubbing or cyanosis. No lower extremity edema.  Distal pedal pulses are 2+ bilaterally. Neuro: Alert and oriented X 3. Moves all extremities spontaneously. No focal deficits noted. Psych:  Responds to questions appropriately with a normal affect. Skin: No rashes or lesions noted  Wt Readings from Last 3 Encounters:  10/08/20 92 lb 9.6 oz (42 kg)  06/29/20 91 lb 0.8 oz (41.3 kg)  06/22/20 91 lb 0.8 oz (41.3 kg)     Studies/Labs Reviewed:   EKG:  EKG is not ordered today.   Recent Labs: 06/29/2020: BUN 18; Creatinine, Ser <0.30; Potassium 4.6; Sodium 131 07/15/2020: Hemoglobin 9.4; Platelets 134   Lipid Panel    Component Value Date/Time   CHOL 138 09/12/2019 1518   CHOL  200 (H) 12/26/2018 1525   TRIG 58 09/12/2019 1518   HDL 68 09/12/2019 1518   HDL 93 12/26/2018 1525  CHOLHDL 2.0 09/12/2019 1518   VLDL 12 09/12/2019 1518   LDLCALC 58 09/12/2019 1518   LDLCALC 90 12/26/2018 1525    Additional studies/ records that were reviewed today include:   Echocardiogram: 02/2020 IMPRESSIONS    1. Left ventricular ejection fraction, by estimation, is 55 to 60%. The  left ventricle has normal function. The left ventricle has no regional  wall motion abnormalities. Left ventricular diastolic parameters are  indeterminate.  2. Right ventricular systolic function is normal. The right ventricular  size is normal. Tricuspid regurgitation signal is inadequate for assessing  PA pressure.  3. The mitral valve is grossly normal. Trivial mitral valve  regurgitation.  4. The aortic valve is tricuspid. Aortic valve regurgitation is not  visualized.  5. The inferior vena cava is normal in size with greater than 50%  respiratory variability, suggesting right atrial pressure of 3 mmHg.   Coronary CT: 08/2019  1. Left Main:  No significant stenosis. FFR = 0.92  2. LAD: No significant stenosis. Proximal FFR = 0.91, Mid FFR = 0.89, Distal FFR = 0.84 3. LCX: No significant stenosis. Proximal FFR = 0.95, Distal FFR = 0.85, OM = 0.91 4. RCA: No significant stenosis. Proximal FFR = 0.91, Mid FFR = 0.89, Distal FFR = 0.85  IMPRESSION: 1.  CT FFR analysis didn't show any significant stenosis.  RECOMMENDATIONS: Goal directed medical therapy for secondary prevention of CAD. Aggressive risk factor modification.   Assessment:    1. Nonischemic cardiomyopathy ([place])   2. Coronary artery disease involving native coronary artery of native heart without angina pectoris   3. Hyperlipidemia LDL goal <70      Plan:   In order of problems listed above:  1. History of Nonischemic Cardiomyopathy - Her EF was previously at 25 to 30% by echocardiogram in 02/2019  but has normalized to 55 to 60% by recent imaging in 02/2020.  - She denies any recent orthopnea, PND or lower extremity edema and appears euvolemic by examination today. Will continue Coreg 1.55 mg twice daily and Entresto 24-26 mg twice daily. Will stop Digoxin given normalization of her EF. No longer requiring diuretic therapy.   2. CAD - She had moderate CAD by Coronary CT in 08/2019 but was not significant by FFR. She reports her dyspnea has improved and she denies any recent chest pain. Continue Coreg and Atorvastatin 20 mg daily. I did recommend that she restart ASA 81 mg daily.  3.  HLD - FLP in 08/2019 showed total cholesterol 138, triglycerides 58, HDL 68 and LDL 58. At goal of LDL less than 70. She remains on Atorvastatin 20 mg daily.   Medication Adjustments/Labs and Tests Ordered: Current medicines are reviewed at length with the patient today.  Concerns regarding medicines are outlined above.  Medication changes, Labs and Tests ordered today are listed in the Patient Instructions below. Patient Instructions  Medication Instructions:  Your physician has recommended you make the following change in your medication:   Stop Digoxin  Restart Aspirin 81 mg Daily   *If you need a refill on your cardiac medications before your next appointment, please call your pharmacy*   Lab Work: NONE   If you have labs (blood work) drawn today and your tests are completely normal, you will receive your results only by: Marland Kitchen MyChart Message (if you have MyChart) OR . A paper copy in the mail If you have any lab test that is abnormal or we need to change your treatment, we will call  you to review the results.   Testing/Procedures: NONE    Follow-Up: At [facility], you and your health needs are our priority.  As part of our continuing mission to provide you with exceptional heart care, we have created designated Provider Care Teams.  These Care Teams include your primary Cardiologist  (physician) and Advanced Practice Providers (APPs -  Physician Assistants and Nurse Practitioners) who all work together to provide you with the care you need, when you need it.  We recommend signing up for the patient portal called "MyChart".  Sign up information is provided on this After Visit Summary.  MyChart is used to connect with patients for Virtual Visits (Telemedicine).  Patients are able to view lab/test results, encounter notes, upcoming appointments, etc.  Non-urgent messages can be sent to your provider as well.   To learn more about what you can do with MyChart, go to [email or web address].    Your next appointment:   6 month(s)  The format for your next appointment:   In Person  Provider:   Rozann Lesches, MD   Other Instructions Thank you for choosing [facility]!       Signed, Erma Heritage, PA-C  10/08/2020 2:43 PM    [place] [address] [place], [place] [address] Phone: [phone] Fax: [phone]

## 2020-10-08 NOTE — Patient Instructions (Signed)
Medication Instructions:  Your physician has recommended you make the following change in your medication:   Stop Digoxin  Restart Aspirin 81 mg Daily   *If you need a refill on your cardiac medications before your next appointment, please call your pharmacy*   Lab Work: NONE   If you have labs (blood work) drawn today and your tests are completely normal, you will receive your results only by: Marland Kitchen MyChart Message (if you have MyChart) OR . A paper copy in the mail If you have any lab test that is abnormal or we need to change your treatment, we will call you to review the results.   Testing/Procedures: NONE    Follow-Up: At [facility], you and your health needs are our priority.  As part of our continuing mission to provide you with exceptional heart care, we have created designated Provider Care Teams.  These Care Teams include your primary Cardiologist (physician) and Advanced Practice Providers (APPs -  Physician Assistants and Nurse Practitioners) who all work together to provide you with the care you need, when you need it.  We recommend signing up for the patient portal called "MyChart".  Sign up information is provided on this After Visit Summary.  MyChart is used to connect with patients for Virtual Visits (Telemedicine).  Patients are able to view lab/test results, encounter notes, upcoming appointments, etc.  Non-urgent messages can be sent to your provider as well.   To learn more about what you can do with MyChart, go to [email or web address].    Your next appointment:   6 month(s)  The format for your next appointment:   In Person  Provider:   Rozann Lesches, MD   Other Instructions Thank you for choosing [facility]!

## 2020-10-15 ENCOUNTER — Encounter: Payer: Self-pay | Admitting: Orthopedic Surgery

## 2020-10-15 ENCOUNTER — Other Ambulatory Visit: Payer: Self-pay

## 2020-10-15 ENCOUNTER — Ambulatory Visit (INDEPENDENT_AMBULATORY_CARE_PROVIDER_SITE_OTHER): Payer: 59 | Admitting: Orthopedic Surgery

## 2020-10-15 DIAGNOSIS — S42292D Other displaced fracture of upper end of left humerus, subsequent encounter for fracture with routine healing: Secondary | ICD-10-CM

## 2020-10-15 NOTE — Progress Notes (Signed)
[facility] Return - Virtual Telephone Visit   **Visit was conducted via telephone at the patient's request.  No vital signs or physical exam were completed.  All previous medical records were readily available during my conversation with the patient.  In total, I was on the phone with the patient for 15 minutes**   Location: Patient: Home Provider: Clinic  [phone] - 11-20 minutes   Assessment: Sherry Bender is a 70 y.o. female with the following: Left reverse shoulder arthroplasty for fracture, date of surgery 07/15/2020  Plan: She has healed following surgery.  Her pain has improved in the shoulder.  No issues with her surgical incisions.  However, she continues to have loss of function, including limited use of her hand, elbow and shoulder.  She explicitly denies numbness or tingling in her left upper extremity.  I have repeatedly discussed a referral to neurology for further evaluation of the left upper extremity.  She remains reluctant as her husband will have to take more time off work.  I was very clear in discussing this with the patient and told her that I am not sure what is going on.  Further evaluation by neurology could provide further clues regarding her condition, as well as potential treatment options.  It is possible, that if we continue to delay in further evaluations, the postoperative symptoms that she is currently experiencing could become permanent.  She would like to continue working with therapy, and be given more time to see if her symptoms resolve.  I would like her to remain active, but I am also aware that she needs to experience some improvement or benefit from ongoing therapy.  We will touch base with the patient within the next 1-2 weeks to discuss the possibility of her excepting a neurology referral.   Follow-up: Return for After EMG.   Subjective:  Chief Complaint  Patient presents with  . f/u Left reverse shoulder arthroplasty    History of Present  Illness: I connected with  Sherry Bender on 10/15/20 by a video enabled telemedicine application and verified that I am speaking with the correct person using two identifiers.   I discussed the limitations of evaluation and management by telemedicine. The patient expressed understanding and agreed to proceed.   Sherry Bender is a 70 y.o. female who now approximately 3 months out from a left reverse shoulder arthroplasty for a proximal humerus fracture.  Her immediate postoperative course was without issue.  She continues to have significant deficits in the function of her left hand and upper extremity.  She is not having any numbness or tingling.  She is doing exercises on her own.  She states that things have not really changed since the last time we discussed her left arm.  She has not decided on any neurology referral yet.  She remains reluctant as her husband is unable to take time off work.  She states that even if she went to a neurologist, she would not proceed with recommendations regarding testing or further imaging.  Review of Systems: No fevers or chills No numbness or tingling No chest pain No shortness of breath No bowel or bladder dysfunction No GI distress No headaches    Objective: No vital signs  Physical Exam:  Telephone consult - No exam   IMAGING: I personally ordered and reviewed the following images:  No new imaging today  Mordecai Rasmussen, MD 10/15/2020 11:40 PM

## 2020-10-19 ENCOUNTER — Telehealth: Payer: Self-pay

## 2020-10-19 DIAGNOSIS — S42292D Other displaced fracture of upper end of left humerus, subsequent encounter for fracture with routine healing: Secondary | ICD-10-CM

## 2020-10-19 NOTE — Telephone Encounter (Signed)
Spoke with pt and she agrees to a referral being placed to Dr. Freddie Apley office. Referral placed and faxed with clinical information.

## 2020-11-01 ENCOUNTER — Telehealth: Payer: Self-pay

## 2020-11-01 ENCOUNTER — Telehealth: Payer: Self-pay | Admitting: Student

## 2020-11-01 DIAGNOSIS — J9600 Acute respiratory failure, unspecified whether with hypoxia or hypercapnia: Secondary | ICD-10-CM

## 2020-11-01 NOTE — Telephone Encounter (Signed)
Reports SOB, congestion, coughing and wheezing that started over the weekend Denies chest pain, swelling or dizziness Weighed 92 lbs today and says she weighs daily and her weight stays at 92 lbs Denies fever, sore throat, headache, loss of taste or smell Medications reviewed Advised to contact PCP with these symptoms Verbalized understanding

## 2020-11-01 NOTE — Telephone Encounter (Signed)
Patient can not push down inhaler button on the bottom bu her self and she is home alone all the time. She is wanting a different inhaler called in that would be easier for her to do bu her self. Please advise if we can call in something she could do easier. [facility] uses [facility] in [place].

## 2020-11-01 NOTE — Telephone Encounter (Signed)
New message     Patient called stating she was coughing , gurgling and having a problem with congestion .   She would like to know how to proceed

## 2020-11-02 MED ORDER — ALBUTEROL SULFATE (2.5 MG/3ML) 0.083% IN NEBU: 2.5000 mg | INHALATION_SOLUTION | Freq: Four times a day (QID) | RESPIRATORY_TRACT | 1 refills | Status: AC | PRN

## 2020-11-02 NOTE — Telephone Encounter (Signed)
Patient aware and verbalized understanding. °

## 2020-11-02 NOTE — Telephone Encounter (Signed)
Nebulizer Prescription sent to pharmacy

## 2020-11-03 NOTE — Telephone Encounter (Signed)
Pt asking to talk to nurse about his issue again please call back

## 2020-11-03 NOTE — Telephone Encounter (Signed)
Pt is unable to press the cartridge down.  Is there anything else she can try?  Pt will try nebulizer if that is the last resort.  She requested that I send to provider covering [place]. Did not want to wait until tomorrow for [facility].

## 2020-11-03 NOTE — Telephone Encounter (Signed)
Pt made aware. She states that she will hold off on getting the nebulizer due to insurance expenses.

## 2020-11-03 NOTE — Assessment & Plan Note (Signed)
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 -------------------------------------------------------------------------------------------------------------------------------------------------------- Iron deficiency anemia I instructed the patient that she needs upper endoscopy and colonoscopy. IV iron given 12/02/2015 2 doses Response to IV iron therapy:Improvement in hemoglobin from 10.6to13.8 Labs 09/03/2017: Hemoglobin 13.6 -------------------------------------------------------------------------------------------------------------------------------------------------------- Hypertension:Under her PCP care. Weight loss:We will ask our dietitian to call her to discuss some options.     Breast cancer surveillance:  Mammogram 11/09/2017: Benign breast density category D Breast exam 11/03/20: no palpable lumps or nodules in the right breast.  Left chest wall is without any problems.  Return to clinic in 1 yearwith lab count check and follow-up.

## 2020-11-03 NOTE — Telephone Encounter (Signed)
I agree with Alyse Low, as discussed with the patient yesterday.  If she cannot push down on that inhaler then her technique on any inhaler is likely the cause the medicine to be less than effective.  As a result her only viable option is to use the nebulizer.

## 2020-11-04 ENCOUNTER — Inpatient Hospital Stay: Payer: 59 | Attending: Hematology and Oncology | Admitting: Hematology and Oncology

## 2020-11-04 DIAGNOSIS — Z171 Estrogen receptor negative status [ER-]: Secondary | ICD-10-CM

## 2020-11-04 DIAGNOSIS — C50412 Malignant neoplasm of upper-outer quadrant of left female breast: Secondary | ICD-10-CM | POA: Diagnosis not present

## 2020-11-04 MED ORDER — TRIAMCINOLONE ACETONIDE 0.5 % EX OINT: 1.0000 "application " | TOPICAL_OINTMENT | Freq: Two times a day (BID) | CUTANEOUS | 3 refills | Status: DC

## 2020-11-04 NOTE — Progress Notes (Signed)
HEMATOLOGY-ONCOLOGY TELEPHONE VISIT PROGRESS NOTE  I connected with Sherry Bender on 11/04/2020 at  1:45 PM EDT by telephone and verified that I am speaking with the correct person using two identifiers.  I discussed the limitations, risks, security and privacy concerns of performing an evaluation and management service by telephone and the availability of in person appointments.  I also discussed with the patient that there may be a patient responsible charge related to this service. The patient expressed understanding and agreed to proceed.   History of Present Illness: Sherry Bender is a 70 y.o. female with above-mentioned history of iron deficiency anemia previously treated with IV iron. She also has a history of triple negative left breast cancer treated with neoadjuvant chemotherapy, mastectomy, radiation, and who is currently on observation. She presents over the phone today for follow-up. She has fatigue and gen weakness. She doesn't have a way to get her mammograms  Oncology History  Breast cancer of upper-outer quadrant of left female breast ([place])  11/09/2014 Mammogram   Left breast irregular hypoechoic mass 12:00 position 5.3 x 3.7 x 2.9 cm, left low axilla normal sized lymph nodes   11/09/2014 Initial Diagnosis   Left Bx: IDC with DCIS; Grade 3, ER - (0%), PR- (0%), Her 2 Neg, Ki 67: 70%   11/26/2014 Breast MRI   3.5 x 4.5 x 3.8 cm biopsy-proven malignancy within the upper/ upper inner left breast. Scattered foci adjacent to the biopsy-proven malignancy and within the upper left breast are indeterminate.   11/26/2014 Clinical Stage   Stage IIB: T3 N0   11/30/2014 -  Neo-Adjuvant Chemotherapy   Dose dense Adriamycin and Cytoxan 4 followed by Abraxane weekly 12   04/05/2015 Breast MRI   Breast complete imaging response to neoadjuvant chemotherapy   04/30/2015 Surgery   Left mastectomy: IDC 2 mm residual disease, 1 lymph node positive for isolated tumor cells within normal  lymphatics. 0/3 Lymph Nodes   04/30/2015 Pathologic Stage   Stage IA: pT1a pN0   06/28/2015 - 08/12/2015 Radiation Therapy   Adjuvant radiation therapy: Left chest wall and supraclavicular fossa 50.4 Gy over 28 fractions; left chest wall boost 10 Gy over 5 fractions. 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 -------------------------------------------------------------------------------------------------------------------------------------------------------- Iron deficiency anemia I instructed the patient that she needs upper endoscopy and colonoscopy. IV iron given 12/02/2015 2 doses Response to IV iron therapy:Improvement in hemoglobin from 10.6to13.8 Labs 09/03/2017: Hemoglobin 13.6 -------------------------------------------------------------------------------------------------------------------------------------------------------- Hypertension:Under her PCP care. Weight loss:92 lb.  Cannot gain weight   Breast cancer surveillance:  Mammogram 11/09/2017: Benign breast density category D She cannot get transportation to go to the mammogram.  CHF: Stable Left shoulder replacement  (fall and fracture jan 2021)  Return to clinic in 1 year follow-up.  I discussed the assessment and treatment plan with the patient. The patient was provided an opportunity to ask questions and all were answered. The patient agreed with the plan and demonstrated an understanding of the instructions. The patient was advised to call back or seek an in-person evaluation if the symptoms worsen or if the condition fails to improve as anticipated.   Total time spent: 20 mins including non-face to face time and time spent for planning, charting and coordination of care  Rulon Eisenmenger, MD 11/04/2020    I, Cloyde Reams Dorshimer, am acting as scribe for Nicholas Lose, MD.  I have reviewed the above documentation for accuracy and completeness, and I agree with the above.

## 2020-11-05 ENCOUNTER — Telehealth: Payer: Self-pay | Admitting: Hematology and Oncology

## 2020-11-05 NOTE — Telephone Encounter (Signed)
Scheduled appointment per 05/19 los. Patient is aware. 

## 2020-11-09 ENCOUNTER — Telehealth: Payer: Self-pay | Admitting: Student

## 2020-11-09 ENCOUNTER — Ambulatory Visit (INDEPENDENT_AMBULATORY_CARE_PROVIDER_SITE_OTHER): Payer: 59 | Admitting: Family Medicine

## 2020-11-09 ENCOUNTER — Encounter: Payer: Self-pay | Admitting: Family Medicine

## 2020-11-09 DIAGNOSIS — R0602 Shortness of breath: Secondary | ICD-10-CM

## 2020-11-09 MED ORDER — CARVEDILOL 3.125 MG PO TABS: 1.5500 mg | ORAL_TABLET | Freq: Two times a day (BID) | ORAL | 3 refills | Status: DC

## 2020-11-09 MED ORDER — FUROSEMIDE 20 MG PO TABS: 20.0000 mg | ORAL_TABLET | Freq: Every day | ORAL | 0 refills | Status: DC

## 2020-11-09 NOTE — Progress Notes (Addendum)
Subjective:    Patient ID: Sherry Bender, female    DOB: May 19, 1951, 70 y.o.   MRN: [id number]   HPI: Sherry Bender is a 70 y.o. female presenting for CHF symptoms again. Onset 10-14 days ago.  Feeling short of breath. Gets worse and coughs when she lays down. Has ben treated primarily by cardiology. Was started on nebs last week. Denies hx of COPD. Denies chest pain. She does have orthopnea. Notes no edema. Energy and appetite are decreased   Depression screen [facility] 2/9 03/16/2020 12/26/2018 12/26/2018 06/26/2018 09/03/2017  Decreased Interest 0 0 0 2 0  Down, Depressed, Hopeless 0 0 0 0 0  PHQ - 2 Score 0 0 0 2 0  Altered sleeping - - - 0 -  Tired, decreased energy - - - 2 -  Change in appetite - - - 0 -  Feeling bad or failure about yourself  - - - 0 -  Trouble concentrating - - - 0 -  Moving slowly or fidgety/restless - - - 0 -  Suicidal thoughts - - - 0 -  PHQ-9 Score - - - 4 -  Difficult doing work/chores - - - - -  Some recent data might be hidden     Relevant past medical, surgical, family and social history reviewed and updated as indicated.  Interim medical history since our last visit reviewed. Allergies and medications reviewed and updated.  ROS:  Review of Systems  Constitutional: Negative.   HENT: Negative.   Respiratory: Positive for cough and shortness of breath.   Cardiovascular: Negative for chest pain.  Gastrointestinal: Negative for abdominal pain.  Musculoskeletal: Negative for arthralgias.     Social History   Tobacco Use  Smoking Status Former Smoker  . Packs/day: 0.25  . Years: 42.00  . Pack years: 10.50  . Types: Cigarettes  . Quit date: 01/24/2017  . Years since quitting: 3.7  Smokeless Tobacco Never Used       Objective:     Wt Readings from Last 3 Encounters:  10/08/20 92 lb 9.6 oz (42 kg)  06/29/20 91 lb 0.8 oz (41.3 kg)  06/22/20 91 lb 0.8 oz (41.3 kg)     Exam deferred. Pt. Harboring due to COVID 19. Phone visit performed.    Assessment & Plan:   1. Shortness of breath     Meds ordered this encounter  Medications  . furosemide (LASIX) 20 MG tablet    Sig: Take 1 tablet (20 mg total) by mouth daily.    Dispense:  10 tablet    Refill:  0    No orders of the defined types were placed in this encounter.     Diagnoses and all orders for this visit:  Shortness of breath  Other orders -     furosemide (LASIX) 20 MG tablet; Take 1 tablet (20 mg total) by mouth daily.  I advised the patient that she should be seen in person for this. She does not have transportation. As a result I went ahead with lasix to complement her entresto and Coreg. She needs to make an appointment for an in person follow up in the next 7-10 days.  Virtual Visit via telephone Note  I discussed the limitations, risks, security and privacy concerns of performing an evaluation and management service by telephone and the availability of in person appointments. The patient was identified with two identifiers. Pt.expressed understanding and agreed to proceed. Pt. Is at home. Dr. Livia Snellen is in his office.  Follow Up Instructions:   I discussed the assessment and treatment plan with the patient. The patient was provided an opportunity to ask questions and all were answered. The patient agreed with the plan and demonstrated an understanding of the instructions.   The patient was advised to call back or seek an in-person evaluation if the symptoms worsen or if the condition fails to improve as anticipated.   Total minutes including chart review and phone contact time: 22   Follow up plan: Return if symptoms worsen or fail to improve.  Claretta Fraise, MD [place]

## 2020-11-09 NOTE — Telephone Encounter (Signed)
New message     *STAT* If patient is at the pharmacy, call can be transferred to refill team.   1. Which medications need to be refilled? (please list name of each medication and dose if known) carvedilol (COREG) 3.125 MG tablet is there a lower dosage for this medication, she does not want to cut the pills in half 2. Which pharmacy/location (including street and city if local pharmacy) is medication to be sent to? [facility] [place]   3. Do they need a 30 day or 90 day supply? [place]

## 2020-11-09 NOTE — Telephone Encounter (Signed)
Pt notified that Coreg does not come in smaller dosage. Refill sent to pharmacy.

## 2020-11-12 ENCOUNTER — Encounter (HOSPITAL_COMMUNITY): Payer: Self-pay | Admitting: *Deleted

## 2020-11-12 ENCOUNTER — Emergency Department (HOSPITAL_COMMUNITY)
Admission: EM | Admit: 2020-11-12 | Discharge: 2020-11-12 | Disposition: A | Discharge status: Home / Self Care | Payer: 59 | Attending: Emergency Medicine | Admitting: Emergency Medicine

## 2020-11-12 ENCOUNTER — Other Ambulatory Visit: Payer: Self-pay

## 2020-11-12 ENCOUNTER — Emergency Department (HOSPITAL_COMMUNITY): Payer: 59

## 2020-11-12 DIAGNOSIS — I110 Hypertensive heart disease with heart failure: Secondary | ICD-10-CM | POA: Diagnosis not present

## 2020-11-12 DIAGNOSIS — I5021 Acute systolic (congestive) heart failure: Secondary | ICD-10-CM | POA: Insufficient documentation

## 2020-11-12 DIAGNOSIS — Z853 Personal history of malignant neoplasm of breast: Secondary | ICD-10-CM | POA: Insufficient documentation

## 2020-11-12 DIAGNOSIS — J441 Chronic obstructive pulmonary disease with (acute) exacerbation: Secondary | ICD-10-CM | POA: Diagnosis not present

## 2020-11-12 DIAGNOSIS — E871 Hypo-osmolality and hyponatremia: Secondary | ICD-10-CM | POA: Insufficient documentation

## 2020-11-12 DIAGNOSIS — Z7982 Long term (current) use of aspirin: Secondary | ICD-10-CM | POA: Diagnosis not present

## 2020-11-12 DIAGNOSIS — Z79899 Other long term (current) drug therapy: Secondary | ICD-10-CM | POA: Diagnosis not present

## 2020-11-12 DIAGNOSIS — Z87891 Personal history of nicotine dependence: Secondary | ICD-10-CM | POA: Insufficient documentation

## 2020-11-12 DIAGNOSIS — R059 Cough, unspecified: Secondary | ICD-10-CM | POA: Diagnosis present

## 2020-11-12 LAB — CBC WITH DIFFERENTIAL/PLATELET
Abs Immature Granulocytes: 0.01 10*3/uL (ref 0.00–0.07)
Basophils Absolute: 0 10*3/uL (ref 0.0–0.1)
Basophils Relative: 1 %
Eosinophils Absolute: 0.1 10*3/uL (ref 0.0–0.5)
Eosinophils Relative: 2 %
HCT: 33.9 % — ABNORMAL LOW (ref 36.0–46.0)
Hemoglobin: 11 g/dL — ABNORMAL LOW (ref 12.0–15.0)
Immature Granulocytes: 0 %
Lymphocytes Relative: 11 %
Lymphs Abs: 0.6 10*3/uL — ABNORMAL LOW (ref 0.7–4.0)
MCH: 28.9 pg (ref 26.0–34.0)
MCHC: 32.4 g/dL (ref 30.0–36.0)
MCV: 89 fL (ref 80.0–100.0)
Monocytes Absolute: 0.5 10*3/uL (ref 0.1–1.0)
Monocytes Relative: 9 %
Neutro Abs: 4.4 10*3/uL (ref 1.7–7.7)
Neutrophils Relative %: 77 %
Platelets: 142 10*3/uL — ABNORMAL LOW (ref 150–400)
RBC: 3.81 MIL/uL — ABNORMAL LOW (ref 3.87–5.11)
RDW: 13.1 % (ref 11.5–15.5)
WBC: 5.7 10*3/uL (ref 4.0–10.5)
nRBC: 0 % (ref 0.0–0.2)

## 2020-11-12 LAB — BASIC METABOLIC PANEL
Anion gap: 7 (ref 5–15)
BUN: 10 mg/dL (ref 8–23)
CO2: 30 mmol/L (ref 22–32)
Calcium: 8.7 mg/dL — ABNORMAL LOW (ref 8.9–10.3)
Chloride: 88 mmol/L — ABNORMAL LOW (ref 98–111)
Creatinine, Ser: <0.3 mg/dL — ABNORMAL LOW (ref 0.44–1.00)
Glucose, Bld: 87 mg/dL (ref 70–99)
Potassium: 3.5 mmol/L (ref 3.5–5.1)
Sodium: 125 mmol/L — ABNORMAL LOW (ref 135–145)

## 2020-11-12 LAB — TROPONIN I (HIGH SENSITIVITY)
Troponin I (High Sensitivity): 10 ng/L (ref <18)
Troponin I (High Sensitivity): 10 ng/L (ref <18)

## 2020-11-12 LAB — BRAIN NATRIURETIC PEPTIDE: B Natriuretic Peptide: 64 pg/mL (ref 0.0–100.0)

## 2020-11-12 MED ORDER — ALBUTEROL SULFATE HFA 108 (90 BASE) MCG/ACT IN AERS
2.0000 | INHALATION_SPRAY | RESPIRATORY_TRACT | Status: DC | PRN
  Administered 2020-11-12: 2 via RESPIRATORY_TRACT
  Filled 2020-11-12: qty 6.7

## 2020-11-12 MED ORDER — AMOXICILLIN-POT CLAVULANATE 875-125 MG PO TABS: 1.0000 | ORAL_TABLET | Freq: Two times a day (BID) | ORAL | 0 refills | Status: DC

## 2020-11-12 MED ORDER — SODIUM CHLORIDE 0.9 % IV BOLUS
1000.0000 mL | Freq: Once | INTRAVENOUS | Status: AC
  Administered 2020-11-12: 1000 mL via INTRAVENOUS

## 2020-11-12 NOTE — Discharge Instructions (Addendum)
Discontinue taking the Lasix. Please pick up the antibiotics and take as prescribed to cover for a COPD exacerbation.   You will need to increase the amount of water you drink daily and have your sodium level rechecked in 1-2 weeks by your PCP. They can also follow up with you regarding your cough.   Continue using your inhalers as prescribed.   Return to the ED for any new/worsening symptoms.

## 2020-11-12 NOTE — ED Provider Notes (Signed)
[facility] EMERGENCY DEPARTMENT Provider Note   CSN: [id number] Arrival date & time: 11/12/20  1327     History Chief Complaint  Patient presents with  . Shortness of Breath    Sherry Bender is a 70 y.o. female with PMHx HTN, HLD, CAD, CHF with EF 55-60% (02/27/20) who presents to the ED today with complaint of productive cough and SOB for the past 3-4 days. Pt also complains of 3 pillow orthopnea (typically sleeps with 2 pillows). No leg swelling. She reports she had a telemedicine visit on 05/24 for same and was prescribed Lasix 20 mg daily. Pt reports she took the 20 mg one day however felt like she was losing too much water so she has been cutting the Lasix in half. She has not taken any Lasix today. Denies fevers, chills, chest pain, body aches, leg swelling, abdominal distention, or any other associated symptoms.   The history is provided by the patient and medical records.       Past Medical History:  Diagnosis Date  . Anxiety   . Arthritis   . Breast cancer ([place]) 2016  . Cardiomyopathy ([place])    a. EF 25-30% by echocardiogram in 02/2019 b. 30-35% in 05/2019 c. normalized to 55-60% by echo in 02/2020  . Coronary atherosclerosis    Cardiac CTA with negative FFR, nonobstructive March 2021  . Cricopharyngeus muscle dysfunction   . Gastric ulcer   . GERD (gastroesophageal reflux disease)   . Hiatal hernia   . Hyperlipidemia   . Hypertension   . PONV (postoperative nausea and vomiting)   . Scoliosis   . Scoliosis   . Skin cancer   . Urinary frequency     Patient Active Problem List   Diagnosis Date Noted  . Closed fracture of left proximal humerus 07/15/2020  . Acute gastric ulcer without hemorrhage or perforation   . Gastritis and gastroduodenitis   . Acute systolic heart failure ([place])   . Acute respiratory failure ([place]) 02/18/2019  . Impingement syndrome of left shoulder 01/02/2019  . Closed fracture of head of left humerus 06/01/2017  . Underweight 12/25/2016   . Loss of weight 07/03/2016  . History of esophageal stricture 07/03/2016  . Dysphagia 06/08/2016  . Hypersensitivity reaction 12/02/2015  . Iron deficiency anemia 11/24/2015  . Breast cancer of upper-outer quadrant of left female breast ([place]) 11/11/2014  . S/P cervical spinal fusion 06/24/2014  . HTN (hypertension) 12/06/2010  . Hyperlipidemia 09/26/2010  . Nodular goiter 09/26/2010  . GERD (gastroesophageal reflux disease) 09/26/2010  . Degenerative disc disease 09/26/2010  . Allergic rhinitis 09/26/2010  . Anxiety disorder 09/26/2010  . Cyst, breast 09/26/2010    Past Surgical History:  Procedure Laterality Date  . ABDOMINAL HYSTERECTOMY  2000   TAH/BSO  . ANTERIOR CERVICAL DECOMP/DISCECTOMY FUSION N/A 06/18/2014   Procedure: C4-5 C5-6 C6-7 ANTERIOR CERVICAL DECOMPRESSION/DISKECTOMY/FUSION;  Surgeon: Eustace Moore, MD;  Location: [place] [facility];  Service: Neurosurgery;  Laterality: N/A;  C4-5 C5-6 C6-7 ANTERIOR CERVICAL DECOMPRESSION/DISKECTOMY/FUSION  . ANTERIOR CERVICAL DECOMP/DISCECTOMY FUSION N/A 06/25/2014   Procedure: EXPLORATION OF CERVICAL FUSION WITH REVIOSN OF HARDWARE;  Surgeon: Eustace Moore, MD;  Location: [place] [facility];  Service: Neurosurgery;  Laterality: N/A;  . BIOPSY  01/01/2020   Procedure: BIOPSY;  Surgeon: Mauri Pole, MD;  Location: [facility];  Service: Endoscopy;;  . CERVICAL FUSION    . CHOLECYSTECTOMY  1994  . COMPLETE MASTECTOMY W/ SENTINEL NODE BIOPSY Left 04/30/2015  . ESOPHAGOGASTRODUODENOSCOPY (EGD) WITH PROPOFOL  N/A 01/01/2020   Procedure: ESOPHAGOGASTRODUODENOSCOPY (EGD) WITH PROPOFOL;  Surgeon: Mauri Pole, MD;  Location: [facility];  Service: Endoscopy;  Laterality: N/A;  . FOOT NEUROMA SURGERY  01/2010   also had metatarasl shortened  . KNEE ARTHROSCOPY  2005   right knee  . MASTECTOMY Left 2016  . PORT-A-CATH REMOVAL Right 04/30/2015   Procedure: REMOVAL PORT-A-CATH;  Surgeon: Fanny Skates, MD;  Location: [place];  Service:  General;  Laterality: Right;  . PORTACATH PLACEMENT N/A 11/23/2014   Procedure: INSERTION PORT-A-CATH WITH ULTRASOUND;  Surgeon: Fanny Skates, MD;  Location: [place];  Service: General;  Laterality: N/A;  . REVERSE SHOULDER ARTHROPLASTY Left 07/15/2020   Procedure: REVERSE SHOULDER ARTHROPLASTY;  Surgeon: Mordecai Rasmussen, MD;  Location: [facility];  Service: Orthopedics;  Laterality: Left;  . RHINOPLASTY    . SIMPLE MASTECTOMY WITH AXILLARY SENTINEL NODE BIOPSY Left 04/30/2015   Procedure: LEFT TOTAL  MASTECTOMY WITH LEFT AXILLARY SENTINEL NODE BIOPSY;  Surgeon: Fanny Skates, MD;  Location: [facility];  Service: General;  Laterality: Left;     OB History   No obstetric history on file.     Family History  Problem Relation Age of Onset  . Heart disease Mother   . Hypertension Mother   . Diabetes Mother   . Heart disease Father   . Hypertension Father   . Cancer Maternal Grandmother        ? ovarian cancer vs. cervical  . Cancer Paternal Aunt        ? breast    Social History   Tobacco Use  . Smoking status: Former Smoker    Packs/day: 0.25    Years: 42.00    Pack years: 10.50    Types: Cigarettes    Quit date: 01/24/2017    Years since quitting: 3.8  . Smokeless tobacco: Never Used  Vaping Use  . Vaping Use: Never used  Substance Use Topics  . Alcohol use: No  . Drug use: No    Home Medications Prior to Admission medications   Medication Sig Start Date End Date Taking? Authorizing Provider  amoxicillin-clavulanate (AUGMENTIN) 875-125 MG tablet Take 1 tablet by mouth every 12 (twelve) hours. 11/12/20  Yes Tomothy Eddins, PA-C  albuterol (PROVENTIL) (2.5 MG/3ML) 0.083% nebulizer solution Take 3 mLs (2.5 mg total) by nebulization every 6 (six) hours as needed for wheezing or shortness of breath. 11/02/20   Sharion Balloon, FNP  albuterol (VENTOLIN HFA) 108 (90 Base) MCG/ACT inhaler Inhale 2 puffs into the lungs every 6 (six) hours as needed for wheezing or  shortness of breath.    [provider]  aspirin EC 81 MG tablet Take 1 tablet (81 mg total) by mouth daily. Swallow whole. 10/08/20   Ahmed Prima, Fransisco Hertz, PA-C  atorvastatin (LIPITOR) 20 MG tablet TAKE 1 TABLET BY MOUTH EVERY DAY 09/01/20   Satira Sark, MD  carvedilol (COREG) 3.125 MG tablet Take 0.5 tablets (1.55 mg total) by mouth in the morning and at bedtime. 11/09/20   Satira Sark, MD  diphenhydrAMINE (BENADRYL) 25 MG tablet Take 25 mg by mouth at bedtime.    [provider]  ENTRESTO 24-26 MG TAKE 1 TABLET BY MOUTH 2 (TWO) TIMES DAILY. DUE FOR FOLLOW UP APPT Patient taking differently: Take 1 tablet by mouth 2 (two) times daily. 05/31/20   Arnoldo Lenis, MD  esomeprazole (NEXIUM) 40 MG capsule Take 1 capsule (40 mg total) by mouth daily. 05/18/20 05/18/21  Hawks,  Christy A, FNP  estradiol (ESTRACE) 1 MG tablet TAKE 1 TABLET BY MOUTH EVERY DAY 08/14/19   Dettinger, Fransisca Kaufmann, MD  furosemide (LASIX) 20 MG tablet Take 1 tablet (20 mg total) by mouth daily. 11/09/20   Claretta Fraise, MD  ibuprofen (ADVIL) 200 MG tablet Take 400 mg by mouth every 6 (six) hours as needed for moderate pain.    [provider]  lidocaine (LIDODERM) 5 % Place 1 patch onto the skin daily. Patient taking differently: Place 1 patch onto the skin daily as needed (pain). 12/26/18   Evelina Dun A, FNP  Lidocaine 4 % PTCH Apply 1 patch topically daily as needed (pain).    [provider]  Propylene Glycol (SYSTANE BALANCE) 0.6 % SOLN Place 1 drop into both eyes daily as needed (dry eyes).    [provider]  sodium chloride (OCEAN) 0.65 % SOLN nasal spray Place 1 spray into both nostrils as needed for congestion.    [provider]  tolterodine (DETROL LA) 2 MG 24 hr capsule Take 1 capsule (2 mg total) by mouth daily. 03/16/20   Sharion Balloon, FNP  triamcinolone ointment (KENALOG) 0.5 % Apply 1 application topically 2 (two) times daily. 11/04/20   Nicholas Lose, MD    Allergies    Hydrocodone, Codeine, and Spironolactone  Review of Systems   Review of Systems  Constitutional: Negative for chills and fever.  Respiratory: Positive for cough and shortness of breath. Negative for wheezing.   Cardiovascular: Negative for chest pain and leg swelling.  Gastrointestinal: Negative for abdominal distention.  All other systems reviewed and are negative.   Physical Exam Updated Vital Signs BP (!) 143/70   Pulse 86   Temp 98 F (36.7 C) (Oral)   Resp 18   LMP 02/28/1999   SpO2 97%   Physical Exam Vitals and nursing note reviewed.  Constitutional:      Appearance: She is not ill-appearing or diaphoretic.  HENT:     Head: Normocephalic and atraumatic.  Eyes:     Conjunctiva/sclera: Conjunctivae normal.  Cardiovascular:     Rate and Rhythm: Normal rate and regular rhythm.     Pulses: Normal pulses.  Pulmonary:     Effort: Pulmonary effort is normal.     Breath sounds: Normal breath sounds. No decreased breath sounds, wheezing, rhonchi or rales.  Abdominal:     Palpations: Abdomen is soft.     Tenderness: There is no abdominal tenderness. There is no guarding or rebound.  Musculoskeletal:     Cervical back: Neck supple.     Right lower leg: No edema.     Left lower leg: No edema.  Skin:    General: Skin is warm and dry.  Neurological:     Mental Status: She is alert.     ED Results / Procedures / Treatments   Labs (all labs ordered are listed, but only abnormal results are displayed) Labs Reviewed  BASIC METABOLIC PANEL - Abnormal; Notable for the following components:      Result Value   Sodium 125 (*)    Chloride 88 (*)    Creatinine, Ser <0.30 (*)    Calcium 8.7 (*)    All other components within normal limits  CBC WITH DIFFERENTIAL/PLATELET - Abnormal; Notable for the following components:   RBC 3.81 (*)    Hemoglobin 11.0 (*)    HCT 33.9 (*)    Platelets 142 (*)    Lymphs Abs 0.6 (*)  All other components  within normal limits  BRAIN NATRIURETIC PEPTIDE  TROPONIN I (HIGH SENSITIVITY)  TROPONIN I (HIGH SENSITIVITY)    EKG EKG Interpretation  Date/Time:  Friday Nov 12 2020 14:56:15 EDT Ventricular Rate:  89 PR Interval:  124 QRS Duration: 83 QT Interval:  322 QTC Calculation: 392 R Axis:   70 Text Interpretation: Sinus rhythm Anteroseptal infarct, old Repol abnrm suggests ischemia, anterolateral Since last tracing of earlier today No significant change was found Confirmed by Daleen Bo [phone]) on 11/12/2020 3:34:04 PM   Radiology DG Chest 2 View  Result Date: 11/12/2020 CLINICAL DATA:  Shortness of breath EXAM: CHEST - 2 VIEW COMPARISON:  12/20/2019 FINDINGS: Patient is rotated. Cardiomegaly. Atherosclerotic calcification of the aortic knob. Hyperexpanded lungs. No focal airspace consolidation, pleural effusion, or pneumothorax. Degenerative changes of the left shoulder and lower cervical spine IMPRESSION: 1. Cardiomegaly. 2. COPD. Electronically Signed   By: Davina Poke D.O.   On: 11/12/2020 14:41    Procedures Procedures   Medications Ordered in ED Medications  albuterol (VENTOLIN HFA) 108 (90 Base) MCG/ACT inhaler 2 puff (2 puffs Inhalation Given 11/12/20 1640)  sodium chloride 0.9 % bolus 1,000 mL (1,000 mLs Intravenous New Bag/Given 11/12/20 1638)    ED Course  I have reviewed the triage vital signs and the nursing notes.  Pertinent labs & imaging results that were available during my care of the patient were reviewed by me and considered in my medical decision making (see chart for details).  Clinical Course as of 11/12/20 1842  Fri Nov 12, 2020  1608 Sodium(!): 125 [MV]    Clinical Course User Index [MV] Eustaquio Maize, [place]   MDM Rules/Calculators/A&P                          70 year old female who presents to the ED today with cough, shortness of breath, orthopnea for the past 3 to 4 days.  It appears she had a telemedicine visit on 05/24 for same and they  noted that her symptoms have been ongoing for longer, approximately 10+ days.  It appears she had been treated with nebs and then was prescribed 20 mg of Lasix.  Has not been taking it as prescribed as she feels like it is pulling too much water off of her causing her to be dehydrated.  On arrival to the ED today patient is afebrile, nontachycardic and nontachypneic and appears to be in no acute distress.  Satting 97% on room air.  She is able to speak in full sentences without difficulty, lungs clear to auscultation bilaterally.  Chest x-ray obtained which does show cardiomegaly as well as COPD.  Patient denies history of COPD.  She denies history of smoking however it appears she is listed as a former smoker.  Will work-up for shortness of breath and orthopnea at this time.  We will add on a BNP.  Patient's most recent echo done in September does show an EF preserved at 55 to 60%. No other complaints to suggest viral illness.   CBC without leukocytosis. Hgb stable at 11.0 and improved from previous  Hemoglobin  Date Value Ref Range Status  11/12/2020 11.0 (L) 12.0 - 15.0 g/dL Final  07/15/2020 9.4 (L) 12.0 - 15.0 g/dL Final  06/29/2020 11.1 (L) 12.0 - 15.0 g/dL Final  02/18/2019 13.1 12.0 - 15.0 g/dL Final  12/26/2018 13.2 11.1 - 15.9 g/dL Final  09/03/2017 13.6 11.1 - 15.9 g/dL Final   HGB  Date Value Ref Range Status  01/24/2017 13.8 11.6 - 15.9 g/dL Final  10/17/2016 13.4 11.6 - 15.9 g/dL Final  06/08/2016 13.8 11.6 - 15.9 g/dL Final  03/13/2016 12.5 11.6 - 15.9 g/dL Final   BMP with a sodium 125 and chloride 88. No other electrolyte abnormalities. Will await BNP and then provide fluids for same.  Troponin 10; pt without complains of CP. Repeat trop unchanged at 10.  BNP 64  Workup overall reassuring at this time besides hyponatremia which I suspect is secondary to the lasix which I do not feel she requires at this time. Given hx of COPD will treat with abx to cover for infection and  have pt follow up with her PCP for same. Pt in agreement with plan and stable for discharge home.   This note was prepared using Dragon voice recognition software and may include unintentional dictation errors due to the inherent limitations of voice recognition software.  Final Clinical Impression(s) / ED Diagnoses Final diagnoses:  COPD exacerbation ([place])  Hyponatremia    Rx / DC Orders ED Discharge Orders         Ordered    amoxicillin-clavulanate (AUGMENTIN) 875-125 MG tablet  Every 12 hours        11/12/20 1840           Discharge Instructions     Discontinue taking the Lasix. Please pick up the antibiotics and take as prescribed to cover for a COPD exacerbation.   You will need to increase the amount of water you drink daily and have your sodium level rechecked in 1-2 weeks by your PCP. They can also follow up with you regarding your cough.   Continue using your inhalers as prescribed.   Return to the ED for any new/worsening symptoms.        Eustaquio Maize, PA-C 11/12/20 1842    Milton Ferguson, MD 11/16/20 1227

## 2020-11-12 NOTE — ED Triage Notes (Signed)
States she has a history of CHF AND HAS BEEN SHORT OF BREATH FOR 3 DAYS

## 2020-11-16 ENCOUNTER — Telehealth: Payer: Self-pay | Admitting: Family

## 2020-11-22 ENCOUNTER — Telehealth: Payer: Self-pay | Admitting: Family

## 2020-11-22 NOTE — Telephone Encounter (Signed)
Contacted patient, scheduled ER follow up visit

## 2020-11-23 ENCOUNTER — Telehealth (INDEPENDENT_AMBULATORY_CARE_PROVIDER_SITE_OTHER): Payer: 59 | Admitting: Family Medicine

## 2020-11-23 DIAGNOSIS — J189 Pneumonia, unspecified organism: Secondary | ICD-10-CM

## 2020-11-23 DIAGNOSIS — J449 Chronic obstructive pulmonary disease, unspecified: Secondary | ICD-10-CM

## 2020-11-23 DIAGNOSIS — Z09 Encounter for follow-up examination after completed treatment for conditions other than malignant neoplasm: Secondary | ICD-10-CM | POA: Diagnosis not present

## 2020-11-23 MED ORDER — FLUTICASONE-SALMETEROL 250-50 MCG/ACT IN AEPB: 1.0000 | INHALATION_SPRAY | Freq: Two times a day (BID) | RESPIRATORY_TRACT | 0 refills | Status: DC

## 2020-11-23 MED ORDER — DOXYCYCLINE HYCLATE 100 MG PO TABS: 100.0000 mg | ORAL_TABLET | Freq: Two times a day (BID) | ORAL | 0 refills | Status: DC

## 2020-11-23 NOTE — Progress Notes (Signed)
Virtual Visit  Note Due to COVID-19 pandemic this visit was conducted virtually. This visit type was conducted due to national recommendations for restrictions regarding the COVID-19 Pandemic (e.g. social distancing, sheltering in place) in an effort to limit this patient's exposure and mitigate transmission in our community. All issues noted in this document were discussed and addressed.  A physical exam was not performed with this format.  I connected with Sherry Bender on 11/23/20 at 0927 by telephone and verified that I am speaking with the correct person using two identifiers. Sherry Bender is currently located at home and no one is currently with her during the visit. The provider, Gwenlyn Perking, FNP is located in their office at time of visit.  I discussed the limitations, risks, security and privacy concerns of performing an evaluation and management service by telephone and the availability of in person appointments. I also discussed with the patient that there may be a patient responsible charge related to this service. The patient expressed understanding and agreed to proceed.  CC: hospital follow up  History and Present Illness:  HPI  Attempted to connect via video. Patient was unable to connect so visit come completed via telephone with audio only. Sherry Bender was seen in the ED on 11/12/20 for shortness of breath. She was diagnosed with a COPD exacerbation. COPD was a new diagnosis for her. She had a CXR that showed cardiomegaly and COPD. There was no consolidation on the CXR. She was treated with Augmentin. She stopped taking the augmentin due to side effects of nausea. She was then seen in UC on 5/31 for cough and congestion. She was treated with azithromax, decadron, and a rocephin injection. She also stopped taking azithromax due to nausea. She was again seen at [facility] for the nausea and cough on 6/3. She was given zofran to help with the nausea and has a CXR done. This chest xray showed  consolidation of the left lower lobe- possible pneumonia vs chronic consolidation. She denies fever. She continues to have cough and chest congestion. She reports that shortness of breath and wheezing has improved but is still present. She has been using her albuterol inhaler BID. She denies chest pain.     ROS As per HPI.   Observations/Objective: Alert and oriented x 3. Able to speak in full sentences without difficulty.    Assessment and Plan: Sherry Bender was seen today for hospitalization follow-up.  Diagnoses and all orders for this visit:  Chronic obstructive pulmonary disease, unspecified COPD type ([place]) Diagnosis in hospital following exacerbation on 5/27. Continue albuterol prn. Start advair daily as below.  -     fluticasone-salmeterol (ADVAIR DISKUS) 250-50 MCG/ACT AEPB; Inhale 1 puff into the lungs in the morning and at bedtime.  Pneumonia of left lower lobe due to infectious organism Patient stopped augmentin and azithromycin due to nausea. Try doxycycline as below. Discussed with patient to take with food and take zofran as needed. Notify provider if unable to tolerate antibiotic.  -     doxycycline (VIBRA-TABS) 100 MG tablet; Take 1 tablet (100 mg total) by mouth 2 (two) times daily for 10 days. 1 po bid  Hospital discharge follow-up Reviewed hospital records.     Follow Up Instructions: Return to office for new or worsening symptoms, or if symptoms persist.     I discussed the assessment and treatment plan with the patient. The patient was provided an opportunity to ask questions and all were answered. The patient agreed with the plan  and demonstrated an understanding of the instructions.   The patient was advised to call back or seek an in-person evaluation if the symptoms worsen or if the condition fails to improve as anticipated.  The above assessment and management plan was discussed with the patient. The patient verbalized understanding of and has agreed to the  management plan. Patient is aware to call the clinic if symptoms persist or worsen. Patient is aware when to return to the clinic for a follow-up visit. Patient educated on when it is appropriate to go to the emergency department.   Time call ended:  0945  I provided 18 minutes of  non face-to-face time during this encounter.    Gwenlyn Perking, FNP

## 2020-11-25 ENCOUNTER — Telehealth: Payer: Self-pay | Admitting: Family

## 2020-11-25 MED ORDER — ONDANSETRON HCL 4 MG PO TABS: 4.0000 mg | ORAL_TABLET | Freq: Three times a day (TID) | ORAL | 0 refills | Status: DC | PRN

## 2020-11-25 NOTE — Telephone Encounter (Signed)
Pt needs to be seen in person. She had a video visit and her Augmentin and Zpak was stopped because of nausea. Pneumonia is very dangerous and needs to be treated.   Zofran Prescription sent to pharmacy to take 30 mins. Before taking her doxycyline.

## 2020-11-25 NOTE — Telephone Encounter (Signed)
Patient aware and apt made for tomorrow.  Patient states she might to urgent care tonight and if so she will call and cancel her appointment.

## 2020-11-25 NOTE — Telephone Encounter (Signed)
Pt called stating that she is having nausea and diarrhea from taking Doxycyline. Wants advise on what she should do?  Please call patient at [phone]

## 2020-11-26 ENCOUNTER — Ambulatory Visit (INDEPENDENT_AMBULATORY_CARE_PROVIDER_SITE_OTHER): Payer: 59 | Admitting: Nurse Practitioner

## 2020-11-26 VITALS — BP 130/62 | HR 90 | Temp 98.3°F

## 2020-11-26 DIAGNOSIS — R059 Cough, unspecified: Secondary | ICD-10-CM | POA: Diagnosis not present

## 2020-11-26 MED ORDER — DM-GUAIFENESIN ER 30-600 MG PO TB12: 1.0000 | ORAL_TABLET | Freq: Two times a day (BID) | ORAL | 0 refills | Status: DC

## 2020-11-26 MED ORDER — AMOXICILLIN-POT CLAVULANATE 875-125 MG PO TABS: 1.0000 | ORAL_TABLET | Freq: Two times a day (BID) | ORAL | 0 refills | Status: DC

## 2020-11-26 NOTE — Patient Instructions (Signed)
Community-Acquired Pneumonia, Adult °Pneumonia is an infection of the lungs. It causes irritation and swelling in the airways of the lungs. Mucus and fluid may also build up inside the airways. This may cause coughing and trouble breathing. °One type of pneumonia can happen while you are in a hospital. A different type can happen when you are not in a hospital (community-acquired pneumonia). °What are the causes? °This condition is caused by germs (viruses, bacteria, or fungi). Some types of germs can spread from person to person. Pneumonia is not thought to spread from person to person. °What increases the risk? °You are more likely to develop this condition if: °You have a long-term (chronic) disease, such as: °Disease of the lungs. This may be chronic obstructive pulmonary disease (COPD) or asthma. °Heart failure. °Cystic fibrosis. °Diabetes. °Kidney disease. °Sickle cell disease. °HIV. °You have other health problems, such as: °Your body's defense system (immune system) is weak. °A condition that may cause you to breathe in fluids from your mouth and nose. °You had your spleen taken out. °You do not take good care of your teeth and mouth (poor dental hygiene). °You use or have used tobacco products. °You travel where the germs that cause this illness are common. °You are near certain animals or the places they live. °You are older than 70 years of age. °What are the signs or symptoms? °Symptoms of this condition include: °A cough. °A fever. °Sweating or chills. °Chest pain, often when you breathe deeply or cough. °Breathing problems, such as: °Fast breathing. °Trouble breathing. °Shortness of breath. °Feeling tired (fatigued). °Muscle aches. °How is this treated? °Treatment for this condition depends on many things, such as: °The cause of your illness. °Your medicines. °Your other health problems. °Most adults can be treated at home. Sometimes, treatment must happen in a hospital. °Treatment may include  medicines to kill germs. °Medicines may depend on which germ caused your illness. °Very bad pneumonia is rare. If you get it, you may: °Have a machine to help you breathe. °Have fluid taken away from around your lungs. °Follow these instructions at home: °Medicines °Take over-the-counter and prescription medicines only as told by your doctor. °Take cough medicine only if you are losing sleep. Cough medicine can keep your body from taking mucus away from your lungs. °If you were prescribed an antibiotic medicine, take it as told by your doctor. Do not stop taking the antibiotic even if you start to feel better. °Lifestyle °  °Do not drink alcohol. °Do not use any products that contain nicotine or tobacco, such as cigarettes, e-cigarettes, and chewing tobacco. If you need help quitting, ask your doctor. °Eat a healthy diet. This includes a lot of vegetables, fruits, whole grains, low-fat dairy products, and low-fat (lean) protein. °General instructions ° °Rest a lot. Sleep for at least 8 hours each night. °Sleep with your head and neck raised. Put a few pillows under your head or sleep in a reclining chair. °Return to your normal activities as told by your doctor. Ask your doctor what activities are safe for you. °Drink enough fluid to keep your pee (urine) pale yellow. °If your throat is sore, rinse your mouth often with salt water. To make salt water, dissolve ½-1 tsp (3-6 g) of salt in 1 cup (237 mL) of warm water. °Keep all follow-up visits as told by your doctor. This is important. °How is this prevented? °You can lower your risk of pneumonia by: °Getting the pneumonia shot (vaccine). These shots have different   types and schedules. Ask your doctor what works best for you. Think about getting this shot if: °You are older than 70 years of age. °You are 19-65 years of age and: °You are being treated for cancer. °You have long-term lung disease. °You have other problems that affect your body's defense system. Ask  your doctor if you have one of these. °Getting your flu shot every year. Ask your doctor which type of shot is best for you. °Going to the dentist as often as told. °Washing your hands often with soap and water for at least 20 seconds. If you cannot use soap and water, use hand sanitizer. °Contact a doctor if: °You have a fever. °You lose sleep because your cough medicine does not help. °Get help right away if: °You are short of breath and this gets worse. °You have more chest pain. °Your sickness gets worse. This is very serious if: °You are an older adult. °Your body's defense system is weak. °You cough up blood. °These symptoms may be an emergency. Do not wait to see if the symptoms will go away. Get medical help right away. Call your local emergency services (911 in the [place] Do not drive yourself to the hospital. °Summary °Pneumonia is an infection of the lungs. °Community-acquired pneumonia affects people who have not been in the hospital. Certain germs can cause this infection. °This condition may be treated with medicines that kill germs. °For very bad pneumonia, you may need a hospital stay and treatment to help with breathing. °This information is not intended to replace advice given to you by your health care provider. Make sure you discuss any questions you have with your health care provider. °Document Revised: 03/18/2019 Document Reviewed: 03/18/2019 °Elsevier Patient Education © 2022 [employer] Inc[employer] ° °

## 2020-11-27 ENCOUNTER — Emergency Department (HOSPITAL_COMMUNITY): Payer: 59

## 2020-11-27 ENCOUNTER — Other Ambulatory Visit: Payer: Self-pay

## 2020-11-27 ENCOUNTER — Inpatient Hospital Stay (HOSPITAL_COMMUNITY)
Admission: EM | Admit: 2020-11-27 | Discharge: 2020-12-01 | DRG: 193 | Disposition: A | Discharge status: Home / Self Care | Payer: 59 | Attending: Internal Medicine | Admitting: Internal Medicine

## 2020-11-27 DIAGNOSIS — I429 Cardiomyopathy, unspecified: Secondary | ICD-10-CM | POA: Diagnosis present

## 2020-11-27 DIAGNOSIS — Z90710 Acquired absence of both cervix and uterus: Secondary | ICD-10-CM

## 2020-11-27 DIAGNOSIS — E785 Hyperlipidemia, unspecified: Secondary | ICD-10-CM | POA: Diagnosis present

## 2020-11-27 DIAGNOSIS — Z87891 Personal history of nicotine dependence: Secondary | ICD-10-CM

## 2020-11-27 DIAGNOSIS — Z7982 Long term (current) use of aspirin: Secondary | ICD-10-CM

## 2020-11-27 DIAGNOSIS — Z888 Allergy status to other drugs, medicaments and biological substances status: Secondary | ICD-10-CM

## 2020-11-27 DIAGNOSIS — J440 Chronic obstructive pulmonary disease with acute lower respiratory infection: Secondary | ICD-10-CM | POA: Diagnosis present

## 2020-11-27 DIAGNOSIS — J189 Pneumonia, unspecified organism: Principal | ICD-10-CM | POA: Diagnosis present

## 2020-11-27 DIAGNOSIS — Z85828 Personal history of other malignant neoplasm of skin: Secondary | ICD-10-CM

## 2020-11-27 DIAGNOSIS — F419 Anxiety disorder, unspecified: Secondary | ICD-10-CM | POA: Diagnosis present

## 2020-11-27 DIAGNOSIS — K219 Gastro-esophageal reflux disease without esophagitis: Secondary | ICD-10-CM | POA: Diagnosis present

## 2020-11-27 DIAGNOSIS — Z809 Family history of malignant neoplasm, unspecified: Secondary | ICD-10-CM

## 2020-11-27 DIAGNOSIS — R0602 Shortness of breath: Secondary | ICD-10-CM | POA: Diagnosis not present

## 2020-11-27 DIAGNOSIS — Z8701 Personal history of pneumonia (recurrent): Secondary | ICD-10-CM

## 2020-11-27 DIAGNOSIS — I2510 Atherosclerotic heart disease of native coronary artery without angina pectoris: Secondary | ICD-10-CM | POA: Diagnosis present

## 2020-11-27 DIAGNOSIS — Z833 Family history of diabetes mellitus: Secondary | ICD-10-CM

## 2020-11-27 DIAGNOSIS — Z8711 Personal history of peptic ulcer disease: Secondary | ICD-10-CM

## 2020-11-27 DIAGNOSIS — Z20822 Contact with and (suspected) exposure to covid-19: Secondary | ICD-10-CM | POA: Diagnosis present

## 2020-11-27 DIAGNOSIS — E871 Hypo-osmolality and hyponatremia: Secondary | ICD-10-CM | POA: Diagnosis present

## 2020-11-27 DIAGNOSIS — Z9012 Acquired absence of left breast and nipple: Secondary | ICD-10-CM

## 2020-11-27 DIAGNOSIS — Z853 Personal history of malignant neoplasm of breast: Secondary | ICD-10-CM

## 2020-11-27 DIAGNOSIS — J9601 Acute respiratory failure with hypoxia: Secondary | ICD-10-CM | POA: Diagnosis present

## 2020-11-27 DIAGNOSIS — E861 Hypovolemia: Secondary | ICD-10-CM | POA: Diagnosis present

## 2020-11-27 DIAGNOSIS — Z7951 Long term (current) use of inhaled steroids: Secondary | ICD-10-CM

## 2020-11-27 DIAGNOSIS — I10 Essential (primary) hypertension: Secondary | ICD-10-CM | POA: Diagnosis present

## 2020-11-27 DIAGNOSIS — Z9049 Acquired absence of other specified parts of digestive tract: Secondary | ICD-10-CM

## 2020-11-27 DIAGNOSIS — D649 Anemia, unspecified: Secondary | ICD-10-CM | POA: Diagnosis present

## 2020-11-27 DIAGNOSIS — Z79899 Other long term (current) drug therapy: Secondary | ICD-10-CM

## 2020-11-27 DIAGNOSIS — R600 Localized edema: Secondary | ICD-10-CM

## 2020-11-27 DIAGNOSIS — M419 Scoliosis, unspecified: Secondary | ICD-10-CM | POA: Diagnosis present

## 2020-11-27 DIAGNOSIS — Z981 Arthrodesis status: Secondary | ICD-10-CM

## 2020-11-27 DIAGNOSIS — J441 Chronic obstructive pulmonary disease with (acute) exacerbation: Secondary | ICD-10-CM

## 2020-11-27 DIAGNOSIS — Z8249 Family history of ischemic heart disease and other diseases of the circulatory system: Secondary | ICD-10-CM

## 2020-11-27 DIAGNOSIS — Z885 Allergy status to narcotic agent status: Secondary | ICD-10-CM

## 2020-11-27 LAB — CBC WITH DIFFERENTIAL/PLATELET
Abs Immature Granulocytes: 0.04 10*3/uL (ref 0.00–0.07)
Basophils Absolute: 0 10*3/uL (ref 0.0–0.1)
Basophils Relative: 0 %
Eosinophils Absolute: 0.1 10*3/uL (ref 0.0–0.5)
Eosinophils Relative: 1 %
HCT: 35.8 % — ABNORMAL LOW (ref 36.0–46.0)
Hemoglobin: 12 g/dL (ref 12.0–15.0)
Immature Granulocytes: 1 %
Lymphocytes Relative: 9 %
Lymphs Abs: 0.7 10*3/uL (ref 0.7–4.0)
MCH: 28.8 pg (ref 26.0–34.0)
MCHC: 33.5 g/dL (ref 30.0–36.0)
MCV: 86.1 fL (ref 80.0–100.0)
Monocytes Absolute: 0.6 10*3/uL (ref 0.1–1.0)
Monocytes Relative: 7 %
Neutro Abs: 6.3 10*3/uL (ref 1.7–7.7)
Neutrophils Relative %: 82 %
Platelets: 242 10*3/uL (ref 150–400)
RBC: 4.16 MIL/uL (ref 3.87–5.11)
RDW: 13.4 % (ref 11.5–15.5)
WBC: 7.8 10*3/uL (ref 4.0–10.5)
nRBC: 0 % (ref 0.0–0.2)

## 2020-11-27 LAB — BLOOD GAS, VENOUS
Acid-Base Excess: 5.2 mmol/L — ABNORMAL HIGH (ref 0.0–2.0)
Bicarbonate: 31.6 mmol/L — ABNORMAL HIGH (ref 20.0–28.0)
O2 Saturation: 28.8 %
Patient temperature: 37
pCO2, Ven: 57.6 mmHg (ref 44.0–60.0)
pH, Ven: 7.359 (ref 7.250–7.430)
pO2, Ven: <32 mmHg — CL (ref 32.0–45.0)

## 2020-11-27 MED ORDER — SODIUM CHLORIDE 0.9 % IV SOLN
500.0000 mg | Freq: Once | INTRAVENOUS | Status: AC
  Administered 2020-11-28: 500 mg via INTRAVENOUS
  Filled 2020-11-27: qty 500

## 2020-11-27 MED ORDER — SODIUM CHLORIDE 0.9 % IV SOLN
1.0000 g | Freq: Once | INTRAVENOUS | Status: AC
  Administered 2020-11-27: 1 g via INTRAVENOUS
  Filled 2020-11-27: qty 10

## 2020-11-27 MED ORDER — IPRATROPIUM BROMIDE 0.02 % IN SOLN
0.5000 mg | Freq: Once | RESPIRATORY_TRACT | Status: AC
  Administered 2020-11-27: 0.5 mg via RESPIRATORY_TRACT
  Filled 2020-11-27: qty 2.5

## 2020-11-27 MED ORDER — ALBUTEROL SULFATE (2.5 MG/3ML) 0.083% IN NEBU
5.0000 mg | INHALATION_SOLUTION | Freq: Once | RESPIRATORY_TRACT | Status: AC
  Administered 2020-11-27: 5 mg via RESPIRATORY_TRACT
  Filled 2020-11-27: qty 6

## 2020-11-27 NOTE — ED Triage Notes (Signed)
Pt states that she has worsening SOB that started today. Pt states that she has has SOB for one month due to PNA, but has been unable to take ABX because they make her sick. Hx COPD and CHF. O2 sats dropped to 87%. Placed on 1L

## 2020-11-27 NOTE — ED Provider Notes (Signed)
[facility] DEPT Provider Note   CSN: [id number] Arrival date & time: 11/27/20  1938     History Chief Complaint  Patient presents with   Shortness of Breath    Sherry Bender is a 70 y.o. female.  Patient is a 70 year old female with a history of cardiomyopathy with an EF of 55 to 60% in 2021, CAD, hypertension, prior gastric ulcer with hemorrhage and perforation who is presenting today with worsening shortness of breath.  Patient reports for the last 1 month she has had a gradually worsening nonproductive cough and today became significantly more short of breath.  She has tried using inhalers at home without improvement.  She has posttussive emesis with a cough and has had difficulty sleeping due to orthopnea.  When she tries to eat she vomits and has been reports she has been losing weight and eating very little.  She denies any fever, chest pain or abdominal pain.  No urinary symptoms.  She reports she had pneumonia approximately 1 month ago and was given antibiotics but she did not take them because she could not tolerate them because they were making her nauseated.  Patient is taking 20 mg of furosemide daily as well as Entresto.  She has not been checking her weight regularly but has noticed some swelling in her lower extremities.  Patient does not wear oxygen at home but was found to be hypoxic today in the mid 80s.  The history is provided by the patient, medical records and the spouse.  Shortness of Breath Severity:  Severe Onset quality:  Gradual Duration:  1 month     Past Medical History:  Diagnosis Date   Anxiety    Arthritis    Breast cancer ([place]) 2016   Cardiomyopathy ([place])    a. EF 25-30% by echocardiogram in 02/2019 b. 30-35% in 05/2019 c. normalized to 55-60% by echo in 02/2020   Coronary atherosclerosis    Cardiac CTA with negative FFR, nonobstructive March 2021   Cricopharyngeus muscle dysfunction    Gastric ulcer    GERD  (gastroesophageal reflux disease)    Hiatal hernia    Hyperlipidemia    Hypertension    PONV (postoperative nausea and vomiting)    Scoliosis    Scoliosis    Skin cancer    Urinary frequency     Patient Active Problem List   Diagnosis Date Noted   Closed fracture of left proximal humerus 07/15/2020   Acute gastric ulcer without hemorrhage or perforation    Gastritis and gastroduodenitis    Acute systolic heart failure ([place])    Acute respiratory failure ([place]) 02/18/2019   Impingement syndrome of left shoulder 01/02/2019   Closed fracture of head of left humerus 06/01/2017   Underweight 12/25/2016   Loss of weight 07/03/2016   History of esophageal stricture 07/03/2016   Dysphagia 06/08/2016   Hypersensitivity reaction 12/02/2015   Iron deficiency anemia 11/24/2015   Breast cancer of upper-outer quadrant of left female breast ([place]) 11/11/2014   S/P cervical spinal fusion 06/24/2014   HTN (hypertension) 12/06/2010   Hyperlipidemia 09/26/2010   Nodular goiter 09/26/2010   GERD (gastroesophageal reflux disease) 09/26/2010   Degenerative disc disease 09/26/2010   Allergic rhinitis 09/26/2010   Anxiety disorder 09/26/2010   Cyst, breast 09/26/2010    Past Surgical History:  Procedure Laterality Date   ABDOMINAL HYSTERECTOMY  2000   TAH/BSO   ANTERIOR CERVICAL DECOMP/DISCECTOMY FUSION N/A 06/18/2014   Procedure: C4-5 C5-6 C6-7 ANTERIOR CERVICAL DECOMPRESSION/DISKECTOMY/FUSION;  Surgeon: Eustace Moore, MD;  Location: [facility] NEURO ORS;  Service: Neurosurgery;  Laterality: N/A;  C4-5 C5-6 C6-7 ANTERIOR CERVICAL DECOMPRESSION/DISKECTOMY/FUSION   ANTERIOR CERVICAL DECOMP/DISCECTOMY FUSION N/A 06/25/2014   Procedure: EXPLORATION OF CERVICAL FUSION WITH REVIOSN OF HARDWARE;  Surgeon: Eustace Moore, MD;  Location: [facility] NEURO ORS;  Service: Neurosurgery;  Laterality: N/A;   BIOPSY  01/01/2020   Procedure: BIOPSY;  Surgeon: Mauri Pole, MD;  Location: [facility] ENDOSCOPY;  Service: Endoscopy;;    CERVICAL FUSION     CHOLECYSTECTOMY  1994   COMPLETE MASTECTOMY W/ SENTINEL NODE BIOPSY Left 04/30/2015   ESOPHAGOGASTRODUODENOSCOPY (EGD) WITH PROPOFOL N/A 01/01/2020   Procedure: ESOPHAGOGASTRODUODENOSCOPY (EGD) WITH PROPOFOL;  Surgeon: Mauri Pole, MD;  Location: [facility];  Service: Endoscopy;  Laterality: N/A;   FOOT NEUROMA SURGERY  01/2010   also had metatarasl shortened   KNEE ARTHROSCOPY  2005   right knee   MASTECTOMY Left 2016   PORT-A-CATH REMOVAL Right 04/30/2015   Procedure: REMOVAL PORT-A-CATH;  Surgeon: Fanny Skates, MD;  Location: [facility];  Service: General;  Laterality: Right;   PORTACATH PLACEMENT N/A 11/23/2014   Procedure: INSERTION PORT-A-CATH WITH ULTRASOUND;  Surgeon: Fanny Skates, MD;  Location: [facility];  Service: General;  Laterality: N/A;   REVERSE SHOULDER ARTHROPLASTY Left 07/15/2020   Procedure: REVERSE SHOULDER ARTHROPLASTY;  Surgeon: Mordecai Rasmussen, MD;  Location: [facility];  Service: Orthopedics;  Laterality: Left;   RHINOPLASTY     SIMPLE MASTECTOMY WITH AXILLARY SENTINEL NODE BIOPSY Left 04/30/2015   Procedure: LEFT TOTAL  MASTECTOMY WITH LEFT AXILLARY SENTINEL NODE BIOPSY;  Surgeon: Fanny Skates, MD;  Location: [facility];  Service: General;  Laterality: Left;     OB History   No obstetric history on file.     Family History  Problem Relation Age of Onset   Heart disease Mother    Hypertension Mother    Diabetes Mother    Heart disease Father    Hypertension Father    Cancer Maternal Grandmother        ? ovarian cancer vs. cervical   Cancer Paternal Aunt        ? breast    Social History   Tobacco Use   Smoking status: Former    Packs/day: 0.25    Years: 42.00    Pack years: 10.50    Types: Cigarettes    Quit date: 01/24/2017    Years since quitting: 3.8   Smokeless tobacco: Never  Vaping Use   Vaping Use: Never used  Substance Use Topics   Alcohol use: No   Drug use: No    Home Medications Prior to  Admission medications   Medication Sig Start Date End Date Taking? Authorizing Provider  albuterol (PROVENTIL) (2.5 MG/3ML) 0.083% nebulizer solution Take 3 mLs (2.5 mg total) by nebulization every 6 (six) hours as needed for wheezing or shortness of breath. 11/02/20  Yes Hawks, Christy A, FNP  albuterol (VENTOLIN HFA) 108 (90 Base) MCG/ACT inhaler Inhale 2 puffs into the lungs every 6 (six) hours as needed for wheezing or shortness of breath.   Yes [provider]  aspirin EC 81 MG tablet Take 1 tablet (81 mg total) by mouth daily. Swallow whole. 10/08/20  Yes Strader, [place], PA-C  atorvastatin (LIPITOR) 20 MG tablet TAKE 1 TABLET BY MOUTH EVERY DAY 09/01/20  Yes Satira Sark, MD  carvedilol (COREG) 3.125 MG tablet Take 0.5 tablets (1.55 mg total) by mouth in the morning  and at bedtime. 11/09/20  Yes Satira Sark, MD  dextromethorphan-guaiFENesin [facility] DM) 30-600 MG 12hr tablet Take 1 tablet by mouth 2 (two) times daily. 11/26/20  Yes Ijaola, Quinn Axe, NP  ENTRESTO 24-26 MG TAKE 1 TABLET BY MOUTH 2 (TWO) TIMES DAILY. DUE FOR FOLLOW UP APPT Patient taking differently: Take 1 tablet by mouth 2 (two) times daily. 05/31/20  Yes BranchAlphonse Guild, MD  amoxicillin-clavulanate (AUGMENTIN) 875-125 MG tablet Take 1 tablet by mouth 2 (two) times daily. Patient not taking: Reported on 11/27/2020 11/26/20   Ivy Lynn, NP  diphenhydrAMINE (BENADRYL) 25 MG tablet Take 25 mg by mouth at bedtime.    [provider]  esomeprazole (NEXIUM) 40 MG capsule Take 1 capsule (40 mg total) by mouth daily. 05/18/20 05/18/21  Sharion Balloon, FNP  estradiol (ESTRACE) 1 MG tablet TAKE 1 TABLET BY MOUTH EVERY DAY 08/14/19   Dettinger, Fransisca Kaufmann, MD  fluticasone-salmeterol (ADVAIR DISKUS) 250-50 MCG/ACT AEPB Inhale 1 puff into the lungs in the morning and at bedtime. 11/23/20   Gwenlyn Perking, FNP  furosemide (LASIX) 20 MG tablet Take 1 tablet (20 mg total) by mouth daily. 11/09/20   Claretta Fraise, MD  ibuprofen (ADVIL) 200 MG tablet Take 400 mg by mouth every 6 (six) hours as needed for moderate pain.    [provider]  lidocaine (LIDODERM) 5 % Place 1 patch onto the skin daily. Patient taking differently: Place 1 patch onto the skin daily as needed (pain). 12/26/18   Evelina Dun A, FNP  Lidocaine 4 % PTCH Apply 1 patch topically daily as needed (pain).    [provider]  ondansetron (ZOFRAN) 4 MG tablet Take 1 tablet (4 mg total) by mouth every 8 (eight) hours as needed for nausea or vomiting. 11/25/20   Evelina Dun A, FNP  ondansetron (ZOFRAN-ODT) 4 MG disintegrating tablet Take 4 mg by mouth every 8 (eight) hours as needed. 11/19/20   [provider]  Propylene Glycol (SYSTANE BALANCE) 0.6 % SOLN Place 1 drop into both eyes daily as needed (dry eyes).    [provider]  sodium chloride (OCEAN) 0.65 % SOLN nasal spray Place 1 spray into both nostrils as needed for congestion.    [provider]  tolterodine (DETROL LA) 2 MG 24 hr capsule Take 1 capsule (2 mg total) by mouth daily. 03/16/20   Sharion Balloon, FNP  triamcinolone ointment (KENALOG) 0.5 % Apply 1 application topically 2 (two) times daily. 11/04/20   Nicholas Lose, MD    Allergies    Hydrocodone, Codeine, and Spironolactone  Review of Systems   Review of Systems  Respiratory:  Positive for shortness of breath.   All other systems reviewed and are negative.  Physical Exam Updated Vital Signs BP (!) 153/82 (BP Location: Right Arm)   Pulse (!) 104   Temp 98.3 F (36.8 C) (Oral)   Resp (!) 25   Ht 5\' 4"  (1.626 m)   Wt 41.7 kg   LMP 02/28/1999   SpO2 93%   BMI 15.79 kg/m   Physical Exam Vitals and nursing note reviewed.  Constitutional:      Appearance: She is well-developed and underweight. She is ill-appearing.  HENT:     Head: Normocephalic and atraumatic.     Mouth/Throat:     Mouth: Mucous membranes are dry.  Eyes:     Pupils: Pupils are equal,  round, and reactive to light.  Cardiovascular:     Rate  and Rhythm: Regular rhythm. Tachycardia present.     Pulses: Normal pulses.     Heart sounds: Normal heart sounds. No murmur heard.   No friction rub.  Pulmonary:     Effort: Pulmonary effort is normal.     Breath sounds: Wheezing, rhonchi and rales present.     Comments: Coarse breath sounds throughout with decreased breath sounds in the left lower lobe Abdominal:     General: Bowel sounds are normal. There is no distension.     Palpations: Abdomen is soft.     Tenderness: There is no abdominal tenderness. There is no guarding or rebound.  Musculoskeletal:        General: No tenderness. Normal range of motion.     Cervical back: Normal range of motion and neck supple.     Right lower leg: Edema present.     Left lower leg: Edema present.     Comments: Pitting edema in the bilateral lower extremities 1+.  No erythema or tenderness  Skin:    General: Skin is warm and dry.     Capillary Refill: Capillary refill takes less than 2 seconds.     Findings: No rash.  Neurological:     Mental Status: She is alert and oriented to person, place, and time. Mental status is at baseline.     Cranial Nerves: No cranial nerve deficit.  Psychiatric:        Mood and Affect: Mood normal.        Behavior: Behavior normal.    ED Results / Procedures / Treatments   Labs (all labs ordered are listed, but only abnormal results are displayed) Labs Reviewed  CBC WITH DIFFERENTIAL/PLATELET - Abnormal; Notable for the following components:      Result Value   HCT 35.8 (*)    All other components within normal limits  BLOOD GAS, VENOUS - Abnormal; Notable for the following components:   pO2, Ven <32.0 (*)    Bicarbonate 31.6 (*)    Acid-Base Excess 5.2 (*)    All other components within normal limits  RESP PANEL BY RT-PCR (FLU A&B, COVID) ARPGX2  COMPREHENSIVE METABOLIC PANEL  BRAIN NATRIURETIC PEPTIDE  I-STAT VENOUS BLOOD GAS, ED  TROPONIN  I (HIGH SENSITIVITY)  TROPONIN I (HIGH SENSITIVITY)    EKG EKG Interpretation  Date/Time:  Saturday November 27 2020 22:59:58 EDT Ventricular Rate:  105 PR Interval:  135 QRS Duration: 97 QT Interval:  346 QTC Calculation: 458 R Axis:   76 Text Interpretation: Sinus tachycardia LVH with secondary repolarization abnormality 12 Lead; [place] No significant change since last tracing Confirmed by Blanchie Dessert ([id number]) on 11/27/2020 11:08:28 PM  Radiology DG Chest Port 1 View  Result Date: 11/27/2020 CLINICAL DATA:  Cough, shortness of breath EXAM: PORTABLE CHEST 1 VIEW COMPARISON:  11/19/2020 FINDINGS: Heart is normal size. Left lower lobe consolidation, worsening since prior study. Right lung clear. No visible effusions or acute bony abnormality. IMPRESSION: Worsening left lower lobe airspace opacity could reflect worsening atelectasis or infiltrate. Electronically Signed   By: Rolm Baptise M.D.   On: 11/27/2020 22:12    Procedures Procedures   Medications Ordered in ED Medications  azithromycin (ZITHROMAX) 500 mg in sodium chloride 0.9 % 250 mL IVPB (has no administration in time range)  albuterol (PROVENTIL) (2.5 MG/3ML) 0.083% nebulizer solution 5 mg (5 mg Nebulization Given 11/27/20 2205)  ipratropium (ATROVENT) nebulizer solution 0.5 mg (0.5 mg Nebulization Given 11/27/20 2205)  cefTRIAXone (ROCEPHIN) 1 g in sodium chloride  0.9 % 100 mL IVPB (1 g Intravenous New Bag/Given 11/27/20 2244)    ED Course  I have reviewed the triage vital signs and the nursing notes.  Pertinent labs & imaging results that were available during my care of the patient were reviewed by me and considered in my medical decision making (see chart for details).    MDM Rules/Calculators/A&P                          70 year old female with multiple medical problems presenting today with 1 month of worsening shortness of breath.  Patient found to be hypoxic here despite trying nebs at home.  She is placed  on 4 L oxygen with sats now at 93%.  Patient is persistently tachycardic but denies any fever or productive cough.  Also has a prior history of CHF and last echo in 2021 showed an EF of 45 to 50%.  She has continued her Delene Loll and also been taking 20 mg of Lasix daily.  Patient has been losing weight and had poor appetite.  She does appear to be fluid overloaded mildly at this time.  Chest x-ray today with left lower lobe infiltrate concerning for pneumonia and patient was covered with Rocephin and azithromycin as patient was unable to take antibiotics that she was given to a month ago.  CBC within normal limits with stable hemoglobin.  Blood gas with normal pH and CO2 of 57.  Patient was given albuterol and Atrovent for wheezing and rhonchi.  CMP, BNP and troponin are pending.  EKG without acute changes except for tachycardia.  Final Clinical Impression(s) / ED Diagnoses Final diagnoses:  None    Rx / DC Orders ED Discharge Orders     None        Blanchie Dessert, MD 11/27/20 2355

## 2020-11-27 NOTE — ED Provider Notes (Signed)
Emergency Medicine Provider Triage Evaluation Note  Sherry Bender , a 70 y.o. female  was evaluated in triage.  Pt complains of shortness of breath.  She has COPD and is usually not on oxygen at home.  She was at 87 on room air so we put her on 1 L of oxygen here.  She states she has been feeling increasingly short of breath the last month but today it worsened.  She has been coughing a lot.  States she was diagnosed with pneumonia, but she is unable to take her antibiotics amoxicillin and doxycycline due to the vomiting that they cause.  Review of Systems  Positive: Shortness of breath, cough Negative: Fever, chest pain  Physical Exam  BP (!) 184/98 (BP Location: Right Arm)   Pulse 96   Temp 98.1 F (36.7 C) (Oral)   Resp 15   Ht 5\' 4"  (1.626 m)   Wt 41.7 kg   LMP 02/28/1999   SpO2 91%   BMI 15.79 kg/m  Gen:   Awake, no distress   Resp:  Normal effort. O2 now 94 on 1 L. Speaking in complete sentences, some wheezing on exam.  MSK:   Moves extremities without difficulty  Other:    Medical Decision Making  Medically screening exam initiated at 7:50 PM.  Appropriate orders placed.  Ahleah Simko was informed that the remainder of the evaluation will be completed by another provider, this initial triage assessment does not replace that evaluation, and the importance of remaining in the ED until their evaluation is complete.     Sherrill Raring, PA-C 11/27/20 1953    Quintella Reichert, MD 11/28/20 [phone]

## 2020-11-27 NOTE — ED Provider Notes (Signed)
  Provider Note MRN:  [id number]  Arrival date & time: 11/28/20    ED Course and Medical Decision Making  Assumed care from Dr. Maryan Rued at shift change.  Clinically suspicious for pneumonia, possibly contributing COPD versus CHF.  Awaiting labs, patient requiring 4 L nasal cannula, not on oxygen at home, will need admission.  Admitted to hospital service for further care.  Procedures  Final Clinical Impressions(s) / ED Diagnoses     ICD-10-CM   1. COPD exacerbation ([place])  J44.1     2. Community acquired pneumonia, unspecified laterality  J18.9       ED Discharge Orders     None       Discharge Instructions   None     Barth Kirks. [place]onia Small, [place] [email or web address]    Maudie Flakes, MD 11/28/20 0700

## 2020-11-27 NOTE — ED Notes (Signed)
Patient had a bowel movement in bed. The RN and I assisted her to the bedside commode and cleaned her bed.

## 2020-11-27 NOTE — ED Notes (Signed)
Pulled into room by tech. Patient noted to be minimally distressed, but hypoxic. O2 increased to 4L from 3L with patient symptom improvement.

## 2020-11-28 ENCOUNTER — Observation Stay (HOSPITAL_COMMUNITY): Payer: 59

## 2020-11-28 ENCOUNTER — Encounter: Payer: Self-pay | Admitting: Nurse Practitioner

## 2020-11-28 DIAGNOSIS — Z981 Arthrodesis status: Secondary | ICD-10-CM | POA: Diagnosis not present

## 2020-11-28 DIAGNOSIS — I2510 Atherosclerotic heart disease of native coronary artery without angina pectoris: Secondary | ICD-10-CM | POA: Diagnosis present

## 2020-11-28 DIAGNOSIS — Z85828 Personal history of other malignant neoplasm of skin: Secondary | ICD-10-CM | POA: Diagnosis not present

## 2020-11-28 DIAGNOSIS — J189 Pneumonia, unspecified organism: Secondary | ICD-10-CM | POA: Diagnosis present

## 2020-11-28 DIAGNOSIS — I10 Essential (primary) hypertension: Secondary | ICD-10-CM | POA: Diagnosis present

## 2020-11-28 DIAGNOSIS — E871 Hypo-osmolality and hyponatremia: Secondary | ICD-10-CM | POA: Diagnosis present

## 2020-11-28 DIAGNOSIS — R600 Localized edema: Secondary | ICD-10-CM | POA: Diagnosis not present

## 2020-11-28 DIAGNOSIS — R0609 Other forms of dyspnea: Secondary | ICD-10-CM

## 2020-11-28 DIAGNOSIS — K219 Gastro-esophageal reflux disease without esophagitis: Secondary | ICD-10-CM | POA: Diagnosis present

## 2020-11-28 DIAGNOSIS — J9601 Acute respiratory failure with hypoxia: Secondary | ICD-10-CM

## 2020-11-28 DIAGNOSIS — I361 Nonrheumatic tricuspid (valve) insufficiency: Secondary | ICD-10-CM | POA: Diagnosis not present

## 2020-11-28 DIAGNOSIS — I429 Cardiomyopathy, unspecified: Secondary | ICD-10-CM | POA: Diagnosis present

## 2020-11-28 DIAGNOSIS — F419 Anxiety disorder, unspecified: Secondary | ICD-10-CM | POA: Diagnosis present

## 2020-11-28 DIAGNOSIS — Z853 Personal history of malignant neoplasm of breast: Secondary | ICD-10-CM | POA: Diagnosis not present

## 2020-11-28 DIAGNOSIS — Z8249 Family history of ischemic heart disease and other diseases of the circulatory system: Secondary | ICD-10-CM | POA: Diagnosis not present

## 2020-11-28 DIAGNOSIS — Z79899 Other long term (current) drug therapy: Secondary | ICD-10-CM | POA: Diagnosis not present

## 2020-11-28 DIAGNOSIS — Z87891 Personal history of nicotine dependence: Secondary | ICD-10-CM | POA: Diagnosis not present

## 2020-11-28 DIAGNOSIS — R059 Cough, unspecified: Secondary | ICD-10-CM | POA: Insufficient documentation

## 2020-11-28 DIAGNOSIS — Z809 Family history of malignant neoplasm, unspecified: Secondary | ICD-10-CM | POA: Diagnosis not present

## 2020-11-28 DIAGNOSIS — Z8711 Personal history of peptic ulcer disease: Secondary | ICD-10-CM | POA: Diagnosis not present

## 2020-11-28 DIAGNOSIS — Z20822 Contact with and (suspected) exposure to covid-19: Secondary | ICD-10-CM | POA: Diagnosis present

## 2020-11-28 DIAGNOSIS — Z9012 Acquired absence of left breast and nipple: Secondary | ICD-10-CM | POA: Diagnosis not present

## 2020-11-28 DIAGNOSIS — J440 Chronic obstructive pulmonary disease with acute lower respiratory infection: Secondary | ICD-10-CM | POA: Diagnosis present

## 2020-11-28 DIAGNOSIS — R0602 Shortness of breath: Secondary | ICD-10-CM | POA: Diagnosis present

## 2020-11-28 DIAGNOSIS — Z9049 Acquired absence of other specified parts of digestive tract: Secondary | ICD-10-CM | POA: Diagnosis not present

## 2020-11-28 DIAGNOSIS — E785 Hyperlipidemia, unspecified: Secondary | ICD-10-CM | POA: Diagnosis present

## 2020-11-28 DIAGNOSIS — Z833 Family history of diabetes mellitus: Secondary | ICD-10-CM | POA: Diagnosis not present

## 2020-11-28 DIAGNOSIS — Z90710 Acquired absence of both cervix and uterus: Secondary | ICD-10-CM | POA: Diagnosis not present

## 2020-11-28 DIAGNOSIS — D649 Anemia, unspecified: Secondary | ICD-10-CM | POA: Diagnosis present

## 2020-11-28 LAB — COMPREHENSIVE METABOLIC PANEL
ALT: 16 U/L (ref 0–44)
AST: 18 U/L (ref 15–41)
Albumin: 3.6 g/dL (ref 3.5–5.0)
Alkaline Phosphatase: 61 U/L (ref 38–126)
Anion gap: 12 (ref 5–15)
BUN: 5 mg/dL — ABNORMAL LOW (ref 8–23)
CO2: 27 mmol/L (ref 22–32)
Calcium: 9.3 mg/dL (ref 8.9–10.3)
Chloride: 86 mmol/L — ABNORMAL LOW (ref 98–111)
Creatinine, Ser: <0.3 mg/dL — ABNORMAL LOW (ref 0.44–1.00)
Glucose, Bld: 112 mg/dL — ABNORMAL HIGH (ref 70–99)
Potassium: 3.7 mmol/L (ref 3.5–5.1)
Sodium: 125 mmol/L — ABNORMAL LOW (ref 135–145)
Total Bilirubin: 0.3 mg/dL (ref 0.3–1.2)
Total Protein: 7.5 g/dL (ref 6.5–8.1)

## 2020-11-28 LAB — CBC
HCT: 36.7 % (ref 36.0–46.0)
Hemoglobin: 12.1 g/dL (ref 12.0–15.0)
MCH: 28.9 pg (ref 26.0–34.0)
MCHC: 33 g/dL (ref 30.0–36.0)
MCV: 87.8 fL (ref 80.0–100.0)
Platelets: 224 10*3/uL (ref 150–400)
RBC: 4.18 MIL/uL (ref 3.87–5.11)
RDW: 13.3 % (ref 11.5–15.5)
WBC: 10.6 10*3/uL — ABNORMAL HIGH (ref 4.0–10.5)
nRBC: 0 % (ref 0.0–0.2)

## 2020-11-28 LAB — RESP PANEL BY RT-PCR (FLU A&B, COVID) ARPGX2
Influenza A by PCR: NEGATIVE
Influenza B by PCR: NEGATIVE
SARS Coronavirus 2 by RT PCR: NEGATIVE

## 2020-11-28 LAB — COMPREHENSIVE METABOLIC PANEL WITH GFR
ALT: 18 U/L (ref 0–44)
AST: 19 U/L (ref 15–41)
Albumin: 4 g/dL (ref 3.5–5.0)
Alkaline Phosphatase: 71 U/L (ref 38–126)
Anion gap: 13 (ref 5–15)
BUN: 9 mg/dL (ref 8–23)
CO2: 30 mmol/L (ref 22–32)
Calcium: 9.8 mg/dL (ref 8.9–10.3)
Chloride: 83 mmol/L — ABNORMAL LOW (ref 98–111)
Creatinine, Ser: <0.3 mg/dL — ABNORMAL LOW (ref 0.44–1.00)
Glucose, Bld: 107 mg/dL — ABNORMAL HIGH (ref 70–99)
Potassium: 3.8 mmol/L (ref 3.5–5.1)
Sodium: 126 mmol/L — ABNORMAL LOW (ref 135–145)
Total Bilirubin: 0.7 mg/dL (ref 0.3–1.2)
Total Protein: 8.4 g/dL — ABNORMAL HIGH (ref 6.5–8.1)

## 2020-11-28 LAB — TROPONIN I (HIGH SENSITIVITY)
Troponin I (High Sensitivity): 13 ng/L (ref <18)
Troponin I (High Sensitivity): 19 ng/L — ABNORMAL HIGH (ref <18)

## 2020-11-28 LAB — BRAIN NATRIURETIC PEPTIDE: B Natriuretic Peptide: 52.5 pg/mL (ref 0.0–100.0)

## 2020-11-28 LAB — ECHOCARDIOGRAM COMPLETE
Area-P 1/2: 4.68 cm2
Height: 64 in
S' Lateral: 2.9 cm
Weight: 1472 oz

## 2020-11-28 LAB — OSMOLALITY, URINE: Osmolality, Ur: 378 mOsm/kg (ref 300–900)

## 2020-11-28 LAB — MAGNESIUM: Magnesium: 1.5 mg/dL — ABNORMAL LOW (ref 1.7–2.4)

## 2020-11-28 LAB — HIV ANTIBODY (ROUTINE TESTING W REFLEX): HIV Screen 4th Generation wRfx: NONREACTIVE

## 2020-11-28 LAB — STREP PNEUMONIAE URINARY ANTIGEN: Strep Pneumo Urinary Antigen: NEGATIVE

## 2020-11-28 MED ORDER — ALBUTEROL SULFATE (2.5 MG/3ML) 0.083% IN NEBU: 2.5000 mg | INHALATION_SOLUTION | RESPIRATORY_TRACT | Status: DC | PRN

## 2020-11-28 MED ORDER — IPRATROPIUM-ALBUTEROL 0.5-2.5 (3) MG/3ML IN SOLN
3.0000 mL | Freq: Four times a day (QID) | RESPIRATORY_TRACT | Status: DC
  Administered 2020-11-28: 3 mL via RESPIRATORY_TRACT
  Filled 2020-11-28: qty 3

## 2020-11-28 MED ORDER — ACETAMINOPHEN 650 MG RE SUPP: 650.0000 mg | Freq: Four times a day (QID) | RECTAL | Status: DC | PRN

## 2020-11-28 MED ORDER — ONDANSETRON HCL 4 MG PO TABS: 4.0000 mg | ORAL_TABLET | Freq: Four times a day (QID) | ORAL | Status: DC | PRN

## 2020-11-28 MED ORDER — ATORVASTATIN CALCIUM 10 MG PO TABS
20.0000 mg | ORAL_TABLET | Freq: Every day | ORAL | Status: DC
  Administered 2020-11-28 – 2020-12-01 (×4): 20 mg via ORAL
  Filled 2020-11-28 (×4): qty 2

## 2020-11-28 MED ORDER — ENOXAPARIN SODIUM 30 MG/0.3ML IJ SOSY
30.0000 mg | PREFILLED_SYRINGE | INTRAMUSCULAR | Status: DC
  Administered 2020-11-28 – 2020-12-01 (×4): 30 mg via SUBCUTANEOUS
  Filled 2020-11-28 (×5): qty 0.3

## 2020-11-28 MED ORDER — ONDANSETRON HCL 4 MG/2ML IJ SOLN: 4.0000 mg | Freq: Four times a day (QID) | INTRAMUSCULAR | Status: DC | PRN

## 2020-11-28 MED ORDER — ASPIRIN EC 81 MG PO TBEC
81.0000 mg | DELAYED_RELEASE_TABLET | Freq: Every day | ORAL | Status: DC
  Administered 2020-11-28 – 2020-12-01 (×4): 81 mg via ORAL
  Filled 2020-11-28 (×4): qty 1

## 2020-11-28 MED ORDER — SODIUM CHLORIDE 0.9 % IV SOLN
2.0000 g | INTRAVENOUS | Status: DC
  Administered 2020-11-28 – 2020-11-29 (×2): 2 g via INTRAVENOUS
  Filled 2020-11-28: qty 2
  Filled 2020-11-28: qty 20

## 2020-11-28 MED ORDER — FUROSEMIDE 20 MG PO TABS: 20.0000 mg | ORAL_TABLET | Freq: Every day | ORAL | Status: DC | PRN

## 2020-11-28 MED ORDER — MOMETASONE FURO-FORMOTEROL FUM 200-5 MCG/ACT IN AERO
2.0000 | INHALATION_SPRAY | Freq: Two times a day (BID) | RESPIRATORY_TRACT | Status: DC
  Administered 2020-11-28 – 2020-12-01 (×7): 2 via RESPIRATORY_TRACT
  Filled 2020-11-28: qty 8.8

## 2020-11-28 MED ORDER — MAGNESIUM SULFATE 2 GM/50ML IV SOLN
2.0000 g | Freq: Once | INTRAVENOUS | Status: AC
  Administered 2020-11-28: 2 g via INTRAVENOUS
  Filled 2020-11-28: qty 50

## 2020-11-28 MED ORDER — PANTOPRAZOLE SODIUM 40 MG PO TBEC
40.0000 mg | DELAYED_RELEASE_TABLET | Freq: Every day | ORAL | Status: DC
  Administered 2020-11-28 – 2020-12-01 (×4): 40 mg via ORAL
  Filled 2020-11-28 (×4): qty 1

## 2020-11-28 MED ORDER — SACUBITRIL-VALSARTAN 24-26 MG PO TABS
1.0000 | ORAL_TABLET | Freq: Two times a day (BID) | ORAL | Status: DC
  Administered 2020-11-28 – 2020-12-01 (×7): 1 via ORAL
  Filled 2020-11-28 (×7): qty 1

## 2020-11-28 MED ORDER — ACETAMINOPHEN 325 MG PO TABS
650.0000 mg | ORAL_TABLET | Freq: Four times a day (QID) | ORAL | Status: DC | PRN
  Administered 2020-11-28 – 2020-11-29 (×2): 650 mg via ORAL
  Filled 2020-11-28 (×3): qty 2

## 2020-11-28 MED ORDER — SODIUM CHLORIDE 0.9 % IV SOLN: INTRAVENOUS | Status: DC

## 2020-11-28 MED ORDER — METHYLPREDNISOLONE SODIUM SUCC 125 MG IJ SOLR
60.0000 mg | Freq: Two times a day (BID) | INTRAMUSCULAR | Status: AC
  Administered 2020-11-28 (×2): 60 mg via INTRAVENOUS
  Filled 2020-11-28 (×2): qty 2

## 2020-11-28 MED ORDER — POTASSIUM CHLORIDE CRYS ER 10 MEQ PO TBCR
40.0000 meq | EXTENDED_RELEASE_TABLET | Freq: Once | ORAL | Status: AC
  Administered 2020-11-28: 40 meq via ORAL
  Filled 2020-11-28: qty 4

## 2020-11-28 MED ORDER — PREDNISONE 20 MG PO TABS
40.0000 mg | ORAL_TABLET | Freq: Every day | ORAL | Status: DC
  Administered 2020-11-29 – 2020-12-01 (×3): 40 mg via ORAL
  Filled 2020-11-28 (×3): qty 2

## 2020-11-28 MED ORDER — IPRATROPIUM-ALBUTEROL 0.5-2.5 (3) MG/3ML IN SOLN
3.0000 mL | Freq: Three times a day (TID) | RESPIRATORY_TRACT | Status: DC
  Administered 2020-11-28 – 2020-12-01 (×8): 3 mL via RESPIRATORY_TRACT
  Filled 2020-11-28 (×8): qty 3

## 2020-11-28 MED ORDER — PERFLUTREN LIPID MICROSPHERE
1.0000 mL | INTRAVENOUS | Status: AC | PRN
Start: 2020-11-28 — End: 2020-11-28
  Administered 2020-11-28: 3 mL via INTRAVENOUS
  Filled 2020-11-28: qty 10

## 2020-11-28 MED ORDER — SODIUM CHLORIDE 0.9 % IV SOLN
500.0000 mg | INTRAVENOUS | Status: DC
  Administered 2020-11-28 – 2020-11-29 (×2): 500 mg via INTRAVENOUS
  Filled 2020-11-28 (×2): qty 500

## 2020-11-28 MED ORDER — FESOTERODINE FUMARATE ER 4 MG PO TB24
4.0000 mg | ORAL_TABLET | Freq: Every day | ORAL | Status: DC
  Administered 2020-11-28 – 2020-12-01 (×4): 4 mg via ORAL
  Filled 2020-11-28 (×4): qty 1

## 2020-11-28 MED ORDER — DM-GUAIFENESIN ER 30-600 MG PO TB12
1.0000 | ORAL_TABLET | Freq: Two times a day (BID) | ORAL | Status: DC
  Administered 2020-11-28 – 2020-12-01 (×7): 1 via ORAL
  Filled 2020-11-28 (×7): qty 1

## 2020-11-28 MED ORDER — CARVEDILOL 12.5 MG PO TABS
12.5000 mg | ORAL_TABLET | Freq: Two times a day (BID) | ORAL | Status: DC
  Administered 2020-11-28 – 2020-12-01 (×6): 12.5 mg via ORAL
  Filled 2020-11-28 (×7): qty 1

## 2020-11-28 NOTE — Assessment & Plan Note (Addendum)
Symptoms of cough gradually improving in the last few days.  No alcohol or wheezing observed/assessed in clinic today.  Patient discontinue doxycycline due to the swelling and itching mouth and wants new antibiotics today.  Augmentin 875-125 mg tablet sent to pharmacy.  Guaifenesin for cough and congestion.  Advised patient to take medication as prescribed.  Printed handouts given.  Rx sent to pharmacy.  Follow-up with worsening or unresolved symptoms.

## 2020-11-28 NOTE — H&P (Signed)
TRH H&P    Patient Demographics:    Sherry Bender, is a 70 y.o. female  MRN: [id number]  DOB - Dec 15, 1950  Admit Date - 11/27/2020  Referring MD/NP/PA: Sedonia Small  Outpatient Primary MD for the patient is Sharion Balloon, FNP  Patient coming from: Home  Chief complaint- Dyspnea   HPI:    Sherry Bender  is a 70 y.o. female, with history of anxiety, breast cancer, cardiomyopathy, GERD, hyperlipidemia, hypertension, COPD, and more presents to the ER with a chief complaint of dyspnea.  Patient reports that she has noticed dyspnea, wheezing, and continuous cough.  She reports this started sometime ago but got worse on the day of presentation.  She reports that worsening symptoms include a continuous cough.  She had been diagnosed with pneumonia on June 3 but did not take her antibiotic because it made her nauseous.  She reports that she was on amoxicillin 875 mg.  She reports that her dyspnea is worse on exertion and better with rest.  It is also worse with laying flat.  She has no peripheral edema.  She denies any fevers.  She does use a daily inhaler, and occasionally uses a rescue inhaler.  Prior to arrival she used her rescue inhaler and it was temporarily beneficial.  She denies any chest pain, but reports she does have shoulder blade pain from laying in an awkward bed.  She has palpitations when she coughs.  She reports that her cough is nonproductive.  She had no sick contacts.  She is vaccinated for COVID.  Patient does not smoke, does not drink alcohol, does not use illicit drugs.  Patient is full code.  In the ED Temp 98.1, heart rate 96-1 04, respiratory rate 15-29, blood pressure 184/98 down to 146/69 at admission, oxygen saturation 94% on 1 L nasal cannula pH 7.359, CO2 57, O2 less than 32.0 and venous blood Negative respiratory panel No leukocytosis with a white blood cell count of 7.8, hemoglobin  12.0 Hyponatremia and hypochloremia at 126 and 83 BNP 525 [place] 13 Chest x-ray shows worsening left lower lobe infiltrate EKG shows LVH, sinus tachycardia, rate of 105, QTc 458 Admission requested for management of pneumonia and COPD exacerbation     Review of systems:    In addition to the HPI above,  No Fever-chills, No Headache, No changes with Vision or hearing, No problems swallowing food or Liquids, Admits to cough and dyspnea No Abdominal pain, No Nausea or Vomiting, bowel movements are regular, No Blood in stool or Urine, No dysuria, No new skin rashes or bruises, Admits to some myalgias No new weakness, tingling, numbness in any extremity, No recent weight gain or loss, No polyuria, polydypsia or polyphagia, No significant Mental Stressors.  All other systems reviewed and are negative.    Past History of the following :    Past Medical History:  Diagnosis Date   Anxiety    Arthritis    Breast cancer (Sherry Bender) 2016   Cardiomyopathy ([place])    a. EF 25-30% by echocardiogram in 02/2019 b.  30-35% in 05/2019 c. normalized to 55-60% by echo in 02/2020   Coronary atherosclerosis    Cardiac CTA with negative FFR, nonobstructive March 2021   Cricopharyngeus muscle dysfunction    Gastric ulcer    GERD (gastroesophageal reflux disease)    Hiatal hernia    Hyperlipidemia    Hypertension    PONV (postoperative nausea and vomiting)    Scoliosis    Scoliosis    Skin cancer    Urinary frequency       Past Surgical History:  Procedure Laterality Date   ABDOMINAL HYSTERECTOMY  2000   TAH/BSO   ANTERIOR CERVICAL DECOMP/DISCECTOMY FUSION N/A 06/18/2014   Procedure: C4-5 C5-6 C6-7 ANTERIOR CERVICAL DECOMPRESSION/DISKECTOMY/FUSION;  Surgeon: Eustace Moore, MD;  Location: [facility];  Service: Neurosurgery;  Laterality: N/A;  C4-5 C5-6 C6-7 ANTERIOR CERVICAL DECOMPRESSION/DISKECTOMY/FUSION   ANTERIOR CERVICAL DECOMP/DISCECTOMY FUSION N/A 06/25/2014   Procedure: EXPLORATION  OF CERVICAL FUSION WITH REVIOSN OF HARDWARE;  Surgeon: Eustace Moore, MD;  Location: [facility];  Service: Neurosurgery;  Laterality: N/A;   BIOPSY  01/01/2020   Procedure: BIOPSY;  Surgeon: Mauri Pole, MD;  Location: [facility];  Service: Endoscopy;;   CERVICAL FUSION     CHOLECYSTECTOMY  1994   COMPLETE MASTECTOMY W/ SENTINEL NODE BIOPSY Left 04/30/2015   ESOPHAGOGASTRODUODENOSCOPY (EGD) WITH PROPOFOL N/A 01/01/2020   Procedure: ESOPHAGOGASTRODUODENOSCOPY (EGD) WITH PROPOFOL;  Surgeon: Mauri Pole, MD;  Location: [facility];  Service: Endoscopy;  Laterality: N/A;   FOOT NEUROMA SURGERY  01/2010   also had metatarasl shortened   KNEE ARTHROSCOPY  2005   right knee   MASTECTOMY Left 2016   PORT-A-CATH REMOVAL Right 04/30/2015   Procedure: REMOVAL PORT-A-CATH;  Surgeon: Fanny Skates, MD;  Location: [place];  Service: General;  Laterality: Right;   PORTACATH PLACEMENT N/A 11/23/2014   Procedure: INSERTION PORT-A-CATH WITH ULTRASOUND;  Surgeon: Fanny Skates, MD;  Location: [place];  Service: General;  Laterality: N/A;   REVERSE SHOULDER ARTHROPLASTY Left 07/15/2020   Procedure: REVERSE SHOULDER ARTHROPLASTY;  Surgeon: Mordecai Rasmussen, MD;  Location: [facility];  Service: Orthopedics;  Laterality: Left;   RHINOPLASTY     SIMPLE MASTECTOMY WITH AXILLARY SENTINEL NODE BIOPSY Left 04/30/2015   Procedure: LEFT TOTAL  MASTECTOMY WITH LEFT AXILLARY SENTINEL NODE BIOPSY;  Surgeon: Fanny Skates, MD;  Location: [place];  Service: General;  Laterality: Left;      Social History:      Social History   Tobacco Use   Smoking status: Former    Packs/day: 0.25    Years: 42.00    Pack years: 10.50    Types: Cigarettes    Quit date: 01/24/2017    Years since quitting: 3.8   Smokeless tobacco: Never  Substance Use Topics   Alcohol use: No       Family History :     Family History  Problem Relation Age of Onset   Heart disease Mother    Hypertension Mother     Diabetes Mother    Heart disease Father    Hypertension Father    Cancer Maternal Grandmother        ? ovarian cancer vs. cervical   Cancer Paternal Aunt        ? breast      Home Medications:   Prior to Admission medications   Medication Sig Start Date End Date Taking? Authorizing Provider  albuterol (PROVENTIL) (2.5 MG/3ML) 0.083% nebulizer solution Take 3 mLs (2.5  mg total) by nebulization every 6 (six) hours as needed for wheezing or shortness of breath. 11/02/20  Yes Hawks, Christy A, FNP  albuterol (VENTOLIN HFA) 108 (90 Base) MCG/ACT inhaler Inhale 2 puffs into the lungs every 6 (six) hours as needed for wheezing or shortness of breath.   Yes [provider]  aspirin EC 81 MG tablet Take 1 tablet (81 mg total) by mouth daily. Swallow whole. 10/08/20  Yes Strader, Fourche, PA-C  atorvastatin (LIPITOR) 20 MG tablet TAKE 1 TABLET BY MOUTH EVERY DAY 09/01/20  Yes Satira Sark, MD  carvedilol (COREG) 3.125 MG tablet Take 0.5 tablets (1.55 mg total) by mouth in the morning and at bedtime. 11/09/20  Yes Satira Sark, MD  dextromethorphan-guaiFENesin [facility] DM) 30-600 MG 12hr tablet Take 1 tablet by mouth 2 (two) times daily. 11/26/20  Yes Ivy Lynn, NP  diphenhydrAMINE (BENADRYL) 25 MG tablet Take 25 mg by mouth at bedtime.   Yes [provider]  ENTRESTO 24-26 MG TAKE 1 TABLET BY MOUTH 2 (TWO) TIMES DAILY. DUE FOR FOLLOW UP APPT Patient taking differently: Take 1 tablet by mouth 2 (two) times daily. 05/31/20  Yes BranchAlphonse Guild, MD  esomeprazole (NEXIUM) 40 MG capsule Take 1 capsule (40 mg total) by mouth daily. 05/18/20 05/18/21 Yes Hawks, Christy A, FNP  fluticasone-salmeterol (ADVAIR DISKUS) 250-50 MCG/ACT AEPB Inhale 1 puff into the lungs in the morning and at bedtime. Patient taking differently: Inhale 1 puff into the lungs 2 (two) times daily as needed (sob/wheezing). 11/23/20  Yes Gwenlyn Perking, FNP  furosemide (LASIX) 20 MG tablet Take  1 tablet (20 mg total) by mouth daily. Patient taking differently: Take 20 mg by mouth daily as needed for fluid or edema. 11/09/20  Yes Claretta Fraise, MD  ibuprofen (ADVIL) 200 MG tablet Take 400 mg by mouth every 6 (six) hours as needed for moderate pain.   Yes [provider]  Lidocaine 4 % PTCH Apply 1 patch topically daily as needed (pain).   Yes [provider]  Propylene Glycol (SYSTANE BALANCE) 0.6 % SOLN Place 1 drop into both eyes daily as needed (dry eyes).   Yes [provider]  sodium chloride (OCEAN) 0.65 % SOLN nasal spray Place 1 spray into both nostrils as needed for congestion.   Yes [provider]  tolterodine (DETROL LA) 2 MG 24 hr capsule Take 1 capsule (2 mg total) by mouth daily. 03/16/20  Yes Hawks, Christy A, FNP  triamcinolone ointment (KENALOG) 0.5 % Apply 1 application topically 2 (two) times daily. Patient taking differently: Apply 1 application topically 2 (two) times daily as needed (rash). 11/04/20  Yes Nicholas Lose, MD  amoxicillin-clavulanate (AUGMENTIN) 875-125 MG tablet Take 1 tablet by mouth 2 (two) times daily. Patient not taking: No sig reported 11/26/20   Ivy Lynn, NP  estradiol (ESTRACE) 1 MG tablet TAKE 1 TABLET BY MOUTH EVERY DAY Patient not taking: Reported on 11/28/2020 08/14/19   Dettinger, Fransisca Kaufmann, MD  ondansetron (ZOFRAN) 4 MG tablet Take 1 tablet (4 mg total) by mouth every 8 (eight) hours as needed for nausea or vomiting. 11/25/20   Sharion Balloon, FNP     Allergies:     Allergies  Allergen Reactions   Hydrocodone Other (See Comments)    Patient does not want again, Its makes her sick.   Codeine Nausea And Vomiting   Spironolactone     Significant weakness, cramping along lower extremities  Physical Exam:   Vitals  Blood pressure (!) 158/85, pulse (!) 102, temperature 97.6 F (36.4 C), temperature source Oral, resp. rate 20, height 5\' 4"  (1.626 m), weight 41.7 kg, last menstrual period  02/28/1999, SpO2 93 %.  1.  General: Patient lying supine in bed,  no acute distress   2. Psychiatric: Alert and oriented x 3, mood and behavior normal for situation, pleasant and cooperative with exam   3. Neurologic: Speech and language are normal, face is symmetric, moves all 4 extremities voluntarily, at baseline without acute deficits on limited exam   4. HEENMT:  Head is atraumatic, normocephalic, pupils reactive to light, neck is supple, trachea is midline, mucous membranes are moist   5. Respiratory : Wheezing on exam without rhonchi, rales, no cyanosis, barrel chested, accessory muscle use with increased work of breathing   6. Cardiovascular : Heart rate tachycardic rhythm is regular, no murmurs, rubs or gallops, no peripheral edema, peripheral pulses palpated   7. Gastrointestinal:  Abdomen is soft, nondistended, nontender to palpation bowel sounds active, no masses or organomegaly palpated   8. Skin:  Skin is warm, dry and intact without rashes, acute lesions, or ulcers on limited exam   9.Musculoskeletal:  No acute deformities or trauma, no asymmetry in tone, no peripheral edema, peripheral pulses palpated, no tenderness to palpation in the extremities     Data Review:    CBC Recent Labs  Lab 11/27/20 2241  WBC 7.8  HGB 12.0  HCT 35.8*  PLT 242  MCV 86.1  MCH 28.8  MCHC 33.5  RDW 13.4  LYMPHSABS 0.7  MONOABS 0.6  EOSABS 0.1  BASOSABS 0.0   ------------------------------------------------------------------------------------------------------------------  Results for orders placed or performed during the hospital encounter of 11/27/20 (from the past 48 hour(s))  CBC with Differential/Platelet     Status: Abnormal   Collection Time: 11/27/20 10:41 PM  Result Value Ref Range   WBC 7.8 4.0 - 10.5 K/uL   RBC 4.16 3.87 - 5.11 MIL/uL   Hemoglobin 12.0 12.0 - 15.0 g/dL   HCT 35.8 (L) 36.0 - 46.0 %   MCV 86.1 80.0 - 100.0 fL   MCH 28.8 26.0 - 34.0 pg    MCHC 33.5 30.0 - 36.0 g/dL   RDW 13.4 11.5 - 15.5 %   Platelets 242 150 - 400 K/uL   nRBC 0.0 0.0 - 0.2 %   Neutrophils Relative % 82 %   Neutro Abs 6.3 1.7 - 7.7 K/uL   Lymphocytes Relative 9 %   Lymphs Abs 0.7 0.7 - 4.0 K/uL   Monocytes Relative 7 %   Monocytes Absolute 0.6 0.1 - 1.0 K/uL   Eosinophils Relative 1 %   Eosinophils Absolute 0.1 0.0 - 0.5 K/uL   Basophils Relative 0 %   Basophils Absolute 0.0 0.0 - 0.1 K/uL   Immature Granulocytes 1 %   Abs Immature Granulocytes 0.04 0.00 - 0.07 K/uL    Comment: Performed at [facility], [place] [address] [place], [place] [address]  Comprehensive metabolic panel     Status: Abnormal   Collection Time: 11/27/20 10:41 PM  Result Value Ref Range   Sodium 126 (L) 135 - 145 mmol/L   Potassium 3.8 3.5 - 5.1 mmol/L   Chloride 83 (L) 98 - 111 mmol/L   CO2 30 22 - 32 mmol/L   Glucose, Bld 107 (H) 70 - 99 mg/dL    Comment: Glucose reference range applies only to samples taken after fasting for at  least 8 hours.   BUN 9 8 - 23 mg/dL   Creatinine, Ser <0.30 (L) 0.44 - 1.00 mg/dL   Calcium 9.8 8.9 - 10.3 mg/dL   Total Protein 8.4 (H) 6.5 - 8.1 g/dL   Albumin 4.0 3.5 - 5.0 g/dL   AST 19 15 - 41 U/L   ALT 18 0 - 44 U/L   Alkaline Phosphatase 71 38 - 126 U/L   Total Bilirubin 0.7 0.3 - 1.2 mg/dL   GFR, Estimated NOT CALCULATED >60 mL/min    Comment: (NOTE) Calculated using the CKD-EPI Creatinine Equation (2021)    Anion gap 13 5 - 15    Comment: Performed at [facility], [place] [address] [place], [place] [address]  Brain natriuretic peptide     Status: None   Collection Time: 11/27/20 10:41 PM  Result Value Ref Range   B Natriuretic Peptide 52.5 0.0 - 100.0 pg/mL    Comment: Performed at [facility], [place] [address] [place], [place] [address]  Troponin I (High Sensitivity)     Status: None   Collection Time: 11/27/20 10:41 PM  Result Value Ref Range   Troponin I (High  Sensitivity) 13 <18 ng/L    Comment: (NOTE) Elevated high sensitivity troponin I (hsTnI) values and significant  changes across serial measurements may suggest ACS but many other  chronic and acute conditions are known to elevate hsTnI results.  Refer to the Links section for chest pain algorithms and additional  guidance. Performed at [facility], [place] [address], [place], [place] [address]   Blood gas, venous (at [facility] and [facility], not at [facility])     Status: Abnormal   Collection Time: 11/27/20 10:41 PM  Result Value Ref Range   pH, Ven 7.359 7.250 - 7.430   pCO2, Ven 57.6 44.0 - 60.0 mmHg   pO2, Ven <32.0 (LL) 32.0 - 45.0 mmHg    Comment: CRITICAL RESULT CALLED TO, READ BACK BY AND VERIFIED WITH: SARA DOSTER AT 2316 ON 11/27/20 BY MAJ    Bicarbonate 31.6 (H) 20.0 - 28.0 mmol/L   Acid-Base Excess 5.2 (H) 0.0 - 2.0 mmol/L   O2 Saturation 28.8 %   Patient temperature 37.0     Comment: Performed at [facility], [place] [address] [place], [place] [address]  Resp Panel by RT-PCR (Flu A&B, Covid) Nasopharyngeal Swab     Status: None   Collection Time: 11/27/20 10:43 PM   Specimen: Nasopharyngeal Swab; Nasopharyngeal(NP) swabs in vial transport medium  Result Value Ref Range   SARS Coronavirus 2 by RT PCR NEGATIVE NEGATIVE    Comment: (NOTE) SARS-CoV-2 target nucleic acids are NOT DETECTED.  The SARS-CoV-2 RNA is generally detectable in upper respiratory specimens during the acute phase of infection. The lowest concentration of SARS-CoV-2 viral copies this assay can detect is 138 copies/mL. A negative result does not preclude SARS-Cov-2 infection and should not be used as the sole basis for treatment or other patient management decisions. A negative result may occur with  improper specimen collection/handling, submission of specimen other than nasopharyngeal swab, presence of viral mutation(s) within the areas targeted by this assay, and inadequate number  of viral copies(<138 copies/mL). A negative result must be combined with clinical observations, patient history, and epidemiological information. The expected result is Negative.  Fact Sheet for Patients:  EntrepreneurPulse.com.au  Fact Sheet for Healthcare Providers:  IncredibleEmployment.be  This test is no t yet approved or cleared by the [place]  FDA and  has been authorized for detection and/or diagnosis of SARS-CoV-2 by FDA under an Emergency Use Authorization (EUA). This EUA will remain  in effect (meaning this test can be used) for the duration of the COVID-19 declaration under Section 564(b)(1) of the Act, 21 U.S.C.section 360bbb-3(b)(1), unless the authorization is terminated  or revoked sooner.       Influenza A by PCR NEGATIVE NEGATIVE   Influenza B by PCR NEGATIVE NEGATIVE    Comment: (NOTE) The Xpert Xpress SARS-CoV-2/FLU/RSV plus assay is intended as an aid in the diagnosis of influenza from Nasopharyngeal swab specimens and should not be used as a sole basis for treatment. Nasal washings and aspirates are unacceptable for Xpert Xpress SARS-CoV-2/FLU/RSV testing.  Fact Sheet for Patients: EntrepreneurPulse.com.au  Fact Sheet for Healthcare Providers: IncredibleEmployment.be  This test is not yet approved or cleared by the [place] FDA and has been authorized for detection and/or diagnosis of SARS-CoV-2 by FDA under an Emergency Use Authorization (EUA). This EUA will remain in effect (meaning this test can be used) for the duration of the COVID-19 declaration under Section 564(b)(1) of the Act, 21 U.S.C. section 360bbb-3(b)(1), unless the authorization is terminated or revoked.  Performed at [facility], [place] [address], [place], [place] [address]   Troponin I (High Sensitivity)     Status: Abnormal   Collection Time: 11/27/20 11:48 PM  Result Value Ref  Range   Troponin I (High Sensitivity) 19 (H) <18 ng/L    Comment: (NOTE) Elevated high sensitivity troponin I (hsTnI) values and significant  changes across serial measurements may suggest ACS but many other  chronic and acute conditions are known to elevate hsTnI results.  Refer to the "Links" section for chest pain algorithms and additional  guidance. Performed at [facility], [place] [address] [place], [place] [address]     Chemistries  Recent Labs  Lab 11/27/20 2241  NA 126*  K 3.8  CL 83*  CO2 30  GLUCOSE 107*  BUN 9  CREATININE <0.30*  CALCIUM 9.8  AST 19  ALT 18  ALKPHOS 71  BILITOT 0.7   ------------------------------------------------------------------------------------------------------------------  ------------------------------------------------------------------------------------------------------------------ GFR: CrCl cannot be calculated (This lab value cannot be used to calculate CrCl because it is not a number: <0.30). Liver Function Tests: Recent Labs  Lab 11/27/20 2241  AST 19  ALT 18  ALKPHOS 71  BILITOT 0.7  PROT 8.4*  ALBUMIN 4.0   No results for input(s): LIPASE, AMYLASE in the last 168 hours. No results for input(s): AMMONIA in the last 168 hours. Coagulation Profile: No results for input(s): INR, PROTIME in the last 168 hours. Cardiac Enzymes: No results for input(s): CKTOTAL, CKMB, CKMBINDEX, TROPONINI in the last 168 hours. BNP (last 3 results) No results for input(s): PROBNP in the last 8760 hours. HbA1C: No results for input(s): HGBA1C in the last 72 hours. CBG: No results for input(s): GLUCAP in the last 168 hours. Lipid Profile: No results for input(s): CHOL, HDL, LDLCALC, TRIG, CHOLHDL, LDLDIRECT in the last 72 hours. Thyroid Function Tests: No results for input(s): TSH, T4TOTAL, FREET4, T3FREE, THYROIDAB in the last 72 hours. Anemia Panel: No results for input(s): VITAMINB12, FOLATE, FERRITIN, TIBC,  IRON, RETICCTPCT in the last 72 hours.  --------------------------------------------------------------------------------------------------------------- Urine analysis:    Component Value Date/Time   COLORURINE YELLOW 03/16/2009 [place] 03/16/2009 1403   LABSPEC 1.005 12/07/2014 1302   PHURINE 7.5 12/07/2014 1302   PHURINE 6.0 03/16/2009 1403  GLUCOSEU Negative 12/07/2014 1302   HGBUR Negative 12/07/2014 1302   HGBUR NEGATIVE 03/16/2009 1403   BILIRUBINUR Negative 12/07/2014 1302   KETONESUR Negative 12/07/2014 1302   KETONESUR NEGATIVE 03/16/2009 1403   PROTEINUR Negative 12/07/2014 1302   PROTEINUR NEGATIVE 03/16/2009 1403   UROBILINOGEN 0.2 12/07/2014 1302   NITRITE Negative 12/07/2014 1302   NITRITE NEGATIVE 03/16/2009 1403   LEUKOCYTESUR Negative 12/07/2014 1302      Imaging Results:    DG Chest Port 1 View  Result Date: 11/27/2020 CLINICAL DATA:  Cough, shortness of breath EXAM: PORTABLE CHEST 1 VIEW COMPARISON:  11/19/2020 FINDINGS: Heart is normal size. Left lower lobe consolidation, worsening since prior study. Right lung clear. No visible effusions or acute bony abnormality. IMPRESSION: Worsening left lower lobe airspace opacity could reflect worsening atelectasis or infiltrate. Electronically Signed   By: Rolm Baptise M.D.   On: 11/27/2020 22:12      Assessment & Plan:    Active Problems:   Acute respiratory failure with hypoxia (HCC)   Hyponatremia   Community acquired pneumonia   Lower extremity edema   Acute respiratory failure with hypoxia  Sats down to 87%, PO2 less than 32.0 on blood gas Chest x-ray shows worsening infiltrate Trope stable EKG without ischemic changes There is some concern for CHF exacerbation with the elevated BNP at 525, history of CHF.  Patient endorses orthopnea, but no peripheral edema no vascular congestion on chest x-ray.  Monitor closely, obtain echo from 2021 Continue treatment as below Wean off O2 as  tolerated Community-acquired pneumonia Continue Rocephin and Zithromax Culture expectorated sputum Continue breathing treatments as needed Continue to monitor Hyponatremia Gentle IV hydration Encourage p.o. intake Asymmetric lower extremity edema on the right Patient reports that this is new although she does have a history of CHF so is possible that she gets peripheral edema Ultrasound lower extremity   DVT Prophylaxis-   Lovenox - SCDs   AM Labs Ordered, also please review Full Orders  Family Communication: No family at bedside  Code Status: Full  Admission status: Observation Time spent in minutes : [place] DO

## 2020-11-28 NOTE — Progress Notes (Signed)
[facility] PROGRESS NOTE   Sherry Bender MVE:[id number] DOB: [birth year] DOA: 11/27/2020  PCP: Sharion Balloon, FNP  Brief History/Interval Summary: 70 y.o. female, with history of anxiety, breast cancer, cardiomyopathy, GERD, hyperlipidemia, hypertension, COPD, who presented to the ER with a chief complaint of dyspnea.  Symptoms have been ongoing for several days.  She apparently was seen by an outpatient provider and was started on antibiotics earlier this month but did not take it because it made her nauseated.  Noted to be hypoxic in the emergency department.  Chest x-ray showed left lower lobe infiltrate.  She was hospitalized for further management.      Consultants: None  Procedures: None  Antibiotics: Anti-infectives (From admission, onward)    Start     Dose/Rate Route Frequency Ordered Stop   11/28/20 2359  azithromycin (ZITHROMAX) 500 mg in sodium chloride 0.9 % 250 mL IVPB        500 mg 250 mL/hr over 60 Minutes Intravenous Every 24 hours 11/28/20 0326 12/03/20 2359   11/28/20 2200  cefTRIAXone (ROCEPHIN) 2 g in sodium chloride 0.9 % 100 mL IVPB        2 g 200 mL/hr over 30 Minutes Intravenous Every 24 hours 11/28/20 0326 12/03/20 2159   11/27/20 2230  cefTRIAXone (ROCEPHIN) 1 g in sodium chloride 0.9 % 100 mL IVPB        1 g 200 mL/hr over 30 Minutes Intravenous  Once 11/27/20 2220 11/28/20 0017   11/27/20 2230  azithromycin (ZITHROMAX) 500 mg in sodium chloride 0.9 % 250 mL IVPB        500 mg 250 mL/hr over 60 Minutes Intravenous  Once 11/27/20 2220 11/28/20 0259       Subjective/Interval History: Patient mentions that she continues to have shortness of breath and a dry cough.  Denies any chest pain.  No nausea or vomiting.  No abdominal pain.  Does not feel like she has leg swelling.  Denies any pain in the legs.    Assessment/Plan:  Community-acquired pneumonia/acute respiratory failure with hypoxia Saturations on room air were noted to be 87%.   Currently on 2 L with sats in the 90s. Patient found to have left sided pneumonia.  Started on azithromycin and ceftriaxone which will be continued.  Mobilize patient.  Flutter valve. Patient was apparently also wheezing in the emergency department and was started on steroids.  No wheezing appreciated currently.  Continue to monitor.  Hyponatremia Most likely due to hypovolemia.  Could also be a component of SIADH.  She is getting IV fluids.  We will recheck labs tomorrow morning.  We will check urine osmolality.  Hypomagnesemia This will be repleted.  History of cardiomyopathy Noted to have low EF of 30 to 35% and 2020.  Based on echocardiogram done in September 2021 her EF had recovered. Noted to be on carvedilol and Entresto which is being continued.  Seems to be stable from a cardiac standpoint.  Hyperlipidemia Continue statin.  Essential hypertension Continue home medications.  Monitor blood pressures closely.  Concern for lower extremity edema Her legs did not show any pitting edema.  They look equal in size to me and the patient this morning.  She does not want to undergo Doppler study which will be canceled.  No calf tenderness was present.   DVT Prophylaxis: Lovenox Code Status: Full code Family Communication: Discussed with the patient.  No family at bedside Disposition Plan: Hopefully return home when improved  Status is: Observation  The patient will require care spanning > 2 midnights and should be moved to inpatient because: IV treatments appropriate due to intensity of illness or inability to take PO and Inpatient level of care appropriate due to severity of illness  Dispo: The patient is from: Home              Anticipated d/c is to: Home              Patient currently is not medically stable to d/c.   Difficult to place patient No       Medications: Scheduled:  aspirin EC  81 mg Oral Daily   atorvastatin  20 mg Oral Daily   carvedilol  12.5 mg Oral BID  WC   dextromethorphan-guaiFENesin  1 tablet Oral BID   enoxaparin (LOVENOX) injection  30 mg Subcutaneous Q24H   fesoterodine  4 mg Oral Daily   ipratropium-albuterol  3 mL Nebulization Q6H   methylPREDNISolone (SOLU-MEDROL) injection  60 mg Intravenous Q12H   Followed by   Derrill Memo ON 11/29/2020] predniSONE  40 mg Oral Q breakfast   mometasone-formoterol  2 puff Inhalation BID   pantoprazole  40 mg Oral Daily   sacubitril-valsartan  1 tablet Oral BID   Continuous:  sodium chloride 75 mL/hr at 11/28/20 0347   azithromycin     cefTRIAXone (ROCEPHIN)  IV     IPJ:[id number] **OR** acetaminophen, albuterol, furosemide, ondansetron **OR** ondansetron (ZOFRAN) IV   Objective:  Vital Signs  Vitals:   11/28/20 0100 11/28/20 0245 11/28/20 0334 11/28/20 0749  BP:  137/73 (!) 158/85 120/79  Pulse: (!) 106 95 (!) 102 100  Resp: 20 (!) 22 20 (!) 22  Temp:  97.8 F (36.6 C) 97.6 F (36.4 C) 97.7 F (36.5 C)  TempSrc:  Oral Oral Oral  SpO2: 100% 100% 93% 96%  Weight:      Height:       No intake or output data in the 24 hours ending 11/28/20 0929 Filed Weights   11/27/20 1944  Weight: 41.7 kg    General appearance: Awake alert.  In no distress Resp: Noted to be tachypneic without use of accessory muscles.  Crackles on the left base.  No wheezing appreciated today. Cardio: S1-S2 is normal regular.  No S3-S4.  No rubs murmurs or bruit GI: Abdomen is soft.  Nontender nondistended.  Bowel sounds are present normal.  No masses organomegaly Extremities: No edema.  Full range of motion of lower extremities. Neurologic: Alert and oriented x3.  No focal neurological deficits.    Lab Results:  Data Reviewed: I have personally reviewed following labs and imaging studies  CBC: Recent Labs  Lab 11/27/20 2241 11/28/20 0725  WBC 7.8 10.6*  NEUTROABS 6.3  --   HGB 12.0 12.1  HCT 35.8* 36.7  MCV 86.1 87.8  PLT 242 937    Basic Metabolic Panel: Recent Labs  Lab 11/27/20 2241  11/28/20 0725  NA 126* 125*  K 3.8 3.7  CL 83* 86*  CO2 30 27  GLUCOSE 107* 112*  BUN 9 5*  CREATININE <0.30* <0.30*  CALCIUM 9.8 9.3  MG  --  1.5*    GFR: CrCl cannot be calculated (This lab value cannot be used to calculate CrCl because it is not a number: <0.30).  Liver Function Tests: Recent Labs  Lab 11/27/20 2241 11/28/20 0725  AST 19 18  ALT 18 16  ALKPHOS 71 61  BILITOT 0.7 0.3  PROT 8.4* 7.5  ALBUMIN 4.0  3.6      Recent Results (from the past 240 hour(s))  Resp Panel by RT-PCR (Flu A&B, Covid) Nasopharyngeal Swab     Status: None   Collection Time: 11/27/20 10:43 PM   Specimen: Nasopharyngeal Swab; Nasopharyngeal(NP) swabs in vial transport medium  Result Value Ref Range Status   SARS Coronavirus 2 by RT PCR NEGATIVE NEGATIVE Final    Comment: (NOTE) SARS-CoV-2 target nucleic acids are NOT DETECTED.  The SARS-CoV-2 RNA is generally detectable in upper respiratory specimens during the acute phase of infection. The lowest concentration of SARS-CoV-2 viral copies this assay can detect is 138 copies/mL. A negative result does not preclude SARS-Cov-2 infection and should not be used as the sole basis for treatment or other patient management decisions. A negative result may occur with  improper specimen collection/handling, submission of specimen other than nasopharyngeal swab, presence of viral mutation(s) within the areas targeted by this assay, and inadequate number of viral copies(<138 copies/mL). A negative result must be combined with clinical observations, patient history, and epidemiological information. The expected result is Negative.  Fact Sheet for Patients:  EntrepreneurPulse.com.au  Fact Sheet for Healthcare Providers:  IncredibleEmployment.be  This test is no t yet approved or cleared by the [place] FDA and  has been authorized for detection and/or diagnosis of SARS-CoV-2 by FDA under an Emergency  Use Authorization (EUA). This EUA will remain  in effect (meaning this test can be used) for the duration of the COVID-19 declaration under Section 564(b)(1) of the Act, 21 U.S.C.section 360bbb-3(b)(1), unless the authorization is terminated  or revoked sooner.       Influenza A by PCR NEGATIVE NEGATIVE Final   Influenza B by PCR NEGATIVE NEGATIVE Final    Comment: (NOTE) The Xpert Xpress SARS-CoV-2/FLU/RSV plus assay is intended as an aid in the diagnosis of influenza from Nasopharyngeal swab specimens and should not be used as a sole basis for treatment. Nasal washings and aspirates are unacceptable for Xpert Xpress SARS-CoV-2/FLU/RSV testing.  Fact Sheet for Patients: EntrepreneurPulse.com.au  Fact Sheet for Healthcare Providers: IncredibleEmployment.be  This test is not yet approved or cleared by the [place] FDA and has been authorized for detection and/or diagnosis of SARS-CoV-2 by FDA under an Emergency Use Authorization (EUA). This EUA will remain in effect (meaning this test can be used) for the duration of the COVID-19 declaration under Section 564(b)(1) of the Act, 21 U.S.C. section 360bbb-3(b)(1), unless the authorization is terminated or revoked.  Performed at [facility], [place] [address], [place], [place] [address]       Radiology Studies: [facility] Chest Port 1 View  Result Date: 11/27/2020 CLINICAL DATA:  Cough, shortness of breath EXAM: PORTABLE CHEST 1 VIEW COMPARISON:  11/19/2020 FINDINGS: Heart is normal size. Left lower lobe consolidation, worsening since prior study. Right lung clear. No visible effusions or acute bony abnormality. IMPRESSION: Worsening left lower lobe airspace opacity could reflect worsening atelectasis or infiltrate. Electronically Signed   By: Rolm Baptise M.D.   On: 11/27/2020 22:12       LOS: 0 days   [facility] Pager on [email or web address]  11/28/2020,  9:29 AM

## 2020-11-28 NOTE — Progress Notes (Signed)
Acute Office Visit  Subjective:    Patient ID: Sherry Bender, female    DOB: [birth year], 70 y.o.   MRN: [id number]  Chief Complaint  Patient presents with   Cough   Wheezing    Cough This is a recurrent problem. The current episode started in the past 7 days. The problem has been gradually improving. The cough is Non-productive. Pertinent negatives include no chest pain, chills, ear pain, nasal congestion, postnasal drip or sore throat. Nothing aggravates the symptoms. She has tried oral steroids and rest for the symptoms. The treatment provided mild relief.  Wheezing  The problem has been gradually improving. Associated symptoms include coughing. Pertinent negatives include no chest pain, chills, ear pain or sore throat.   Past Medical History:  Diagnosis Date   Anxiety    Arthritis    Breast cancer ([place]) 2016   Cardiomyopathy ([place])    a. EF 25-30% by echocardiogram in 02/2019 b. 30-35% in 05/2019 c. normalized to 55-60% by echo in 02/2020   Coronary atherosclerosis    Cardiac CTA with negative FFR, nonobstructive March 2021   Cricopharyngeus muscle dysfunction    Gastric ulcer    GERD (gastroesophageal reflux disease)    Hiatal hernia    Hyperlipidemia    Hypertension    PONV (postoperative nausea and vomiting)    Scoliosis    Scoliosis    Skin cancer    Urinary frequency     Past Surgical History:  Procedure Laterality Date   ABDOMINAL HYSTERECTOMY  2000   TAH/BSO   ANTERIOR CERVICAL DECOMP/DISCECTOMY FUSION N/A 06/18/2014   Procedure: C4-5 C5-6 C6-7 ANTERIOR CERVICAL DECOMPRESSION/DISKECTOMY/FUSION;  Surgeon: Eustace Moore, MD;  Location: [facility];  Service: Neurosurgery;  Laterality: N/A;  C4-5 C5-6 C6-7 ANTERIOR CERVICAL DECOMPRESSION/DISKECTOMY/FUSION   ANTERIOR CERVICAL DECOMP/DISCECTOMY FUSION N/A 06/25/2014   Procedure: EXPLORATION OF CERVICAL FUSION WITH REVIOSN OF HARDWARE;  Surgeon: Eustace Moore, MD;  Location: [facility];  Service: Neurosurgery;   Laterality: N/A;   BIOPSY  01/01/2020   Procedure: BIOPSY;  Surgeon: Mauri Pole, MD;  Location: [facility];  Service: Endoscopy;;   CERVICAL FUSION     CHOLECYSTECTOMY  1994   COMPLETE MASTECTOMY W/ SENTINEL NODE BIOPSY Left 04/30/2015   ESOPHAGOGASTRODUODENOSCOPY (EGD) WITH PROPOFOL N/A 01/01/2020   Procedure: ESOPHAGOGASTRODUODENOSCOPY (EGD) WITH PROPOFOL;  Surgeon: Mauri Pole, MD;  Location: [facility];  Service: Endoscopy;  Laterality: N/A;   FOOT NEUROMA SURGERY  01/2010   also had metatarasl shortened   KNEE ARTHROSCOPY  2005   right knee   MASTECTOMY Left 2016   PORT-A-CATH REMOVAL Right 04/30/2015   Procedure: REMOVAL PORT-A-CATH;  Surgeon: Fanny Skates, MD;  Location: [facility];  Service: General;  Laterality: Right;   PORTACATH PLACEMENT N/A 11/23/2014   Procedure: INSERTION PORT-A-CATH WITH ULTRASOUND;  Surgeon: Fanny Skates, MD;  Location: [facility];  Service: General;  Laterality: N/A;   REVERSE SHOULDER ARTHROPLASTY Left 07/15/2020   Procedure: REVERSE SHOULDER ARTHROPLASTY;  Surgeon: Mordecai Rasmussen, MD;  Location: [facility];  Service: Orthopedics;  Laterality: Left;   RHINOPLASTY     SIMPLE MASTECTOMY WITH AXILLARY SENTINEL NODE BIOPSY Left 04/30/2015   Procedure: LEFT TOTAL  MASTECTOMY WITH LEFT AXILLARY SENTINEL NODE BIOPSY;  Surgeon: Fanny Skates, MD;  Location: [facility];  Service: General;  Laterality: Left;    Family History  Problem Relation Age of Onset   Heart disease Mother    Hypertension Mother    Diabetes Mother  Heart disease Father    Hypertension Father    Cancer Maternal Grandmother        ? ovarian cancer vs. cervical   Cancer Paternal Aunt        ? breast    Social History   Socioeconomic History   Marital status: Married    Spouse name: Not on file   Number of children: 2   Years of education: Not on file   Highest education level: Not on file  Occupational History   Not on file  Tobacco Use   Smoking  status: Former    Packs/day: 0.25    Years: 42.00    Pack years: 10.50    Types: Cigarettes    Quit date: 01/24/2017    Years since quitting: 3.8   Smokeless tobacco: Never  Vaping Use   Vaping Use: Never used  Substance and Sexual Activity   Alcohol use: No   Drug use: No   Sexual activity: Never  Other Topics Concern   Not on file  Social History Narrative   Caffeine Use: 1 soda weekly   Regular exercise:  No   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     Two daughters born 50 and 77- one in [place] one in [place].             Social Determinants of Health   Financial Resource Strain: Not on file  Food Insecurity: Not on file  Transportation Needs: Not on file  Physical Activity: Not on file  Stress: Not on file  Social Connections: Not on file  Intimate Partner Violence: Not on file    No facility-administered medications prior to visit.   Outpatient Medications Prior to Visit  Medication Sig Dispense Refill   albuterol (PROVENTIL) (2.5 MG/3ML) 0.083% nebulizer solution Take 3 mLs (2.5 mg total) by nebulization every 6 (six) hours as needed for wheezing or shortness of breath. 150 mL 1   albuterol (VENTOLIN HFA) 108 (90 Base) MCG/ACT inhaler Inhale 2 puffs into the lungs every 6 (six) hours as needed for wheezing or shortness of breath.     aspirin EC 81 MG tablet Take 1 tablet (81 mg total) by mouth daily. Swallow whole. 90 tablet 3   atorvastatin (LIPITOR) 20 MG tablet TAKE 1 TABLET BY MOUTH EVERY DAY 90 tablet 1   carvedilol (COREG) 3.125 MG tablet Take 0.5 tablets (1.55 mg total) by mouth in the morning and at bedtime. 90 tablet 3   diphenhydrAMINE (BENADRYL) 25 MG tablet Take 25 mg by mouth at bedtime.     ENTRESTO 24-26 MG TAKE 1 TABLET BY MOUTH 2 (TWO) TIMES DAILY. DUE FOR FOLLOW UP APPT (Patient taking differently: Take 1 tablet by mouth 2 (two) times daily.) 180 tablet 3   esomeprazole (NEXIUM) 40 MG capsule  Take 1 capsule (40 mg total) by mouth daily. 90 capsule 4   estradiol (ESTRACE) 1 MG tablet TAKE 1 TABLET BY MOUTH EVERY DAY 90 tablet 1   fluticasone-salmeterol (ADVAIR DISKUS) 250-50 MCG/ACT AEPB Inhale 1 puff into the lungs in the morning and at bedtime. 60 each 0   furosemide (LASIX) 20 MG tablet Take 1 tablet (20 mg total) by mouth daily. 10 tablet 0   ibuprofen (ADVIL) 200 MG tablet Take 400 mg by mouth every 6 (six) hours as needed for moderate pain.     lidocaine (LIDODERM) 5 % Place 1 patch onto the skin daily. (Patient taking  differently: Place 1 patch onto the skin daily as needed (pain).) 30 patch 2   Lidocaine 4 % PTCH Apply 1 patch topically daily as needed (pain).     ondansetron (ZOFRAN) 4 MG tablet Take 1 tablet (4 mg total) by mouth every 8 (eight) hours as needed for nausea or vomiting. 20 tablet 0   Propylene Glycol (SYSTANE BALANCE) 0.6 % SOLN Place 1 drop into both eyes daily as needed (dry eyes).     sodium chloride (OCEAN) 0.65 % SOLN nasal spray Place 1 spray into both nostrils as needed for congestion.     tolterodine (DETROL LA) 2 MG 24 hr capsule Take 1 capsule (2 mg total) by mouth daily. 90 capsule 3   triamcinolone ointment (KENALOG) 0.5 % Apply 1 application topically 2 (two) times daily. 30 g 3   doxycycline (VIBRA-TABS) 100 MG tablet Take 1 tablet (100 mg total) by mouth 2 (two) times daily for 10 days. 1 po bid 20 tablet 0    Allergies  Allergen Reactions   Hydrocodone Other (See Comments)    Patient does not want again, Its makes her sick.   Codeine Nausea And Vomiting   Spironolactone     Significant weakness, cramping along lower extremities    Review of Systems  Constitutional:  Negative for chills.  HENT:  Negative for ear pain, postnasal drip and sore throat.   Respiratory:  Positive for cough.   Cardiovascular:  Negative for chest pain.      Objective:    Physical Exam  BP 130/62   Pulse 90   Temp 98.3 F (36.8 C) (Temporal)   LMP  02/28/1999   SpO2 98%  Wt Readings from Last 3 Encounters:  11/27/20 92 lb (41.7 kg)  10/08/20 92 lb 9.6 oz (42 kg)  06/29/20 91 lb 0.8 oz (41.3 kg)    Health Maintenance Due  Topic Date Due   TETANUS/TDAP  Never done   Zoster Vaccines- Shingrix (1 of 2) Never done   COLONOSCOPY (Pts 45-53yr Insurance coverage will need to be confirmed)  06/20/2007   DEXA SCAN  Never done   PNA vac Low Risk Adult (1 of 2 - PCV13) Never done   COVID-19 Vaccine (3 - Moderna risk series) 10/20/2019   MAMMOGRAM  11/10/2019    There are no preventive care reminders to display for this patient.   Lab Results  Component Value Date   TSH 1.510 12/26/2018   Lab Results  Component Value Date   WBC 7.8 11/27/2020   HGB 12.0 11/27/2020   HCT 35.8 (L) 11/27/2020   MCV 86.1 11/27/2020   PLT 242 11/27/2020   Lab Results  Component Value Date   NA 126 (L) 11/27/2020   K 3.8 11/27/2020   CHLORIDE 90 (L) 01/24/2017   CO2 30 11/27/2020   GLUCOSE 107 (H) 11/27/2020   BUN 9 11/27/2020   CREATININE <0.30 (L) 11/27/2020   BILITOT 0.7 11/27/2020   ALKPHOS 71 11/27/2020   AST 19 11/27/2020   ALT 18 11/27/2020   PROT 8.4 (H) 11/27/2020   ALBUMIN 4.0 11/27/2020   CALCIUM 9.8 11/27/2020   ANIONGAP 13 11/27/2020   EGFR >90 01/24/2017   Lab Results  Component Value Date   CHOL 138 09/12/2019   Lab Results  Component Value Date   HDL 68 09/12/2019   Lab Results  Component Value Date   LDLCALC 58 09/12/2019   Lab Results  Component Value Date   TRIG 58 09/12/2019  Lab Results  Component Value Date   CHOLHDL 2.0 09/12/2019   No results found for: HGBA1C     Assessment & Plan:   Problem List Items Addressed This Visit       Other   Cough in adult - Primary    Symptoms of cough gradually improving in the last few days.  No alcohol or wheezing observed/assessed in clinic today.  Patient discontinue doxycycline due to the swelling and itching mouth and wants new antibiotics today.   Augmentin 875-125 mg tablet sent to pharmacy.  Guaifenesin for cough and congestion.  Advised patient to take medication as prescribed.  Printed handouts given.  Rx sent to pharmacy.  Follow-up with worsening or unresolved symptoms.       Relevant Medications   dextromethorphan-guaiFENesin (MUCINEX DM) 30-600 MG 12hr tablet   amoxicillin-clavulanate (AUGMENTIN) 875-125 MG tablet     Meds ordered this encounter  Medications   dextromethorphan-guaiFENesin (MUCINEX DM) 30-600 MG 12hr tablet    Sig: Take 1 tablet by mouth 2 (two) times daily.    Dispense:  20 tablet    Refill:  0    Order Specific Question:   Supervising Provider    Answer:   Janora Norlander [[id number]]   amoxicillin-clavulanate (AUGMENTIN) 875-125 MG tablet    Sig: Take 1 tablet by mouth 2 (two) times daily.    Dispense:  14 tablet    Refill:  0    Order Specific Question:   Supervising Provider    Answer:   Janora Norlander [[id number]]     Ivy Lynn, NP

## 2020-11-28 NOTE — Progress Notes (Signed)
Lower extremity venous attempted. Patient refused examination.   Darlin Coco, RDMS, RVT.

## 2020-11-28 NOTE — Progress Notes (Signed)
  Echocardiogram 2D Echocardiogram has been performed.  Alane Hanssen G Grabiel Schmutz 11/28/2020, 11:00 AM

## 2020-11-29 LAB — BASIC METABOLIC PANEL
Anion gap: 7 (ref 5–15)
BUN: 7 mg/dL — ABNORMAL LOW (ref 8–23)
CO2: 26 mmol/L (ref 22–32)
Calcium: 8.9 mg/dL (ref 8.9–10.3)
Chloride: 96 mmol/L — ABNORMAL LOW (ref 98–111)
Creatinine, Ser: <0.3 mg/dL — ABNORMAL LOW (ref 0.44–1.00)
Glucose, Bld: 151 mg/dL — ABNORMAL HIGH (ref 70–99)
Potassium: 4.4 mmol/L (ref 3.5–5.1)
Sodium: 129 mmol/L — ABNORMAL LOW (ref 135–145)

## 2020-11-29 LAB — MAGNESIUM: Magnesium: 1.9 mg/dL (ref 1.7–2.4)

## 2020-11-29 LAB — CBC
HCT: 32.2 % — ABNORMAL LOW (ref 36.0–46.0)
Hemoglobin: 10.3 g/dL — ABNORMAL LOW (ref 12.0–15.0)
MCH: 28.9 pg (ref 26.0–34.0)
MCHC: 32 g/dL (ref 30.0–36.0)
MCV: 90.2 fL (ref 80.0–100.0)
Platelets: 220 10*3/uL (ref 150–400)
RBC: 3.57 MIL/uL — ABNORMAL LOW (ref 3.87–5.11)
RDW: 13.4 % (ref 11.5–15.5)
WBC: 5.6 10*3/uL (ref 4.0–10.5)
nRBC: 0 % (ref 0.0–0.2)

## 2020-11-29 LAB — LEGIONELLA PNEUMOPHILA SEROGP 1 UR AG: L. pneumophila Serogp 1 Ur Ag: NEGATIVE

## 2020-11-29 MED ORDER — CHLORHEXIDINE GLUCONATE CLOTH 2 % EX PADS
6.0000 | MEDICATED_PAD | Freq: Every day | CUTANEOUS | Status: DC
  Administered 2020-11-29 – 2020-11-30 (×2): 6 via TOPICAL

## 2020-11-29 MED ORDER — GUAIFENESIN 100 MG/5ML PO SOLN
5.0000 mL | ORAL | Status: DC | PRN
  Administered 2020-11-29 (×2): 100 mg via ORAL
  Filled 2020-11-29 (×2): qty 10

## 2020-11-29 NOTE — Progress Notes (Addendum)
[facility] PROGRESS NOTE   Sherry Bender JJH:[id number] DOB: 1950/08/17 DOA: 11/27/2020  PCP: Sharion Balloon, FNP  Brief History/Interval Summary: 70 y.o. female, with history of anxiety, breast cancer, cardiomyopathy, GERD, hyperlipidemia, hypertension, COPD, who presented to the ER with a chief complaint of dyspnea.  Symptoms have been ongoing for several days.  She apparently was seen by an outpatient provider and was started on antibiotics earlier this month but did not take it because it made her nauseated.  Noted to be hypoxic in the emergency department.  Chest x-ray showed left lower lobe infiltrate.  She was hospitalized for further management.      Consultants: None  Procedures: None  Antibiotics: Anti-infectives (From admission, onward)    Start     Dose/Rate Route Frequency Ordered Stop   11/28/20 2359  azithromycin (ZITHROMAX) 500 mg in sodium chloride 0.9 % 250 mL IVPB        500 mg 250 mL/hr over 60 Minutes Intravenous Every 24 hours 11/28/20 0326 12/03/20 2359   11/28/20 2200  cefTRIAXone (ROCEPHIN) 2 g in sodium chloride 0.9 % 100 mL IVPB        2 g 200 mL/hr over 30 Minutes Intravenous Every 24 hours 11/28/20 0326 12/03/20 2159   11/27/20 2230  cefTRIAXone (ROCEPHIN) 1 g in sodium chloride 0.9 % 100 mL IVPB        1 g 200 mL/hr over 30 Minutes Intravenous  Once 11/27/20 2220 11/28/20 0017   11/27/20 2230  azithromycin (ZITHROMAX) 500 mg in sodium chloride 0.9 % 250 mL IVPB        500 mg 250 mL/hr over 60 Minutes Intravenous  Once 11/27/20 2220 11/28/20 0259       Subjective/Interval History: Patient mentions that she continues to have a dry cough.  Denies any chest pain.  No nausea or vomiting.  No abdominal pain.  Shortness of breath has improved slightly compared to the last 2 days.     Assessment/Plan:  Community-acquired pneumonia/acute respiratory failure with hypoxia Saturations on room air were noted to be 87%.  Currently on 2 L with sats  in the 90s. Patient found to have left sided pneumonia.   Remains on ceftriaxone and azithromycin.  Flutter valve.  Expectorant cough medications have been prescribed. Respiratory status noted to be stable.  Remains on 2 L of oxygen saturating in the mid to late 90s.  Should be able to wean down her oxygen.  Maintain saturation greater than 92%  Remains on steroids, 4 more days.  Hyponatremia Most likely due to hypovolemia.  There could also be a component of SIADH.   Sodium level has improved to 129 with hydration.  Urine osmolality noted to be 378.  Not that significantly elevated.  Continue to monitor for now.  Continue IV fluids for another 24 hours.  Hypomagnesemia This was repleted.  Improved to 1.9.  History of cardiomyopathy Noted to have low EF of 30 to 35% in 2020.  Based on echocardiogram done in September 2021 her EF had recovered. Noted to be on carvedilol and Entresto which is being continued.  Seems to be stable from a cardiac standpoint. Echocardiogram was done during this hospitalization.  Shows EF to be 60 to 65% without any regional wall motion abnormalities.  Grade 1 diastolic dysfunction was noted.  No other significant abnormalities appreciated.  Hyperlipidemia Continue statin.  Essential hypertension Continue home medications.  Monitor blood pressures closely.  Normocytic anemia Drop in hemoglobin is dilutional.  No  evidence of overt blood loss.    DVT Prophylaxis: Lovenox Code Status: Full code Family Communication: Discussed with the patient.  No family at bedside Disposition Plan: Hopefully return home when improved.  Start mobilizing  Status is: Inpatient  Remains inpatient appropriate because:IV treatments appropriate due to intensity of illness or inability to take PO and Inpatient level of care appropriate due to severity of illness  Dispo: The patient is from: Home              Anticipated d/c is to: Home              Patient currently is not  medically stable to d/c.   Difficult to place patient No            Medications: Scheduled:  aspirin EC  81 mg Oral Daily   atorvastatin  20 mg Oral Daily   carvedilol  12.5 mg Oral BID WC   Chlorhexidine Gluconate Cloth  6 each Topical Daily   dextromethorphan-guaiFENesin  1 tablet Oral BID   enoxaparin (LOVENOX) injection  30 mg Subcutaneous Q24H   fesoterodine  4 mg Oral Daily   ipratropium-albuterol  3 mL Nebulization TID   mometasone-formoterol  2 puff Inhalation BID   pantoprazole  40 mg Oral Daily   predniSONE  40 mg Oral Q breakfast   sacubitril-valsartan  1 tablet Oral BID   Continuous:  sodium chloride 75 mL/hr at 11/29/20 0539   azithromycin 500 mg (11/28/20 2338)   cefTRIAXone (ROCEPHIN)  IV 2 g (11/28/20 2216)   IRC:VELFYBOFBPZWC **OR** acetaminophen, albuterol, guaiFENesin, ondansetron **OR** ondansetron (ZOFRAN) IV   Objective:  Vital Signs  Vitals:   11/28/20 2049 11/29/20 0410 11/29/20 0830 11/29/20 0857  BP:  (!) 113/93 (!) 112/93   Pulse:  91 87   Resp:  20    Temp:  (!) 97.5 F (36.4 C)    TempSrc:  Oral    SpO2: 99% 98%  99%  Weight:      Height:        Intake/Output Summary (Last 24 hours) at 11/29/2020 1131 Last data filed at 11/28/2020 1500 Gross per 24 hour  Intake 772.76 ml  Output --  Net 772.76 ml   Filed Weights   11/27/20 1944  Weight: 41.7 kg    General appearance: Awake alert.  In no distress Resp: Continues to have crackles bilaterally especially on the left.  No wheezing or rhonchi.  Normal effort noted today. Cardio: S1-S2 is normal regular.  No S3-S4.  No rubs murmurs or bruit GI: Abdomen is soft.  Nontender nondistended.  Bowel sounds are present normal.  No masses organomegaly Extremities: No edema.  Full range of motion of lower extremities. Neurologic: Alert and oriented x3.  No focal neurological deficits.     Lab Results:  Data Reviewed: I have personally reviewed following labs and imaging  studies  CBC: Recent Labs  Lab 11/27/20 2241 11/28/20 0725 11/29/20 0522  WBC 7.8 10.6* 5.6  NEUTROABS 6.3  --   --   HGB 12.0 12.1 10.3*  HCT 35.8* 36.7 32.2*  MCV 86.1 87.8 90.2  PLT 242 224 220     Basic Metabolic Panel: Recent Labs  Lab 11/27/20 2241 11/28/20 0725 11/29/20 0522  NA 126* 125* 129*  K 3.8 3.7 4.4  CL 83* 86* 96*  CO2 30 27 26   GLUCOSE 107* 112* 151*  BUN 9 5* 7*  CREATININE <0.30* <0.30* <0.30*  CALCIUM 9.8 9.3 8.9  MG  --  1.5* 1.9     GFR: CrCl cannot be calculated (This lab value cannot be used to calculate CrCl because it is not a number: <0.30).  Liver Function Tests: Recent Labs  Lab 11/27/20 2241 11/28/20 0725  AST 19 18  ALT 18 16  ALKPHOS 71 61  BILITOT 0.7 0.3  PROT 8.4* 7.5  ALBUMIN 4.0 3.6       Recent Results (from the past 240 hour(s))  Resp Panel by RT-PCR (Flu A&B, Covid) Nasopharyngeal Swab     Status: None   Collection Time: 11/27/20 10:43 PM   Specimen: Nasopharyngeal Swab; Nasopharyngeal(NP) swabs in vial transport medium  Result Value Ref Range Status   SARS Coronavirus 2 by RT PCR NEGATIVE NEGATIVE Final    Comment: (NOTE) SARS-CoV-2 target nucleic acids are NOT DETECTED.  The SARS-CoV-2 RNA is generally detectable in upper respiratory specimens during the acute phase of infection. The lowest concentration of SARS-CoV-2 viral copies this assay can detect is 138 copies/mL. A negative result does not preclude SARS-Cov-2 infection and should not be used as the sole basis for treatment or other patient management decisions. A negative result may occur with  improper specimen collection/handling, submission of specimen other than nasopharyngeal swab, presence of viral mutation(s) within the areas targeted by this assay, and inadequate number of viral copies(<138 copies/mL). A negative result must be combined with clinical observations, patient history, and epidemiological information. The expected result is  Negative.  Fact Sheet for Patients:  EntrepreneurPulse.com.au  Fact Sheet for Healthcare Providers:  IncredibleEmployment.be  This test is no t yet approved or cleared by the [place] FDA and  has been authorized for detection and/or diagnosis of SARS-CoV-2 by FDA under an Emergency Use Authorization (EUA). This EUA will remain  in effect (meaning this test can be used) for the duration of the COVID-19 declaration under Section 564(b)(1) of the Act, 21 U.S.C.section 360bbb-3(b)(1), unless the authorization is terminated  or revoked sooner.       Influenza A by PCR NEGATIVE NEGATIVE Final   Influenza B by PCR NEGATIVE NEGATIVE Final    Comment: (NOTE) The Xpert Xpress SARS-CoV-2/FLU/RSV plus assay is intended as an aid in the diagnosis of influenza from Nasopharyngeal swab specimens and should not be used as a sole basis for treatment. Nasal washings and aspirates are unacceptable for Xpert Xpress SARS-CoV-2/FLU/RSV testing.  Fact Sheet for Patients: EntrepreneurPulse.com.au  Fact Sheet for Healthcare Providers: IncredibleEmployment.be  This test is not yet approved or cleared by the [place] FDA and has been authorized for detection and/or diagnosis of SARS-CoV-2 by FDA under an Emergency Use Authorization (EUA). This EUA will remain in effect (meaning this test can be used) for the duration of the COVID-19 declaration under Section 564(b)(1) of the Act, 21 U.S.C. section 360bbb-3(b)(1), unless the authorization is terminated or revoked.  Performed at [facility], [place] [address], [place], [place] [address]        Radiology Studies: [facility] Chest Port 1 View  Result Date: 11/27/2020 CLINICAL DATA:  Cough, shortness of breath EXAM: PORTABLE CHEST 1 VIEW COMPARISON:  11/19/2020 FINDINGS: Heart is normal size. Left lower lobe consolidation, worsening since prior study. Right  lung clear. No visible effusions or acute bony abnormality. IMPRESSION: Worsening left lower lobe airspace opacity could reflect worsening atelectasis or infiltrate. Electronically Signed   By: Rolm Baptise M.D.   On: 11/27/2020 22:12   ECHOCARDIOGRAM COMPLETE  Result Date: 11/28/2020    ECHOCARDIOGRAM REPORT   Patient  Name:   Sherry Bender Date of Exam: 11/28/2020 Medical Rec #:  [id number]     Height:       64.0 in Accession #:    [id number]    Weight:       92.0 lb Date of Birth:  Jun 06, 1951     BSA:          1.406 m Patient Age:    66 years      BP:           120/79 mmHg Patient Gender: F             HR:           103 bpm. Exam Location:  Inpatient Procedure: 2D Echo, Cardiac Doppler, Color Doppler and Intracardiac            Opacification Agent Indications:    Dyspnea R06.00  History:        Patient has prior history of Echocardiogram examinations, most                 recent 02/27/2020. Cardiomyopathy; Risk Factors:Hypertension and                 Dyslipidemia. Cancer. GERD.  Sonographer:    Tiffany Dance Referring Phys: [id number] ASIA B [place]  1. Left ventricular ejection fraction, by estimation, is 60 to 65%. The left ventricle has normal function. The left ventricle has no regional wall motion abnormalities. Left ventricular diastolic parameters are consistent with Grade I diastolic dysfunction (impaired relaxation).  2. Right ventricular systolic function is normal. The right ventricular size is normal. There is mildly elevated pulmonary artery systolic pressure. The estimated right ventricular systolic pressure is 81.8 mmHg.  3. The mitral valve is normal in structure. No evidence of mitral valve regurgitation. No evidence of mitral stenosis.  4. The aortic valve is normal in structure. Aortic valve regurgitation is not visualized. No aortic stenosis is present.  5. The inferior vena cava is dilated in size with >50% respiratory variability, suggesting right atrial pressure of 8 mmHg.  FINDINGS  Left Ventricle: Left ventricular ejection fraction, by estimation, is 60 to 65%. The left ventricle has normal function. The left ventricle has no regional wall motion abnormalities. Definity contrast agent was given IV to delineate the left ventricular  endocardial borders. The left ventricular internal cavity size was normal in size. There is no left ventricular hypertrophy. Left ventricular diastolic parameters are consistent with Grade I diastolic dysfunction (impaired relaxation). Normal left ventricular filling pressure. Right Ventricle: The right ventricular size is normal. No increase in right ventricular wall thickness. Right ventricular systolic function is normal. There is mildly elevated pulmonary artery systolic pressure. The tricuspid regurgitant velocity is 2.73  m/s, and with an assumed right atrial pressure of 8 mmHg, the estimated right ventricular systolic pressure is 56.3 mmHg. Left Atrium: Left atrial size was normal in size. Right Atrium: Right atrial size was normal in size. Pericardium: There is no evidence of pericardial effusion. Mitral Valve: The mitral valve is normal in structure. No evidence of mitral valve regurgitation. No evidence of mitral valve stenosis. Tricuspid Valve: The tricuspid valve is normal in structure. Tricuspid valve regurgitation is mild . No evidence of tricuspid stenosis. Aortic Valve: The aortic valve is normal in structure. Aortic valve regurgitation is not visualized. No aortic stenosis is present. Pulmonic Valve: The pulmonic valve was normal in structure. Pulmonic valve regurgitation is not visualized. No evidence of pulmonic stenosis. Aorta: The aortic root  is normal in size and structure. Venous: The inferior vena cava is dilated in size with greater than 50% respiratory variability, suggesting right atrial pressure of 8 mmHg. IAS/Shunts: No atrial level shunt detected by color flow Doppler.  LEFT VENTRICLE PLAX 2D LVIDd:         4.10 cm  Diastology  LVIDs:         2.90 cm  LV e' medial:    5.00 cm/s LV PW:         1.10 cm  LV E/e' medial:  10.9 LV IVS:        0.70 cm  LV e' lateral:   7.72 cm/s LVOT diam:     2.30 cm  LV E/e' lateral: 7.1 LV SV:         53 LV SV Index:   37 LVOT Area:     4.15 cm  RIGHT VENTRICLE             IVC RV Basal diam:  2.60 cm     IVC diam: 2.10 cm RV S prime:     15.80 cm/s TAPSE (M-mode): 1.8 cm LEFT ATRIUM             Index       RIGHT ATRIUM          Index LA diam:        2.90 cm 2.06 cm/m  RA Area:     6.30 cm LA Vol (A2C):   29.8 ml 21.19 ml/m RA Volume:   8.79 ml  6.25 ml/m LA Vol (A4C):   35.1 ml 24.96 ml/m LA Biplane Vol: 33.4 ml 23.75 ml/m  AORTIC VALVE LVOT Vmax:   86.00 cm/s LVOT Vmean:  54.200 cm/s LVOT VTI:    0.126 m  AORTA Ao Root diam: 2.80 cm MITRAL VALVE               TRICUSPID VALVE MV Area (PHT): 4.68 cm    TR Peak grad:   29.8 mmHg MV Decel Time: 162 msec    TR Vmax:        273.00 cm/s MV E velocity: 54.50 cm/s MV A velocity: 72.80 cm/s  SHUNTS MV E/A ratio:  0.75        Systemic VTI:  0.13 m                            Systemic Diam: 2.30 cm Fransico Him MD Electronically signed by Fransico Him MD Signature Date/Time: 11/28/2020/11:05:35 AM    Final        LOS: 1 day   [facility] Pager on [email or web address]  11/29/2020, 11:31 AM

## 2020-11-30 ENCOUNTER — Encounter (HOSPITAL_COMMUNITY): Payer: Self-pay | Admitting: Internal Medicine

## 2020-11-30 LAB — BASIC METABOLIC PANEL
Anion gap: 6 (ref 5–15)
BUN: 9 mg/dL (ref 8–23)
CO2: 28 mmol/L (ref 22–32)
Calcium: 8.6 mg/dL — ABNORMAL LOW (ref 8.9–10.3)
Chloride: 95 mmol/L — ABNORMAL LOW (ref 98–111)
Creatinine, Ser: <0.3 mg/dL — ABNORMAL LOW (ref 0.44–1.00)
Glucose, Bld: 95 mg/dL (ref 70–99)
Potassium: 3.9 mmol/L (ref 3.5–5.1)
Sodium: 129 mmol/L — ABNORMAL LOW (ref 135–145)

## 2020-11-30 LAB — CBC
HCT: 33.4 % — ABNORMAL LOW (ref 36.0–46.0)
Hemoglobin: 11 g/dL — ABNORMAL LOW (ref 12.0–15.0)
MCH: 29.4 pg (ref 26.0–34.0)
MCHC: 32.9 g/dL (ref 30.0–36.0)
MCV: 89.3 fL (ref 80.0–100.0)
Platelets: 216 10*3/uL (ref 150–400)
RBC: 3.74 MIL/uL — ABNORMAL LOW (ref 3.87–5.11)
RDW: 13.7 % (ref 11.5–15.5)
WBC: 7.9 10*3/uL (ref 4.0–10.5)
nRBC: 0 % (ref 0.0–0.2)

## 2020-11-30 MED ORDER — CEFDINIR 300 MG PO CAPS
300.0000 mg | ORAL_CAPSULE | Freq: Two times a day (BID) | ORAL | Status: DC
  Administered 2020-11-30 – 2020-12-01 (×2): 300 mg via ORAL
  Filled 2020-11-30 (×2): qty 1

## 2020-11-30 MED ORDER — AZITHROMYCIN 250 MG PO TABS
500.0000 mg | ORAL_TABLET | Freq: Every day | ORAL | Status: DC
  Administered 2020-11-30: 500 mg via ORAL
  Filled 2020-11-30: qty 2

## 2020-11-30 MED ORDER — POLYETHYLENE GLYCOL 3350 17 G PO PACK
17.0000 g | PACK | Freq: Every day | ORAL | Status: DC
  Administered 2020-11-30: 17 g via ORAL
  Filled 2020-11-30 (×2): qty 1

## 2020-11-30 NOTE — Plan of Care (Signed)
°  Problem: Clinical Measurements: °Goal: Will remain free from infection °Outcome: Progressing °Goal: Respiratory complications will improve °Outcome: Progressing °  °

## 2020-11-30 NOTE — Progress Notes (Signed)
[facility] PROGRESS NOTE   Desa Rech WNI:[id number] DOB: 1950-06-20 DOA: 11/27/2020  PCP: Sharion Balloon, FNP  Brief History/Interval Summary: 70 y.o. female, with history of anxiety, breast cancer, cardiomyopathy, GERD, hyperlipidemia, hypertension, COPD, who presented to the ER with a chief complaint of dyspnea.  Symptoms have been ongoing for several days.  She apparently was seen by an outpatient provider and was started on antibiotics earlier this month but did not take it because it made her nauseated.  Noted to be hypoxic in the emergency department.  Chest x-ray showed left lower lobe infiltrate.  She was hospitalized for further management.      Consultants: None  Procedures: None  Antibiotics: Anti-infectives (From admission, onward)    Start     Dose/Rate Route Frequency Ordered Stop   11/30/20 2200  azithromycin (ZITHROMAX) tablet 500 mg        500 mg Oral Daily at bedtime 11/30/20 0855 12/02/20 2159   11/28/20 2359  azithromycin (ZITHROMAX) 500 mg in sodium chloride 0.9 % 250 mL IVPB  Status:  Discontinued        500 mg 250 mL/hr over 60 Minutes Intravenous Every 24 hours 11/28/20 0326 11/30/20 0855   11/28/20 2200  cefTRIAXone (ROCEPHIN) 2 g in sodium chloride 0.9 % 100 mL IVPB        2 g 200 mL/hr over 30 Minutes Intravenous Every 24 hours 11/28/20 0326 12/03/20 2159   11/27/20 2230  cefTRIAXone (ROCEPHIN) 1 g in sodium chloride 0.9 % 100 mL IVPB        1 g 200 mL/hr over 30 Minutes Intravenous  Once 11/27/20 2220 11/28/20 0017   11/27/20 2230  azithromycin (ZITHROMAX) 500 mg in sodium chloride 0.9 % 250 mL IVPB        500 mg 250 mL/hr over 60 Minutes Intravenous  Once 11/27/20 2220 11/28/20 0259       Subjective/Interval History: Patient continues to have a dry cough.  However she mentions that her shortness of breath has improved.  Denies any nausea vomiting.  Tolerating her diet.  She did ambulate.     Assessment/Plan:  Community-acquired  pneumonia/acute respiratory failure with hypoxia Saturations on room air were noted to be 87%.  Patient was found to have left-sided pneumonia. Started on ceftriaxone and azithromycin. Patient seems to be improving.  Saturating 98 to 99% on 2 L.  Should be able to wean her down.  Hopefully get her off of the oxygen. Continue to mobilize.  Continue expectorants. Continue flutter valve. Changed to oral antibiotics.  Steroids for 3 more days.   Check ambulatory pulse oximetry.  Hyponatremia Most likely due to hypovolemia.  There could also be a component of SIADH.   Sodium level has improved to 129 with hydration.  Urine osmolality noted to be 378.   Sodium level is stable.  Old labs reviewed and she is always had a degree of hyponatremia.  Remains asymptomatic.  Okay to stop IV fluids.  Recheck labs tomorrow.  Hypomagnesemia This was repleted.  Improved to 1.9.  History of cardiomyopathy Noted to have low EF of 30 to 35% in 2020.  Based on echocardiogram done in September 2021 her EF had recovered. Noted to be on carvedilol and Entresto which is being continued.   Echocardiogram was done during this hospitalization.  Shows EF to be 60 to 65% without any regional wall motion abnormalities.  Grade 1 diastolic dysfunction was noted.  No other significant abnormalities appreciated. Cardiac status is  stable.  It looks like she has followed with Dr. Domenic Polite previously.  Hyperlipidemia Continue statin.  Essential hypertension Continue home medications.  Blood pressures are reasonably well controlled.  Normocytic anemia Drop in hemoglobin is dilutional.  No evidence of overt blood loss.    DVT Prophylaxis: Lovenox Code Status: Full code Family Communication: Discussed with the patient.  No family at bedside Disposition Plan: Change to oral antibiotics today.  Anticipate discharge in the next 24 to 48 hours.  Status is: Inpatient  Remains inpatient appropriate because:IV treatments  appropriate due to intensity of illness or inability to take PO and Inpatient level of care appropriate due to severity of illness  Dispo: The patient is from: Home              Anticipated d/c is to: Home              Patient currently is not medically stable to d/c.   Difficult to place patient No        Medications: Scheduled:  aspirin EC  81 mg Oral Daily   atorvastatin  20 mg Oral Daily   azithromycin  500 mg Oral QHS   carvedilol  12.5 mg Oral BID WC   Chlorhexidine Gluconate Cloth  6 each Topical Daily   dextromethorphan-guaiFENesin  1 tablet Oral BID   enoxaparin (LOVENOX) injection  30 mg Subcutaneous Q24H   fesoterodine  4 mg Oral Daily   ipratropium-albuterol  3 mL Nebulization TID   mometasone-formoterol  2 puff Inhalation BID   pantoprazole  40 mg Oral Daily   polyethylene glycol  17 g Oral Daily   predniSONE  40 mg Oral Q breakfast   sacubitril-valsartan  1 tablet Oral BID   Continuous:  cefTRIAXone (ROCEPHIN)  IV 2 g (11/29/20 2210)   UKG:URKYHCWCBJSEG **OR** acetaminophen, albuterol, guaiFENesin, ondansetron **OR** ondansetron (ZOFRAN) IV   Objective:  Vital Signs  Vitals:   11/29/20 1941 11/29/20 1955 11/30/20 0536 11/30/20 0705  BP: 140/66 135/79 (!) 137/93   Pulse: 99 94 83   Resp:  16 18   Temp:  (!) 97.3 F (36.3 C) (!) 97.5 F (36.4 C)   TempSrc:  Oral Oral   SpO2:  100% 99% 99%  Weight:      Height:        Intake/Output Summary (Last 24 hours) at 11/30/2020 0947 Last data filed at 11/30/2020 0853 Gross per 24 hour  Intake 118 ml  Output --  Net 118 ml    Filed Weights   11/27/20 1944  Weight: 41.7 kg    General appearance: Awake alert.  In no distress Resp: Proved effort.  No use of accessory muscles.  Improved aeration.  Continues to have a few crackles on the left.  No wheezing or rhonchi. Cardio: S1-S2 is normal regular.  No S3-S4.  No rubs murmurs or bruit GI: Abdomen is soft.  Nontender nondistended.  Bowel sounds are  present normal.  No masses organomegaly Extremities: No edema.  Full range of motion of lower extremities. Neurologic: Alert and oriented x3.  No focal neurological deficits.      Lab Results:  Data Reviewed: I have personally reviewed following labs and imaging studies  CBC: Recent Labs  Lab 11/27/20 2241 11/28/20 0725 11/29/20 0522 11/30/20 0524  WBC 7.8 10.6* 5.6 7.9  NEUTROABS 6.3  --   --   --   HGB 12.0 12.1 10.3* 11.0*  HCT 35.8* 36.7 32.2* 33.4*  MCV 86.1 87.8 90.2 89.3  PLT 242 224 220 216     Basic Metabolic Panel: Recent Labs  Lab 11/27/20 2241 11/28/20 0725 11/29/20 0522 11/30/20 0524  NA 126* 125* 129* 129*  K 3.8 3.7 4.4 3.9  CL 83* 86* 96* 95*  CO2 30 27 26 28   GLUCOSE 107* 112* 151* 95  BUN 9 5* 7* 9  CREATININE <0.30* <0.30* <0.30* <0.30*  CALCIUM 9.8 9.3 8.9 8.6*  MG  --  1.5* 1.9  --      GFR: CrCl cannot be calculated (This lab value cannot be used to calculate CrCl because it is not a number: <0.30).  Liver Function Tests: Recent Labs  Lab 11/27/20 2241 11/28/20 0725  AST 19 18  ALT 18 16  ALKPHOS 71 61  BILITOT 0.7 0.3  PROT 8.4* 7.5  ALBUMIN 4.0 3.6       Recent Results (from the past 240 hour(s))  Resp Panel by RT-PCR (Flu A&B, Covid) Nasopharyngeal Swab     Status: None   Collection Time: 11/27/20 10:43 PM   Specimen: Nasopharyngeal Swab; Nasopharyngeal(NP) swabs in vial transport medium  Result Value Ref Range Status   SARS Coronavirus 2 by RT PCR NEGATIVE NEGATIVE Final    Comment: (NOTE) SARS-CoV-2 target nucleic acids are NOT DETECTED.  The SARS-CoV-2 RNA is generally detectable in upper respiratory specimens during the acute phase of infection. The lowest concentration of SARS-CoV-2 viral copies this assay can detect is 138 copies/mL. A negative result does not preclude SARS-Cov-2 infection and should not be used as the sole basis for treatment or other patient management decisions. A negative result may  occur with  improper specimen collection/handling, submission of specimen other than nasopharyngeal swab, presence of viral mutation(s) within the areas targeted by this assay, and inadequate number of viral copies(<138 copies/mL). A negative result must be combined with clinical observations, patient history, and epidemiological information. The expected result is Negative.  Fact Sheet for Patients:  EntrepreneurPulse.com.au  Fact Sheet for Healthcare Providers:  IncredibleEmployment.be  This test is no t yet approved or cleared by the [place] FDA and  has been authorized for detection and/or diagnosis of SARS-CoV-2 by FDA under an Emergency Use Authorization (EUA). This EUA will remain  in effect (meaning this test can be used) for the duration of the COVID-19 declaration under Section 564(b)(1) of the Act, 21 U.S.C.section 360bbb-3(b)(1), unless the authorization is terminated  or revoked sooner.       Influenza A by PCR NEGATIVE NEGATIVE Final   Influenza B by PCR NEGATIVE NEGATIVE Final    Comment: (NOTE) The Xpert Xpress SARS-CoV-2/FLU/RSV plus assay is intended as an aid in the diagnosis of influenza from Nasopharyngeal swab specimens and should not be used as a sole basis for treatment. Nasal washings and aspirates are unacceptable for Xpert Xpress SARS-CoV-2/FLU/RSV testing.  Fact Sheet for Patients: EntrepreneurPulse.com.au  Fact Sheet for Healthcare Providers: IncredibleEmployment.be  This test is not yet approved or cleared by the [place] FDA and has been authorized for detection and/or diagnosis of SARS-CoV-2 by FDA under an Emergency Use Authorization (EUA). This EUA will remain in effect (meaning this test can be used) for the duration of the COVID-19 declaration under Section 564(b)(1) of the Act, 21 U.S.C. section 360bbb-3(b)(1), unless the authorization is terminated  or revoked.  Performed at [facility], [place] [address], [place], [place] [address]        Radiology Studies: ECHOCARDIOGRAM COMPLETE  Result Date: 11/28/2020    ECHOCARDIOGRAM  REPORT   Patient Name:   Sherry Bender Date of Exam: 11/28/2020 Medical Rec #:  [id number]     Height:       64.0 in Accession #:    [id number]    Weight:       92.0 lb Date of Birth:  03-29-1951     BSA:          1.406 m Patient Age:    59 years      BP:           120/79 mmHg Patient Gender: F             HR:           103 bpm. Exam Location:  Inpatient Procedure: 2D Echo, Cardiac Doppler, Color Doppler and Intracardiac            Opacification Agent Indications:    Dyspnea R06.00  History:        Patient has prior history of Echocardiogram examinations, most                 recent 02/27/2020. Cardiomyopathy; Risk Factors:Hypertension and                 Dyslipidemia. Cancer. GERD.  Sonographer:    Tiffany Dance Referring Phys: [id number] ASIA B Story  1. Left ventricular ejection fraction, by estimation, is 60 to 65%. The left ventricle has normal function. The left ventricle has no regional wall motion abnormalities. Left ventricular diastolic parameters are consistent with Grade I diastolic dysfunction (impaired relaxation).  2. Right ventricular systolic function is normal. The right ventricular size is normal. There is mildly elevated pulmonary artery systolic pressure. The estimated right ventricular systolic pressure is 50.9 mmHg.  3. The mitral valve is normal in structure. No evidence of mitral valve regurgitation. No evidence of mitral stenosis.  4. The aortic valve is normal in structure. Aortic valve regurgitation is not visualized. No aortic stenosis is present.  5. The inferior vena cava is dilated in size with >50% respiratory variability, suggesting right atrial pressure of 8 mmHg. FINDINGS  Left Ventricle: Left ventricular ejection fraction, by estimation, is 60 to 65%. The left  ventricle has normal function. The left ventricle has no regional wall motion abnormalities. Definity contrast agent was given IV to delineate the left ventricular  endocardial borders. The left ventricular internal cavity size was normal in size. There is no left ventricular hypertrophy. Left ventricular diastolic parameters are consistent with Grade I diastolic dysfunction (impaired relaxation). Normal left ventricular filling pressure. Right Ventricle: The right ventricular size is normal. No increase in right ventricular wall thickness. Right ventricular systolic function is normal. There is mildly elevated pulmonary artery systolic pressure. The tricuspid regurgitant velocity is 2.73  m/s, and with an assumed right atrial pressure of 8 mmHg, the estimated right ventricular systolic pressure is 32.6 mmHg. Left Atrium: Left atrial size was normal in size. Right Atrium: Right atrial size was normal in size. Pericardium: There is no evidence of pericardial effusion. Mitral Valve: The mitral valve is normal in structure. No evidence of mitral valve regurgitation. No evidence of mitral valve stenosis. Tricuspid Valve: The tricuspid valve is normal in structure. Tricuspid valve regurgitation is mild . No evidence of tricuspid stenosis. Aortic Valve: The aortic valve is normal in structure. Aortic valve regurgitation is not visualized. No aortic stenosis is present. Pulmonic Valve: The pulmonic valve was normal in structure. Pulmonic valve regurgitation is not visualized. No evidence of pulmonic stenosis.  Aorta: The aortic root is normal in size and structure. Venous: The inferior vena cava is dilated in size with greater than 50% respiratory variability, suggesting right atrial pressure of 8 mmHg. IAS/Shunts: No atrial level shunt detected by color flow Doppler.  LEFT VENTRICLE PLAX 2D LVIDd:         4.10 cm  Diastology LVIDs:         2.90 cm  LV e' medial:    5.00 cm/s LV PW:         1.10 cm  LV E/e' medial:  10.9 LV  IVS:        0.70 cm  LV e' lateral:   7.72 cm/s LVOT diam:     2.30 cm  LV E/e' lateral: 7.1 LV SV:         53 LV SV Index:   37 LVOT Area:     4.15 cm  RIGHT VENTRICLE             IVC RV Basal diam:  2.60 cm     IVC diam: 2.10 cm RV S prime:     15.80 cm/s TAPSE (M-mode): 1.8 cm LEFT ATRIUM             Index       RIGHT ATRIUM          Index LA diam:        2.90 cm 2.06 cm/m  RA Area:     6.30 cm LA Vol (A2C):   29.8 ml 21.19 ml/m RA Volume:   8.79 ml  6.25 ml/m LA Vol (A4C):   35.1 ml 24.96 ml/m LA Biplane Vol: 33.4 ml 23.75 ml/m  AORTIC VALVE LVOT Vmax:   86.00 cm/s LVOT Vmean:  54.200 cm/s LVOT VTI:    0.126 m  AORTA Ao Root diam: 2.80 cm MITRAL VALVE               TRICUSPID VALVE MV Area (PHT): 4.68 cm    TR Peak grad:   29.8 mmHg MV Decel Time: 162 msec    TR Vmax:        273.00 cm/s MV E velocity: 54.50 cm/s MV A velocity: 72.80 cm/s  SHUNTS MV E/A ratio:  0.75        Systemic VTI:  0.13 m                            Systemic Diam: 2.30 cm Fransico Him MD Electronically signed by Fransico Him MD Signature Date/Time: 11/28/2020/11:05:35 AM    Final        LOS: 2 days   [facility] Pager on [email or web address]  11/30/2020, 9:47 AM

## 2020-12-01 MED ORDER — CEFDINIR 300 MG PO CAPS: 300.0000 mg | ORAL_CAPSULE | Freq: Two times a day (BID) | ORAL | 0 refills | Status: AC

## 2020-12-01 MED ORDER — AZITHROMYCIN 500 MG PO TABS: 500.0000 mg | ORAL_TABLET | Freq: Every day | ORAL | 0 refills | Status: AC

## 2020-12-01 MED ORDER — PREDNISONE 20 MG PO TABS: 40.0000 mg | ORAL_TABLET | Freq: Every day | ORAL | 0 refills | Status: AC

## 2020-12-01 MED ORDER — IPRATROPIUM-ALBUTEROL 0.5-2.5 (3) MG/3ML IN SOLN: 3.0000 mL | Freq: Two times a day (BID) | RESPIRATORY_TRACT | Status: DC

## 2020-12-01 NOTE — Evaluation (Signed)
Physical Therapy Evaluation Patient Details Name: Sherry Bender MRN: [id number] DOB: 10-May-1951 Today's Date: 12/01/2020   History of Present Illness  Patient is 70 y.o. female presented to ED on 11/28/20 with c/o dyspnea; pt admitted for CAP/ACRF with hypoxia. 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    Clinical Impression  Kamyra Schroeck is 70 y.o. female admitted with above HPI and diagnosis. Patient is currently limited by functional impairments below (see PT problem list). Patient lives with her husband and is independent with mobility but requires assist for ADL's at baseline. Patient requires min guard/assist for transfers and gait and pt's husband is able to help as needed. Patient will benefit from continued skilled PT interventions to address impairments and progress independence with mobility, recommending HH PT/OT follow up to maximize services and regain functional independence. Acute PT will follow and progress as able.     Follow Up Recommendations Home health PT;Other (comment) [facility] PT/OT)    Equipment Recommendations  None recommended by PT    Recommendations for Other Services       Precautions / Restrictions Precautions Precautions: Fall Restrictions Weight Bearing Restrictions: No Other Position/Activity Restrictions: Lt UE lymphedema and impaired motor function since 1/22      Mobility  Bed Mobility Overal bed mobility: Modified Independent             General bed mobility comments: pt sitting part way OOB and able to bring LE's off/on bed with extra time.    Transfers Overall transfer level: Needs assistance Equipment used: None Transfers: Sit to/from Stand Sit to Stand: Min guard         General transfer comment: guarding for safety  and 1HHA on Rt UE once risen to steady. cues for pt to use Rt UE for power up.  Ambulation/Gait Ambulation/Gait assistance: Min assist;Min guard Gait Distance (Feet): 150 Feet Assistive device: 1 person hand held assist;None Gait Pattern/deviations: Step-through pattern;Decreased step length - right;Decreased step length - left;Decreased stride length;Trunk flexed;Drifts right/left Gait velocity: decr      Stairs            Wheelchair Mobility    Modified Rankin (Stroke Patients Only)       Balance Overall balance assessment: Needs assistance Sitting-balance support: Feet supported Sitting balance-Leahy Scale: Good     Standing balance support: During functional activity;Single extremity supported;No upper extremity supported Standing balance-Leahy Scale: Good                               Pertinent Vitals/Pain Pain Assessment: No/denies pain    Home Living Family/patient expects to be discharged to:: Private residence Living Arrangements: Spouse/significant other Available Help at Discharge: Family Type of Home: House Home Access: Stairs to enter Entrance Stairs-Rails: Can reach both Entrance Stairs-Number of Steps: 2 + 1 Home Layout: One level Home Equipment: Other (comment);Cane - quad;Adaptive equipment;Hand held shower head      Prior Function Level of Independence: Needs assistance   Gait / Transfers Assistance Needed: Patient states household ambulator without AD  ADL's / Homemaking Assistance Needed: husband assists with bathing and dressing and all homemaking.  Comments: Pt reported that husband helps as needed with ADL tasks.     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 2022.    Lower Extremity  Assessment Lower Extremity Assessment: Generalized weakness    Cervical / Trunk Assessment Cervical / Trunk Assessment: Kyphotic;Other exceptions Cervical / Trunk Exceptions: scoliosis  Communication   Communication: No difficulties  Cognition Arousal/Alertness: Awake/alert Behavior During Therapy: WFL for tasks assessed/performed Overall Cognitive Status: Within Functional Limits for tasks assessed                                 General Comments: pt A&Ox4 and pt's husband present reporting pt seems to be at baseline.      General Comments      Exercises     Assessment/Plan    PT Assessment Patient needs continued PT services  PT Problem List Decreased strength;Decreased activity tolerance;Decreased balance;Decreased mobility;Decreased knowledge of use of DME;Decreased safety awareness       PT Treatment Interventions DME instruction;Gait training;Stair training;Functional mobility training;Therapeutic activities;Therapeutic exercise;Balance training;Neuromuscular re-education;Cognitive remediation;Patient/family education    PT Goals (Current goals can be found in the Care Plan section)  Acute Rehab PT Goals Patient Stated Goal: get back strength for moving around home and follow up with Neuro for Lt shoulder/UE impairments. PT Goal Formulation: With patient/family Time For Goal Achievement: 12/08/20 Potential to Achieve Goals: Good    Frequency Min 3X/week   Barriers to discharge        Co-evaluation               AM-PAC PT "6 Clicks" Mobility  Outcome Measure Help needed turning from your back to your side while in a flat bed without using bedrails?: None Help needed moving from lying on your back to sitting on the side of a flat bed without using bedrails?: A Little Help needed moving to and from a bed to a chair (including a wheelchair)?: A Little Help needed standing up from a chair using your arms (e.g., wheelchair or bedside chair)?: A  Little Help needed to walk in hospital room?: A Little Help needed climbing 3-5 steps with a railing? : A Little 6 Click Score: 19    End of Session Equipment Utilized During Treatment: Gait belt Activity Tolerance: Patient tolerated treatment well Patient left: in bed;with call bell/phone within reach;with family/visitor present Nurse Communication: Mobility status PT Visit Diagnosis: Unsteadiness on feet (R26.81);Muscle weakness (generalized) (M62.81)    Time: 9373-4287 PT Time Calculation (min) (ACUTE ONLY): 26 min   Charges:   PT Evaluation $PT Eval Low Complexity: 1 Low PT Treatments $Gait Training: 8-22 mins        Verner Mould, DPT Acute Rehabilitation Services Office [phone] Pager [phone]   Jacques Navy 12/01/2020, 1:08 PM

## 2020-12-01 NOTE — TOC Transition Note (Signed)
Transition of Care [facility]) - CM/SW Discharge Note   Patient Details  Name: Sherry Bender MRN: [id number] Date of Birth: 13-Mar-1951  Transition of Care [facility]) CM/SW Contact:  Illene Regulus, LCSW Phone Number: 12/01/2020, 2:30 PM   Clinical Narrative:    Post D/C CSW received confirmation from [facility] stating they made contact with the patient.The agency is still in negation as the patient is not ready to discuss arrangements until tomorrow.          Patient Goals and CMS Choice        Discharge Placement                       Discharge Plan and Services                                     Social Determinants of Health (SDOH) Interventions     Readmission Risk Interventions No flowsheet data found.

## 2020-12-01 NOTE — Discharge Summary (Signed)
Physician Discharge Summary  Sherry Bender JME:[id number] DOB: March 29, 1951 DOA: 11/27/2020  PCP: Sharion Balloon, FNP  Admit date: 11/27/2020 Discharge date: 12/01/2020  Admitted From: Home Disposition:  Home  Discharge Condition:Stable CODE STATUS:FULL, Diet recommendation: Heart Healthy   Brief/Interim Summary:  70 y.o. female, with history of anxiety, breast cancer, cardiomyopathy, GERD, hyperlipidemia, hypertension, COPD, who presented to the ER with a chief complaint of dyspnea.  Symptoms have been ongoing for several days.  She apparently was seen by an outpatient provider and was started on antibiotics earlier this month but did not take it because it made her nauseated.  Noted to be hypoxic in the emergency department.  Chest x-ray showed left lower lobe infiltrate.  She was hospitalized for further management.   She was started on antibiotics for community-acquired pneumonia.  Currently her respiratory status is stable.  She does not complain of any shortness of breath or cough.  She is maintaining her saturation on room air.  She is medically stable for discharge home today with oral antibiotics.  Following problems were addressed during her hospitalization:  Community-acquired pneumonia/acute respiratory failure with hypoxia Saturations on room air were noted to be 87%.  Patient was found to have left-sided pneumonia. Started on ceftriaxone and azithromycin. Patient seems to be improving. Her saturation is currently well-maintained on room air. Changed to oral antibiotics.  Also on oral steroids.    Hyponatremia Most likely due to hypovolemia.  There could also be a component of SIADH.   Sodium level has improved to 129 with hydration.  Urine osmolality noted to be 378.   Sodium level is stable.  Old labs reviewed and she is always had a degree of hyponatremia.  Remains asymptomatic.  Check BMP in a week  Hypomagnesemia This was repleted.  Improved to 1.9.   History of  cardiomyopathy Noted to have low EF of 30 to 35% in 2020.  Based on echocardiogram done in September 2021 her EF had recovered. Noted to be on carvedilol and Entresto which is being continued.   Echocardiogram was done during this hospitalization.  Shows EF to be 60 to 65% without any regional wall motion abnormalities.  Grade 1 diastolic dysfunction was noted.  No other significant abnormalities appreciated. Cardiac status is stable.  It looks like she has followed with Dr. Domenic Polite previously.  We recommend to follow-up with her cardiologist as an outpatient.   Hyperlipidemia Continue statin.  Essential hypertension Continue home medications.  Blood pressures are reasonably well controlled.   Normocytic anemia Drop in hemoglobin is dilutional.  No evidence of overt blood loss.     Discharge Diagnoses:  Active Problems:   Acute respiratory failure with hypoxia (HCC)   Hyponatremia   Community acquired pneumonia   Lower extremity edema   CAP (community acquired pneumonia)    Discharge Instructions  Discharge Instructions     Diet - low sodium heart healthy   Complete by: As directed    Discharge instructions   Complete by: As directed    1)Take prescribed medications as instructed 2)Please follow up with your PCP in  a week   Increase activity slowly   Complete by: As directed       Allergies as of 12/01/2020       Reactions   Hydrocodone Other (See Comments)   Patient does not want again, Its makes her sick.   Codeine Nausea And Vomiting   Spironolactone    Significant weakness, cramping along lower extremities  Medication List     STOP taking these medications    amoxicillin-clavulanate 875-125 MG tablet Commonly known as: Augmentin       TAKE these medications    albuterol 108 (90 Base) MCG/ACT inhaler Commonly known as: VENTOLIN HFA Inhale 2 puffs into the lungs every 6 (six) hours as needed for wheezing or shortness of breath.    albuterol (2.5 MG/3ML) 0.083% nebulizer solution Commonly known as: PROVENTIL Take 3 mLs (2.5 mg total) by nebulization every 6 (six) hours as needed for wheezing or shortness of breath.   aspirin EC 81 MG tablet Take 1 tablet (81 mg total) by mouth daily. Swallow whole.   atorvastatin 20 MG tablet Commonly known as: LIPITOR TAKE 1 TABLET BY MOUTH EVERY DAY   azithromycin 500 MG tablet Commonly known as: ZITHROMAX Take 1 tablet (500 mg total) by mouth daily for 2 days.   carvedilol 3.125 MG tablet Commonly known as: COREG Take 0.5 tablets (1.55 mg total) by mouth in the morning and at bedtime.   cefdinir 300 MG capsule Commonly known as: OMNICEF Take 1 capsule (300 mg total) by mouth every 12 (twelve) hours for 3 days.   dextromethorphan-guaiFENesin 30-600 MG 12hr tablet Commonly known as: MUCINEX DM Take 1 tablet by mouth 2 (two) times daily.   diphenhydrAMINE 25 MG tablet Commonly known as: BENADRYL Take 25 mg by mouth at bedtime.   esomeprazole 40 MG capsule Commonly known as: NexIUM Take 1 capsule (40 mg total) by mouth daily.   ibuprofen 200 MG tablet Commonly known as: ADVIL Take 400 mg by mouth every 6 (six) hours as needed for moderate pain.   Lidocaine 4 % Ptch Apply 1 patch topically daily as needed (pain).   ondansetron 4 MG tablet Commonly known as: Zofran Take 1 tablet (4 mg total) by mouth every 8 (eight) hours as needed for nausea or vomiting.   predniSONE 20 MG tablet Commonly known as: DELTASONE Take 2 tablets (40 mg total) by mouth daily with breakfast for 1 day. Start taking on: December 02, 2020   sodium chloride 0.65 % Soln nasal spray Commonly known as: OCEAN Place 1 spray into both nostrils as needed for congestion.   Systane Balance 0.6 % Soln Generic drug: Propylene Glycol Place 1 drop into both eyes daily as needed (dry eyes).   tolterodine 2 MG 24 hr capsule Commonly known as: DETROL LA Take 1 capsule (2 mg total) by mouth daily.        ASK your doctor about these medications    Entresto 24-26 MG Generic drug: sacubitril-valsartan TAKE 1 TABLET BY MOUTH 2 (TWO) TIMES DAILY. DUE FOR FOLLOW UP APPT   estradiol 1 MG tablet Commonly known as: ESTRACE TAKE 1 TABLET BY MOUTH EVERY DAY   fluticasone-salmeterol 250-50 MCG/ACT Aepb Commonly known as: Advair Diskus Inhale 1 puff into the lungs in the morning and at bedtime.   furosemide 20 MG tablet Commonly known as: LASIX Take 1 tablet (20 mg total) by mouth daily.   triamcinolone ointment 0.5 % Commonly known as: KENALOG Apply 1 application topically 2 (two) times daily.        Follow-up Information     Sharion Balloon, FNP. Schedule an appointment as soon as possible for a visit in 1 week(s).   Specialty: Family Medicine Contact information: [place] [address] [phone]                Allergies  Allergen Reactions  Hydrocodone Other (See Comments)    Patient does not want again, Its makes her sick.   Codeine Nausea And Vomiting   Spironolactone     Significant weakness, cramping along lower extremities    Consultations: None   Procedures/Studies: DG Chest 2 View  Result Date: 11/12/2020 CLINICAL DATA:  Shortness of breath EXAM: CHEST - 2 VIEW COMPARISON:  12/20/2019 FINDINGS: Patient is rotated. Cardiomegaly. Atherosclerotic calcification of the aortic knob. Hyperexpanded lungs. No focal airspace consolidation, pleural effusion, or pneumothorax. Degenerative changes of the left shoulder and lower cervical spine IMPRESSION: 1. Cardiomegaly. 2. COPD. Electronically Signed   By: Davina Poke D.O.   On: 11/12/2020 14:41   DG Chest Port 1 View  Result Date: 11/27/2020 CLINICAL DATA:  Cough, shortness of breath EXAM: PORTABLE CHEST 1 VIEW COMPARISON:  11/19/2020 FINDINGS: Heart is normal size. Left lower lobe consolidation, worsening since prior study. Right lung clear. No visible effusions or acute bony  abnormality. IMPRESSION: Worsening left lower lobe airspace opacity could reflect worsening atelectasis or infiltrate. Electronically Signed   By: Rolm Baptise M.D.   On: 11/27/2020 22:12   ECHOCARDIOGRAM COMPLETE  Result Date: 11/28/2020    ECHOCARDIOGRAM REPORT   Patient Name:   Sherry Bender Date of Exam: 11/28/2020 Medical Rec #:  [id number]     Height:       64.0 in Accession #:    [id number]    Weight:       92.0 lb Date of Birth:  [birth year]     BSA:          1.406 m Patient Age:    57 years      BP:           120/79 mmHg Patient Gender: F             HR:           103 bpm. Exam Location:  Inpatient Procedure: 2D Echo, Cardiac Doppler, Color Doppler and Intracardiac            Opacification Agent Indications:    Dyspnea R06.00  History:        Patient has prior history of Echocardiogram examinations, most                 recent 02/27/2020. Cardiomyopathy; Risk Factors:Hypertension and                 Dyslipidemia. Cancer. GERD.  Sonographer:    Tiffany Dance Referring Phys: [id number] ASIA B [place]  1. Left ventricular ejection fraction, by estimation, is 60 to 65%. The left ventricle has normal function. The left ventricle has no regional wall motion abnormalities. Left ventricular diastolic parameters are consistent with Grade I diastolic dysfunction (impaired relaxation).  2. Right ventricular systolic function is normal. The right ventricular size is normal. There is mildly elevated pulmonary artery systolic pressure. The estimated right ventricular systolic pressure is 06.2 mmHg.  3. The mitral valve is normal in structure. No evidence of mitral valve regurgitation. No evidence of mitral stenosis.  4. The aortic valve is normal in structure. Aortic valve regurgitation is not visualized. No aortic stenosis is present.  5. The inferior vena cava is dilated in size with >50% respiratory variability, suggesting right atrial pressure of 8 mmHg. FINDINGS  Left Ventricle: Left ventricular  ejection fraction, by estimation, is 60 to 65%. The left ventricle has normal function. The left ventricle has no regional wall motion abnormalities. Definity contrast agent was given IV to  delineate the left ventricular  endocardial borders. The left ventricular internal cavity size was normal in size. There is no left ventricular hypertrophy. Left ventricular diastolic parameters are consistent with Grade I diastolic dysfunction (impaired relaxation). Normal left ventricular filling pressure. Right Ventricle: The right ventricular size is normal. No increase in right ventricular wall thickness. Right ventricular systolic function is normal. There is mildly elevated pulmonary artery systolic pressure. The tricuspid regurgitant velocity is 2.73  m/s, and with an assumed right atrial pressure of 8 mmHg, the estimated right ventricular systolic pressure is 99.3 mmHg. Left Atrium: Left atrial size was normal in size. Right Atrium: Right atrial size was normal in size. Pericardium: There is no evidence of pericardial effusion. Mitral Valve: The mitral valve is normal in structure. No evidence of mitral valve regurgitation. No evidence of mitral valve stenosis. Tricuspid Valve: The tricuspid valve is normal in structure. Tricuspid valve regurgitation is mild . No evidence of tricuspid stenosis. Aortic Valve: The aortic valve is normal in structure. Aortic valve regurgitation is not visualized. No aortic stenosis is present. Pulmonic Valve: The pulmonic valve was normal in structure. Pulmonic valve regurgitation is not visualized. No evidence of pulmonic stenosis. Aorta: The aortic root is normal in size and structure. Venous: The inferior vena cava is dilated in size with greater than 50% respiratory variability, suggesting right atrial pressure of 8 mmHg. IAS/Shunts: No atrial level shunt detected by color flow Doppler.  LEFT VENTRICLE PLAX 2D LVIDd:         4.10 cm  Diastology LVIDs:         2.90 cm  LV e' medial:     5.00 cm/s LV PW:         1.10 cm  LV E/e' medial:  10.9 LV IVS:        0.70 cm  LV e' lateral:   7.72 cm/s LVOT diam:     2.30 cm  LV E/e' lateral: 7.1 LV SV:         53 LV SV Index:   37 LVOT Area:     4.15 cm  RIGHT VENTRICLE             IVC RV Basal diam:  2.60 cm     IVC diam: 2.10 cm RV S prime:     15.80 cm/s TAPSE (M-mode): 1.8 cm LEFT ATRIUM             Index       RIGHT ATRIUM          Index LA diam:        2.90 cm 2.06 cm/m  RA Area:     6.30 cm LA Vol (A2C):   29.8 ml 21.19 ml/m RA Volume:   8.79 ml  6.25 ml/m LA Vol (A4C):   35.1 ml 24.96 ml/m LA Biplane Vol: 33.4 ml 23.75 ml/m  AORTIC VALVE LVOT Vmax:   86.00 cm/s LVOT Vmean:  54.200 cm/s LVOT VTI:    0.126 m  AORTA Ao Root diam: 2.80 cm MITRAL VALVE               TRICUSPID VALVE MV Area (PHT): 4.68 cm    TR Peak grad:   29.8 mmHg MV Decel Time: 162 msec    TR Vmax:        273.00 cm/s MV E velocity: 54.50 cm/s MV A velocity: 72.80 cm/s  SHUNTS MV E/A ratio:  0.75        Systemic VTI:  0.13  m                            Systemic Diam: 2.30 cm Fransico Him MD Electronically signed by Fransico Him MD Signature Date/Time: 11/28/2020/11:05:35 AM    Final       Subjective: Patient seen and examined the bedside this morning.  Medically stable for discharge.  I called  the husband and discussed about the discharge planning  Discharge Exam: Vitals:   12/01/20 0452 12/01/20 0728  BP: 121/73   Pulse: 87   Resp: 18   Temp: 98.2 F (36.8 C)   SpO2: 96% 95%   Vitals:   11/30/20 1944 11/30/20 2025 12/01/20 0452 12/01/20 0728  BP: 131/82  121/73   Pulse: 94  87   Resp: 18  18   Temp: 98.6 F (37 C)  98.2 F (36.8 C)   TempSrc: Oral  Oral   SpO2: 97% 97% 96% 95%  Weight:      Height:        General: Pt is alert, awake, not in acute distress Cardiovascular: RRR, S1/S2 +, no rubs, no gallops Respiratory: CTA bilaterally, no wheezing, no rhonchi Abdominal: Soft, NT, ND, bowel sounds + Extremities: no edema, no cyanosis    The  results of significant diagnostics from this hospitalization (including imaging, microbiology, ancillary and laboratory) are listed below for reference.     Microbiology: Recent Results (from the past 240 hour(s))  Resp Panel by RT-PCR (Flu A&B, Covid) Nasopharyngeal Swab     Status: None   Collection Time: 11/27/20 10:43 PM   Specimen: Nasopharyngeal Swab; Nasopharyngeal(NP) swabs in vial transport medium  Result Value Ref Range Status   SARS Coronavirus 2 by RT PCR NEGATIVE NEGATIVE Final    Comment: (NOTE) SARS-CoV-2 target nucleic acids are NOT DETECTED.  The SARS-CoV-2 RNA is generally detectable in upper respiratory specimens during the acute phase of infection. The lowest concentration of SARS-CoV-2 viral copies this assay can detect is 138 copies/mL. A negative result does not preclude SARS-Cov-2 infection and should not be used as the sole basis for treatment or other patient management decisions. A negative result may occur with  improper specimen collection/handling, submission of specimen other than nasopharyngeal swab, presence of viral mutation(s) within the areas targeted by this assay, and inadequate number of viral copies(<138 copies/mL). A negative result must be combined with clinical observations, patient history, and epidemiological information. The expected result is Negative.  Fact Sheet for Patients:  EntrepreneurPulse.com.au  Fact Sheet for Healthcare Providers:  IncredibleEmployment.be  This test is no t yet approved or cleared by the [place] FDA and  has been authorized for detection and/or diagnosis of SARS-CoV-2 by FDA under an Emergency Use Authorization (EUA). This EUA will remain  in effect (meaning this test can be used) for the duration of the COVID-19 declaration under Section 564(b)(1) of the Act, 21 U.S.C.section 360bbb-3(b)(1), unless the authorization is terminated  or revoked sooner.        Influenza A by PCR NEGATIVE NEGATIVE Final   Influenza B by PCR NEGATIVE NEGATIVE Final    Comment: (NOTE) The Xpert Xpress SARS-CoV-2/FLU/RSV plus assay is intended as an aid in the diagnosis of influenza from Nasopharyngeal swab specimens and should not be used as a sole basis for treatment. Nasal washings and aspirates are unacceptable for Xpert Xpress SARS-CoV-2/FLU/RSV testing.  Fact Sheet for Patients: EntrepreneurPulse.com.au  Fact Sheet for Healthcare Providers: IncredibleEmployment.be  This test  is not yet approved or cleared by the [place] and has been authorized for detection and/or diagnosis of SARS-CoV-2 by FDA under an Emergency Use Authorization (EUA). This EUA will remain in effect (meaning this test can be used) for the duration of the COVID-19 declaration under Section 564(b)(1) of the Act, 21 U.S.C. section 360bbb-3(b)(1), unless the authorization is terminated or revoked.  Performed at [facility], [place] [address], [place],  [address]      Labs: BNP (last 3 results) Recent Labs    11/12/20 1532 11/27/20 2241  BNP 64.0 06.3   Basic Metabolic Panel: Recent Labs  Lab 11/27/20 2241 11/28/20 0725 11/29/20 0522 11/30/20 0524  NA 126* 125* 129* 129*  K 3.8 3.7 4.4 3.9  CL 83* 86* 96* 95*  CO2 30 27 26 28   GLUCOSE 107* 112* 151* 95  BUN 9 5* 7* 9  CREATININE <0.30* <0.30* <0.30* <0.30*  CALCIUM 9.8 9.3 8.9 8.6*  MG  --  1.5* 1.9  --    Liver Function Tests: Recent Labs  Lab 11/27/20 2241 11/28/20 0725  AST 19 18  ALT 18 16  ALKPHOS 71 61  BILITOT 0.7 0.3  PROT 8.4* 7.5  ALBUMIN 4.0 3.6   No results for input(s): LIPASE, AMYLASE in the last 168 hours. No results for input(s): AMMONIA in the last 168 hours. CBC: Recent Labs  Lab 11/27/20 2241 11/28/20 0725 11/29/20 0522 11/30/20 0524  WBC 7.8 10.6* 5.6 7.9  NEUTROABS 6.3  --   --   --   HGB 12.0 12.1 10.3* 11.0*   HCT 35.8* 36.7 32.2* 33.4*  MCV 86.1 87.8 90.2 89.3  PLT 242 224 220 216   Cardiac Enzymes: No results for input(s): CKTOTAL, CKMB, CKMBINDEX, TROPONINI in the last 168 hours. BNP: Invalid input(s): POCBNP CBG: No results for input(s): GLUCAP in the last 168 hours. D-Dimer No results for input(s): DDIMER in the last 72 hours. Hgb A1c No results for input(s): HGBA1C in the last 72 hours. Lipid Profile No results for input(s): CHOL, HDL, LDLCALC, TRIG, CHOLHDL, LDLDIRECT in the last 72 hours. Thyroid function studies No results for input(s): TSH, T4TOTAL, T3FREE, THYROIDAB in the last 72 hours.  Invalid input(s): FREET3 Anemia work up No results for input(s): VITAMINB12, FOLATE, FERRITIN, TIBC, IRON, RETICCTPCT in the last 72 hours. Urinalysis    Component Value Date/Time   COLORURINE YELLOW 03/16/2009 1403   APPEARANCEUR CLEAR 03/16/2009 1403   LABSPEC 1.005 12/07/2014 1302   PHURINE 7.5 12/07/2014 1302   PHURINE 6.0 03/16/2009 1403   GLUCOSEU Negative 12/07/2014 1302   HGBUR Negative 12/07/2014 1302   HGBUR NEGATIVE 03/16/2009 1403   BILIRUBINUR Negative 12/07/2014 1302   KETONESUR Negative 12/07/2014 1302   KETONESUR NEGATIVE 03/16/2009 1403   PROTEINUR Negative 12/07/2014 1302   PROTEINUR NEGATIVE 03/16/2009 1403   UROBILINOGEN 0.2 12/07/2014 1302   NITRITE Negative 12/07/2014 1302   NITRITE NEGATIVE 03/16/2009 1403   LEUKOCYTESUR Negative 12/07/2014 1302   Sepsis Labs Invalid input(s): PROCALCITONIN,  WBC,  LACTICIDVEN Microbiology Recent Results (from the past 240 hour(s))  Resp Panel by RT-PCR (Flu A&B, Covid) Nasopharyngeal Swab     Status: None   Collection Time: 11/27/20 10:43 PM   Specimen: Nasopharyngeal Swab; Nasopharyngeal(NP) swabs in vial transport medium  Result Value Ref Range Status   SARS Coronavirus 2 by RT PCR NEGATIVE NEGATIVE Final    Comment: (NOTE) SARS-CoV-2 target nucleic acids are NOT DETECTED.  The SARS-CoV-2 RNA is generally  detectable in upper respiratory specimens during the acute phase of infection. The lowest concentration of SARS-CoV-2 viral copies this assay can detect is 138 copies/mL. A negative result does not preclude SARS-Cov-2 infection and should not be used as the sole basis for treatment or other patient management decisions. A negative result may occur with  improper specimen collection/handling, submission of specimen other than nasopharyngeal swab, presence of viral mutation(s) within the areas targeted by this assay, and inadequate number of viral copies(<138 copies/mL). A negative result must be combined with clinical observations, patient history, and epidemiological information. The expected result is Negative.  Fact Sheet for Patients:  EntrepreneurPulse.com.au  Fact Sheet for Healthcare Providers:  IncredibleEmployment.be  This test is no t yet approved or cleared by the [place] FDA and  has been authorized for detection and/or diagnosis of SARS-CoV-2 by FDA under an Emergency Use Authorization (EUA). This EUA will remain  in effect (meaning this test can be used) for the duration of the COVID-19 declaration under Section 564(b)(1) of the Act, 21 U.S.C.section 360bbb-3(b)(1), unless the authorization is terminated  or revoked sooner.       Influenza A by PCR NEGATIVE NEGATIVE Final   Influenza B by PCR NEGATIVE NEGATIVE Final    Comment: (NOTE) The Xpert Xpress SARS-CoV-2/FLU/RSV plus assay is intended as an aid in the diagnosis of influenza from Nasopharyngeal swab specimens and should not be used as a sole basis for treatment. Nasal washings and aspirates are unacceptable for Xpert Xpress SARS-CoV-2/FLU/RSV testing.  Fact Sheet for Patients: EntrepreneurPulse.com.au  Fact Sheet for Healthcare Providers: IncredibleEmployment.be  This test is not yet approved or cleared by the [place] FDA  and has been authorized for detection and/or diagnosis of SARS-CoV-2 by FDA under an Emergency Use Authorization (EUA). This EUA will remain in effect (meaning this test can be used) for the duration of the COVID-19 declaration under Section 564(b)(1) of the Act, 21 U.S.C. section 360bbb-3(b)(1), unless the authorization is terminated or revoked.  Performed at [facility], [place] [address], [place], [place] [address]     Please note: You were cared for by a hospitalist during your hospital stay. Once you are discharged, your primary care physician will handle any further medical issues. Please note that NO REFILLS for any discharge medications will be authorized once you are discharged, as it is imperative that you return to your primary care physician (or establish a relationship with a primary care physician if you do not have one) for your post hospital discharge needs so that they can reassess your need for medications and monitor your lab values.    Time coordinating discharge: 40 minutes  SIGNED:   Shelly Coss, MD  [facility] 12/01/2020, 10:51 AM Pager [phone]  If 7PM-7AM, please contact night-coverage [email or web address] Password [email or web address]

## 2020-12-02 ENCOUNTER — Telehealth: Payer: Self-pay

## 2020-12-02 NOTE — Telephone Encounter (Signed)
Transition Care Management Follow-up Telephone Call Date of discharge and from where: 12/01/20 [facility] Diagnosis: Acute Respiratory Failure  with hypoxemia/ COPD exacerbation How have you been since you were released from the hospital? She is very weak and tired - she is home alone all day until her husband gets home. He is gone 6am-5pm daily - she feels like she needs someone to sit with her - she's too weak to cook, doesn't have the energy to do much Any questions or concerns? Yes already has HHOT from [facility] - she would like to see if she can also get HHN and Aide  Items Reviewed: Did the pt receive and understand the discharge instructions provided? Yes  Medications obtained and verified? Yes  Other? No  Any new allergies since your discharge? No  Dietary orders reviewed? Yes Do you have support at home? No   Home Care and Equipment/Supplies: Were home health services ordered? yes If so, what is the name of the agency? [facility]  Has the agency set up a time to come to the patient's home? Yes, OT only per patient Were any new equipment or medical supplies ordered?  No What is the name of the medical supply agency? N/a Were you able to get the supplies/equipment? not applicable Do you have any questions related to the use of the equipment or supplies? No  Functional Questionnaire: (I = Independent and D = Dependent) ADLs: D - she feels that she needs assistance with most things  Bathing/Dressing- I  Meal Prep- D  Eating- I  Maintaining continence- I  Transferring/Ambulation- I  Managing Meds- I  Follow up appointments reviewed:  PCP Hospital f/u appt confirmed? Yes  Scheduled to see Evelina Dun on 12/07/20 @ 2:10. [facility] f/u appt confirmed? No   Are transportation arrangements needed? Yes  she cannot drive, she doesn't feel comfortable taking [facility] - she wants HHN to come in and do evisits only with PCP If their condition worsens, is the pt aware to  call PCP or go to the Emergency Dept.? Yes Was the patient provided with contact information for the PCP's office or ED? Yes Was to pt encouraged to call back with questions or concerns? Yes

## 2020-12-07 ENCOUNTER — Telehealth: Payer: Self-pay | Admitting: Family

## 2020-12-07 ENCOUNTER — Ambulatory Visit: Payer: 59 | Admitting: Family

## 2020-12-08 ENCOUNTER — Encounter: Payer: Self-pay | Admitting: Family

## 2020-12-08 NOTE — Telephone Encounter (Signed)
Contacted patient with clinic lead Jamie Bullins present. Spoke with patient and clified whether or not she was capable of viso visit. Once determined, a virtual visit  with Jac Canavan, NP was made for 12/10/2020 at 11:15am

## 2020-12-10 ENCOUNTER — Encounter: Payer: Self-pay | Admitting: Nurse Practitioner

## 2020-12-10 ENCOUNTER — Telehealth (INDEPENDENT_AMBULATORY_CARE_PROVIDER_SITE_OTHER): Payer: 59 | Admitting: Nurse Practitioner

## 2020-12-10 DIAGNOSIS — Z09 Encounter for follow-up examination after completed treatment for conditions other than malignant neoplasm: Secondary | ICD-10-CM | POA: Insufficient documentation

## 2020-12-10 DIAGNOSIS — J189 Pneumonia, unspecified organism: Secondary | ICD-10-CM | POA: Diagnosis not present

## 2020-12-10 NOTE — Assessment & Plan Note (Signed)
Patient did not sound to be in distress or having worsening symptoms.  Hospital discharge instructions completed advised patient to follow-up in 6 weeks for repeat chest x-ray to rule out pneumonia.  Patient verbalized understanding and will call to make a schedule.

## 2020-12-10 NOTE — Progress Notes (Signed)
   Virtual Visit  Note Due to COVID-19 pandemic this visit was conducted virtually. This visit type was conducted due to national recommendations for restrictions regarding the COVID-19 Pandemic (e.g. social distancing, sheltering in place) in an effort to limit this patient's exposure and mitigate transmission in our community. All issues noted in this document were discussed and addressed.  A physical exam was not performed with this format.  I connected with Sherry Bender on 12/10/20 at 11:25 am by telephone and verified that I am speaking with the correct person using two identifiers. Sherry Bender is currently located at home during visit. The provider, Ivy Lynn, NP is located in their office at time of visit.  I discussed the limitations, risks, security and privacy concerns of performing an evaluation and management service by telephone and the availability of in person appointments. I also discussed with the patient that there may be a patient responsible charge related to this service. The patient expressed understanding and agreed to proceed.   History and Present Illness:  HPI  Patient is a 70 year old female seen via televisit for hospital discharge follow-up.  She has a history of anxiety, breast cancer, GERD, hyperlipidemia, hypertension and cardiomyopathy.  Patient was in the ER with dyspnea ongoing for several days after treatment of antibiotics outpatient without therapeutic effect.  Patient was hypoxic and chest x-ray in the emergency department showed left lower lobe infiltrate.  Patient was admitted and treated for pneumonia.  She is reporting no shortness of breath today, mild cough, no fever nausea or vomiting.  For hospital follow-up-completed discharge instruction with patient about a phone medication reconciliation and instructions provided patient verbalized understanding.  Review of Systems  Constitutional:  Negative for chills and fever.  HENT: Negative.     Respiratory: Negative.    Cardiovascular: Negative.   Musculoskeletal: Negative.   Skin:  Negative for rash.  All other systems reviewed and are negative.   Observations/Objective: Televisit patient did not sound to be in distress.  Assessment and Plan: Follow-up in 6 to 8 weeks with PCP repeat chest x-ray to rule out pneumonia and left lung.   Follow Up Instructions: Follow-up with worsening unresolved symptoms.    I discussed the assessment and treatment plan with the patient. The patient was provided an opportunity to ask questions and all were answered. The patient agreed with the plan and demonstrated an understanding of the instructions.   The patient was advised to call back or seek an in-person evaluation if the symptoms worsen or if the condition fails to improve as anticipated.  The above assessment and management plan was discussed with the patient. The patient verbalized understanding of and has agreed to the management plan. Patient is aware to call the clinic if symptoms persist or worsen. Patient is aware when to return to the clinic for a follow-up visit. Patient educated on when it is appropriate to go to the emergency department.   Time call ended: 11:26 AM  I provided 11 minutes of  non face-to-face time during this encounter.    Ivy Lynn, NP

## 2020-12-10 NOTE — Assessment & Plan Note (Signed)
  For hospital follow-up-completed discharge instruction with patient about a phone medication reconciliation and instructions provided patient verbalized understanding.

## 2020-12-28 NOTE — TOC Progression Note (Signed)
Transition of Care [facility]) - Progression Note    Patient Details  Name: Sherry Bender MRN: [id number] Date of Birth: 1951-01-30  Transition of Care [facility]) CM/SW [place], [place] Phone Number: 12/28/2020, 2:20 PM  Clinical Narrative:   Received call from AD stating patient had called asking for help because of lack of follow through by [facility], to whom patient was referred for home services.  Patient is seen by [facility]. Suggested he refer her back to them to ask for help with advocating for her with [facility].          Expected Discharge Plan and Services           Expected Discharge Date: 12/01/20                                     Social Determinants of Health (SDOH) Interventions    Readmission Risk Interventions No flowsheet data found.

## 2021-01-19 ENCOUNTER — Telehealth: Payer: Self-pay | Admitting: Family

## 2021-01-19 NOTE — Telephone Encounter (Signed)
1-Pt has never heard from PT/OT - no one has called. No one can come out to the house.   2- needs CXR next week to rck PNA  3- wants someone to help come in few times a week to help with grooming, dressing and prepping foods.    NO Home health currently.

## 2021-01-20 NOTE — Telephone Encounter (Signed)
Please schedule patient a visit with me.

## 2021-01-20 NOTE — Telephone Encounter (Signed)
APPT MADE

## 2021-01-28 ENCOUNTER — Ambulatory Visit (INDEPENDENT_AMBULATORY_CARE_PROVIDER_SITE_OTHER): Payer: 59 | Admitting: Family

## 2021-01-28 ENCOUNTER — Ambulatory Visit (INDEPENDENT_AMBULATORY_CARE_PROVIDER_SITE_OTHER): Payer: 59

## 2021-01-28 ENCOUNTER — Encounter: Payer: Self-pay | Admitting: Family

## 2021-01-28 ENCOUNTER — Other Ambulatory Visit: Payer: Self-pay

## 2021-01-28 VITALS — BP 130/81 | HR 66 | Temp 98.5°F | Ht 64.0 in

## 2021-01-28 DIAGNOSIS — E440 Moderate protein-calorie malnutrition: Secondary | ICD-10-CM | POA: Insufficient documentation

## 2021-01-28 DIAGNOSIS — R29898 Other symptoms and signs involving the musculoskeletal system: Secondary | ICD-10-CM

## 2021-01-28 DIAGNOSIS — I5021 Acute systolic (congestive) heart failure: Secondary | ICD-10-CM | POA: Diagnosis not present

## 2021-01-28 DIAGNOSIS — I10 Essential (primary) hypertension: Secondary | ICD-10-CM | POA: Diagnosis not present

## 2021-01-28 DIAGNOSIS — J440 Chronic obstructive pulmonary disease with acute lower respiratory infection: Secondary | ICD-10-CM | POA: Diagnosis not present

## 2021-01-28 DIAGNOSIS — K219 Gastro-esophageal reflux disease without esophagitis: Secondary | ICD-10-CM

## 2021-01-28 DIAGNOSIS — Z23 Encounter for immunization: Secondary | ICD-10-CM

## 2021-01-28 DIAGNOSIS — D509 Iron deficiency anemia, unspecified: Secondary | ICD-10-CM

## 2021-01-28 DIAGNOSIS — J209 Acute bronchitis, unspecified: Secondary | ICD-10-CM | POA: Diagnosis not present

## 2021-01-28 DIAGNOSIS — F411 Generalized anxiety disorder: Secondary | ICD-10-CM

## 2021-01-28 DIAGNOSIS — E785 Hyperlipidemia, unspecified: Secondary | ICD-10-CM

## 2021-01-28 DIAGNOSIS — I2510 Atherosclerotic heart disease of native coronary artery without angina pectoris: Secondary | ICD-10-CM

## 2021-01-28 DIAGNOSIS — R636 Underweight: Secondary | ICD-10-CM

## 2021-01-28 MED ORDER — ENSURE NUTRITION SHAKE PO LIQD: 237.0000 mL | Freq: Three times a day (TID) | ORAL | 6 refills | Status: DC

## 2021-01-28 NOTE — Progress Notes (Addendum)
Subjective:    Patient ID: Sherry Bender, female    DOB: [birth year], 70 y.o.   MRN: [id number]  Chief Complaint  Patient presents with   Medical Management of Chronic Issues    Follow up on lungs     Pt call the office today for  chronic follow up. Pt is followed by Oncologists annually for hx Breast cancer.  She is followed by Cardiologists annually.   She was in the hospital on 11/27/20 for COPD exacerbation  and CAP. Anemia Presents for follow-up visit. Symptoms include malaise/fatigue. There has been no bruising/bleeding easily.  Hypertension This is a chronic problem. The current episode started more than 1 year ago. The problem has been resolved since onset. Associated symptoms include anxiety, malaise/fatigue and shortness of breath. Pertinent negatives include no peripheral edema. Risk factors for coronary artery disease include dyslipidemia. The current treatment provides moderate improvement.  Gastroesophageal Reflux She complains of belching and heartburn. This is a chronic problem. The current episode started more than 1 year ago. The problem occurs occasionally. She has tried a PPI for the symptoms. The treatment provided moderate relief.  Anxiety Presents for follow-up visit. Symptoms include depressed mood, excessive worry, irritability, nervous/anxious behavior and shortness of breath. Symptoms occur most days. The severity of symptoms is moderate. The quality of sleep is good.   Her past medical history is significant for anemia.     Review of Systems  Constitutional:  Positive for irritability and malaise/fatigue.  Respiratory:  Positive for shortness of breath.   Gastrointestinal:  Positive for heartburn.  Hematological:  Does not bruise/bleed easily.  Psychiatric/Behavioral:  The patient is nervous/anxious.   All other systems reviewed and are negative.     Objective:   Physical Exam Vitals reviewed.  Constitutional:      General: She is not in acute  distress.    Appearance: She is well-developed.     Comments: underweight  HENT:     Head: Normocephalic and atraumatic.     Right Ear: Tympanic membrane normal.     Left Ear: Tympanic membrane normal.  Eyes:     Pupils: Pupils are equal, round, and reactive to light.  Neck:     Thyroid: No thyromegaly.  Cardiovascular:     Rate and Rhythm: Normal rate and regular rhythm.     Heart sounds: Normal heart sounds. No murmur heard. Pulmonary:     Effort: Pulmonary effort is normal. No respiratory distress.     Breath sounds: Normal breath sounds. No wheezing.  Abdominal:     General: Bowel sounds are normal. There is no distension.     Palpations: Abdomen is soft.     Tenderness: There is no abdominal tenderness.  Musculoskeletal:        General: No tenderness. Normal range of motion.     Cervical back: Normal range of motion and neck supple.  Skin:    General: Skin is warm and dry.  Neurological:     Mental Status: She is alert and oriented to person, place, and time.     Cranial Nerves: No cranial nerve deficit.     Motor: Weakness present.     Deep Tendon Reflexes: Reflexes are normal and symmetric.     Comments: In wheelchair  Psychiatric:        Behavior: Behavior normal.        Thought Content: Thought content normal.        Judgment: Judgment normal.     BP 130/81  Pulse 66   Temp 98.5 F (36.9 C) (Temporal)   Ht 5' 4"  (1.626 m)   LMP 02/28/1999   SpO2 91%   BMI 15.79 kg/m      Assessment & Plan:  [name] comes in today with chief complaint of Medical Management of Chronic Issues (Follow up on lungs )   Diagnosis and orders addressed:  1. Primary hypertension  - CBC with Differential/Platelet - CMP14+EGFR  2. Acute systolic heart failure (HCC) - CBC with Differential/Platelet - CMP14+EGFR  3. Gastroesophageal reflux disease, unspecified whether esophagitis present - CBC with Differential/Platelet - CMP14+EGFR  4. Iron deficiency anemia,  unspecified iron deficiency anemia type - DG Chest 2 View; Future - CBC with Differential/Platelet - CMP14+EGFR  5. Hyperlipidemia, unspecified hyperlipidemia type  - CBC with Differential/Platelet - CMP14+EGFR  6. Underweight - CBC with Differential/Platelet - CMP14+EGFR - Ambulatory referral to Home Health  7. Generalized anxiety disorder - CBC with Differential/Platelet - CMP14+EGFR  8. Acute bronchitis with COPD ([place]) - DG Chest 2 View; Future - CBC with Differential/Platelet - CMP14+EGFR  9. Moderate protein-calorie malnutrition (HCC) - Nutritional Supplements (ENSURE NUTRITION SHAKE) LIQD; Take 237 mLs by mouth 3 (three) times daily.  Dispense: 2370 mL; Refill: 6 - CBC with Differential/Platelet - CMP14+EGFR - Ambulatory referral to [facility]  10. Muscular deconditioning  - Ambulatory referral to [facility] pending Health Maintenance reviewed Diet and exercise encouraged  Follow up plan: 3 months    Sherry Dun, FNP

## 2021-01-28 NOTE — Addendum Note (Signed)
Addended by: Ladean Raya on: 01/28/2021 04:55 PM   Modules accepted: Orders

## 2021-01-28 NOTE — Patient Instructions (Signed)
Protein-Energy Malnutrition Protein-energy malnutrition is when a person does not eat enough protein, fat, and calories. When this happens over time, it can lead to severe loss of muscle tissue (muscle wasting). This condition also affects the body's defense system (immune system) and can lead to other health problems. What are the causes? This condition may be caused by: Not eating enough protein, fat, or calories. Having certain chronic medical conditions. Eating too little. What increases the risk? The following factors may make you more likely to develop this condition: Living in poverty. Long-term hospitalization. Alcohol or drug dependency. Addiction often leads to a lifestyle in which proper diet is ignored. Dependency can also hurt the metabolism and the body's ability to absorb nutrients. Eating disorders, such as anorexia nervosa or bulimia. Chewing or swallowing problems. People with these disorders may not eat enough. Having certain conditions, such as: Inflammatory bowel disease. Inflammation of the intestines makes it difficult for the body to absorb nutrients. Cancer or AIDS. These diseases can cause a loss of appetite. Chronic heart failure. This interferes with how the body uses nutrients. Cystic fibrosis. This disease can make it difficult for the body to absorb nutrients. Eating a diet that extremely restricts protein, fat, or calorie intake. What are the signs or symptoms? Symptoms of this condition include: Tiredness (fatigue). Weakness. Dizziness. Fainting. Weight loss. Loss of muscle tone and muscle mass. Poor immune response. Lack of menstruation. Poor memory. Hair loss. Skin changes. How is this diagnosed? This condition may be diagnosed based on: Your medical and dietary history. A physical exam. This may include a measurement of your body mass index. Blood tests. How is this treated? This condition may be managed with: Nutrition therapy. This may  include working with a dietitian. Treatment for underlying conditions. People with severe protein-energy malnutrition may need to be treated in a hospital. This may involve receiving nutrition and fluids through an IV. Follow these instructions at home:  Eat a balanced diet. In each meal, include at least one food that is high in protein. Foods that are high in protein include: Meat. Poultry. Fish. Eggs. Cheese. Milk. Beans. Nuts. Eat nutrient-rich foods that are easy to swallow and digest, such as: Fruit and yogurt smoothies. Oatmeal with nut butter. Nutrition supplement drinks. Try to eat six small meals each day instead of three large meals. Take vitamin and protein supplements as told by your health care provider or dietitian. Follow your health care provider's recommendations about exercise and activity. Keep all follow-up visits. This is important. Contact a health care provider if: You have increased weakness or fatigue. You faint. You are a woman and you stop having your period (menstruating). You have rapid hair loss. You have unexpected weight loss. You have diarrhea. You have nausea and vomiting. Get help right away if: You have difficulty breathing. You have chest pain. These symptoms may represent a serious problem that is an emergency. Do not wait to see if the symptoms will go away. Get medical help right away. Call your local emergency services (911 in the [place] Do not drive yourself to the hospital. Summary Protein-energy malnutrition is when a person does not eat enough protein, fat, and calories. Protein-energy malnutrition can lead to severe loss of muscle tissue (muscle wasting). This condition also affects the body's defense system (immune system) and can lead to other health problems. Talk with your health care provider about treatment for this condition. Effective treatment depends on the underlying cause of the malnutrition. This information is not    intended to replace advice given to you by your health care provider. Make sure you discuss any questions you have with your health care provider. Document Revised: 06/05/2020 Document Reviewed: 06/05/2020 Elsevier Patient Education  2022 [employer]  

## 2021-01-28 NOTE — Addendum Note (Signed)
Addended by: Evelina Dun A on: 01/28/2021 04:52 PM   Modules accepted: Orders

## 2021-01-29 ENCOUNTER — Other Ambulatory Visit: Payer: Self-pay | Admitting: Cardiology

## 2021-01-29 LAB — CMP14+EGFR
ALT: 21 IU/L (ref 0–32)
AST: 28 IU/L (ref 0–40)
Albumin/Globulin Ratio: 2 (ref 1.2–2.2)
Albumin: 4.5 g/dL (ref 3.8–4.8)
Alkaline Phosphatase: 59 IU/L (ref 44–121)
BUN/Creatinine Ratio: 47 — ABNORMAL HIGH (ref 12–28)
BUN: 15 mg/dL (ref 8–27)
Bilirubin Total: 0.3 mg/dL (ref 0.0–1.2)
CO2: 27 mmol/L (ref 20–29)
Calcium: 9.5 mg/dL (ref 8.7–10.3)
Chloride: 90 mmol/L — ABNORMAL LOW (ref 96–106)
Creatinine, Ser: 0.32 mg/dL — ABNORMAL LOW (ref 0.57–1.00)
Globulin, Total: 2.2 g/dL (ref 1.5–4.5)
Glucose: 86 mg/dL (ref 65–99)
Potassium: 5.1 mmol/L (ref 3.5–5.2)
Sodium: 133 mmol/L — ABNORMAL LOW (ref 134–144)
Total Protein: 6.7 g/dL (ref 6.0–8.5)
eGFR: 113 mL/min/{1.73_m2} (ref >59)

## 2021-01-29 LAB — CBC WITH DIFFERENTIAL/PLATELET
Basophils Absolute: 0.1 10*3/uL (ref 0.0–0.2)
Basos: 1 %
EOS (ABSOLUTE): 0.3 10*3/uL (ref 0.0–0.4)
Eos: 6 %
Hematocrit: 35.3 % (ref 34.0–46.6)
Hemoglobin: 11.3 g/dL (ref 11.1–15.9)
Immature Grans (Abs): 0 10*3/uL (ref 0.0–0.1)
Immature Granulocytes: 0 %
Lymphocytes Absolute: 0.7 10*3/uL (ref 0.7–3.1)
Lymphs: 17 %
MCH: 27.8 pg (ref 26.6–33.0)
MCHC: 32 g/dL (ref 31.5–35.7)
MCV: 87 fL (ref 79–97)
Monocytes Absolute: 0.6 10*3/uL (ref 0.1–0.9)
Monocytes: 14 %
Neutrophils Absolute: 2.6 10*3/uL (ref 1.4–7.0)
Neutrophils: 62 %
Platelets: 171 10*3/uL (ref 150–450)
RBC: 4.06 x10E6/uL (ref 3.77–5.28)
RDW: 12.8 % (ref 11.7–15.4)
WBC: 4.2 10*3/uL (ref 3.4–10.8)

## 2021-02-07 ENCOUNTER — Telehealth: Payer: Self-pay | Admitting: *Deleted

## 2021-02-07 NOTE — Telephone Encounter (Signed)
Vm from [employer] PT w/ [facility] Pt refused PT, wants VO for OT eval Pt want to work on left shoulder instead of gait issues Please advise and call back w/ Verbal ok if OT approved

## 2021-02-08 NOTE — Telephone Encounter (Signed)
Please give verbal for OT.

## 2021-02-08 NOTE — Telephone Encounter (Signed)
VO given for OT eval

## 2021-02-09 ENCOUNTER — Other Ambulatory Visit: Payer: Self-pay | Admitting: Family Medicine

## 2021-02-09 DIAGNOSIS — J449 Chronic obstructive pulmonary disease, unspecified: Secondary | ICD-10-CM

## 2021-02-14 ENCOUNTER — Ambulatory Visit (INDEPENDENT_AMBULATORY_CARE_PROVIDER_SITE_OTHER): Payer: 59

## 2021-02-14 ENCOUNTER — Other Ambulatory Visit: Payer: Self-pay

## 2021-02-14 DIAGNOSIS — I5021 Acute systolic (congestive) heart failure: Secondary | ICD-10-CM | POA: Diagnosis not present

## 2021-02-14 DIAGNOSIS — R1310 Dysphagia, unspecified: Secondary | ICD-10-CM

## 2021-02-14 DIAGNOSIS — I10 Essential (primary) hypertension: Secondary | ICD-10-CM

## 2021-02-14 DIAGNOSIS — S42202D Unspecified fracture of upper end of left humerus, subsequent encounter for fracture with routine healing: Secondary | ICD-10-CM

## 2021-02-14 DIAGNOSIS — R636 Underweight: Secondary | ICD-10-CM

## 2021-02-14 DIAGNOSIS — J9601 Acute respiratory failure with hypoxia: Secondary | ICD-10-CM

## 2021-02-14 DIAGNOSIS — F418 Other specified anxiety disorders: Secondary | ICD-10-CM

## 2021-02-14 DIAGNOSIS — K219 Gastro-esophageal reflux disease without esophagitis: Secondary | ICD-10-CM | POA: Diagnosis not present

## 2021-02-14 DIAGNOSIS — M6281 Muscle weakness (generalized): Secondary | ICD-10-CM

## 2021-02-14 DIAGNOSIS — J441 Chronic obstructive pulmonary disease with (acute) exacerbation: Secondary | ICD-10-CM

## 2021-02-14 DIAGNOSIS — E440 Moderate protein-calorie malnutrition: Secondary | ICD-10-CM

## 2021-02-14 DIAGNOSIS — D509 Iron deficiency anemia, unspecified: Secondary | ICD-10-CM | POA: Diagnosis not present

## 2021-02-14 DIAGNOSIS — E785 Hyperlipidemia, unspecified: Secondary | ICD-10-CM

## 2021-02-16 ENCOUNTER — Telehealth: Payer: Self-pay | Admitting: *Deleted

## 2021-02-16 NOTE — Telephone Encounter (Signed)
VM from [facility] w/ [facility] FYI: pt's HR was 28 during visit, their parameters are 12-24. Pt was not in distress and not due her inhaler for a couple more hours. O2 was between 95-98 and HR was 85/86

## 2021-02-17 NOTE — Telephone Encounter (Signed)
Is this her heart rate or respirations? If she still has respirations of 28 she needs to go to the ED.

## 2021-02-17 NOTE — Telephone Encounter (Signed)
Lmtcb.

## 2021-02-25 ENCOUNTER — Other Ambulatory Visit: Payer: Self-pay | Admitting: Family

## 2021-02-25 DIAGNOSIS — N393 Stress incontinence (female) (male): Secondary | ICD-10-CM

## 2021-03-08 ENCOUNTER — Encounter (HOSPITAL_COMMUNITY): Payer: Self-pay

## 2021-03-08 ENCOUNTER — Emergency Department (HOSPITAL_COMMUNITY)
Admission: EM | Admit: 2021-03-08 | Discharge: 2021-03-08 | Disposition: A | Discharge status: Home / Self Care | Payer: 59 | Attending: Emergency Medicine | Admitting: Emergency Medicine

## 2021-03-08 ENCOUNTER — Emergency Department (HOSPITAL_COMMUNITY): Payer: 59

## 2021-03-08 DIAGNOSIS — J441 Chronic obstructive pulmonary disease with (acute) exacerbation: Secondary | ICD-10-CM | POA: Insufficient documentation

## 2021-03-08 DIAGNOSIS — Z20822 Contact with and (suspected) exposure to covid-19: Secondary | ICD-10-CM | POA: Diagnosis not present

## 2021-03-08 DIAGNOSIS — J069 Acute upper respiratory infection, unspecified: Secondary | ICD-10-CM | POA: Insufficient documentation

## 2021-03-08 DIAGNOSIS — Z7982 Long term (current) use of aspirin: Secondary | ICD-10-CM | POA: Diagnosis not present

## 2021-03-08 DIAGNOSIS — I5021 Acute systolic (congestive) heart failure: Secondary | ICD-10-CM | POA: Insufficient documentation

## 2021-03-08 DIAGNOSIS — Z96612 Presence of left artificial shoulder joint: Secondary | ICD-10-CM | POA: Diagnosis not present

## 2021-03-08 DIAGNOSIS — Z853 Personal history of malignant neoplasm of breast: Secondary | ICD-10-CM | POA: Insufficient documentation

## 2021-03-08 DIAGNOSIS — Z7951 Long term (current) use of inhaled steroids: Secondary | ICD-10-CM | POA: Insufficient documentation

## 2021-03-08 DIAGNOSIS — I110 Hypertensive heart disease with heart failure: Secondary | ICD-10-CM | POA: Insufficient documentation

## 2021-03-08 DIAGNOSIS — Z87891 Personal history of nicotine dependence: Secondary | ICD-10-CM | POA: Diagnosis not present

## 2021-03-08 DIAGNOSIS — Z79899 Other long term (current) drug therapy: Secondary | ICD-10-CM | POA: Insufficient documentation

## 2021-03-08 DIAGNOSIS — R0602 Shortness of breath: Secondary | ICD-10-CM | POA: Diagnosis present

## 2021-03-08 DIAGNOSIS — Z85828 Personal history of other malignant neoplasm of skin: Secondary | ICD-10-CM | POA: Diagnosis not present

## 2021-03-08 LAB — COMPREHENSIVE METABOLIC PANEL
ALT: 24 U/L (ref 0–44)
AST: 28 U/L (ref 15–41)
Albumin: 4.1 g/dL (ref 3.5–5.0)
Alkaline Phosphatase: 58 U/L (ref 38–126)
Anion gap: 12 (ref 5–15)
BUN: 11 mg/dL (ref 8–23)
CO2: 29 mmol/L (ref 22–32)
Calcium: 9.6 mg/dL (ref 8.9–10.3)
Chloride: 89 mmol/L — ABNORMAL LOW (ref 98–111)
Creatinine, Ser: <0.3 mg/dL — ABNORMAL LOW (ref 0.44–1.00)
Glucose, Bld: 92 mg/dL (ref 70–99)
Potassium: 4 mmol/L (ref 3.5–5.1)
Sodium: 130 mmol/L — ABNORMAL LOW (ref 135–145)
Total Bilirubin: 0.8 mg/dL (ref 0.3–1.2)
Total Protein: 7.2 g/dL (ref 6.5–8.1)

## 2021-03-08 LAB — CBC WITH DIFFERENTIAL/PLATELET
Abs Immature Granulocytes: 0.01 10*3/uL (ref 0.00–0.07)
Basophils Absolute: 0 10*3/uL (ref 0.0–0.1)
Basophils Relative: 1 %
Eosinophils Absolute: 0.1 10*3/uL (ref 0.0–0.5)
Eosinophils Relative: 2 %
HCT: 35.5 % — ABNORMAL LOW (ref 36.0–46.0)
Hemoglobin: 11.4 g/dL — ABNORMAL LOW (ref 12.0–15.0)
Immature Granulocytes: 0 %
Lymphocytes Relative: 10 %
Lymphs Abs: 0.5 10*3/uL — ABNORMAL LOW (ref 0.7–4.0)
MCH: 27.7 pg (ref 26.0–34.0)
MCHC: 32.1 g/dL (ref 30.0–36.0)
MCV: 86.2 fL (ref 80.0–100.0)
Monocytes Absolute: 0.6 10*3/uL (ref 0.1–1.0)
Monocytes Relative: 11 %
Neutro Abs: 4 10*3/uL (ref 1.7–7.7)
Neutrophils Relative %: 76 %
Platelets: 174 10*3/uL (ref 150–400)
RBC: 4.12 MIL/uL (ref 3.87–5.11)
RDW: 13.4 % (ref 11.5–15.5)
WBC: 5.3 10*3/uL (ref 4.0–10.5)
nRBC: 0 % (ref 0.0–0.2)

## 2021-03-08 LAB — RESP PANEL BY RT-PCR (FLU A&B, COVID) ARPGX2
Influenza A by PCR: NEGATIVE
Influenza B by PCR: NEGATIVE
SARS Coronavirus 2 by RT PCR: NEGATIVE

## 2021-03-08 LAB — BRAIN NATRIURETIC PEPTIDE: B Natriuretic Peptide: 112.8 pg/mL — ABNORMAL HIGH (ref 0.0–100.0)

## 2021-03-08 LAB — TROPONIN I (HIGH SENSITIVITY)
Troponin I (High Sensitivity): 94 ng/L — ABNORMAL HIGH (ref <18)
Troponin I (High Sensitivity): 96 ng/L — ABNORMAL HIGH (ref <18)

## 2021-03-08 MED ORDER — ALBUTEROL SULFATE HFA 108 (90 BASE) MCG/ACT IN AERS
6.0000 | INHALATION_SPRAY | Freq: Once | RESPIRATORY_TRACT | Status: AC
  Administered 2021-03-08: 6 via RESPIRATORY_TRACT

## 2021-03-08 MED ORDER — PREDNISONE 20 MG PO TABS
60.0000 mg | ORAL_TABLET | Freq: Once | ORAL | Status: AC
  Administered 2021-03-08: 60 mg via ORAL
  Filled 2021-03-08: qty 3

## 2021-03-08 MED ORDER — ALBUTEROL SULFATE HFA 108 (90 BASE) MCG/ACT IN AERS
2.0000 | INHALATION_SPRAY | RESPIRATORY_TRACT | Status: DC | PRN
  Filled 2021-03-08: qty 6.7

## 2021-03-08 MED ORDER — PREDNISONE 20 MG PO TABS
ORAL_TABLET | ORAL | 0 refills | Status: DC
Start: 2021-03-08 — End: 2021-03-22

## 2021-03-08 MED ORDER — IPRATROPIUM BROMIDE HFA 17 MCG/ACT IN AERS
2.0000 | INHALATION_SPRAY | Freq: Once | RESPIRATORY_TRACT | Status: AC
  Administered 2021-03-08: 2 via RESPIRATORY_TRACT
  Filled 2021-03-08: qty 12.9

## 2021-03-08 MED ORDER — AEROCHAMBER PLUS FLO-VU MISC
1.0000 | Freq: Once | Status: AC
  Administered 2021-03-08: 1
  Filled 2021-03-08: qty 1

## 2021-03-08 MED ORDER — AMOXICILLIN-POT CLAVULANATE 875-125 MG PO TABS: 1.0000 | ORAL_TABLET | Freq: Two times a day (BID) | ORAL | 0 refills | Status: DC

## 2021-03-08 MED ORDER — BENZONATATE 100 MG PO CAPS: 100.0000 mg | ORAL_CAPSULE | Freq: Three times a day (TID) | ORAL | 0 refills | Status: DC

## 2021-03-08 NOTE — ED Triage Notes (Signed)
Pt reports she is a cancer patient. Patient c/o shob that started yesterday morning at 2am. Reports hx of recent pneumonia.   Denies chest pain.   A/Ox4

## 2021-03-08 NOTE — ED Provider Notes (Signed)
[facility] DEPT Provider Note   CSN: [id number] Arrival date & time: 03/08/21  1139     History Chief Complaint  Patient presents with   Shortness of Breath    CA pt    Sherry Bender is a 70 y.o. female.  70 yo F with a chief complaints of cough congestion shortness of breath going on for a few days now.  Patient states that when she lays back flat it gets much worse.  Is having significant nasal drip.  She denies fevers or chills.  Denies chest pain or pressure.  Denies abdominal discomfort worse than baseline.  She had an episode like this previously and was diagnosed with pneumonia and a COPD exacerbation.  Feels somewhat similar.  She is also concerned that she may have contracted aspiration pneumonia.  Tells me that is what a respiratory therapist told her she might of had the last time she was in the hospital.  The history is provided by the patient.  Shortness of Breath Severity:  Moderate Onset quality:  Gradual Duration:  3 days Timing:  Constant Progression:  Worsening Chronicity:  New Relieved by:  Nothing Worsened by:  Nothing Ineffective treatments:  None tried Associated symptoms: cough   Associated symptoms: no chest pain, no fever, no headaches, no sore throat, no vomiting and no wheezing       Past Medical History:  Diagnosis Date   Anxiety    Arthritis    Breast cancer ([place]) 2016   Cardiomyopathy ([place])    a. EF 25-30% by echocardiogram in 02/2019 b. 30-35% in 05/2019 c. normalized to 55-60% by echo in 02/2020   Coronary atherosclerosis    Cardiac CTA with negative FFR, nonobstructive March 2021   Cricopharyngeus muscle dysfunction    Gastric ulcer    GERD (gastroesophageal reflux disease)    Hiatal hernia    Hyperlipidemia    Hypertension    PONV (postoperative nausea and vomiting)    Scoliosis    Scoliosis    Skin cancer    Urinary frequency     Patient Active Problem List   Diagnosis Date Noted   Moderate  protein-calorie malnutrition ([place]) 01/28/2021   Cough in adult 11/28/2020   Acute respiratory failure with hypoxia ([place]) 11/28/2020   Hyponatremia 11/28/2020   Lower extremity edema 11/28/2020   Closed fracture of left proximal humerus 07/15/2020   Acute gastric ulcer without hemorrhage or perforation    Gastritis and gastroduodenitis    Acute systolic heart failure ([facility])    Acute respiratory failure ([place]) 02/18/2019   Impingement syndrome of left shoulder 01/02/2019   Closed fracture of head of left humerus 06/01/2017   Underweight 12/25/2016   Loss of weight 07/03/2016   History of esophageal stricture 07/03/2016   Dysphagia 06/08/2016   Hypersensitivity reaction 12/02/2015   Iron deficiency anemia 11/24/2015   Breast cancer of upper-outer quadrant of left female breast ([place]) 11/11/2014   S/P cervical spinal fusion 06/24/2014   HTN (hypertension) 12/06/2010   Hyperlipidemia 09/26/2010   Nodular goiter 09/26/2010   GERD (gastroesophageal reflux disease) 09/26/2010   Degenerative disc disease 09/26/2010   Allergic rhinitis 09/26/2010   Anxiety disorder 09/26/2010   Cyst, breast 09/26/2010    Past Surgical History:  Procedure Laterality Date   ABDOMINAL HYSTERECTOMY  2000   TAH/BSO   ANTERIOR CERVICAL DECOMP/DISCECTOMY FUSION N/A 06/18/2014   Procedure: C4-5 C5-6 C6-7 ANTERIOR CERVICAL DECOMPRESSION/DISKECTOMY/FUSION;  Surgeon: Eustace Moore, MD;  Location: [facility];  Service: Neurosurgery;  Laterality: N/A;  C4-5 C5-6 C6-7 ANTERIOR CERVICAL DECOMPRESSION/DISKECTOMY/FUSION   ANTERIOR CERVICAL DECOMP/DISCECTOMY FUSION N/A 06/25/2014   Procedure: EXPLORATION OF CERVICAL FUSION WITH REVIOSN OF HARDWARE;  Surgeon: Eustace Moore, MD;  Location: [facility];  Service: Neurosurgery;  Laterality: N/A;   BIOPSY  01/01/2020   Procedure: BIOPSY;  Surgeon: Mauri Pole, MD;  Location: [facility];  Service: Endoscopy;;   CERVICAL FUSION     CHOLECYSTECTOMY  1994   COMPLETE  MASTECTOMY W/ SENTINEL NODE BIOPSY Left 04/30/2015   ESOPHAGOGASTRODUODENOSCOPY (EGD) WITH PROPOFOL N/A 01/01/2020   Procedure: ESOPHAGOGASTRODUODENOSCOPY (EGD) WITH PROPOFOL;  Surgeon: Mauri Pole, MD;  Location: [facility];  Service: Endoscopy;  Laterality: N/A;   FOOT NEUROMA SURGERY  01/2010   also had metatarasl shortened   KNEE ARTHROSCOPY  2005   right knee   MASTECTOMY Left 2016   PORT-A-CATH REMOVAL Right 04/30/2015   Procedure: REMOVAL PORT-A-CATH;  Surgeon: Fanny Skates, MD;  Location: [facility];  Service: General;  Laterality: Right;   PORTACATH PLACEMENT N/A 11/23/2014   Procedure: INSERTION PORT-A-CATH WITH ULTRASOUND;  Surgeon: Fanny Skates, MD;  Location: [facility];  Service: General;  Laterality: N/A;   REVERSE SHOULDER ARTHROPLASTY Left 07/15/2020   Procedure: REVERSE SHOULDER ARTHROPLASTY;  Surgeon: Mordecai Rasmussen, MD;  Location: [facility];  Service: Orthopedics;  Laterality: Left;   RHINOPLASTY     SIMPLE MASTECTOMY WITH AXILLARY SENTINEL NODE BIOPSY Left 04/30/2015   Procedure: LEFT TOTAL  MASTECTOMY WITH LEFT AXILLARY SENTINEL NODE BIOPSY;  Surgeon: Fanny Skates, MD;  Location: [facility];  Service: General;  Laterality: Left;     OB History   No obstetric history on file.     Family History  Problem Relation Age of Onset   Heart disease Mother    Hypertension Mother    Diabetes Mother    Heart disease Father    Hypertension Father    Cancer Maternal Grandmother        ? ovarian cancer vs. cervical   Cancer Paternal Aunt        ? breast    Social History   Tobacco Use   Smoking status: Former    Packs/day: 0.25    Years: 42.00    Pack years: 10.50    Types: Cigarettes    Quit date: 01/24/2017    Years since quitting: 4.1   Smokeless tobacco: Never  Vaping Use   Vaping Use: Never used  Substance Use Topics   Alcohol use: No   Drug use: No    Home Medications Prior to Admission medications   Medication Sig Start Date End Date  Taking? Authorizing Provider  albuterol (VENTOLIN HFA) 108 (90 Base) MCG/ACT inhaler Inhale 2 puffs into the lungs every 6 (six) hours as needed for wheezing or shortness of breath.   Yes [provider]  amoxicillin-clavulanate (AUGMENTIN) 875-125 MG tablet Take 1 tablet by mouth 2 (two) times daily. One po bid x 7 days 03/08/21  Yes Deno Etienne, DO  aspirin EC 81 MG tablet Take 1 tablet (81 mg total) by mouth daily. Swallow whole. 10/08/20  Yes Strader, [place] M, PA-C  atorvastatin (LIPITOR) 20 MG tablet TAKE 1 TABLET BY MOUTH EVERY DAY Patient taking differently: Take 20 mg by mouth daily. 01/31/21  Yes Satira Sark, MD  benzonatate (TESSALON) 100 MG capsule Take 1 capsule (100 mg total) by mouth every 8 (eight) hours. 03/08/21  Yes Deno Etienne, DO  carvedilol ([place])  3.125 MG tablet Take 0.5 tablets (1.55 mg total) by mouth in the morning and at bedtime. 11/09/20  Yes Satira Sark, MD  diphenhydrAMINE (BENADRYL) 25 MG tablet Take 25 mg by mouth at bedtime.   Yes [provider]  ENTRESTO 24-26 MG TAKE 1 TABLET BY MOUTH 2 (TWO) TIMES DAILY. DUE FOR FOLLOW UP APPT Patient taking differently: Take 1 tablet by mouth 2 (two) times daily. 05/31/20  Yes BranchAlphonse Guild, MD  esomeprazole (NEXIUM) 40 MG capsule Take 1 capsule (40 mg total) by mouth daily. 05/18/20 05/18/21 Yes Hawks, Christy A, FNP  fluticasone-salmeterol (ADVAIR) 250-50 MCG/ACT AEPB Inhale 1 puff into the lungs in the morning and at bedtime. 02/09/21  Yes Gwenlyn Perking, FNP  ibuprofen (ADVIL) 200 MG tablet Take 400 mg by mouth every 6 (six) hours as needed for moderate pain.   Yes [provider]  Lidocaine 4 % PTCH Apply 1 patch topically daily as needed (pain).   Yes [provider]  Nutritional Supplements (ENSURE NUTRITION SHAKE) LIQD Take 237 mLs by mouth 3 (three) times daily. 01/28/21  Yes Hawks, Alyse Low A, FNP  predniSONE (DELTASONE) 20 MG tablet 2 tabs po daily x 4 days 03/08/21   Yes Deno Etienne, DO  Propylene Glycol (SYSTANE BALANCE) 0.6 % SOLN Place 1 drop into both eyes daily as needed (dry eyes).   Yes [provider]  tolterodine (DETROL LA) 2 MG 24 hr capsule TAKE 1 CAPSULE (2 MG TOTAL) BY MOUTH DAILY. 02/25/21  Yes Hawks, Christy A, FNP  triamcinolone ointment (KENALOG) 0.5 % Apply 1 application topically 2 (two) times daily. Patient taking differently: Apply 1 application topically 2 (two) times daily as needed (rash). 11/04/20  Yes Nicholas Lose, MD  albuterol (PROVENTIL) (2.5 MG/3ML) 0.083% nebulizer solution Take 3 mLs (2.5 mg total) by nebulization every 6 (six) hours as needed for wheezing or shortness of breath. 11/02/20   Sharion Balloon, FNP  furosemide (LASIX) 20 MG tablet Take 1 tablet (20 mg total) by mouth daily. Patient not taking: No sig reported 11/09/20   Claretta Fraise, MD    Allergies    Hydrocodone, Codeine, and Spironolactone  Review of Systems   Review of Systems  Constitutional:  Negative for chills and fever.  HENT:  Positive for postnasal drip and rhinorrhea. Negative for congestion, sinus pressure, sinus pain and sore throat.   Eyes:  Negative for redness and visual disturbance.  Respiratory:  Positive for cough and shortness of breath. Negative for wheezing.   Cardiovascular:  Negative for chest pain and palpitations.  Gastrointestinal:  Negative for nausea and vomiting.  Genitourinary:  Negative for dysuria and urgency.  Musculoskeletal:  Negative for arthralgias and myalgias.  Skin:  Negative for pallor and wound.  Neurological:  Negative for dizziness and headaches.   Physical Exam Updated Vital Signs BP 119/87   Pulse (!) 109   Temp 98.1 F (36.7 C) (Oral)   Resp 19   LMP 02/28/1999   SpO2 96%   Physical Exam Vitals and nursing note reviewed.  Constitutional:      General: She is not in acute distress.    Appearance: She is well-developed. She is not diaphoretic.  HENT:     Head: Normocephalic and atraumatic.      Comments: Swollen turbinates, posterior nasal drip, no noted sinus ttp, tm normal bilaterally.   Eyes:     Pupils: Pupils are equal, round, and reactive to light.  Cardiovascular:     Rate  and Rhythm: Normal rate and regular rhythm.     Heart sounds: No murmur heard.   No friction rub. No gallop.  Pulmonary:     Effort: Pulmonary effort is normal.     Breath sounds: No wheezing or rales.     Comments: Mild diminished breath sounds Abdominal:     General: There is no distension.     Palpations: Abdomen is soft.     Tenderness: There is no abdominal tenderness.  Musculoskeletal:        General: No tenderness.     Cervical back: Normal range of motion and neck supple.  Skin:    General: Skin is warm and dry.  Neurological:     Mental Status: She is alert and oriented to person, place, and time.  Psychiatric:        Behavior: Behavior normal.    ED Results / Procedures / Treatments   Labs (all labs ordered are listed, but only abnormal results are displayed) Labs Reviewed  CBC WITH DIFFERENTIAL/PLATELET - Abnormal; Notable for the following components:      Result Value   Hemoglobin 11.4 (*)    HCT 35.5 (*)    Lymphs Abs 0.5 (*)    All other components within normal limits  COMPREHENSIVE METABOLIC PANEL - Abnormal; Notable for the following components:   Sodium 130 (*)    Chloride 89 (*)    Creatinine, Ser <0.30 (*)    All other components within normal limits  BRAIN NATRIURETIC PEPTIDE - Abnormal; Notable for the following components:   B Natriuretic Peptide 112.8 (*)    All other components within normal limits  TROPONIN I (HIGH SENSITIVITY) - Abnormal; Notable for the following components:   Troponin I (High Sensitivity) 94 (*)    All other components within normal limits  TROPONIN I (HIGH SENSITIVITY) - Abnormal; Notable for the following components:   Troponin I (High Sensitivity) 96 (*)    All other components within normal limits  RESP PANEL BY RT-PCR (FLU  A&B, COVID) [id number]    EKG EKG Interpretation  Date/Time:  Tuesday March 08 2021 12:20:23 EDT Ventricular Rate:  85 PR Interval:  133 QRS Duration: 85 QT Interval:  356 QTC Calculation: 424 R Axis:   56 Text Interpretation: Sinus rhythm Left ventricular hypertrophy Anteroseptal infarct, old flipped t waves resolved since prior. Confirmed by Deno Etienne [phone]) on 03/08/2021 12:25:38 PM  Radiology DG Chest 2 View  Result Date: 03/08/2021 CLINICAL DATA:  Shortness of breath EXAM: CHEST - 2 VIEW COMPARISON:  01/28/2021 FINDINGS: Normal heart size. Atherosclerotic calcification of the aortic knob. Hyperexpanded, hyperlucent lungs. No focal consolidation. No pleural effusion or pneumothorax. Reverse left shoulder arthroplasty. Surgical clips project over the left chest wall. IMPRESSION: No acute cardiopulmonary disease. COPD. Electronically Signed   By: Davina Poke D.O.   On: 03/08/2021 13:30    Procedures Procedures   Medications Ordered in ED Medications  albuterol (VENTOLIN HFA) 108 (90 Base) MCG/ACT inhaler 6 puff (6 puffs Inhalation Given 03/08/21 1358)  aerochamber plus with mask device 1 each (1 each Other Given 03/08/21 1358)  ipratropium (ATROVENT HFA) inhaler 2 puff (2 puffs Inhalation Given 03/08/21 1356)  predniSONE (DELTASONE) tablet 60 mg (60 mg Oral Given 03/08/21 1353)    ED Course  I have reviewed the triage vital signs and the nursing notes.  Pertinent labs & imaging results that were available during my care of the patient were reviewed by me and considered in my medical decision  making (see chart for details).    MDM Rules/Calculators/A&P                           70 yo F with a cc of cough congestion.  Going on for a few days.  Most likely the patient has a URI based on her symptoms.  Most concerned about her posterior nasal drip and coughing at night.  Chest x-ray viewed by me without infiltrate.  Will await lab tests that were ordered in triage.  Patient  has a troponin that is elevated.  Not a test that was ordered without chest pain and symptoms seem more consistent with a URI.  Will obtain a delta.  Delta without change.  No significant anemia no significant electrolyte abnormality.  Patient feeling better after albuterol and ipratropium.  Will discharge home on burst of steroids.  Course of antibiotics.  3:52 PM:  I have discussed the diagnosis/risks/treatment options with the patient and believe the pt to be eligible for discharge home to follow-up with PCP. We also discussed returning to the ED immediately if new or worsening sx occur. We discussed the sx which are most concerning (e.g., sudden worsening pain, fever, inability to tolerate by mouth) that necessitate immediate return. Medications administered to the patient during their visit and any new prescriptions provided to the patient are listed below.  Medications given during this visit Medications  albuterol (VENTOLIN HFA) 108 (90 Base) MCG/ACT inhaler 6 puff (6 puffs Inhalation Given 03/08/21 1358)  aerochamber plus with mask device 1 each (1 each Other Given 03/08/21 1358)  ipratropium (ATROVENT HFA) inhaler 2 puff (2 puffs Inhalation Given 03/08/21 1356)  predniSONE (DELTASONE) tablet 60 mg (60 mg Oral Given 03/08/21 1353)     The patient appears reasonably screen and/or stabilized for discharge and I doubt any other medical condition or other [facility] requiring further screening, evaluation, or treatment in the ED at this time prior to discharge.   Final Clinical Impression(s) / ED Diagnoses Final diagnoses:  Viral upper respiratory tract infection    Rx / DC Orders ED Discharge Orders          Ordered    amoxicillin-clavulanate (AUGMENTIN) 875-125 MG tablet  2 times daily        03/08/21 1546    predniSONE (DELTASONE) 20 MG tablet        03/08/21 1546    benzonatate (TESSALON) 100 MG capsule  Every 8 hours        03/08/21 1546             Deno Etienne, DO 03/08/21  1552

## 2021-03-08 NOTE — Discharge Instructions (Signed)
Take tylenol 2 pills 4 times a day.  Drink plenty of fluids.  Return for worsening shortness of breath, headache, confusion. Follow up with your family doctor.     

## 2021-03-08 NOTE — ED Provider Notes (Signed)
Emergency Medicine Provider Triage Evaluation Note  Sherry Bender , a 70 y.o. female  was evaluated in triage.  Pt complains of shortness of breath since last night.  She states she woke up at 2am with coughing green phlegm and coughing.  And sweating.  She states that this feels similar to when she was admitted in June with pneumonia and COPD.   Review of Systems  Positive: Shortness of breath, cough Negative: fever  Physical Exam  BP (!) 128/53 (BP Location: Right Arm)   Pulse 91   Temp 98.1 F (36.7 C) (Oral)   Resp 16   LMP 02/28/1999   SpO2 100%  Gen:   Awake, no distress   Resp:  Normal effort Slight wheezes bilaterally.  MSK:   Moves extremities without difficulty  Other:  Speech is not slurred  Medical Decision Making  Medically screening exam initiated at 12:21 PM.  Appropriate orders placed.  Sherry Bender was informed that the remainder of the evaluation will be completed by another provider, this initial triage assessment does not replace that evaluation, and the importance of remaining in the ED until their evaluation is complete.  Note: Portions of this report may have been transcribed using voice recognition software. Every effort was made to ensure accuracy; however, inadvertent computerized transcription errors may be present    Sherry Bender 03/08/21 1226    Godfrey Pick, MD 03/08/21 [phone]

## 2021-03-08 NOTE — ED Notes (Signed)
Pt's medication given with applesauce due to hx of dysphagia

## 2021-03-11 ENCOUNTER — Telehealth: Payer: Self-pay | Admitting: *Deleted

## 2021-03-11 NOTE — Telephone Encounter (Signed)
Patient called nurse triage complaining of being nervous and shaky with some shortness of breath.  She is being treated for Upper respiratory infection with a steroid and antibiotic.   She feels the treatment is not helping.  I did advise the prednisone can make you feel nervous and shaky.  Patient says she does not like the way it makes her feel. She stated that she wants to stop....she paused...or cut back on dose.  She reiterated that she feels the antibiotic is not helping.  Patients PCP is Evelina Dun, NP

## 2021-03-11 NOTE — Telephone Encounter (Signed)
PT AWARE OF RECOMMEDATIONS

## 2021-03-11 NOTE — Telephone Encounter (Signed)
Have her continue with the antibiotic, have her cut the prednisone dose in half and see if that does better for her and makes her less shaky.  If she continues to worsen and does not feel like she is improving then she may need to be seen in urgent care or the emergency department over the weekend.  She can also use Mucinex and Flonase and nasal saline sprays

## 2021-03-22 ENCOUNTER — Ambulatory Visit (INDEPENDENT_AMBULATORY_CARE_PROVIDER_SITE_OTHER): Payer: 59 | Admitting: Family

## 2021-03-22 ENCOUNTER — Encounter: Payer: Self-pay | Admitting: Family

## 2021-03-22 ENCOUNTER — Telehealth: Payer: Self-pay | Admitting: Family

## 2021-03-22 DIAGNOSIS — M5442 Lumbago with sciatica, left side: Secondary | ICD-10-CM | POA: Diagnosis not present

## 2021-03-22 MED ORDER — DICLOFENAC SODIUM 75 MG PO TBEC: 75.0000 mg | DELAYED_RELEASE_TABLET | Freq: Two times a day (BID) | ORAL | 0 refills | Status: DC

## 2021-03-22 MED ORDER — PREDNISONE 10 MG (21) PO TBPK: ORAL_TABLET | ORAL | 0 refills | Status: DC

## 2021-03-22 MED ORDER — BACLOFEN 10 MG PO TABS: 10.0000 mg | ORAL_TABLET | Freq: Three times a day (TID) | ORAL | 0 refills | Status: DC | PRN

## 2021-03-22 NOTE — Patient Instructions (Signed)
Sciatica Sciatica is pain, numbness, weakness, or tingling along the path of the sciatic nerve. The sciatic nerve starts in the lower back and runs down the back of each leg. The nerve controls the muscles in the lower leg and in the back of the knee. It also provides feeling (sensation) to the back of the thigh, the lower leg, and the sole of the foot. Sciatica is a symptom of another medical condition that pinches or puts pressure on the sciatic nerve. Sciatica most often only affects one side of the body. Sciatica usually goes away on its own or with treatment. In some cases, sciatica may come back (recur). What are the causes? This condition is caused by pressure on the sciatic nerve or pinching of the nerve. This may be the result of: A disk in between the bones of the spine bulging out too far (herniated disk). Age-related changes in the spinal disks. A pain disorder that affects a muscle in the buttock. Extra bone growth near the sciatic nerve. A break (fracture) of the pelvis. Pregnancy. Tumor. This is rare. What increases the risk? The following factors may make you more likely to develop this condition: Playing sports that place pressure or stress on the spine. Having poor strength and flexibility. A history of back injury or surgery. Sitting for long periods of time. Doing activities that involve repetitive bending or lifting. Obesity. What are the signs or symptoms? Symptoms can vary from mild to very severe, and they may include: Any of these problems in the lower back, leg, hip, or buttock: Mild tingling, numbness, or dull aches. Burning sensations. Sharp pains. Numbness in the back of the calf or the sole of the foot. Leg weakness. Severe back pain that makes movement difficult. Symptoms may get worse when you cough, sneeze, or laugh, or when you sit or stand for long periods of time. How is this diagnosed? This condition may be diagnosed based on: Your symptoms and  medical history. A physical exam. Blood tests. Imaging tests, such as: X-rays. MRI. CT scan. How is this treated? In many cases, this condition improves on its own without treatment. However, treatment may include: Reducing or modifying physical activity. Exercising and stretching. Icing and applying heat to the affected area. Medicines that help to: Relieve pain and swelling. Relax your muscles. Injections of medicines that help to relieve pain, irritation, and inflammation around the sciatic nerve (steroids). Surgery. Follow these instructions at home: Medicines Take over-the-counter and prescription medicines only as told by your health care provider. Ask your health care provider if the medicine prescribed to you: Requires you to avoid driving or using heavy machinery. Can cause constipation. You may need to take these actions to prevent or treat constipation: Drink enough fluid to keep your urine pale yellow. Take over-the-counter or prescription medicines. Eat foods that are high in fiber, such as beans, whole grains, and fresh fruits and vegetables. Limit foods that are high in fat and processed sugars, such as fried or sweet foods. Managing pain   If directed, put ice on the affected area. Put ice in a plastic bag. Place a towel between your skin and the bag. Leave the ice on for 20 minutes, 2-3 times a day. If directed, apply heat to the affected area. Use the heat source that your health care provider recommends, such as a moist heat pack or a heating pad. Place a towel between your skin and the heat source. Leave the heat on for 20-30 minutes. Remove   the heat if your skin turns bright red. This is especially important if you are unable to feel pain, heat, or cold. You may have a greater risk of getting burned. Activity  Return to your normal activities as told by your health care provider. Ask your health care provider what activities are safe for you. Avoid  activities that make your symptoms worse. Take brief periods of rest throughout the day. When you rest for longer periods, mix in some mild activity or stretching between periods of rest. This will help to prevent stiffness and pain. Avoid sitting for long periods of time without moving. Get up and move around at least one time each hour. Exercise and stretch regularly, as told by your health care provider. Do not lift anything that is heavier than 10 lb (4.5 kg) while you have symptoms of sciatica. When you do not have symptoms, you should still avoid heavy lifting, especially repetitive heavy lifting. When you lift objects, always use proper lifting technique, which includes: Bending your knees. Keeping the load close to your body. Avoiding twisting. General instructions Maintain a healthy weight. Excess weight puts extra stress on your back. Wear supportive, comfortable shoes. Avoid wearing high heels. Avoid sleeping on a mattress that is too soft or too hard. A mattress that is firm enough to support your back when you sleep may help to reduce your pain. Keep all follow-up visits as told by your health care provider. This is important. Contact a health care provider if: You have pain that: Wakes you up when you are sleeping. Gets worse when you lie down. Is worse than you have experienced in the past. Lasts longer than 4 weeks. You have an unexplained weight loss. Get help right away if: You are not able to control when you urinate or have bowel movements (incontinence). You have: Weakness in your lower back, pelvis, buttocks, or legs that gets worse. Redness or swelling of your back. A burning sensation when you urinate. Summary Sciatica is pain, numbness, weakness, or tingling along the path of the sciatic nerve. This condition is caused by pressure on the sciatic nerve or pinching of the nerve. Sciatica can cause pain, numbness, or tingling in the lower back, legs, hips, and  buttocks. Treatment often includes rest, exercise, medicines, and applying ice or heat. This information is not intended to replace advice given to you by your health care provider. Make sure you discuss any questions you have with your health care provider. Document Revised: 06/24/2018 Document Reviewed: 06/24/2018 Elsevier Patient Education  2022 [employer] Inc.  

## 2021-03-22 NOTE — Telephone Encounter (Signed)
Appt made

## 2021-03-22 NOTE — Progress Notes (Signed)
Virtual Visit  Note Due to COVID-19 pandemic this visit was conducted virtually. This visit type was conducted due to national recommendations for restrictions regarding the COVID-19 Pandemic (e.g. social distancing, sheltering in place) in an effort to limit this patient's exposure and mitigate transmission in our community. All issues noted in this document were discussed and addressed.  A physical exam was not performed with this format.  I connected with Sherry Bender on 03/22/21 at 11:10 AM  by telephone and verified that I am speaking with the correct person using two identifiers. Sherry Bender is currently located at home and no one is currently with her during visit. The provider, Evelina Dun, FNP is located in their office at time of visit.  I discussed the limitations, risks, security and privacy concerns of performing an evaluation and management service by telephone and the availability of in person appointments. I also discussed with the patient that there may be a patient responsible charge related to this service. The patient expressed understanding and agreed to proceed.  [name], haggard are scheduled for a virtual visit with your provider today.    Just as we do with appointments in the office, we must obtain your consent to participate.  Your consent will be active for this visit and any virtual visit you may have with one of our providers in the next 365 days.    If you have a MyChart account, I can also send a copy of this consent to you electronically.  All virtual visits are billed to your insurance company just like a traditional visit in the office.  As this is a virtual visit, video technology does not allow for your provider to perform a traditional examination.  This may limit your provider's ability to fully assess your condition.  If your provider identifies any concerns that need to be evaluated in person or the need to arrange testing such as labs, EKG, etc, we will  make arrangements to do so.    Although advances in technology are sophisticated, we cannot ensure that it will always work on either your end or our end.  If the connection with a video visit is poor, we may have to switch to a telephone visit.  With either a video or telephone visit, we are not always able to ensure that we have a secure connection.   I need to obtain your verbal consent now.   Are you willing to proceed with your visit today?   Shanay Bender has provided verbal consent on 03/22/2021 for a virtual visit (video or telephone).   Evelina Dun, [place] 03/22/2021  11:13 AM    History and Present Illness:  Back Pain This is a new problem. The current episode started yesterday. The problem occurs constantly. The problem has been gradually worsening since onset. The pain is present in the lumbar spine. The quality of the pain is described as aching. The pain radiates to the left thigh. The pain is at a severity of 10/10. The pain is moderate. The symptoms are aggravated by lying down. Associated symptoms include leg pain. Pertinent negatives include no tingling. She has tried NSAIDs for the symptoms. The treatment provided mild relief.     Review of Systems  Musculoskeletal:  Positive for back pain.  Neurological:  Negative for tingling.    Observations/Objective: No SOB or distress noted   Assessment and Plan: 1. Acute left-sided low back pain with left-sided sciatica Rest Ice  ROM exercises  No other NSIAD's  while taking taking diclofenac Sedation precautions discussed  RTO if symptoms worsen or do not improve  - diclofenac (VOLTAREN) 75 MG EC tablet; Take 1 tablet (75 mg total) by mouth 2 (two) times daily.  Dispense: 30 tablet; Refill: 0 - baclofen (LIORESAL) 10 MG tablet; Take 1 tablet (10 mg total) by mouth 3 (three) times daily as needed for muscle spasms.  Dispense: 30 each; Refill: 0 - predniSONE (STERAPRED UNI-PAK 21 TAB) 10 MG (21) TBPK tablet; Use as directed   Dispense: 21 tablet; Refill: 0    I discussed the assessment and treatment plan with the patient. The patient was provided an opportunity to ask questions and all were answered. The patient agreed with the plan and demonstrated an understanding of the instructions.   The patient was advised to call back or seek an in-person evaluation if the symptoms worsen or if the condition fails to improve as anticipated.  The above assessment and management plan was discussed with the patient. The patient verbalized understanding of and has agreed to the management plan. Patient is aware to call the clinic if symptoms persist or worsen. Patient is aware when to return to the clinic for a follow-up visit. Patient educated on when it is appropriate to go to the emergency department.   Time call ended:  11:21 AM   I provided 11 minutes of  non face-to-face time during this encounter.    Evelina Dun, FNP

## 2021-03-22 NOTE — Telephone Encounter (Signed)
Pt wants to talk to nurse because she pulled a muscle in her hip and is having a spasm down her leg. She refused an appt because she said she is unable to walk so she would not be able to come in. Please call back and advise.

## 2021-03-24 ENCOUNTER — Telehealth: Payer: Self-pay | Admitting: Family

## 2021-03-24 MED ORDER — METAXALONE 800 MG PO TABS: 800.0000 mg | ORAL_TABLET | Freq: Three times a day (TID) | ORAL | 0 refills | Status: DC | PRN

## 2021-03-24 NOTE — Telephone Encounter (Signed)
Skelaxin Prescription sent to pharmacy. Pt can not take this and baclofen together.

## 2021-03-24 NOTE — Telephone Encounter (Signed)
Patient aware and verbalized understanding. °

## 2021-03-28 ENCOUNTER — Telehealth: Payer: Self-pay | Admitting: Family

## 2021-03-28 NOTE — Telephone Encounter (Signed)
Called and spoke with patient she went to [facility] for her back pain. They gave her lidocaine patches but she states she needs something stronger for her pain. She can not even walk and her husband is going to not be out home and she does not want to just not be able to walk when he is not there. Patient uses [facility] in [facility] advise.

## 2021-03-29 ENCOUNTER — Telehealth: Payer: Self-pay | Admitting: Family

## 2021-03-29 ENCOUNTER — Ambulatory Visit (INDEPENDENT_AMBULATORY_CARE_PROVIDER_SITE_OTHER): Payer: 59 | Admitting: Family

## 2021-03-29 ENCOUNTER — Encounter: Payer: Self-pay | Admitting: Family

## 2021-03-29 DIAGNOSIS — Z09 Encounter for follow-up examination after completed treatment for conditions other than malignant neoplasm: Secondary | ICD-10-CM

## 2021-03-29 DIAGNOSIS — M159 Polyosteoarthritis, unspecified: Secondary | ICD-10-CM

## 2021-03-29 DIAGNOSIS — M5442 Lumbago with sciatica, left side: Secondary | ICD-10-CM | POA: Diagnosis not present

## 2021-03-29 DIAGNOSIS — I2510 Atherosclerotic heart disease of native coronary artery without angina pectoris: Secondary | ICD-10-CM

## 2021-03-29 MED ORDER — LIDOCAINE 5 % EX PTCH: 1.0000 | MEDICATED_PATCH | CUTANEOUS | 0 refills | Status: DC

## 2021-03-29 MED ORDER — TRAMADOL HCL 50 MG PO TABS: 25.0000 mg | ORAL_TABLET | Freq: Three times a day (TID) | ORAL | 0 refills | Status: AC | PRN

## 2021-03-29 MED ORDER — GABAPENTIN 100 MG PO CAPS: 100.0000 mg | ORAL_CAPSULE | Freq: Three times a day (TID) | ORAL | 0 refills | Status: DC

## 2021-03-29 NOTE — Telephone Encounter (Signed)
Telephone visit scheduled today with [facility]. Patient notified

## 2021-03-29 NOTE — Telephone Encounter (Signed)
[facility] - PA Case ID: [id number] - Rx #: [id number] Need help? Call us at [phone] Status Sent to [facility] Lidocaine 5% patches

## 2021-03-29 NOTE — Telephone Encounter (Signed)
Please schedule her a telephone call with me.   Evelina Dun, FNP

## 2021-03-29 NOTE — Progress Notes (Signed)
Virtual Visit  Note Due to COVID-19 pandemic this visit was conducted virtually. This visit type was conducted due to national recommendations for restrictions regarding the COVID-19 Pandemic (e.g. social distancing, sheltering in place) in an effort to limit this patient's exposure and mitigate transmission in our community. All issues noted in this document were discussed and addressed.  A physical exam was not performed with this format.  I connected with Sherry Bender on 03/29/21 at 8:58 AM  by telephone and verified that I am speaking with the correct person using two identifiers. Sherry Bender is currently located at home and no one is currently with her during visit. The provider, Evelina Dun, FNP is located in their office at time of visit.  I discussed the limitations, risks, security and privacy concerns of performing an evaluation and management service by telephone and the availability of in person appointments. I also discussed with the patient that there may be a patient responsible charge related to this service. The patient expressed understanding and agreed to proceed.  Sherry Bender, Sherry Bender are scheduled for a virtual visit with your provider today.    Just as we do with appointments in the office, we must obtain your consent to participate.  Your consent will be active for this visit and any virtual visit you may have with one of our providers in the next 365 days.    If you have a MyChart account, I can also send a copy of this consent to you electronically.  All virtual visits are billed to your insurance company just like a traditional visit in the office.  As this is a virtual visit, video technology does not allow for your provider to perform a traditional examination.  This may limit your provider's ability to fully assess your condition.  If your provider identifies any concerns that need to be evaluated in person or the need to arrange testing such as labs, EKG, etc, we will make  arrangements to do so.    Although advances in technology are sophisticated, we cannot ensure that it will always work on either your end or our end.  If the connection with a video visit is poor, we may have to switch to a telephone visit.  With either a video or telephone visit, we are not always able to ensure that we have a secure connection.   I need to obtain your verbal consent now.   Are you willing to proceed with your visit today?   Sherry Bender has provided verbal consent on 03/29/2021 for a virtual visit (video or telephone).   Evelina Dun, [place] 03/29/2021  9:00 AM   History and Present Illness:  Back Pain This is a recurrent problem. The current episode started more than 1 month ago. The problem has been waxing and waning since onset. The pain is present in the lumbar spine. The quality of the pain is described as aching. The pain radiates to the left thigh. The pain is at a severity of 10/10. The pain is moderate. The symptoms are aggravated by lying down, standing and twisting. Associated symptoms include leg pain, tingling and weakness. Risk factors include obesity and menopause. She has tried NSAIDs (tylenol, lidocaine patch) for the symptoms. The treatment provided mild relief.   Pt calls the office today today with worsening sciatica pain. She went to the ED on 03/27/21. She had a CT scan that showed, "Unchanged mild chronic anterior wedging of the L2 and L3 vertebral  bodies.   No  evidence of acute fracture to the lumbar spine. No focal  suspicious osseous lesion.   Lumbar spondylosis, as described. No more than mild appreciable  central canal stenosis. Multifactorial right subarticular narrowing  at L3-L4 and L4-L5. Multilevel foraminal stenosis, greatest on the  left at L4-L5 (moderate at this site).   Reversal of the expected lordosis.   Mild L3-L4 right lateral listhesis.   Multilevel grade 1 spondylolisthesis.   Please refer to the concurrently performed CT  abdomen/pelvis for  description of abdominopelvic soft tissue findings."   She was discharged with lidocaine patches that mild helped.   Review of Systems  Musculoskeletal:  Positive for back pain.  Neurological:  Positive for tingling and weakness.    Observations/Objective: No SOB or distress noted   Assessment and Plan: 1. Acute left-sided low back pain with left-sided sciatica  - gabapentin (NEURONTIN) 100 MG capsule; Take 1 capsule (100 mg total) by mouth 3 (three) times daily.  Dispense: 90 capsule; Refill: 0 - traMADol (ULTRAM) 50 MG tablet; Take 0.5-1 tablets (25-50 mg total) by mouth every 8 (eight) hours as needed for up to 5 days.  Dispense: 15 tablet; Refill: 0 - lidocaine (LIDODERM) 5 %; Place 1 patch onto the skin daily. Remove & Discard patch within 12 hours or as directed by MD  Dispense: 30 patch; Refill: 0  2. Primary osteoarthritis involving multiple joints  - lidocaine (LIDODERM) 5 %; Place 1 patch onto the skin daily. Remove & Discard patch within 12 hours or as directed by MD  Dispense: 30 patch; Refill: 0  3. Hospital discharge follow-up   Rest ROM exercises encouraged Sedation precautions  Gabapentin as needed, sedation precautions discussed Pt will cut Ultram in half PT reviewed in [place] controlled database    I discussed the assessment and treatment plan with the patient. The patient was provided an opportunity to ask questions and all were answered. The patient agreed with the plan and demonstrated an understanding of the instructions.   The patient was advised to call back or seek an in-person evaluation if the symptoms worsen or if the condition fails to improve as anticipated.  The above assessment and management plan was discussed with the patient. The patient verbalized understanding of and has agreed to the management plan. Patient is aware to call the clinic if symptoms persist or worsen. Patient is aware when to return to the clinic for a  follow-up visit. Patient educated on when it is appropriate to go to the emergency department.   Time call ended:  9:20 AM   I provided 22 minutes of  non face-to-face time during this encounter.    Evelina Dun, FNP

## 2021-03-30 NOTE — Telephone Encounter (Signed)
Deniedon October 11 Your PA request has been denied. Additional information will be provided in the denial communication. (Message [phone])

## 2021-03-31 NOTE — Telephone Encounter (Signed)
Patient aware and verbalized understanding. Patient went to orthopedics 03/30/21.

## 2021-03-31 NOTE — Telephone Encounter (Signed)
Please let the patient know her rx was denied from her insurance.

## 2021-04-01 ENCOUNTER — Encounter (HOSPITAL_COMMUNITY): Payer: Self-pay

## 2021-04-01 ENCOUNTER — Telehealth: Payer: Self-pay | Admitting: *Deleted

## 2021-04-01 ENCOUNTER — Other Ambulatory Visit: Payer: Self-pay

## 2021-04-01 ENCOUNTER — Emergency Department (HOSPITAL_COMMUNITY): Payer: 59

## 2021-04-01 ENCOUNTER — Emergency Department (HOSPITAL_COMMUNITY)
Admission: EM | Admit: 2021-04-01 | Discharge: 2021-04-02 | Disposition: A | Discharge status: Home / Self Care | Payer: 59 | Attending: Emergency Medicine | Admitting: Emergency Medicine

## 2021-04-01 DIAGNOSIS — R2243 Localized swelling, mass and lump, lower limb, bilateral: Secondary | ICD-10-CM | POA: Diagnosis not present

## 2021-04-01 DIAGNOSIS — Z85828 Personal history of other malignant neoplasm of skin: Secondary | ICD-10-CM | POA: Diagnosis not present

## 2021-04-01 DIAGNOSIS — I5021 Acute systolic (congestive) heart failure: Secondary | ICD-10-CM | POA: Diagnosis not present

## 2021-04-01 DIAGNOSIS — R000 Tachycardia, unspecified: Secondary | ICD-10-CM | POA: Diagnosis not present

## 2021-04-01 DIAGNOSIS — R531 Weakness: Secondary | ICD-10-CM | POA: Diagnosis not present

## 2021-04-01 DIAGNOSIS — M545 Low back pain, unspecified: Secondary | ICD-10-CM | POA: Insufficient documentation

## 2021-04-01 DIAGNOSIS — M79604 Pain in right leg: Secondary | ICD-10-CM | POA: Insufficient documentation

## 2021-04-01 DIAGNOSIS — Z87891 Personal history of nicotine dependence: Secondary | ICD-10-CM | POA: Diagnosis not present

## 2021-04-01 DIAGNOSIS — Z853 Personal history of malignant neoplasm of breast: Secondary | ICD-10-CM | POA: Insufficient documentation

## 2021-04-01 DIAGNOSIS — Z9012 Acquired absence of left breast and nipple: Secondary | ICD-10-CM | POA: Diagnosis not present

## 2021-04-01 DIAGNOSIS — I110 Hypertensive heart disease with heart failure: Secondary | ICD-10-CM | POA: Insufficient documentation

## 2021-04-01 DIAGNOSIS — Z7982 Long term (current) use of aspirin: Secondary | ICD-10-CM | POA: Diagnosis not present

## 2021-04-01 DIAGNOSIS — Z79899 Other long term (current) drug therapy: Secondary | ICD-10-CM | POA: Diagnosis not present

## 2021-04-01 LAB — CBC WITH DIFFERENTIAL/PLATELET
Abs Immature Granulocytes: 0.02 10*3/uL (ref 0.00–0.07)
Basophils Absolute: 0 10*3/uL (ref 0.0–0.1)
Basophils Relative: 0 %
Eosinophils Absolute: 0.2 10*3/uL (ref 0.0–0.5)
Eosinophils Relative: 5 %
HCT: 34 % — ABNORMAL LOW (ref 36.0–46.0)
Hemoglobin: 10.9 g/dL — ABNORMAL LOW (ref 12.0–15.0)
Immature Granulocytes: 0 %
Lymphocytes Relative: 13 %
Lymphs Abs: 0.6 10*3/uL — ABNORMAL LOW (ref 0.7–4.0)
MCH: 27.9 pg (ref 26.0–34.0)
MCHC: 32.1 g/dL (ref 30.0–36.0)
MCV: 87 fL (ref 80.0–100.0)
Monocytes Absolute: 0.4 10*3/uL (ref 0.1–1.0)
Monocytes Relative: 9 %
Neutro Abs: 3.4 10*3/uL (ref 1.7–7.7)
Neutrophils Relative %: 73 %
Platelets: 215 10*3/uL (ref 150–400)
RBC: 3.91 MIL/uL (ref 3.87–5.11)
RDW: 14.4 % (ref 11.5–15.5)
WBC: 4.7 10*3/uL (ref 4.0–10.5)
nRBC: 0 % (ref 0.0–0.2)

## 2021-04-01 LAB — COMPREHENSIVE METABOLIC PANEL
ALT: 12 U/L (ref 0–44)
AST: 17 U/L (ref 15–41)
Albumin: 3.4 g/dL — ABNORMAL LOW (ref 3.5–5.0)
Alkaline Phosphatase: 83 U/L (ref 38–126)
Anion gap: 8 (ref 5–15)
BUN: 9 mg/dL (ref 8–23)
CO2: 31 mmol/L (ref 22–32)
Calcium: 8.9 mg/dL (ref 8.9–10.3)
Chloride: 91 mmol/L — ABNORMAL LOW (ref 98–111)
Creatinine, Ser: <0.3 mg/dL — ABNORMAL LOW (ref 0.44–1.00)
Glucose, Bld: 81 mg/dL (ref 70–99)
Potassium: 3.6 mmol/L (ref 3.5–5.1)
Sodium: 130 mmol/L — ABNORMAL LOW (ref 135–145)
Total Bilirubin: 0.4 mg/dL (ref 0.3–1.2)
Total Protein: 6.8 g/dL (ref 6.5–8.1)

## 2021-04-01 LAB — BRAIN NATRIURETIC PEPTIDE: B Natriuretic Peptide: 58.5 pg/mL (ref 0.0–100.0)

## 2021-04-01 MED ORDER — IBUPROFEN 800 MG PO TABS
800.0000 mg | ORAL_TABLET | Freq: Once | ORAL | Status: AC
  Administered 2021-04-01: 800 mg via ORAL
  Filled 2021-04-01: qty 1

## 2021-04-01 MED ORDER — SODIUM CHLORIDE 0.9 % IV BOLUS
1000.0000 mL | Freq: Once | INTRAVENOUS | Status: AC
  Administered 2021-04-01: 1000 mL via INTRAVENOUS

## 2021-04-01 MED ORDER — MORPHINE SULFATE (PF) 4 MG/ML IV SOLN
4.0000 mg | Freq: Once | INTRAVENOUS | Status: AC
  Administered 2021-04-01: 4 mg via INTRAVENOUS
  Filled 2021-04-01: qty 1

## 2021-04-01 MED ORDER — LORAZEPAM 2 MG/ML IJ SOLN
1.0000 mg | Freq: Once | INTRAMUSCULAR | Status: AC
  Administered 2021-04-01: 1 mg via INTRAVENOUS
  Filled 2021-04-01: qty 1

## 2021-04-01 MED ORDER — MORPHINE SULFATE (PF) 2 MG/ML IV SOLN
2.0000 mg | Freq: Once | INTRAVENOUS | Status: DC
Start: 2021-04-01 — End: 2021-04-01

## 2021-04-01 MED ORDER — ONDANSETRON HCL 4 MG/2ML IJ SOLN
4.0000 mg | Freq: Once | INTRAMUSCULAR | Status: AC
  Administered 2021-04-01: 4 mg via INTRAVENOUS
  Filled 2021-04-01: qty 2

## 2021-04-01 MED ORDER — SODIUM CHLORIDE 0.9 % IV BOLUS: 1000.0000 mL | Freq: Once | INTRAVENOUS | Status: DC

## 2021-04-01 NOTE — ED Provider Notes (Addendum)
Emergency Medicine Provider Triage Evaluation Note  Sherry Bender , a 70 y.o. female  was evaluated in triage.  Pt complains of lumbar back pain that has been going on for 2 weeks.  Her doctor sent her to the emergency department to get an MRI because his wait time is 2 weeks long until he can see her.  History of breast cancer, last chemo 2017.  Review of Systems  Positive: Back pain, left buttocks pain and left thigh pain Negative: Fevers, chills, falls  Physical Exam  BP (!) 118/101 (BP Location: Left Arm)   Pulse (!) 106   Temp 98 F (36.7 C) (Oral)   Resp 18   Ht 5\' 4"  (1.626 m)   Wt 39.9 kg   LMP 02/28/1999   SpO2 100%   BMI 15.11 kg/m  Gen:   Awake, no distress   Resp:  Normal effort  MSK:   Moves extremities without difficulty  Other:  TTP over paraspinal muscles of lumbar spine at the level of L4-L5  Medical Decision Making  Medically screening exam initiated at 2:45 PM.  Appropriate orders placed.  Adwoa Axe was informed that the remainder of the evaluation will be completed by another provider, this initial triage assessment does not replace that evaluation, and the importance of remaining in the ED until their evaluation is complete.  Patient is requesting an MRI today.  She says that her oncologist told her she needed to come get 1.  Per chart review patient has not seen oncology in 5+ months. She can be evaluated more thoroughly after MSE and may get an MRI according to provider discretion.  Patient and husband continue to come up to front desk reporting that her pain is worse and demanding an MRI.  Appears stable, just in pain.  Ibuprofen ordered.   Darliss Ridgel 04/01/21 1446    Malvin Johns, MD 04/01/21 Bono, Auberry A, PA-C 04/01/21 1821    Rhae Hammock, PA-C 04/01/21 1843    Malvin Johns, MD 04/04/21 1500

## 2021-04-01 NOTE — ED Triage Notes (Addendum)
Pt states that she is a CA pt. Pt states she is having back pain that radiates down both legs x 2 weeks. Pt states that she has been having swelling in feet/ankles. Pt states she cannot walk like normal. Cancer Dr told her to come here for MRI.  Upon review of the pt's chart, last chemo in 2017.

## 2021-04-01 NOTE — Telephone Encounter (Signed)
Received call from pt with complaint of severe back and hip pain causing difficulty with walking.  Pt states she is currently being worked up by her Orthopedist who has ordered an MRI for further evaluation of possible sciatica.  Pt states pain is excruciating and is not able to wait 2 weeks for MRI apt and Orthopedist office is closed today.  Pt requesting advice from MD.  Per MD pt to go to the emergency room for further evaluation and treatment in hopes that MRI can be preformed while hospitalized.  Pt verbalized understanding and appreciative of advice.

## 2021-04-01 NOTE — ED Notes (Signed)
Pt. In MRI.

## 2021-04-01 NOTE — ED Provider Notes (Signed)
[facility] DEPT Provider Note   CSN: [id number] Arrival date & time: 04/01/21  1402     History Chief Complaint  Patient presents with   Back Pain    Sherry Bender is a 70 y.o. female with a history of hypertension, breast cancer status post chemotherapy/radiation/mastectomy 2017, anterior cervical discectomy/decompression, anterior cervical fusion, cardiomyopathy.  Presents to the emergency department today with chief complaint of lumbar back pain and lower leg swelling.  Patient reports that her lumbar back pain started on 10/4.  Pain has been constant since then.  Pain has gotten progressively worse over time.  Pain radiates down left leg.  Patient rates pain 10/10 on the pain scale.  Pain is worse with movement or touch.  No relief of symptoms with tramadol or gabapentin.   Patient reports that pain is caused difficulty with ambulation.  Patient denies any bowel or bladder dysfunction, numbness, saddle anesthesia, fevers, chills.  Patient has weakness to left upper extremity which is baseline after previous surgery.  Additionally patient complains of swelling to bilateral lower extremities.  Swelling is worse in right lower extremity.  Swelling has been present over the last few days.  Patient denies any chest pain, shortness of breath.  Patient reports that she does not take any diuretics.  Has not noticed any weight change.   Back Pain Associated symptoms: weakness (baseline left upper extremity)   Associated symptoms: no abdominal pain, no chest pain, no dysuria, no fever, no headaches and no numbness       Past Medical History:  Diagnosis Date   Anxiety    Arthritis    Breast cancer ([place]) 2016   Cardiomyopathy ([place])    a. EF 25-30% by echocardiogram in 02/2019 b. 30-35% in 05/2019 c. normalized to 55-60% by echo in 02/2020   Coronary atherosclerosis    Cardiac CTA with negative FFR, nonobstructive March 2021   Cricopharyngeus muscle  dysfunction    Gastric ulcer    GERD (gastroesophageal reflux disease)    Hiatal hernia    Hyperlipidemia    Hypertension    PONV (postoperative nausea and vomiting)    Scoliosis    Scoliosis    Skin cancer    Urinary frequency     Patient Active Problem List   Diagnosis Date Noted   Moderate protein-calorie malnutrition ([place]) 01/28/2021   Cough in adult 11/28/2020   Acute respiratory failure with hypoxia ([place]) 11/28/2020   Hyponatremia 11/28/2020   Lower extremity edema 11/28/2020   Closed fracture of left proximal humerus 07/15/2020   Acute gastric ulcer without hemorrhage or perforation    Gastritis and gastroduodenitis    Acute systolic heart failure ([facility])    Acute respiratory failure ([place]) 02/18/2019   Impingement syndrome of left shoulder 01/02/2019   Closed fracture of head of left humerus 06/01/2017   Underweight 12/25/2016   Loss of weight 07/03/2016   History of esophageal stricture 07/03/2016   Dysphagia 06/08/2016   Hypersensitivity reaction 12/02/2015   Iron deficiency anemia 11/24/2015   Breast cancer of upper-outer quadrant of left female breast ([place]) 11/11/2014   S/P cervical spinal fusion 06/24/2014   HTN (hypertension) 12/06/2010   Hyperlipidemia 09/26/2010   Nodular goiter 09/26/2010   GERD (gastroesophageal reflux disease) 09/26/2010   Degenerative disc disease 09/26/2010   Allergic rhinitis 09/26/2010   Anxiety disorder 09/26/2010   Cyst, breast 09/26/2010    Past Surgical History:  Procedure Laterality Date   ABDOMINAL HYSTERECTOMY  2000   TAH/BSO  ANTERIOR CERVICAL DECOMP/DISCECTOMY FUSION N/A 06/18/2014   Procedure: C4-5 C5-6 C6-7 ANTERIOR CERVICAL DECOMPRESSION/DISKECTOMY/FUSION;  Surgeon: Eustace Moore, MD;  Location: [facility];  Service: Neurosurgery;  Laterality: N/A;  C4-5 C5-6 C6-7 ANTERIOR CERVICAL DECOMPRESSION/DISKECTOMY/FUSION   ANTERIOR CERVICAL DECOMP/DISCECTOMY FUSION N/A 06/25/2014   Procedure: EXPLORATION OF CERVICAL FUSION  WITH REVIOSN OF HARDWARE;  Surgeon: Eustace Moore, MD;  Location: [facility];  Service: Neurosurgery;  Laterality: N/A;   BIOPSY  01/01/2020   Procedure: BIOPSY;  Surgeon: Mauri Pole, MD;  Location: [facility];  Service: Endoscopy;;   CERVICAL FUSION     CHOLECYSTECTOMY  1994   COMPLETE MASTECTOMY W/ SENTINEL NODE BIOPSY Left 04/30/2015   ESOPHAGOGASTRODUODENOSCOPY (EGD) WITH PROPOFOL N/A 01/01/2020   Procedure: ESOPHAGOGASTRODUODENOSCOPY (EGD) WITH PROPOFOL;  Surgeon: Mauri Pole, MD;  Location: [facility];  Service: Endoscopy;  Laterality: N/A;   FOOT NEUROMA SURGERY  01/2010   also had metatarasl shortened   KNEE ARTHROSCOPY  2005   right knee   MASTECTOMY Left 2016   PORT-A-CATH REMOVAL Right 04/30/2015   Procedure: REMOVAL PORT-A-CATH;  Surgeon: Fanny Skates, MD;  Location: [facility];  Service: General;  Laterality: Right;   PORTACATH PLACEMENT N/A 11/23/2014   Procedure: INSERTION PORT-A-CATH WITH ULTRASOUND;  Surgeon: Fanny Skates, MD;  Location: [facility];  Service: General;  Laterality: N/A;   REVERSE SHOULDER ARTHROPLASTY Left 07/15/2020   Procedure: REVERSE SHOULDER ARTHROPLASTY;  Surgeon: Mordecai Rasmussen, MD;  Location: [facility];  Service: Orthopedics;  Laterality: Left;   RHINOPLASTY     SIMPLE MASTECTOMY WITH AXILLARY SENTINEL NODE BIOPSY Left 04/30/2015   Procedure: LEFT TOTAL  MASTECTOMY WITH LEFT AXILLARY SENTINEL NODE BIOPSY;  Surgeon: Fanny Skates, MD;  Location: [facility];  Service: General;  Laterality: Left;     OB History   No obstetric history on file.     Family History  Problem Relation Age of Onset   Heart disease Mother    Hypertension Mother    Diabetes Mother    Heart disease Father    Hypertension Father    Cancer Maternal Grandmother        ? ovarian cancer vs. cervical   Cancer Paternal Aunt        ? breast    Social History   Tobacco Use   Smoking status: Former    Packs/day: 0.25    Years: 42.00    Pack  years: 10.50    Types: Cigarettes    Quit date: 01/24/2017    Years since quitting: 4.1   Smokeless tobacco: Never  Vaping Use   Vaping Use: Never used  Substance Use Topics   Alcohol use: No   Drug use: No    Home Medications Prior to Admission medications   Medication Sig Start Date End Date Taking? Authorizing Provider  albuterol (PROVENTIL) (2.5 MG/3ML) 0.083% nebulizer solution Take 3 mLs (2.5 mg total) by nebulization every 6 (six) hours as needed for wheezing or shortness of breath. 11/02/20   Sharion Balloon, FNP  albuterol (VENTOLIN HFA) 108 (90 Base) MCG/ACT inhaler Inhale 2 puffs into the lungs every 6 (six) hours as needed for wheezing or shortness of breath.    [provider]  aspirin EC 81 MG tablet Take 1 tablet (81 mg total) by mouth daily. Swallow whole. 10/08/20   Strader, Fransisco Hertz, PA-C  atorvastatin (LIPITOR) 20 MG tablet TAKE 1 TABLET BY MOUTH EVERY DAY Patient taking differently: Take 20 mg by mouth  daily. 01/31/21   Satira Sark, MD  carvedilol (COREG) 3.125 MG tablet Take 0.5 tablets (1.55 mg total) by mouth in the morning and at bedtime. 11/09/20   Satira Sark, MD  diclofenac (VOLTAREN) 75 MG EC tablet Take 1 tablet (75 mg total) by mouth 2 (two) times daily. 03/22/21   Evelina Dun A, FNP  diphenhydrAMINE (BENADRYL) 25 MG tablet Take 25 mg by mouth at bedtime.    [provider]  ENTRESTO 24-26 MG TAKE 1 TABLET BY MOUTH 2 (TWO) TIMES DAILY. DUE FOR FOLLOW UP APPT Patient taking differently: Take 1 tablet by mouth 2 (two) times daily. 05/31/20   Arnoldo Lenis, MD  esomeprazole (NEXIUM) 40 MG capsule Take 1 capsule (40 mg total) by mouth daily. 05/18/20 05/18/21  Evelina Dun A, FNP  fluticasone-salmeterol (ADVAIR) 250-50 MCG/ACT AEPB Inhale 1 puff into the lungs in the morning and at bedtime. 02/09/21   Gwenlyn Perking, FNP  furosemide (LASIX) 20 MG tablet Take 1 tablet (20 mg total) by mouth daily. Patient not taking: No sig  reported 11/09/20   Claretta Fraise, MD  gabapentin (NEURONTIN) 100 MG capsule Take 1 capsule (100 mg total) by mouth 3 (three) times daily. 03/29/21   Evelina Dun A, FNP  ibuprofen (ADVIL) 200 MG tablet Take 400 mg by mouth every 6 (six) hours as needed for moderate pain.    [provider]  lidocaine (LIDODERM) 5 % Place 1 patch onto the skin daily. Remove & Discard patch within 12 hours or as directed by MD 03/29/21   Sharion Balloon, FNP  metaxalone (SKELAXIN) 800 MG tablet Take 1 tablet (800 mg total) by mouth 3 (three) times daily as needed for muscle spasms. 03/24/21   Evelina Dun A, FNP  Nutritional Supplements (ENSURE NUTRITION SHAKE) LIQD Take 237 mLs by mouth 3 (three) times daily. 01/28/21   Sharion Balloon, FNP  Propylene Glycol (SYSTANE BALANCE) 0.6 % SOLN Place 1 drop into both eyes daily as needed (dry eyes).    [provider]  tolterodine (DETROL LA) 2 MG 24 hr capsule TAKE 1 CAPSULE (2 MG TOTAL) BY MOUTH DAILY. 02/25/21   Evelina Dun A, FNP  traMADol (ULTRAM) 50 MG tablet Take 0.5-1 tablets (25-50 mg total) by mouth every 8 (eight) hours as needed for up to 5 days. 03/29/21 04/03/21  Evelina Dun A, FNP  triamcinolone ointment (KENALOG) 0.5 % Apply 1 application topically 2 (two) times daily. Patient taking differently: Apply 1 application topically 2 (two) times daily as needed (rash). 11/04/20   Nicholas Lose, MD    Allergies    Hydrocodone, Codeine, and Spironolactone  Review of Systems   Review of Systems  Constitutional:  Negative for chills and fever.  Eyes:  Negative for visual disturbance.  Respiratory:  Negative for shortness of breath.   Cardiovascular:  Positive for leg swelling. Negative for chest pain and palpitations.  Gastrointestinal:  Negative for abdominal pain, nausea and vomiting.  Genitourinary:  Negative for difficulty urinating, dysuria, flank pain, frequency, hematuria, vaginal bleeding, vaginal discharge and vaginal pain.   Musculoskeletal:  Positive for back pain. Negative for neck pain.  Skin:  Negative for color change and rash.  Neurological:  Positive for weakness (baseline left upper extremity). Negative for dizziness, syncope, light-headedness, numbness and headaches.  Psychiatric/Behavioral:  Negative for confusion.    Physical Exam Updated Vital Signs BP (!) 118/101 (BP Location: Left Arm)   Pulse (!) 106   Temp 98 F (36.7  C) (Oral)   Resp 18   Ht 5\' 4"  (1.626 m)   Wt 39.9 kg   LMP 02/28/1999   SpO2 100%   BMI 15.11 kg/m   Physical Exam Vitals and nursing note reviewed.  Constitutional:      General: She is not in acute distress.    Appearance: She is not ill-appearing, toxic-appearing or diaphoretic.  Eyes:     General: No scleral icterus.       Right eye: No discharge.        Left eye: No discharge.     Extraocular Movements: Extraocular movements intact.     Pupils: Pupils are equal, round, and reactive to light.  Cardiovascular:     Rate and Rhythm: Tachycardia present.     Comments: At rate of 118 Pulmonary:     Effort: Pulmonary effort is normal. No tachypnea, bradypnea or respiratory distress.     Breath sounds: Normal breath sounds. No stridor.  Abdominal:     Palpations: Abdomen is soft. There is no mass or pulsatile mass.     Tenderness: There is no abdominal tenderness. There is no guarding or rebound.  Musculoskeletal:     Cervical back: Normal range of motion and neck supple. No rigidity.  Skin:    General: Skin is warm and dry.  Neurological:     General: No focal deficit present.     Mental Status: She is alert.     GCS: GCS eye subscore is 4. GCS verbal subscore is 5. GCS motor subscore is 6.     Cranial Nerves: No cranial nerve deficit or facial asymmetry.     Sensory: Sensation is intact.     Motor: No weakness or seizure activity.     Gait: Gait is intact.     Comments: CN II-XII intact; performed in supine position due to patient's complaints of lumbar  back pain, +5 strength to right upper extremity.  Patient has weakness to left upper extremity which she reports is baseline due to previous shoulder surgery. +5 strength to dorsiflexion and plantarflexion, patient able to left both legs against gravity and hold each there without difficulty.  Sensation to light touch intact to bilateral upper and lower extremities.  Psychiatric:        Behavior: Behavior is cooperative.    ED Results / Procedures / Treatments   Labs (all labs ordered are listed, but only abnormal results are displayed) Labs Reviewed  CBC WITH DIFFERENTIAL/PLATELET - Abnormal; Notable for the following components:      Result Value   Hemoglobin 10.9 (*)    HCT 34.0 (*)    Lymphs Abs 0.6 (*)    All other components within normal limits  COMPREHENSIVE METABOLIC PANEL - Abnormal; Notable for the following components:   Sodium 130 (*)    Chloride 91 (*)    Creatinine, Ser <0.30 (*)    Albumin 3.4 (*)    All other components within normal limits  BRAIN NATRIURETIC PEPTIDE    EKG None  Radiology DG Chest Portable 1 View  Result Date: 04/01/2021 CLINICAL DATA:  Back pain EXAM: PORTABLE CHEST 1 VIEW COMPARISON:  03/08/2021 FINDINGS: Cardiac shadow is within normal limits. Patient is somewhat rotated to the left. This accentuates the mediastinal markings. Minimal left basilar atelectasis is noted in retrocardiac position. No sizable effusion is seen. No pneumothorax is noted. Postsurgical changes are seen. No acute bony abnormality is noted. IMPRESSION: Minimal left basilar atelectasis. Electronically Signed   By: Elta Guadeloupe  Lukens M.D.   On: 04/01/2021 20:50    Procedures Procedures   Medications Ordered in ED Medications  ibuprofen (ADVIL) tablet 800 mg (800 mg Oral Given 04/01/21 2002)  ondansetron (ZOFRAN) injection 4 mg (4 mg Intravenous Given 04/01/21 2027)  morphine 4 MG/ML injection 4 mg (4 mg Intravenous Given 04/01/21 2028)  LORazepam (ATIVAN) injection 1 mg (1  mg Intravenous Given 04/01/21 2028)  sodium chloride 0.9 % bolus 1,000 mL (1,000 mLs Intravenous New Bag/Given 04/01/21 2220)    ED Course  I have reviewed the triage vital signs and the nursing notes.  Pertinent labs & imaging results that were available during my care of the patient were reviewed by me and considered in my medical decision making (see chart for details).    MDM Rules/Calculators/A&P                           Alert 70 year old female no distress, nontoxic-appearing.  Presents emergency department complaining of lumbar back pain and swelling to bilateral lower extremities.  Per chart review patient was seen 10/9 for similar complaints at [facility] emergency department.  CT lumbar spine obtained showed multilevel disc space narrowing, lumbar spondylosis with multilevel disc bulges, multilevel foraminal stenosis.  Patient was seen by orthopedic provider Dr.Stetler, MRI without contrast was ordered however has not been performed at this time.  Will obtain MRI without contrast, give patient medication for pain management and reassess.  Due to patient's history of cardiomyopathy and swelling to bilateral lower extremities concern for acute CHF.  Will obtain BNP, chest x-ray, and basic lab work.  Due to swelling greater in the right lower extremity concern for possible DVT, unable to obtain ultrasound imaging to assess for DVT at this time.  We will give patient dose of Lovenox and have her return tomorrow for further imaging.  Patient had decrease in blood pressure after receiving Ativan and morphine.  Patient had patent airway and no acute respiratory distress.  Blood pressure improved after fluid bolus.  Low suspicion for CHF at this time as BNP within normal limits, chest x-ray shows no pleural effusions.  Seen patient does hyponatremia hypochloridemia which appear to be at patient's baseline.    CBC shows hemoglobin 10.9, slightly decreased from previous labs  obtained.  MRI imaging pending at this time.  If MRI shows no surgical emergencies plan to discharge patient to follow-up with orthopedic provider.  Patient care was discussed with attending physician Dr.Yao  Patient care transferred to [place] at the end of my shift. Patient presentation, ED course, and plan of care discussed with review of all pertinent labs and imaging. Please see his/her note for further details regarding further ED course and disposition.   Final Clinical Impression(s) / ED Diagnoses Final diagnoses:  None    Rx / DC Orders ED Discharge Orders     None        Loni Beckwith, PA-C 04/01/21 2359    Drenda Freeze, MD 04/04/21 [phone]

## 2021-04-02 MED ORDER — NALOXONE HCL 2 MG/2ML IJ SOSY
1.0000 mg | PREFILLED_SYRINGE | Freq: Once | INTRAMUSCULAR | Status: AC
  Administered 2021-04-02: 1 mg via INTRAVENOUS
  Filled 2021-04-02: qty 2

## 2021-04-02 MED ORDER — LORAZEPAM 1 MG PO TABS: 1.0000 mg | ORAL_TABLET | Freq: Once | ORAL | Status: DC

## 2021-04-02 NOTE — ED Notes (Signed)
Patient has no concerns after AVS has been reviewed and patient education provided. Patient discharged. AVS reviewed again before patient left.

## 2021-04-02 NOTE — ED Provider Notes (Signed)
Assumed care from prior team.  Briefly, 70 y.o. F here with low back and leg swelling.  Labs overall reassuring.    Plan:  MRI lumbar and femur pending.  If negative, can discharge home and will return in AM for duplex of LE to exclude DVT.  Results for orders placed or performed during the hospital encounter of 04/01/21  Brain natriuretic peptide  Result Value Ref Range   B Natriuretic Peptide 58.5 0.0 - 100.0 pg/mL  CBC with Differential  Result Value Ref Range   WBC 4.7 4.0 - 10.5 K/uL   RBC 3.91 3.87 - 5.11 MIL/uL   Hemoglobin 10.9 (L) 12.0 - 15.0 g/dL   HCT 34.0 (L) 36.0 - 46.0 %   MCV 87.0 80.0 - 100.0 fL   MCH 27.9 26.0 - 34.0 pg   MCHC 32.1 30.0 - 36.0 g/dL   RDW 14.4 11.5 - 15.5 %   Platelets 215 150 - 400 K/uL   nRBC 0.0 0.0 - 0.2 %   Neutrophils Relative % 73 %   Neutro Abs 3.4 1.7 - 7.7 K/uL   Lymphocytes Relative 13 %   Lymphs Abs 0.6 (L) 0.7 - 4.0 K/uL   Monocytes Relative 9 %   Monocytes Absolute 0.4 0.1 - 1.0 K/uL   Eosinophils Relative 5 %   Eosinophils Absolute 0.2 0.0 - 0.5 K/uL   Basophils Relative 0 %   Basophils Absolute 0.0 0.0 - 0.1 K/uL   Immature Granulocytes 0 %   Abs Immature Granulocytes 0.02 0.00 - 0.07 K/uL  Comprehensive metabolic panel  Result Value Ref Range   Sodium 130 (L) 135 - 145 mmol/L   Potassium 3.6 3.5 - 5.1 mmol/L   Chloride 91 (L) 98 - 111 mmol/L   CO2 31 22 - 32 mmol/L   Glucose, Bld 81 70 - 99 mg/dL   BUN 9 8 - 23 mg/dL   Creatinine, Ser <0.30 (L) 0.44 - 1.00 mg/dL   Calcium 8.9 8.9 - 10.3 mg/dL   Total Protein 6.8 6.5 - 8.1 g/dL   Albumin 3.4 (L) 3.5 - 5.0 g/dL   AST 17 15 - 41 U/L   ALT 12 0 - 44 U/L   Alkaline Phosphatase 83 38 - 126 U/L   Total Bilirubin 0.4 0.3 - 1.2 mg/dL   GFR, Estimated NOT CALCULATED >60 mL/min   Anion gap 8 5 - 15   DG Chest 2 View  Result Date: 03/08/2021 CLINICAL DATA:  Shortness of breath EXAM: CHEST - 2 VIEW COMPARISON:  01/28/2021 FINDINGS: Normal heart size. Atherosclerotic  calcification of the aortic knob. Hyperexpanded, hyperlucent lungs. No focal consolidation. No pleural effusion or pneumothorax. Reverse left shoulder arthroplasty. Surgical clips project over the left chest wall. IMPRESSION: No acute cardiopulmonary disease. COPD. Electronically Signed   By: Davina Poke D.O.   On: 03/08/2021 13:30   MR LUMBAR SPINE WO CONTRAST  Result Date: 04/02/2021 CLINICAL DATA:  Low back pain EXAM: MRI LUMBAR SPINE WITHOUT CONTRAST TECHNIQUE: Multiplanar, multisequence MR imaging of the lumbar spine was performed. No intravenous contrast was administered. COMPARISON:  None. FINDINGS: Segmentation:  Standard. Alignment: Reversal of normal lumbar lordosis. Dextroscoliosis with apex at L3. Vertebrae:  No fracture, evidence of discitis, or bone lesion. Conus medullaris and cauda equina: Conus extends to the L1 level. Conus and cauda equina appear normal. Paraspinal and other soft tissues: Negative. Disc levels: L1-L2: Disc bulge and endplate spurring. No spinal canal stenosis. No neural foraminal stenosis. L2-L3: Left asymmetric disc bulge  with endplate spurring. No spinal canal stenosis. No neural foraminal stenosis. L3-L4: Moderate facet hypertrophy. Small disc bulge with endplate spurring. Right lateral recess narrowing without central spinal canal stenosis. Mild left neural foraminal stenosis. L4-L5: Small disc bulge. Left-greater-than-right facet arthrosis. No spinal canal stenosis. Mild right neural foraminal stenosis. L5-S1: Normal disc space and facet joints. No spinal canal stenosis. No neural foraminal stenosis. Visualized sacrum: Normal. IMPRESSION: 1. Dextroscoliosis with apex at L3 and reversal of normal lumbar lordosis. 2. No central spinal canal stenosis. 3. Mild left L3-4 and right L4-5 neural foraminal stenosis. 4. Right lateral recess narrowing at L3-4. Correlate for left L4 radiculopathy. Electronically Signed   By: Ulyses Jarred M.D.   On: 04/02/2021 00:08   DG  Chest Portable 1 View  Result Date: 04/01/2021 CLINICAL DATA:  Back pain EXAM: PORTABLE CHEST 1 VIEW COMPARISON:  03/08/2021 FINDINGS: Cardiac shadow is within normal limits. Patient is somewhat rotated to the left. This accentuates the mediastinal markings. Minimal left basilar atelectasis is noted in retrocardiac position. No sizable effusion is seen. No pneumothorax is noted. Postsurgical changes are seen. No acute bony abnormality is noted. IMPRESSION: Minimal left basilar atelectasis. Electronically Signed   By: Inez Catalina M.D.   On: 04/01/2021 20:50     MRI lumbar spine with dextro scoliosis, no canal stenosis, some mild L3-L5 foraminal stenosis, may account for some radiculopathy.  1:29 AM Patient still very somnolent from her pain medication and ativan.  She is not very arouseable on repeat exam. BP soft.  Still awaiting remainder of MRI results.  Given dose of narcan to see if this helps.  2:20 AM Delay in Femur MRI as tech did not "end exam" so radiology not able to see images.  MRI tech has already left this facility.  I was able to get in touch with MRI tech at [facility] who was able to enter chart and end exam.  Radiology to read soon.  2:40 AM Discussed with radiology-- no emergent findings on MRI femur, but does have what appears to be sclerotic lesion in proximal femur.  This is just preliminary read, [facility] radiologist will over-read and give full report at [place].  3:31 AM Patient still quite drowsy.  She did improve some with IV narcan.  Will need to metabolize further.  BP has improved.  6:01 AM Patient now awake/alert.  She is able to answer questions and follow commands. She is tolerating water at this time.  VSS. I discussed results of MRI with patient and husband.  She will need to follow-up with oncology (Dr. Lindi Adie) along with orthopedics (Dr. Sheliah Plane) for further evaluation of these things.    She can follow-up with PCP in the interim.  As she appears to be quite  sensitive to pain medication, will not make any adjustments to her home regimen of ultram and tylenol.  Can return here for new concerns.   Larene Pickett, PA-C 04/02/21 0606    Maudie Flakes, MD 04/02/21 [phone]

## 2021-04-02 NOTE — Discharge Instructions (Signed)
You are found to have sclerotic lesion in your proximal right femur.  It is recommended that you follow-up with your orthopedist along with your oncologist for further evaluation and management of this. Continue your home pain medications. Can follow-up with your primary care doctor in the interim. Return here for new concerns.

## 2021-04-06 ENCOUNTER — Other Ambulatory Visit: Payer: Self-pay | Admitting: Family

## 2021-04-06 DIAGNOSIS — R5381 Other malaise: Secondary | ICD-10-CM

## 2021-04-08 ENCOUNTER — Ambulatory Visit (INDEPENDENT_AMBULATORY_CARE_PROVIDER_SITE_OTHER): Payer: 59 | Admitting: Family

## 2021-04-08 ENCOUNTER — Telehealth: Payer: 59 | Admitting: Family

## 2021-04-08 ENCOUNTER — Encounter: Payer: Self-pay | Admitting: Family

## 2021-04-08 ENCOUNTER — Other Ambulatory Visit: Payer: Self-pay

## 2021-04-08 DIAGNOSIS — M5442 Lumbago with sciatica, left side: Secondary | ICD-10-CM | POA: Diagnosis not present

## 2021-04-08 MED ORDER — KETOROLAC TROMETHAMINE 10 MG PO TABS: 10.0000 mg | ORAL_TABLET | Freq: Four times a day (QID) | ORAL | 0 refills | Status: DC | PRN

## 2021-04-08 NOTE — Progress Notes (Signed)
   Virtual Visit  Note Due to COVID-19 pandemic this visit was conducted virtually. This visit type was conducted due to national recommendations for restrictions regarding the COVID-19 Pandemic (e.g. social distancing, sheltering in place) in an effort to limit this patient's exposure and mitigate transmission in our community. All issues noted in this document were discussed and addressed.  A physical exam was not performed with this format.  I connected with Sherry Bender on 04/08/21 at 4:18 pm  by telephone and verified that I am speaking with the correct person using two identifiers. Sherry Bender is currently located at home and her husband  is currently with her during visit. The provider, Evelina Dun, FNP is located in their office at time of visit.  I discussed the limitations, risks, security and privacy concerns of performing an evaluation and management service by telephone and the availability of in person appointments. I also discussed with the patient that there may be a patient responsible charge related to this service. The patient expressed understanding and agreed to proceed.   History and Present Illness:  PT was seen by ortho today for back pain. She was given IM Toradol today. She was told she would have to call her PCP for NSAID's oral. She had MRI of her lumbar that showed, "1. Dextroscoliosis with apex at L3 and reversal of normal lumbar lordosis. 2. No central spinal canal stenosis. 3. 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 Right lateral recess narrowing at L3-4. Correlate for left L4 radiculopathy." Back Pain The current episode started more than 1 month ago. The problem occurs constantly. The problem has been waxing and waning since onset. The quality of the pain is described as aching and burning. The pain is at a severity of 7/10. The pain is moderate. The symptoms are aggravated by twisting. Associated symptoms include leg pain and weakness. She has  tried NSAIDs and bed rest for the symptoms. The treatment provided mild relief.    Review of Systems  Musculoskeletal:  Positive for back pain.  Neurological:  Positive for weakness.    Observations/Objective: No SOB or distress noted   Assessment and Plan: 1. Acute left-sided low back pain with left-sided sciatica [facility] appt will give short course of toradol  No other NSAID's RTO if symptoms worsen or do not improve  - ketorolac (TORADOL) 10 MG tablet; Take 1 tablet (10 mg total) by mouth every 6 (six) hours as needed.  Dispense: 20 tablet; Refill: 0     I discussed the assessment and treatment plan with the patient. The patient was provided an opportunity to ask questions and all were answered. The patient agreed with the plan and demonstrated an understanding of the instructions.   The patient was advised to call back or seek an in-person evaluation if the symptoms worsen or if the condition fails to improve as anticipated.  The above assessment and management plan was discussed with the patient. The patient verbalized understanding of and has agreed to the management plan. Patient is aware to call the clinic if symptoms persist or worsen. Patient is aware when to return to the clinic for a follow-up visit. Patient educated on when it is appropriate to go to the emergency department.   Time call ended:  4:30 pm   I provided 12 minutes of  non face-to-face time during this encounter.    Evelina Dun, FNP

## 2021-04-12 DIAGNOSIS — Z029 Encounter for administrative examinations, unspecified: Secondary | ICD-10-CM

## 2021-04-15 ENCOUNTER — Other Ambulatory Visit: Payer: Self-pay | Admitting: Family

## 2021-04-15 DIAGNOSIS — M5442 Lumbago with sciatica, left side: Secondary | ICD-10-CM

## 2021-04-19 ENCOUNTER — Telehealth: Payer: Self-pay | Admitting: *Deleted

## 2021-04-19 NOTE — Telephone Encounter (Signed)
FYI: VM from [facility] w/ [facility] today 88 lb, below their parameters of 113 lb

## 2021-04-25 ENCOUNTER — Telehealth: Payer: Self-pay | Admitting: *Deleted

## 2021-04-25 NOTE — Telephone Encounter (Signed)
FYI: VM from [facility] w/ [facility] today 86.2 lb, down from last week of 88 lb, below their parameters of 113 lb

## 2021-04-26 ENCOUNTER — Other Ambulatory Visit: Payer: Self-pay

## 2021-04-26 ENCOUNTER — Ambulatory Visit (INDEPENDENT_AMBULATORY_CARE_PROVIDER_SITE_OTHER): Payer: 59

## 2021-04-26 DIAGNOSIS — J449 Chronic obstructive pulmonary disease, unspecified: Secondary | ICD-10-CM

## 2021-04-26 DIAGNOSIS — E785 Hyperlipidemia, unspecified: Secondary | ICD-10-CM

## 2021-04-26 DIAGNOSIS — M4316 Spondylolisthesis, lumbar region: Secondary | ICD-10-CM

## 2021-04-26 DIAGNOSIS — M5440 Lumbago with sciatica, unspecified side: Secondary | ICD-10-CM | POA: Diagnosis not present

## 2021-04-26 DIAGNOSIS — K219 Gastro-esophageal reflux disease without esophagitis: Secondary | ICD-10-CM

## 2021-04-26 DIAGNOSIS — M1389 Other specified arthritis, multiple sites: Secondary | ICD-10-CM | POA: Diagnosis not present

## 2021-04-26 DIAGNOSIS — M47816 Spondylosis without myelopathy or radiculopathy, lumbar region: Secondary | ICD-10-CM | POA: Diagnosis not present

## 2021-04-26 DIAGNOSIS — I509 Heart failure, unspecified: Secondary | ICD-10-CM

## 2021-04-26 DIAGNOSIS — I10 Essential (primary) hypertension: Secondary | ICD-10-CM

## 2021-04-26 DIAGNOSIS — Z7982 Long term (current) use of aspirin: Secondary | ICD-10-CM

## 2021-05-04 ENCOUNTER — Other Ambulatory Visit: Payer: Self-pay | Admitting: Family

## 2021-05-09 ENCOUNTER — Other Ambulatory Visit: Payer: Self-pay | Admitting: Family

## 2021-05-09 ENCOUNTER — Other Ambulatory Visit: Payer: Self-pay | Admitting: Family Medicine

## 2021-05-09 DIAGNOSIS — M5442 Lumbago with sciatica, left side: Secondary | ICD-10-CM

## 2021-05-09 DIAGNOSIS — J449 Chronic obstructive pulmonary disease, unspecified: Secondary | ICD-10-CM

## 2021-05-10 NOTE — Telephone Encounter (Signed)
Pt ntbs for refills of Toradol if not any better per [facility] notes

## 2021-05-10 NOTE — Telephone Encounter (Signed)
Advair not on current med list  Ok to fill?

## 2021-05-10 NOTE — Telephone Encounter (Signed)
Patient aware and wants to know if it can be phone call or does it need to be in person?

## 2021-05-14 ENCOUNTER — Other Ambulatory Visit: Payer: Self-pay | Admitting: Cardiology

## 2021-05-24 ENCOUNTER — Telehealth: Payer: Self-pay | Admitting: Family

## 2021-05-31 ENCOUNTER — Ambulatory Visit: Payer: Medicare Other

## 2021-07-04 ENCOUNTER — Ambulatory Visit (INDEPENDENT_AMBULATORY_CARE_PROVIDER_SITE_OTHER): Payer: Medicare Other

## 2021-07-04 VITALS — Ht 64.0 in | Wt 88.0 lb

## 2021-07-04 DIAGNOSIS — Z0000 Encounter for general adult medical examination without abnormal findings: Secondary | ICD-10-CM

## 2021-07-04 NOTE — Patient Instructions (Signed)
Sherry Bender , Thank you for taking time to come for your Medicare Wellness Visit. I appreciate your ongoing commitment to your health goals. Please review the following plan we discussed and let me know if I can assist you in the future.   Screening recommendations/referrals: Colonoscopy: Declined.  Mammogram: Done 11/09/2017 Repeat annually  Bone Density: Declined.  Recommended yearly ophthalmology/optometry visit for glaucoma screening and checkup Recommended yearly dental visit for hygiene and checkup  Vaccinations: Influenza vaccine: Done Repeat annually  Pneumococcal vaccine: Done 01/28/2021. Second dose due at your convenience. Tdap vaccine: Due Repeat in 10 years  Shingles vaccine: Shingrix discussed. Please contact your pharmacy for coverage information.     Covid-19: Done 08/27/2019, 11/22/2019   Advanced directives: Advance directive discussed with you today. I have provided a copy for you to complete at home and have notarized. Once this is complete please bring a copy in to our office so we can scan it into your chart. Mailed out today.  Conditions/risks identified: Aim for 30 minutes of exercise each day, drink 6-8 glasses of water and eat lots of fruits and vegetables.   Next appointment: Follow up in one year for your annual wellness visit 2024.   Preventive Care 38 Years and Older, Female Preventive care refers to lifestyle choices and visits with your health care provider that can promote health and wellness. What does preventive care include? A yearly physical exam. This is also called an annual well check. Dental exams once or twice a year. Routine eye exams. Ask your health care provider how often you should have your eyes checked. Personal lifestyle choices, including: Daily care of your teeth and gums. Regular physical activity. Eating a healthy diet. Avoiding tobacco and drug use. Limiting alcohol use. Practicing safe sex. Taking low-dose aspirin every  day. Taking vitamin and mineral supplements as recommended by your health care provider. What happens during an annual well check? The services and screenings done by your health care provider during your annual well check will depend on your age, overall health, lifestyle risk factors, and family history of disease. Counseling  Your health care provider may ask you questions about your: Alcohol use. Tobacco use. Drug use. Emotional well-being. Home and relationship well-being. Sexual activity. Eating habits. History of falls. Memory and ability to understand (cognition). Work and work Statistician. Reproductive health. Screening  You may have the following tests or measurements: Height, weight, and BMI. Blood pressure. Lipid and cholesterol levels. These may be checked every 5 years, or more frequently if you are over 96 years old. Skin check. Lung cancer screening. You may have this screening every year starting at age 29 if you have a 30-pack-year history of smoking and currently smoke or have quit within the past 15 years. Fecal occult blood test (FOBT) of the stool. You may have this test every year starting at age 77. Flexible sigmoidoscopy or colonoscopy. You may have a sigmoidoscopy every 5 years or a colonoscopy every 10 years starting at age 40. Hepatitis C blood test. Hepatitis B blood test. Sexually transmitted disease (STD) testing. Diabetes screening. This is done by checking your blood sugar (glucose) after you have not eaten for a while (fasting). You may have this done every 1-3 years. Bone density scan. This is done to screen for osteoporosis. You may have this done starting at age 62. Mammogram. This may be done every 1-2 years. Talk to your health care provider about how often you should have regular mammograms. Talk with your  health care provider about your test results, treatment options, and if necessary, the need for more tests. Vaccines  Your health care  provider may recommend certain vaccines, such as: Influenza vaccine. This is recommended every year. Tetanus, diphtheria, and acellular pertussis (Tdap, Td) vaccine. You may need a Td booster every 10 years. Zoster vaccine. You may need this after age 59. Pneumococcal 13-valent conjugate (PCV13) vaccine. One dose is recommended after age 67. Pneumococcal polysaccharide (PPSV23) vaccine. One dose is recommended after age 24. Talk to your health care provider about which screenings and vaccines you need and how often you need them. This information is not intended to replace advice given to you by your health care provider. Make sure you discuss any questions you have with your health care provider. Document Released: 07/02/2015 Document Revised: 02/23/2016 Document Reviewed: 04/06/2015 Elsevier Interactive Patient Education  2017 [place] Prevention in the Home Falls can cause injuries. They can happen to people of all ages. There are many things you can do to make your home safe and to help prevent falls. What can I do on the outside of my home? Regularly fix the edges of walkways and driveways and fix any cracks. Remove anything that might make you trip as you walk through a door, such as a raised step or threshold. Trim any bushes or trees on the path to your home. Use bright outdoor lighting. Clear any walking paths of anything that might make someone trip, such as rocks or tools. Regularly check to see if handrails are loose or broken. Make sure that both sides of any steps have handrails. Any raised decks and porches should have guardrails on the edges. Have any leaves, snow, or ice cleared regularly. Use sand or salt on walking paths during winter. Clean up any spills in your garage right away. This includes oil or grease spills. What can I do in the bathroom? Use night lights. Install grab bars by the toilet and in the tub and shower. Do not use towel bars as grab  bars. Use non-skid mats or decals in the tub or shower. If you need to sit down in the shower, use a plastic, non-slip stool. Keep the floor dry. Clean up any water that spills on the floor as soon as it happens. Remove soap buildup in the tub or shower regularly. Attach bath mats securely with double-sided non-slip rug tape. Do not have throw rugs and other things on the floor that can make you trip. What can I do in the bedroom? Use night lights. Make sure that you have a light by your bed that is easy to reach. Do not use any sheets or blankets that are too big for your bed. They should not hang down onto the floor. Have a firm chair that has side arms. You can use this for support while you get dressed. Do not have throw rugs and other things on the floor that can make you trip. What can I do in the kitchen? Clean up any spills right away. Avoid walking on wet floors. Keep items that you use a lot in easy-to-reach places. If you need to reach something above you, use a strong step stool that has a grab bar. Keep electrical cords out of the way. Do not use floor polish or wax that makes floors slippery. If you must use wax, use non-skid floor wax. Do not have throw rugs and other things on the floor that can make you trip. What  can I do with my stairs? Do not leave any items on the stairs. Make sure that there are handrails on both sides of the stairs and use them. Fix handrails that are broken or loose. Make sure that handrails are as long as the stairways. Check any carpeting to make sure that it is firmly attached to the stairs. Fix any carpet that is loose or worn. Avoid having throw rugs at the top or bottom of the stairs. If you do have throw rugs, attach them to the floor with carpet tape. Make sure that you have a light switch at the top of the stairs and the bottom of the stairs. If you do not have them, ask someone to add them for you. What else can I do to help prevent  falls? Wear shoes that: Do not have high heels. Have rubber bottoms. Are comfortable and fit you well. Are closed at the toe. Do not wear sandals. If you use a stepladder: Make sure that it is fully opened. Do not climb a closed stepladder. Make sure that both sides of the stepladder are locked into place. Ask someone to hold it for you, if possible. Clearly mark and make sure that you can see: Any grab bars or handrails. First and last steps. Where the edge of each step is. Use tools that help you move around (mobility aids) if they are needed. These include: Canes. Walkers. Scooters. Crutches. Turn on the lights when you go into a dark area. Replace any light bulbs as soon as they burn out. Set up your furniture so you have a clear path. Avoid moving your furniture around. If any of your floors are uneven, fix them. If there are any pets around you, be aware of where they are. Review your medicines with your doctor. Some medicines can make you feel dizzy. This can increase your chance of falling. Ask your doctor what other things that you can do to help prevent falls. This information is not intended to replace advice given to you by your health care provider. Make sure you discuss any questions you have with your health care provider. Document Released: 04/01/2009 Document Revised: 11/11/2015 Document Reviewed: 07/10/2014 Elsevier Interactive Patient Education  2017 [employer].

## 2021-07-04 NOTE — Progress Notes (Signed)
Subjective:   Sherry Bender is a 71 y.o. female who presents for an Initial Medicare Annual Wellness Visit. Virtual Visit via Telephone Note  I connected with  Sherry Bender on 07/04/21 at  2:00 PM EST by telephone and verified that I am speaking with the correct person using two identifiers.  Location: Patient: Home Provider: [facility] Persons participating in the virtual visit: patient/Nurse Health Advisor   I discussed the limitations, risks, security and privacy concerns of performing an evaluation and management service by telephone and the availability of in person appointments. The patient expressed understanding and agreed to proceed.  Interactive audio and video telecommunications were attempted between this nurse and patient, however failed, due to patient having technical difficulties OR patient did not have access to video capability.  We continued and completed visit with audio only.  Some vital signs may be absent or patient reported.   Chriss Driver, LPN  Review of Systems     Cardiac Risk Factors include: advanced age (>72men, >56 women);hypertension;dyslipidemia;sedentary lifestyle     Objective:    Today's Vitals   07/04/21 1405  Weight: 88 lb (39.9 kg)  Height: 5\' 4"  (1.626 m)   Body mass index is 15.11 kg/m.  Advanced Directives 07/04/2021 04/01/2021 03/08/2021 11/30/2020 11/27/2020 11/12/2020 07/09/2020  Does Patient Have a Medical Advance Directive? No No No - No No No  Does patient want to make changes to medical advance directive? - - - - - - -  Copy of [place] in Chart? - - - - - - -  Would patient like information on creating a medical advance directive? No - Patient declined Yes (ED - Information included in AVS) - No - Patient declined - No - Patient declined No - Patient declined    Current Medications (verified) Outpatient Encounter Medications as of 07/04/2021  Medication Sig   albuterol (PROVENTIL) (2.5 MG/3ML) 0.083%  nebulizer solution Take 3 mLs (2.5 mg total) by nebulization every 6 (six) hours as needed for wheezing or shortness of breath.   albuterol (VENTOLIN HFA) 108 (90 Base) MCG/ACT inhaler INHALE 2 PUFFS EVERY SIX (6) HOURS AS NEEDED FOR SHORTNESS OF BREATH.   aspirin EC 81 MG tablet Take 1 tablet (81 mg total) by mouth daily. Swallow whole.   atorvastatin (LIPITOR) 20 MG tablet TAKE 1 TABLET BY MOUTH EVERY DAY (Patient taking differently: Take 20 mg by mouth daily.)   carvedilol (COREG) 3.125 MG tablet Take 0.5 tablets (1.55 mg total) by mouth in the morning and at bedtime.   diclofenac (VOLTAREN) 75 MG EC tablet Take 1 tablet (75 mg total) by mouth 2 (two) times daily.   diphenhydrAMINE (BENADRYL) 25 MG tablet Take 25 mg by mouth at bedtime as needed for sleep.   ENTRESTO 24-26 MG TAKE 1 TABLET BY MOUTH 2 (TWO) TIMES DAILY. DUE FOR FOLLOW UP APPT   fluticasone-salmeterol (ADVAIR) 250-50 MCG/ACT AEPB Inhale 1 puff into the lungs in the morning and at bedtime.   lidocaine (LIDODERM) 5 % Place onto the skin.   Propylene Glycol (SYSTANE BALANCE) 0.6 % SOLN Place 1 drop into both eyes daily as needed (dry eyes).   tolterodine (DETROL LA) 2 MG 24 hr capsule TAKE 1 CAPSULE (2 MG TOTAL) BY MOUTH DAILY.   esomeprazole (NEXIUM) 40 MG capsule Take 1 capsule (40 mg total) by mouth daily.   ketorolac (TORADOL) 10 MG tablet Take 1 tablet (10 mg total) by mouth every 6 (six) hours as needed. (Patient  not taking: Reported on 07/04/2021)   No facility-administered encounter medications on file as of 07/04/2021.    Allergies (verified) Hydrocodone, Codeine, Gabapentin, and Spironolactone   History: Past Medical History:  Diagnosis Date   Anxiety    Arthritis    Breast cancer ([place]) 2016   Cardiomyopathy ([place])    a. EF 25-30% by echocardiogram in 02/2019 b. 30-35% in 05/2019 c. normalized to 55-60% by echo in 02/2020   Coronary atherosclerosis    Cardiac CTA with negative FFR, nonobstructive March 2021    Cricopharyngeus muscle dysfunction    Gastric ulcer    GERD (gastroesophageal reflux disease)    Hiatal hernia    Hyperlipidemia    Hypertension    PONV (postoperative nausea and vomiting)    Scoliosis    Scoliosis    Skin cancer    Urinary frequency    Past Surgical History:  Procedure Laterality Date   ABDOMINAL HYSTERECTOMY  2000   TAH/BSO   ANTERIOR CERVICAL DECOMP/DISCECTOMY FUSION N/A 06/18/2014   Procedure: C4-5 C5-6 C6-7 ANTERIOR CERVICAL DECOMPRESSION/DISKECTOMY/FUSION;  Surgeon: Eustace Moore, MD;  Location: [facility];  Service: Neurosurgery;  Laterality: N/A;  C4-5 C5-6 C6-7 ANTERIOR CERVICAL DECOMPRESSION/DISKECTOMY/FUSION   ANTERIOR CERVICAL DECOMP/DISCECTOMY FUSION N/A 06/25/2014   Procedure: EXPLORATION OF CERVICAL FUSION WITH REVIOSN OF HARDWARE;  Surgeon: Eustace Moore, MD;  Location: [place] [facility];  Service: Neurosurgery;  Laterality: N/A;   BIOPSY  01/01/2020   Procedure: BIOPSY;  Surgeon: Mauri Pole, MD;  Location: [facility];  Service: Endoscopy;;   CERVICAL FUSION     CHOLECYSTECTOMY  1994   COMPLETE MASTECTOMY W/ SENTINEL NODE BIOPSY Left 04/30/2015   ESOPHAGOGASTRODUODENOSCOPY (EGD) WITH PROPOFOL N/A 01/01/2020   Procedure: ESOPHAGOGASTRODUODENOSCOPY (EGD) WITH PROPOFOL;  Surgeon: Mauri Pole, MD;  Location: [facility];  Service: Endoscopy;  Laterality: N/A;   FOOT NEUROMA SURGERY  01/2010   also had metatarasl shortened   KNEE ARTHROSCOPY  2005   right knee   MASTECTOMY Left 2016   PORT-A-CATH REMOVAL Right 04/30/2015   Procedure: REMOVAL PORT-A-CATH;  Surgeon: Fanny Skates, MD;  Location: [facility];  Service: General;  Laterality: Right;   PORTACATH PLACEMENT N/A 11/23/2014   Procedure: INSERTION PORT-A-CATH WITH ULTRASOUND;  Surgeon: Fanny Skates, MD;  Location: [facility];  Service: General;  Laterality: N/A;   REVERSE SHOULDER ARTHROPLASTY Left 07/15/2020   Procedure: REVERSE SHOULDER ARTHROPLASTY;  Surgeon: Mordecai Rasmussen, MD;  Location: [facility];  Service: Orthopedics;  Laterality: Left;   RHINOPLASTY     SIMPLE MASTECTOMY WITH AXILLARY SENTINEL NODE BIOPSY Left 04/30/2015   Procedure: LEFT TOTAL  MASTECTOMY WITH LEFT AXILLARY SENTINEL NODE BIOPSY;  Surgeon: Fanny Skates, MD;  Location: [facility];  Service: General;  Laterality: Left;   Family History  Problem Relation Age of Onset   Heart disease Mother    Hypertension Mother    Diabetes Mother    Heart disease Father    Hypertension Father    Cancer Maternal Grandmother        ? ovarian cancer vs. cervical   Cancer Paternal Aunt        ? breast   Social History   Socioeconomic History   Marital status: Married    Spouse name: Gwyndolyn Saxon   Number of children: 2   Years of education: Not on file   Highest education level: Not on file  Occupational History   Not on file  Tobacco Use   Smoking status: Former  Packs/day: 0.25    Years: 42.00    Pack years: 10.50    Types: Cigarettes    Quit date: 01/24/2017    Years since quitting: 4.4   Smokeless tobacco: Never  Vaping Use   Vaping Use: Never used  Substance and Sexual Activity   Alcohol use: No   Drug use: No   Sexual activity: Never  Other Topics Concern   Not on file  Social History Narrative   Caffeine Use: 1 soda weekly   Regular exercise:  No   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     Two daughters born 5 and 39- one in [place] one in [place].             Social Determinants of Health   Financial Resource Strain: Low Risk    Difficulty of Paying Living Expenses: Not hard at all  Food Insecurity: No Food Insecurity   Worried About Charity fundraiser in the Last Year: Never true   [place] in the Last Year: Never true  Transportation Needs: No Transportation Needs   Lack of Transportation (Medical): No   Lack of Transportation (Non-Medical): No  Physical Activity: Insufficiently Active   Days of Exercise per  Week: 2 days   Minutes of Exercise per Session: 20 min  Stress: No Stress Concern Present   Feeling of Stress : Not at all  Social Connections: Socially Integrated   Frequency of Communication with Friends and Family: More than three times a week   Frequency of Social Gatherings with Friends and Family: Never   Attends Religious Services: 1 to 4 times per year   Active Member of [employer] or Organizations: Yes   Attends Archivist Meetings: Never   Marital Status: Married    Tobacco Counseling Counseling given: Not Answered   Clinical Intake:  Pre-visit preparation completed: Yes  Pain : No/denies pain     BMI - recorded: 15.11 Nutritional Status: BMI <19  Underweight Nutritional Risks: Unintentional weight loss (Due to breast cancer 2016.) Diabetes: No  How often do you need to have someone help you when you read instructions, pamphlets, or other written materials from your doctor or pharmacy?: 1 - Never  Diabetic?no  Interpreter Needed?: No  Information entered by :: Sherry Kailoni Vahle, LPN   Activities of Daily Living In your present state of health, do you have any difficulty performing the following activities: 07/04/2021 11/28/2020  Hearing? N N  Vision? N Y  Difficulty concentrating or making decisions? N Y  Walking or climbing stairs? N Y  Dressing or bathing? N Y  Doing errands, shopping? N Y  Conservation officer, nature and eating ? N -  Using the Toilet? N -  In the past six months, have you accidently leaked urine? N -  Do you have problems with loss of bowel control? N -  Managing your Medications? N -  Managing your Finances? N -  Housekeeping or managing your Housekeeping? N -  Some recent data might be hidden    Patient Care Team: Sharion Balloon, FNP as PCP - General (Family Medicine) Satira Sark, MD as PCP - Cardiology (Cardiology) Dr Geanie Berlin (Gynecology) Holley Bouche, NP (Inactive) as Nurse Practitioner (Nurse Practitioner) Nicholas Lose, MD  as Consulting Physician (Hematology and Oncology) Fanny Skates, MD as Consulting Physician (General Surgery) Kyung Rudd, MD as Consulting Physician (Radiation Oncology) Sylvan Cheese, NP as Nurse Practitioner (  Hematology and Oncology)  Indicate any recent Medical Services you may have received from other than [facility] providers in the past year (date may be approximate).     Assessment:   This is a routine wellness examination for Sherry Bender.  Hearing/Vision screen Hearing Screening - Comments:: No hearing issues.   Vision Screening - Comments:: Glasses for driving at night. [facility] [place]. 2022.  Dietary issues and exercise activities discussed: Current Exercise Habits: The patient does not participate in regular exercise at present, Exercise limited by: cardiac condition(s)   Goals Addressed             This Visit's Progress    Exercise 3x per week (30 min per time)       Chair exercises to increase strength.     Have 3 meals a day         Depression Screen PHQ 2/9 Scores 07/04/2021 01/28/2021 03/16/2020 12/26/2018 12/26/2018 06/26/2018 09/03/2017  PHQ - 2 Score 0 0 0 0 0 2 0  PHQ- 9 Score - 0 - - - 4 -    Fall Risk Fall Risk  07/04/2021 12/26/2018 09/03/2017 05/07/2017 12/25/2016  Falls in the past year? 0 0 Yes No No  Number falls in past yr: 0 - 1 - -  Injury with Fall? 0 - Yes - -  Comment - - Fractured shoulder. - -  Risk for fall due to : Impaired balance/gait;Impaired mobility - - - -  Follow up Falls prevention discussed - - - -    FALL RISK PREVENTION PERTAINING TO THE HOME:  Any stairs in or around the home? Yes  If so, are there any without handrails? No  Home free of loose throw rugs in walkways, pet beds, electrical cords, etc? Yes  Adequate lighting in your home to reduce risk of falls? Yes   ASSISTIVE DEVICES UTILIZED TO PREVENT FALLS:  Life alert? No  Use of a cane, walker or w/c? Yes  Grab bars in the bathroom? Yes  Shower chair or bench in  shower? No  Elevated toilet seat or a handicapped toilet? Yes   TIMED UP AND GO:  Was the test performed? No .  Phone visit.  Cognitive Function:     6CIT Screen 07/04/2021  What Year? 0 points  What month? 0 points  What time? 0 points  Count back from 20 0 points  Months in reverse 2 points  Repeat phrase 0 points  Total Score 2    Immunizations Immunization History  Administered Date(s) Administered   Influenza Whole 04/19/2008   Influenza, High Dose Seasonal PF 03/20/2019   Influenza, Seasonal, Injecte, Preservative Fre 03/28/2010, 03/15/2011, 03/18/2012   Influenza,inj,Quad PF,6+ Mos 04/09/2015, 03/13/2016, 03/28/2017   Influenza,inj,quad, With Preservative 08/19/2017   Moderna Sars-Covid-2 Vaccination 08/27/2019, 09/22/2019   Pneumococcal Conjugate-13 01/28/2021    TDAP status: Due, Education has been provided regarding the importance of this vaccine. Advised may receive this vaccine at local pharmacy or Health Dept. Aware to provide a copy of the vaccination record if obtained from local pharmacy or Health Dept. Verbalized acceptance and understanding.  Flu Vaccine status: Up to date  Pneumococcal vaccine status: Due, Education has been provided regarding the importance of this vaccine. Advised may receive this vaccine at local pharmacy or Health Dept. Aware to provide a copy of the vaccination record if obtained from local pharmacy or Health Dept. Verbalized acceptance and understanding.  Covid-19 vaccine status: Completed vaccines  Qualifies for Shingles Vaccine? Yes   Zostavax completed  No   Shingrix Completed?: No.    Education has been provided regarding the importance of this vaccine. Patient has been advised to call insurance company to determine out of pocket expense if they have not yet received this vaccine. Advised may also receive vaccine at local pharmacy or Health Dept. Verbalized acceptance and understanding.  Screening Tests Health Maintenance   Topic Date Due   TETANUS/TDAP  Never done   Zoster Vaccines- Shingrix (1 of 2) Never done   COLONOSCOPY (Pts 45-59yrs Insurance coverage will need to be confirmed)  06/20/2007   DEXA SCAN  Never done   COVID-19 Vaccine (3 - Moderna risk series) 10/20/2019   MAMMOGRAM  11/10/2019   INFLUENZA VACCINE  09/16/2021 (Originally 01/17/2021)   Pneumonia Vaccine 43+ Years old (2 - PPSV23 if available, else PCV20) 01/28/2022   Hepatitis C Screening  Completed   HPV VACCINES  Aged Out    Health Maintenance  Health Maintenance Due  Topic Date Due   TETANUS/TDAP  Never done   Zoster Vaccines- Shingrix (1 of 2) Never done   COLONOSCOPY (Pts 45-82yrs Insurance coverage will need to be confirmed)  06/20/2007   DEXA SCAN  Never done   COVID-19 Vaccine (3 - Moderna risk series) 10/20/2019   MAMMOGRAM  11/10/2019    Colorectal cancer screening: No longer required.  PT DECLINES COLONOSCOPY AND COLOGUARD. Mammogram status: Completed 11/09/2017. Repeat every year  Bone Density status: Ordered PT DECLINED. Pt provided with contact info and advised to call to schedule appt.  Lung Cancer Screening: (Low Dose CT Chest recommended if Age 30-80 years, 30 pack-year currently smoking OR have quit w/in 15years.) does not qualify.    Additional Screening:  Hepatitis C Screening: does qualify; Completed 09/03/2017  Vision Screening: Recommended annual ophthalmology exams for early detection of glaucoma and other disorders of the eye. Is the patient up to date with their annual eye exam?  Yes  Who is the provider or what is the name of the office in which the patient attends annual eye exams? [facility] If pt is not established with a provider, would they like to be referred to a provider to establish care? No .   Dental Screening: Recommended annual dental exams for proper oral hygiene  Community Resource Referral / Chronic Care Management: CRR required this visit?  No   CCM required this visit?   No      Plan:     I have personally reviewed and noted the following in the patients chart:   Medical and social history Use of alcohol, tobacco or illicit drugs  Current medications and supplements including opioid prescriptions. Patient is not currently taking opioid prescriptions. Functional ability and status Nutritional status Physical activity Advanced directives List of other physicians Hospitalizations, surgeries, and ER visits in previous 12 months Vitals Screenings to include cognitive, depression, and falls Referrals and appointments  In addition, I have reviewed and discussed with patient certain preventive protocols, quality metrics, and best practice recommendations. A written personalized care plan for preventive services as well as general preventive health recommendations were provided to patient.     Chriss Driver, LPN   [id number]   Nurse Notes: Pt declined cologuard, colonoscopy, repeat mammogram and bone density. Discussed shingrix and how to obtain.

## 2021-07-18 ENCOUNTER — Telehealth: Payer: Self-pay | Admitting: Family

## 2021-07-18 MED ORDER — OXYBUTYNIN CHLORIDE ER 10 MG PO TB24: 10.0000 mg | ORAL_TABLET | Freq: Every day | ORAL | 1 refills | Status: DC

## 2021-07-18 NOTE — Telephone Encounter (Signed)
Stop Detrol and start Oxybutynin.

## 2021-07-18 NOTE — Telephone Encounter (Signed)
Patient states the the Detrol LA cost way too much, but it works really good is there something else she can maybe try that would work as good? Please advise.

## 2021-07-18 NOTE — Telephone Encounter (Signed)
Patient said that she has a prescription that is too expensive and wants to talk to a nurse about it. Please call back and advise.

## 2021-07-18 NOTE — Telephone Encounter (Signed)
Patient aware and verbalized understanding. °

## 2021-08-13 ENCOUNTER — Other Ambulatory Visit: Payer: Self-pay | Admitting: Family

## 2021-08-13 DIAGNOSIS — M5442 Lumbago with sciatica, left side: Secondary | ICD-10-CM

## 2021-08-22 ENCOUNTER — Telehealth: Payer: Self-pay | Admitting: Family

## 2021-08-22 NOTE — Telephone Encounter (Signed)
Pt c/o being fatigued with Oxybutynin. ? ?Would like to start back on previous. Hard to understand pt. Will route to [place] to inform. ? ?Pt has enough refills of the previous medication. ?

## 2021-08-23 MED ORDER — TOLTERODINE TARTRATE ER 2 MG PO CP24: 2.0000 mg | ORAL_CAPSULE | Freq: Every day | ORAL | 1 refills | Status: DC

## 2021-08-23 MED ORDER — SOLIFENACIN SUCCINATE 10 MG PO TABS: 10.0000 mg | ORAL_TABLET | Freq: Every day | ORAL | 1 refills | Status: DC

## 2021-08-23 NOTE — Telephone Encounter (Signed)
LMTCB

## 2021-08-23 NOTE — Telephone Encounter (Signed)
Prescription sent to pharmacy.

## 2021-08-23 NOTE — Addendum Note (Signed)
Addended by: Evelina Dun A on: 08/23/2021 01:06 PM ? ? Modules accepted: Orders ? ?

## 2021-08-23 NOTE — Telephone Encounter (Signed)
I have changed to oxybutynin to [facility]. Prescription sent to pharmacy. Let me know if you have any problems.  ? ?Evelina Dun, FNP ? ?

## 2021-08-23 NOTE — Telephone Encounter (Signed)
Patient aware and states she would like to go back to tolperodine ER '2mg'$  ?

## 2021-08-23 NOTE — Telephone Encounter (Signed)
Rc for nurse 

## 2021-08-29 NOTE — Telephone Encounter (Signed)
Spoke with patient and advised her that a refill of the Detrol LA was sent in on 08/23/21, #90, 1 refill. ?

## 2021-08-29 NOTE — Telephone Encounter (Signed)
Pt wants to talk to nurse about rx tolterodine (DETROL LA) 2 MG 24 hr capsule. She says she wants to stay on this one. ?

## 2021-09-03 ENCOUNTER — Other Ambulatory Visit: Payer: Self-pay | Admitting: Cardiology

## 2021-09-05 ENCOUNTER — Other Ambulatory Visit: Payer: Self-pay

## 2021-09-13 ENCOUNTER — Other Ambulatory Visit: Payer: Self-pay | Admitting: Family

## 2021-09-15 ENCOUNTER — Encounter: Payer: Self-pay | Admitting: Family Medicine

## 2021-09-15 ENCOUNTER — Telehealth: Payer: Self-pay | Admitting: Family

## 2021-09-15 ENCOUNTER — Ambulatory Visit (INDEPENDENT_AMBULATORY_CARE_PROVIDER_SITE_OTHER): Payer: Medicare Other | Admitting: Family Medicine

## 2021-09-15 DIAGNOSIS — R059 Cough, unspecified: Secondary | ICD-10-CM | POA: Diagnosis not present

## 2021-09-15 MED ORDER — BENZONATATE 100 MG PO CAPS: 100.0000 mg | ORAL_CAPSULE | Freq: Three times a day (TID) | ORAL | 0 refills | Status: DC | PRN

## 2021-09-15 NOTE — Progress Notes (Signed)
? ?  Virtual Visit  Note ?Due to COVID-19 pandemic this visit was conducted virtually. This visit type was conducted due to national recommendations for restrictions regarding the COVID-19 Pandemic (e.g. social distancing, sheltering in place) in an effort to limit this patient's exposure and mitigate transmission in our community. All issues noted in this document were discussed and addressed.  A physical exam was not performed with this format. ? ?I connected with Sherry Bender on 09/15/21 at 1402 by telephone and verified that I am speaking with the correct person using two identifiers. Sherry Bender is currently located at home and no one is currently with her during the visit. The provider, Gwenlyn Perking, FNP is located in their office at time of visit. ? ?I discussed the limitations, risks, security and privacy concerns of performing an evaluation and management service by telephone and the availability of in person appointments. I also discussed with the patient that there may be a patient responsible charge related to this service. The patient expressed understanding and agreed to proceed. ? ?CC: cough ? ?History and Present Illness: ? ?HPI ?Sherry Bender reports a history of a cough. She reports that this is chronic for her. It has not worsened but she is now out of her tessalon perles that are helpful. She denies fever, shortness of breath, dizziness, myalgias, chest pains, or congestion.  ? ? ? ?ROS ?As per HPI.  ? ?Observations/Objective: ?Alert and oriented x 3. Able to speak in full sentences without difficulty.  ? ? ?Assessment and Plan: ?[name] was seen today for cough. ? ?Diagnoses and all orders for this visit: ? ?Cough in adult ?Denies other symptoms. Refill provided. Return to office for new or worsening symptoms, or if symptoms persist.  ?-     benzonatate (TESSALON PERLES) 100 MG capsule; Take 1 capsule (100 mg total) by mouth 3 (three) times daily as needed. ? ? ? ? ?Follow Up Instructions: ?As  needed.  ? ?  ?I discussed the assessment and treatment plan with the patient. The patient was provided an opportunity to ask questions and all were answered. The patient agreed with the plan and demonstrated an understanding of the instructions. ?  ?The patient was advised to call back or seek an in-person evaluation if the symptoms worsen or if the condition fails to improve as anticipated. ? ?The above assessment and management plan was discussed with the patient. The patient verbalized understanding of and has agreed to the management plan. Patient is aware to call the clinic if symptoms persist or worsen. Patient is aware when to return to the clinic for a follow-up visit. Patient educated on when it is appropriate to go to the emergency department.  ? ?Time call ended:  0214 ? ?I provided 12 minutes of  non face-to-face time during this encounter. ? ? ? ?Gwenlyn Perking, FNP ? ? ?

## 2021-09-26 ENCOUNTER — Telehealth: Payer: Self-pay | Admitting: Family

## 2021-09-26 ENCOUNTER — Encounter: Payer: Self-pay | Admitting: Family Medicine

## 2021-09-26 ENCOUNTER — Ambulatory Visit (INDEPENDENT_AMBULATORY_CARE_PROVIDER_SITE_OTHER): Payer: Medicare Other | Admitting: Family Medicine

## 2021-09-26 DIAGNOSIS — R059 Cough, unspecified: Secondary | ICD-10-CM

## 2021-09-26 MED ORDER — BENZONATATE 100 MG PO CAPS: 100.0000 mg | ORAL_CAPSULE | Freq: Three times a day (TID) | ORAL | 0 refills | Status: DC | PRN

## 2021-09-26 MED ORDER — LEVOCETIRIZINE DIHYDROCHLORIDE 5 MG PO TABS: 5.0000 mg | ORAL_TABLET | Freq: Every evening | ORAL | 2 refills | Status: DC

## 2021-09-26 NOTE — Progress Notes (Signed)
? ?  Virtual Visit  Note ?Due to COVID-19 pandemic this visit was conducted virtually. This visit type was conducted due to national recommendations for restrictions regarding the COVID-19 Pandemic (e.g. social distancing, sheltering in place) in an effort to limit this patient's exposure and mitigate transmission in our community. All issues noted in this document were discussed and addressed.  A physical exam was not performed with this format. ? ?I connected with Sherry Bender on 09/26/21 at 1245 by telephone and verified that I am speaking with the correct person using two identifiers. Sherry Bender is currently located at home and no one is currently with her during the visit. The provider, Gwenlyn Perking, FNP is located in their office at time of visit. ? ?I discussed the limitations, risks, security and privacy concerns of performing an evaluation and management service by telephone and the availability of in person appointments. I also discussed with the patient that there may be a patient responsible charge related to this service. The patient expressed understanding and agreed to proceed. ? ?CC: cough ? ?History and Present Illness: ? ?HPI ?Sherry Bender reports a cough for 4 days. It is mostly at night. She also reports congestion. She believes this may be due to her GERD. She denies fever, abdominal pain, vomiting, sore throat, shortness of breath, wheezing, heartburn, chest pain, or edema. She has been taking tessalon perles and mucinex for her symptoms with some improvement.  ? ? ? ?ROS ?As per HPI.  ? ?Observations/Objective: ?Alert and oriented x 3. Able to speak in full sentences without difficulty.  ? ? ?Assessment and Plan: ?Sherry Bender was seen today for cough. ? ?Diagnoses and all orders for this visit: ? ?Cough in adult ?Refill provided for tessalon perles. Add zyxal and continue mucinex. Should be evaluated in person if symptoms worsen or do not improve.  ?-     benzonatate (TESSALON PERLES) 100 MG  capsule; Take 1 capsule (100 mg total) by mouth 3 (three) times daily as needed. ?-     levocetirizine (XYZAL) 5 MG tablet; Take 1 tablet (5 mg total) by mouth every evening. ? ? ? ? ?Follow Up Instructions: ?Follow up in person if symptoms do not improve or worsen.  ? ?  ?I discussed the assessment and treatment plan with the patient. The patient was provided an opportunity to ask questions and all were answered. The patient agreed with the plan and demonstrated an understanding of the instructions. ?  ?The patient was advised to call back or seek an in-person evaluation if the symptoms worsen or if the condition fails to improve as anticipated. ? ?The above assessment and management plan was discussed with the patient. The patient verbalized understanding of and has agreed to the management plan. Patient is aware to call the clinic if symptoms persist or worsen. Patient is aware when to return to the clinic for a follow-up visit. Patient educated on when it is appropriate to go to the emergency department.  ? ?Time call ended:  1258 ? ?I provided 13 minutes of  non face-to-face time during this encounter. ? ? ? ?Gwenlyn Perking, FNP ? ? ?

## 2021-09-26 NOTE — Telephone Encounter (Signed)
Needs appt

## 2021-09-29 ENCOUNTER — Ambulatory Visit (INDEPENDENT_AMBULATORY_CARE_PROVIDER_SITE_OTHER): Payer: Medicare Other | Admitting: Family Medicine

## 2021-09-29 ENCOUNTER — Encounter: Payer: Self-pay | Admitting: Family Medicine

## 2021-09-29 DIAGNOSIS — L298 Other pruritus: Secondary | ICD-10-CM | POA: Diagnosis not present

## 2021-09-29 MED ORDER — NYSTATIN-TRIAMCINOLONE 100000-0.1 UNIT/GM-% EX OINT: 1.0000 "application " | TOPICAL_OINTMENT | Freq: Two times a day (BID) | CUTANEOUS | 0 refills | Status: DC

## 2021-09-29 NOTE — Progress Notes (Signed)
? ?Virtual Visit via telephone Note ?Due to COVID-19 pandemic this visit was conducted virtually. This visit type was conducted due to national recommendations for restrictions regarding the COVID-19 Pandemic (e.g. social distancing, sheltering in place) in an effort to limit this patient's exposure and mitigate transmission in our community. All issues noted in this document were discussed and addressed.  A physical exam was not performed with this format.  ? ?I connected with Sherry Bender on 09/29/2021 at 67 by telephone and verified that I am speaking with the correct person using two identifiers. Sherry Bender is currently located at home and patient is currently with them during visit. The provider, Monia Pouch, FNP is located in their office at time of visit. ? ?I discussed the limitations, risks, security and privacy concerns of performing an evaluation and management service by virtual visit and the availability of in person appointments. I also discussed with the patient that there may be a patient responsible charge related to this service. The patient expressed understanding and agreed to proceed. ? ?Subjective:  ?Patient ID: Sherry Bender, female    DOB: Mar 24, 1951, 71 y.o.   MRN: [id number] ? ?Chief Complaint:  Rash ? ? ?HPI: ?Sherry Bender is a 71 y.o. female presenting on 09/29/2021 for Rash ? ? ?Pt reports a erythematous flaking, and pruritic rash to her sacral area. States onset about 1-2 weeks ago. She has been using Vagasil on the rash with minimal relief. Denies drainage or bleeding from site. No fever, chills, weakness, or confusion.  ? ?Rash ?This is a new problem. The current episode started 1 to 4 weeks ago. Location: sacrum. The rash is characterized by itchiness, redness and scaling. Associated with: chemo/radiation therapy. Pertinent negatives include no anorexia, congestion, cough, diarrhea, eye pain, facial edema, fatigue, fever, joint pain, nail changes, rhinorrhea, shortness of  breath, sore throat or vomiting. Past treatments include anti-itch cream. The treatment provided mild relief.  ? ? ?Relevant past medical, surgical, family, and social history reviewed and updated as indicated.  ?Allergies and medications reviewed and updated. ? ? ?Past Medical History:  ?Diagnosis Date  ? Anxiety   ? Arthritis   ? Breast cancer ([place]) 2016  ? Cardiomyopathy ([place])   ? a. EF 25-30% by echocardiogram in 02/2019 b. 30-35% in 05/2019 c. normalized to 55-60% by echo in 02/2020  ? Coronary atherosclerosis   ? Cardiac CTA with negative FFR, nonobstructive March 2021  ? Cricopharyngeus muscle dysfunction   ? Gastric ulcer   ? GERD (gastroesophageal reflux disease)   ? Hiatal hernia   ? Hyperlipidemia   ? Hypertension   ? PONV (postoperative nausea and vomiting)   ? Scoliosis   ? Scoliosis   ? Skin cancer   ? Urinary frequency   ? ? ?Past Surgical History:  ?Procedure Laterality Date  ? ABDOMINAL HYSTERECTOMY  2000  ? TAH/BSO  ? ANTERIOR CERVICAL DECOMP/DISCECTOMY FUSION N/A 06/18/2014  ? Procedure: C4-5 C5-6 C6-7 ANTERIOR CERVICAL DECOMPRESSION/DISKECTOMY/FUSION;  Surgeon: Eustace Moore, MD;  Location: [facility];  Service: Neurosurgery;  Laterality: N/A;  C4-5 C5-6 C6-7 ANTERIOR CERVICAL DECOMPRESSION/DISKECTOMY/FUSION  ? ANTERIOR CERVICAL DECOMP/DISCECTOMY FUSION N/A 06/25/2014  ? Procedure: EXPLORATION OF CERVICAL FUSION WITH REVIOSN OF HARDWARE;  Surgeon: Eustace Moore, MD;  Location: [facility];  Service: Neurosurgery;  Laterality: N/A;  ? BIOPSY  01/01/2020  ? Procedure: BIOPSY;  Surgeon: Mauri Pole, MD;  Location: [facility];  Service: Endoscopy;;  ? CERVICAL FUSION    ? CHOLECYSTECTOMY  1994  ? COMPLETE MASTECTOMY W/ SENTINEL NODE BIOPSY Left 04/30/2015  ? ESOPHAGOGASTRODUODENOSCOPY (EGD) WITH PROPOFOL N/A 01/01/2020  ? Procedure: ESOPHAGOGASTRODUODENOSCOPY (EGD) WITH PROPOFOL;  Surgeon: Mauri Pole, MD;  Location: [facility];  Service: Endoscopy;  Laterality: N/A;  ? FOOT  NEUROMA SURGERY  01/2010  ? also had metatarasl shortened  ? KNEE ARTHROSCOPY  2005  ? right knee  ? MASTECTOMY Left 2016  ? PORT-A-CATH REMOVAL Right 04/30/2015  ? Procedure: REMOVAL PORT-A-CATH;  Surgeon: Fanny Skates, MD;  Location: [facility];  Service: General;  Laterality: Right;  ? PORTACATH PLACEMENT N/A 11/23/2014  ? Procedure: INSERTION PORT-A-CATH WITH ULTRASOUND;  Surgeon: Fanny Skates, MD;  Location: [facility];  Service: General;  Laterality: N/A;  ? REVERSE SHOULDER ARTHROPLASTY Left 07/15/2020  ? Procedure: REVERSE SHOULDER ARTHROPLASTY;  Surgeon: Mordecai Rasmussen, MD;  Location: [facility];  Service: Orthopedics;  Laterality: Left;  ? RHINOPLASTY    ? SIMPLE MASTECTOMY WITH AXILLARY SENTINEL NODE BIOPSY Left 04/30/2015  ? Procedure: LEFT TOTAL  MASTECTOMY WITH LEFT AXILLARY SENTINEL NODE BIOPSY;  Surgeon: Fanny Skates, MD;  Location: [facility];  Service: General;  Laterality: Left;  ? ? ?Social History  ? ?Socioeconomic History  ? Marital status: Married  ?  Spouse name: Gwyndolyn Saxon  ? Number of children: 2  ? Years of education: Not on file  ? Highest education level: Not on file  ?Occupational History  ? Not on file  ?Tobacco Use  ? Smoking status: Former  ?  Packs/day: 0.25  ?  Years: 42.00  ?  Pack years: 10.50  ?  Types: Cigarettes  ?  Quit date: 01/24/2017  ?  Years since quitting: 4.6  ? Smokeless tobacco: Never  ?Vaping Use  ? Vaping Use: Never used  ?Substance and Sexual Activity  ? Alcohol use: No  ? Drug use: No  ? Sexual activity: Never  ?Other Topics Concern  ? Not on file  ?Social History Narrative  ? Caffeine Use: 1 soda weekly  ? Regular exercise:  No  ? 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    ? Two daughters born 77 and 55- one in [place] one in [place].   ?   ?   ?   ? ?Social Determinants of Health  ? ?Financial Resource Strain: Low Risk   ? Difficulty of Paying Living Expenses: Not hard at all  ?Food Insecurity: No Food  Insecurity  ? Worried About Charity fundraiser in the Last Year: Never true  ? Ran Out of Food in the Last Year: Never true  ?Transportation Needs: No Transportation Needs  ? Lack of Transportation (Medical): No  ? Lack of Transportation (Non-Medical): No  ?Physical Activity: Insufficiently Active  ? Days of Exercise per Week: 2 days  ? Minutes of Exercise per Session: 20 min  ?Stress: No Stress Concern Present  ? Feeling of Stress : Not at all  ?Social Connections: Socially Integrated  ? Frequency of Communication with Friends and Family: More than three times a week  ? Frequency of Social Gatherings with Friends and Family: Never  ? Attends Religious Services: 1 to 4 times per year  ? Active Member of Clubs or Organizations: Yes  ? Attends Archivist Meetings: Never  ? Marital Status: Married  ?Intimate Partner Violence: Not At Risk  ? Fear of Current or Ex-Partner: No  ? Emotionally Abused: No  ? Physically Abused: No  ?  Sexually Abused: No  ? ? ?Outpatient Encounter Medications as of 09/29/2021  ?Medication Sig  ? nystatin-triamcinolone ointment (MYCOLOG) Apply 1 application. topically 2 (two) times daily.  ? albuterol (PROVENTIL) (2.5 MG/3ML) 0.083% nebulizer solution Take 3 mLs (2.5 mg total) by nebulization every 6 (six) hours as needed for wheezing or shortness of breath.  ? albuterol (VENTOLIN HFA) 108 (90 Base) MCG/ACT inhaler INHALE 2 PUFFS EVERY SIX (6) HOURS AS NEEDED FOR SHORTNESS OF BREATH.  ? aspirin EC 81 MG tablet Take 1 tablet (81 mg total) by mouth daily. Swallow whole.  ? atorvastatin (LIPITOR) 20 MG tablet TAKE 1 TABLET BY MOUTH EVERY DAY  ? benzonatate (TESSALON PERLES) 100 MG capsule Take 1 capsule (100 mg total) by mouth 3 (three) times daily as needed.  ? carvedilol (COREG) 3.125 MG tablet Take 0.5 tablets (1.55 mg total) by mouth in the morning and at bedtime.  ? diclofenac (VOLTAREN) 75 MG EC tablet Take 1 tablet (75 mg total) by mouth 2 (two) times daily.  ? diphenhydrAMINE  (BENADRYL) 25 MG tablet Take 25 mg by mouth at bedtime as needed for sleep.  ? ENTRESTO 24-26 MG TAKE 1 TABLET BY MOUTH 2 (TWO) TIMES DAILY. DUE FOR FOLLOW UP APPT  ? esomeprazole (NEXIUM) 40 MG capsule TAKE 1 CAPSULE BY M

## 2021-10-01 ENCOUNTER — Other Ambulatory Visit: Payer: Self-pay | Admitting: Cardiology

## 2021-10-11 ENCOUNTER — Telehealth: Payer: Self-pay | Admitting: Cardiology

## 2021-10-11 MED ORDER — ATORVASTATIN CALCIUM 20 MG PO TABS: ORAL_TABLET | ORAL | 1 refills | Status: DC

## 2021-10-11 NOTE — Telephone Encounter (Signed)
?*  STAT* If patient is at the pharmacy, call can be transferred to refill team. ? ? ?1. Which medications need to be refilled? (please list name of each medication and dose if known) atorvastatin (LIPITOR) 20 MG tablet ? ?2. Which pharmacy/location (including street and city if local pharmacy) is medication to be sent to? [id number] [place], [place] - [address]? ?3. Do they need a 30 day or 90 day supply? 90 ? ?

## 2021-10-11 NOTE — Telephone Encounter (Signed)
Completed.

## 2021-10-27 ENCOUNTER — Telehealth: Payer: Self-pay | Admitting: Hematology and Oncology

## 2021-10-27 NOTE — Telephone Encounter (Signed)
Rescheduled appointment per provider PAL. Left message. Patient will be mailed an updated calendar. 

## 2021-11-03 ENCOUNTER — Ambulatory Visit: Payer: 59 | Admitting: Hematology and Oncology

## 2021-11-12 ENCOUNTER — Other Ambulatory Visit: Payer: Self-pay | Admitting: Family

## 2021-11-12 ENCOUNTER — Other Ambulatory Visit: Payer: Self-pay | Admitting: Cardiology

## 2021-11-15 ENCOUNTER — Telehealth: Payer: Self-pay

## 2021-11-15 ENCOUNTER — Encounter: Payer: Self-pay | Admitting: *Deleted

## 2021-11-15 NOTE — Telephone Encounter (Signed)
Returned pt's call. Pt asks if Dr Lindi Adie can send her anything in for her "shakiness'. Asked pt to clarify and she states, "In the mornings I get real shaky-like and nervous and I wanted to know if Dr Lindi Adie would send me in something for it." Advised pt to call her PCP for appt regarding anxiety to see if they can prescribe something for her. Pt verbalized thanks and understanding.

## 2021-11-15 NOTE — Progress Notes (Signed)
Per pt request, RN placed updated calendar in the mail.  RN verified pt address on file.

## 2021-11-18 ENCOUNTER — Ambulatory Visit: Payer: 59 | Admitting: Hematology and Oncology

## 2021-12-06 ENCOUNTER — Ambulatory Visit: Payer: 59 | Admitting: Hematology and Oncology

## 2021-12-09 ENCOUNTER — Other Ambulatory Visit: Payer: Self-pay | Admitting: Cardiology

## 2021-12-09 ENCOUNTER — Other Ambulatory Visit: Payer: Self-pay | Admitting: Family

## 2021-12-14 ENCOUNTER — Other Ambulatory Visit: Payer: Self-pay | Admitting: Family

## 2021-12-15 ENCOUNTER — Other Ambulatory Visit: Payer: Self-pay | Admitting: Family

## 2021-12-15 NOTE — Telephone Encounter (Signed)
[place]. NTBS 30 days given 11/15/21

## 2021-12-16 ENCOUNTER — Ambulatory Visit (INDEPENDENT_AMBULATORY_CARE_PROVIDER_SITE_OTHER): Payer: Medicare Other | Admitting: Family

## 2021-12-16 ENCOUNTER — Telehealth: Payer: Self-pay | Admitting: Family

## 2021-12-16 ENCOUNTER — Encounter: Payer: Self-pay | Admitting: Family

## 2021-12-16 DIAGNOSIS — R051 Acute cough: Secondary | ICD-10-CM

## 2021-12-16 DIAGNOSIS — M159 Polyosteoarthritis, unspecified: Secondary | ICD-10-CM

## 2021-12-16 DIAGNOSIS — R059 Cough, unspecified: Secondary | ICD-10-CM

## 2021-12-16 DIAGNOSIS — K2100 Gastro-esophageal reflux disease with esophagitis, without bleeding: Secondary | ICD-10-CM | POA: Diagnosis not present

## 2021-12-16 DIAGNOSIS — G8929 Other chronic pain: Secondary | ICD-10-CM | POA: Diagnosis not present

## 2021-12-16 DIAGNOSIS — M5442 Lumbago with sciatica, left side: Secondary | ICD-10-CM

## 2021-12-16 MED ORDER — KETOROLAC TROMETHAMINE 10 MG PO TABS: 10.0000 mg | ORAL_TABLET | Freq: Four times a day (QID) | ORAL | 0 refills | Status: AC | PRN

## 2021-12-16 MED ORDER — BENZONATATE 100 MG PO CAPS: 100.0000 mg | ORAL_CAPSULE | Freq: Three times a day (TID) | ORAL | 1 refills | Status: AC | PRN

## 2021-12-16 MED ORDER — ESOMEPRAZOLE MAGNESIUM 40 MG PO CPDR: DELAYED_RELEASE_CAPSULE | ORAL | 1 refills | Status: DC

## 2021-12-16 NOTE — Telephone Encounter (Signed)
Name from pharmacy: KETOROLAC 10 MG TABLET        Will file in chart as: ketorolac (TORADOL) 10 MG tablet   Pharmacy comment: Alternative Requested:PRIOR AUTH REQUIRED.

## 2021-12-16 NOTE — Telephone Encounter (Signed)
Left message to make an appt and mailed letter

## 2021-12-16 NOTE — Progress Notes (Signed)
Virtual Visit  Note Due to COVID-19 pandemic this visit was conducted virtually. This visit type was conducted due to national recommendations for restrictions regarding the COVID-19 Pandemic (e.g. social distancing, sheltering in place) in an effort to limit this patient's exposure and mitigate transmission in our community. All issues noted in this document were discussed and addressed.  A physical exam was not performed with this format.  I connected with Sherry Bender on 12/16/21 at 11:33 AM by telephone and verified that I am speaking with the correct person using two identifiers. Sherry Bender is currently located at home and no one is currently with her during visit. The provider, Evelina Dun, FNP is located in their office at time of visit.  I discussed the limitations, risks, security and privacy concerns of performing an evaluation and management service by telephone and the availability of in person appointments. I also discussed with the patient that there may be a patient responsible charge related to this service. The patient expressed understanding and agreed to proceed.  Ms. Sherry, Bender are scheduled for a virtual visit with your provider today.    Just as we do with appointments in the office, we must obtain your consent to participate.  Your consent will be active for this visit and any virtual visit you may have with one of our providers in the next 365 days.    If you have a MyChart account, I can also send a copy of this consent to you electronically.  All virtual visits are billed to your insurance company just like a traditional visit in the office.  As this is a virtual visit, video technology does not allow for your provider to perform a traditional examination.  This may limit your provider's ability to fully assess your condition.  If your provider identifies any concerns that need to be evaluated in person or the need to arrange testing such as labs, EKG, etc, we will make  arrangements to do so.    Although advances in technology are sophisticated, we cannot ensure that it will always work on either your end or our end.  If the connection with a video visit is poor, we may have to switch to a telephone visit.  With either a video or telephone visit, we are not always able to ensure that we have a secure connection.   I need to obtain your verbal consent now.   Are you willing to proceed with your visit today?   Sherry Bender has provided verbal consent on 12/16/2021 for a virtual visit (video or telephone).   Evelina Dun, [place] 12/16/2021  11:41 AM   History and Present Illness:  Gastroesophageal Reflux She complains of belching, coughing and heartburn. This is a chronic problem. The current episode started more than 1 year ago. The problem occurs occasionally. She has tried a PPI for the symptoms. The treatment provided moderate relief.  Back Pain This is a chronic problem. The current episode started more than 1 year ago. The problem occurs intermittently. The problem has been waxing and waning since onset. The pain is present in the lumbar spine. The quality of the pain is described as aching. The pain is at a severity of 10/10. The pain is moderate.  Arthritis Presents for follow-up visit. She complains of pain and stiffness. The symptoms have been worsening. Affected locations include the right knee, left knee, left hip, right hip, left MCP and right MCP. Her pain is at a severity of 10/10.  Cough  This is a recurrent problem. The current episode started more than 1 month ago. The problem has been waxing and waning. The problem occurs every few minutes. The cough is Non-productive. Associated symptoms include heartburn. She has tried rest and OTC cough suppressant for the symptoms. The treatment provided mild relief.      Review of Systems  Respiratory:  Positive for cough.   Gastrointestinal:  Positive for heartburn.  Musculoskeletal:  Positive for  arthritis, back pain and stiffness.     Observations/Objective: No SOB or distress noted   Assessment and Plan: 1. Acute left-sided low back pain with left-sided sciatica - ketorolac (TORADOL) 10 MG tablet; Take 1 tablet (10 mg total) by mouth every 6 (six) hours as needed.  Dispense: 20 tablet; Refill: 0  2. Primary osteoarthritis involving multiple joints - ketorolac (TORADOL) 10 MG tablet; Take 1 tablet (10 mg total) by mouth every 6 (six) hours as needed.  Dispense: 20 tablet; Refill: 0  3. Chronic bilateral low back pain with left-sided sciatica Rest ROM exercises  Toradol as needed  No other NSAID's - ketorolac (TORADOL) 10 MG tablet; Take 1 tablet (10 mg total) by mouth every 6 (six) hours as needed.  Dispense: 20 tablet; Refill: 0  4. Acute cough  5. Cough in adult - benzonatate (TESSALON PERLES) 100 MG capsule; Take 1 capsule (100 mg total) by mouth 3 (three) times daily as needed.  Dispense: 90 capsule; Refill: 1  6. Gastroesophageal reflux disease with esophagitis without hemorrhage -Diet discussed- Avoid fried, spicy, citrus foods, caffeine and alcohol -Do not eat 2-3 hours before bedtime -Encouraged small frequent meals -Avoid NSAID's - esomeprazole (NEXIUM) 40 MG capsule; TAKE 1 CAPSULE BY MOUTH EVERY DAY  Dispense: 90 capsule; Refill: 1     I discussed the assessment and treatment plan with the patient. The patient was provided an opportunity to ask questions and all were answered. The patient agreed with the plan and demonstrated an understanding of the instructions.   The patient was advised to call back or seek an in-person evaluation if the symptoms worsen or if the condition fails to improve as anticipated.  The above assessment and management plan was discussed with the patient. The patient verbalized understanding of and has agreed to the management plan. Patient is aware to call the clinic if symptoms persist or worsen. Patient is aware when to return to  the clinic for a follow-up visit. Patient educated on when it is appropriate to go to the emergency department.   Time call ended:  11:48 AM   I provided 15 minutes of  non face-to-face time during this encounter.    Evelina Dun, FNP

## 2021-12-22 ENCOUNTER — Other Ambulatory Visit: Payer: Self-pay | Admitting: Family Medicine

## 2021-12-22 DIAGNOSIS — R059 Cough, unspecified: Secondary | ICD-10-CM

## 2022-01-04 ENCOUNTER — Other Ambulatory Visit: Payer: Self-pay | Admitting: Cardiology

## 2022-01-09 NOTE — Progress Notes (Signed)
Patient Care Team: Sharion Balloon, FNP as PCP - General (Family Medicine) Satira Sark, MD as PCP - Cardiology (Cardiology) Dr Geanie Berlin (Gynecology) Holley Bouche, NP (Inactive) as Nurse Practitioner (Nurse Practitioner) Nicholas Lose, MD as Consulting Physician (Hematology and Oncology) Fanny Skates, MD as Consulting Physician (General Surgery) Kyung Rudd, MD as Consulting Physician (Radiation Oncology) Sylvan Cheese, NP as Nurse Practitioner (Hematology and Oncology)  DIAGNOSIS: No diagnosis found.  SUMMARY OF ONCOLOGIC HISTORY: Oncology History  Breast cancer of upper-outer quadrant of left female breast ([place])  11/09/2014 Mammogram   Left breast irregular hypoechoic mass 12:00 position 5.3 x 3.7 x 2.9 cm, left low axilla normal sized lymph nodes   11/09/2014 Initial Diagnosis   Left Bx: IDC with DCIS; Grade 3, ER - (0%), PR- (0%), Her 2 Neg, Ki 67: 70%   11/26/2014 Breast MRI   3.5 x 4.5 x 3.8 cm biopsy-proven malignancy within the upper/ upper inner left breast. Scattered foci adjacent to the biopsy-proven malignancy and within the upper left breast are indeterminate.   11/26/2014 Clinical Stage   Stage IIB: T3 N0   11/30/2014 -  Neo-Adjuvant Chemotherapy   Dose dense Adriamycin and Cytoxan 4 followed by Abraxane weekly 12   04/05/2015 Breast MRI   Breast complete imaging response to neoadjuvant chemotherapy   04/30/2015 Surgery   Left mastectomy: IDC 2 mm residual disease, 1 lymph node positive for isolated tumor cells within normal lymphatics. 0/3 Lymph Nodes   04/30/2015 Pathologic Stage   Stage IA: pT1a pN0   06/28/2015 - 08/12/2015 Radiation Therapy   Adjuvant radiation therapy: Left chest wall and supraclavicular fossa 50.4 Gy over 28 fractions; left chest wall boost 10 Gy over 5 fractions. Total dose 60.4 Gy   12/02/2015 Survivorship   Survivorship care plan provided to patient     CHIEF COMPLIANT:   INTERVAL HISTORY: Sherry Bender is a  71 y.o. female with above-mentioned history of iron deficiency anemia previously treated with IV iron. 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 She presents to the clinic today for a follow-up.    ALLERGIES:  is allergic to hydrocodone, codeine, gabapentin, and spironolactone.  MEDICATIONS:  Current Outpatient Medications  Medication Sig Dispense Refill   albuterol (PROVENTIL) (2.5 MG/3ML) 0.083% nebulizer solution Take 3 mLs (2.5 mg total) by nebulization every 6 (six) hours as needed for wheezing or shortness of breath. 150 mL 1   albuterol (VENTOLIN HFA) 108 (90 Base) MCG/ACT inhaler INHALE 2 PUFFS EVERY SIX (6) HOURS AS NEEDED FOR SHORTNESS OF BREATH. 18 each 3   aspirin EC 81 MG tablet Take 1 tablet (81 mg total) by mouth daily. Swallow whole. 90 tablet 3   atorvastatin (LIPITOR) 20 MG tablet TAKE 1 TABLET BY MOUTH EVERY DAY. 90 tablet 1   benzonatate (TESSALON PERLES) 100 MG capsule Take 1 capsule (100 mg total) by mouth 3 (three) times daily as needed. 90 capsule 1   carvedilol (COREG) 3.125 MG tablet TAKE 1/2 TABLET (1.55 MG TOTAL) BY MOUTH IN THE MORNING AND AT BEDTIME.-NEED APPOINTMENT 30 tablet 1   diphenhydrAMINE (BENADRYL) 25 MG tablet Take 25 mg by mouth at bedtime as needed for sleep.     ENTRESTO 24-26 MG TAKE 1 TABLET BY MOUTH 2 (TWO) TIMES DAILY. DUE FOR FOLLOW UP APPT 180 tablet 3   esomeprazole (NEXIUM) 40 MG capsule TAKE 1 CAPSULE BY MOUTH EVERY DAY 90 capsule 1   fluticasone-salmeterol (ADVAIR) 250-50 MCG/ACT AEPB Inhale 1  puff into the lungs in the morning and at bedtime. 60 each 2   ketorolac (TORADOL) 10 MG tablet Take 1 tablet (10 mg total) by mouth every 6 (six) hours as needed. 20 tablet 0   levocetirizine (XYZAL) 5 MG tablet TAKE 1 TABLET BY MOUTH EVERY DAY IN THE EVENING 90 tablet 1   lidocaine (LIDODERM) 5 % Place onto the skin.     Propylene Glycol (SYSTANE BALANCE) 0.6 % SOLN Place 1 drop into both eyes daily as needed (dry eyes).      tolterodine (DETROL LA) 2 MG 24 hr capsule Take 1 capsule (2 mg total) by mouth daily. 90 capsule 1   No current facility-administered medications for this visit.    PHYSICAL EXAMINATION: ECOG PERFORMANCE STATUS: {CHL ONC ECOG [phone]}  There were no vitals filed for this visit. There were no vitals filed for this visit.  BREAST:*** No palpable masses or nodules in either right or left breasts. No palpable axillary supraclavicular or infraclavicular adenopathy no breast tenderness or nipple discharge. (exam performed in the presence of a chaperone)  LABORATORY DATA:  I have reviewed the data as listed    Latest Ref Rng & Units 04/01/2021    8:15 PM 03/08/2021    1:03 PM 01/28/2021    4:47 PM  CMP  Glucose 70 - 99 mg/dL 81  92  86   BUN 8 - 23 mg/dL '9  11  15   '$ Creatinine 0.44 - 1.00 mg/dL <0.30  <0.30  0.32   Sodium 135 - 145 mmol/L 130  130  133   Potassium 3.5 - 5.1 mmol/L 3.6  4.0  5.1   Chloride 98 - 111 mmol/L 91  89  90   CO2 22 - 32 mmol/L '31  29  27   '$ Calcium 8.9 - 10.3 mg/dL 8.9  9.6  9.5   Total Protein 6.5 - 8.1 g/dL 6.8  7.2  6.7   Total Bilirubin 0.3 - 1.2 mg/dL 0.4  0.8  0.3   Alkaline Phos 38 - 126 U/L 83  58  59   AST 15 - 41 U/L '17  28  28   '$ ALT 0 - 44 U/L '12  24  21     '$ Lab Results  Component Value Date   WBC 4.7 04/01/2021   HGB 10.9 (L) 04/01/2021   HCT 34.0 (L) 04/01/2021   MCV 87.0 04/01/2021   PLT 215 04/01/2021   NEUTROABS 3.4 04/01/2021    ASSESSMENT & PLAN:  No problem-specific Assessment & Plan notes found for this encounter.    No orders of the defined types were placed in this encounter.  The patient has a good understanding of the overall plan. she agrees with it. she will call with any problems that may develop before the next visit here. Total time spent: 30 mins including face to face time and time spent for planning, charting and co-ordination of care   Suzzette Righter, [place] 01/09/22    I Gardiner Coins am scribing  for Dr. Lindi Adie  ***

## 2022-01-10 ENCOUNTER — Inpatient Hospital Stay: Payer: Medicare Other | Attending: Hematology and Oncology | Admitting: Hematology and Oncology

## 2022-01-10 ENCOUNTER — Other Ambulatory Visit: Payer: Self-pay

## 2022-01-10 DIAGNOSIS — C50412 Malignant neoplasm of upper-outer quadrant of left female breast: Secondary | ICD-10-CM

## 2022-01-10 DIAGNOSIS — Z171 Estrogen receptor negative status [ER-]: Secondary | ICD-10-CM

## 2022-01-10 DIAGNOSIS — Z923 Personal history of irradiation: Secondary | ICD-10-CM | POA: Insufficient documentation

## 2022-01-10 DIAGNOSIS — Z9221 Personal history of antineoplastic chemotherapy: Secondary | ICD-10-CM | POA: Insufficient documentation

## 2022-01-10 DIAGNOSIS — Z853 Personal history of malignant neoplasm of breast: Secondary | ICD-10-CM | POA: Insufficient documentation

## 2022-01-10 NOTE — Assessment & Plan Note (Addendum)
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 0/3 Lymph Nodes, T1aN0 stage IA Adjuvant radiation therapy 06/28/2015 to 08/09/2015   Breast cancer surveillance: Breast exam 10/26/2017: No palpable lumps or nodules of concern; Mammogram5/24/2019: benign. Because of her lack of mobility and her severe scoliosis and her inability to stand, she cannot tolerate mammograms. -------------------------------------------------------------------------------------------------------------------------------------------------------- Iron deficiency anemia I instructed the patient that she needs upper endoscopy and colonoscopy. IV iron given 12/02/2015 2 doses Response to IV iron therapy:Improvement in hemoglobin from 10.6to13.8 Labs 09/03/2017: Hemoglobin 13.6 -------------------------------------------------------------------------------------------------------------------------------------------------------- Hypertension:Under her PCP care.  CHF: Stable Left shoulder replacement (fall and fracture jan 2021)  Return to clinic in 1 year follow-up.

## 2022-01-10 NOTE — Progress Notes (Unsigned)
Cardiology Office Note    Date:  01/11/2022   ID:  Sherry Bender, DOB 1950-06-25, MRN [id number]  PCP:  Sharion Balloon, FNP  Cardiologist: Rozann Lesches, MD    Chief Complaint  Patient presents with   Follow-up    Overdue Visit    History of Present Illness:    Sherry Bender is a 71 y.o. female with past medical history of [place] (EF 25 to 30% by echo in 02/2019, at 30-35% by repeat echo in 05/2019, normalized by imaging in 02/2020), CAD (s/p coronary CT in 08/2019 showing moderate CAD but not significant by FFR), history of breast cancer (s/p mastectomy and radiation) and HLD who presents to the office today for overdue follow-up.  She was examined by myself in 09/2020 and had recently suffered a mechanical fall and underwent surgical repair of her left humerus. Her husband was helping with ADL's around the house and she denied any recent anginal symptoms. She was continued on current medical therapy with Coreg 1.55 mg twice daily and Entresto 24-26 mg twice daily along with Atorvastatin 20 mg daily. It was recommended that she restart ASA 81 mg daily.   In talking with the patient and her husband today, she reports having progressive weakness along her lower extremities and walks around her home without a cane but rarely leaves the house. She reports difficulty getting in and out of the car and her husband sometimes has to lift her. She reports her breathing has overall been stable and denies any recent orthopnea, PND or pitting edema. No recent chest pain or palpitations. She does report feeling "jittery" in the mornings and is unaware of any specific triggers. We did discuss her possibly checking her blood pressure and blood sugar at that time if able. She also reports anxiety which could be contributing.  Past Medical History:  Diagnosis Date   Anxiety    Arthritis    Breast cancer ([place]) 2016   Cardiomyopathy ([place])    a. EF 25-30% by echocardiogram in 02/2019 b. 30-35% in  05/2019 c. normalized to 55-60% by echo in 02/2020   Coronary atherosclerosis    Cardiac CTA with negative FFR, nonobstructive March 2021   Cricopharyngeus muscle dysfunction    Gastric ulcer    GERD (gastroesophageal reflux disease)    Hiatal hernia    Hyperlipidemia    Hypertension    PONV (postoperative nausea and vomiting)    Scoliosis    Scoliosis    Skin cancer    Urinary frequency     Past Surgical History:  Procedure Laterality Date   ABDOMINAL HYSTERECTOMY  2000   TAH/BSO   ANTERIOR CERVICAL DECOMP/DISCECTOMY FUSION N/A 06/18/2014   Procedure: C4-5 C5-6 C6-7 ANTERIOR CERVICAL DECOMPRESSION/DISKECTOMY/FUSION;  Surgeon: Eustace Moore, MD;  Location: [facility];  Service: Neurosurgery;  Laterality: N/A;  C4-5 C5-6 C6-7 ANTERIOR CERVICAL DECOMPRESSION/DISKECTOMY/FUSION   ANTERIOR CERVICAL DECOMP/DISCECTOMY FUSION N/A 06/25/2014   Procedure: EXPLORATION OF CERVICAL FUSION WITH REVIOSN OF HARDWARE;  Surgeon: Eustace Moore, MD;  Location: [facility];  Service: Neurosurgery;  Laterality: N/A;   BIOPSY  01/01/2020   Procedure: BIOPSY;  Surgeon: Mauri Pole, MD;  Location: [facility];  Service: Endoscopy;;   CERVICAL FUSION     CHOLECYSTECTOMY  1994   COMPLETE MASTECTOMY W/ SENTINEL NODE BIOPSY Left 04/30/2015   ESOPHAGOGASTRODUODENOSCOPY (EGD) WITH PROPOFOL N/A 01/01/2020   Procedure: ESOPHAGOGASTRODUODENOSCOPY (EGD) WITH PROPOFOL;  Surgeon: Mauri Pole, MD;  Location: [facility];  Service: Endoscopy;  Laterality: N/A;   FOOT NEUROMA SURGERY  01/2010   also had metatarasl shortened   KNEE ARTHROSCOPY  2005   right knee   MASTECTOMY Left 2016   PORT-A-CATH REMOVAL Right 04/30/2015   Procedure: REMOVAL PORT-A-CATH;  Surgeon: Fanny Skates, MD;  Location: [facility];  Service: General;  Laterality: Right;   PORTACATH PLACEMENT N/A 11/23/2014   Procedure: INSERTION PORT-A-CATH WITH ULTRASOUND;  Surgeon: Fanny Skates, MD;  Location: [facility];   Service: General;  Laterality: N/A;   REVERSE SHOULDER ARTHROPLASTY Left 07/15/2020   Procedure: REVERSE SHOULDER ARTHROPLASTY;  Surgeon: Mordecai Rasmussen, MD;  Location: [facility];  Service: Orthopedics;  Laterality: Left;   RHINOPLASTY     SIMPLE MASTECTOMY WITH AXILLARY SENTINEL NODE BIOPSY Left 04/30/2015   Procedure: LEFT TOTAL  MASTECTOMY WITH LEFT AXILLARY SENTINEL NODE BIOPSY;  Surgeon: Fanny Skates, MD;  Location: [facility];  Service: General;  Laterality: Left;    Current Medications: Outpatient Medications Prior to Visit  Medication Sig Dispense Refill   albuterol (PROVENTIL) (2.5 MG/3ML) 0.083% nebulizer solution Take 3 mLs (2.5 mg total) by nebulization every 6 (six) hours as needed for wheezing or shortness of breath. 150 mL 1   albuterol (VENTOLIN HFA) 108 (90 Base) MCG/ACT inhaler INHALE 2 PUFFS EVERY SIX (6) HOURS AS NEEDED FOR SHORTNESS OF BREATH. 18 each 3   aspirin EC 81 MG tablet Take 1 tablet (81 mg total) by mouth daily. Swallow whole. 90 tablet 3   atorvastatin (LIPITOR) 20 MG tablet TAKE 1 TABLET BY MOUTH EVERY DAY. 90 tablet 1   benzonatate (TESSALON PERLES) 100 MG capsule Take 1 capsule (100 mg total) by mouth 3 (three) times daily as needed. 90 capsule 1   diphenhydrAMINE (BENADRYL) 25 MG tablet Take 25 mg by mouth at bedtime as needed for sleep.     fluticasone-salmeterol (ADVAIR) 250-50 MCG/ACT AEPB Inhale 1 puff into the lungs in the morning and at bedtime. 60 each 2   ketorolac (TORADOL) 10 MG tablet Take 1 tablet (10 mg total) by mouth every 6 (six) hours as needed. 20 tablet 0   levocetirizine (XYZAL) 5 MG tablet TAKE 1 TABLET BY MOUTH EVERY DAY IN THE EVENING 90 tablet 1   lidocaine (LIDODERM) 5 % Place onto the skin.     Propylene Glycol (SYSTANE BALANCE) 0.6 % SOLN Place 1 drop into both eyes daily as needed (dry eyes).     tolterodine (DETROL LA) 2 MG 24 hr capsule Take 1 capsule (2 mg total) by mouth daily. 90 capsule 1   carvedilol (COREG) 3.125 MG tablet TAKE  1/2 TABLET (1.55 MG TOTAL) BY MOUTH IN THE MORNING AND AT BEDTIME.-NEED APPOINTMENT 30 tablet 1   ENTRESTO 24-26 MG TAKE 1 TABLET BY MOUTH 2 (TWO) TIMES DAILY. DUE FOR FOLLOW UP APPT 180 tablet 3   esomeprazole (NEXIUM) 40 MG capsule TAKE 1 CAPSULE BY MOUTH EVERY DAY 90 capsule 1   No facility-administered medications prior to visit.     Allergies:   Hydrocodone, Codeine, Gabapentin, and Spironolactone   Social History   Socioeconomic History   Marital status: Married    Spouse name: Gwyndolyn Saxon   Number of children: 2   Years of education: Not on file   Highest education level: Not on file  Occupational History   Not on file  Tobacco Use   Smoking status: Former    Packs/day: 0.25    Years: 42.00    Total pack years: 10.50  Types: Cigarettes    Quit date: 01/24/2017    Years since quitting: 4.9   Smokeless tobacco: Never  Vaping Use   Vaping Use: Never used  Substance and Sexual Activity   Alcohol use: No   Drug use: No   Sexual activity: Never  Other Topics Concern   Not on file  Social History Narrative   Caffeine Use: 1 soda weekly   Regular exercise:  No   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     Two daughters born 28 and 66- one in [place] one in [place].             Social Determinants of Health   Financial Resource Strain: Low Risk  (07/04/2021)   Overall Financial Resource Strain (CARDIA)    Difficulty of Paying Living Expenses: Not hard at all  Food Insecurity: No Food Insecurity (07/04/2021)   Hunger Vital Sign    Worried About Running Out of Food in the Last Year: Never true    Ran Out of Food in the Last Year: Never true  Transportation Needs: No Transportation Needs (07/04/2021)   PRAPARE - Hydrologist (Medical): No    Lack of Transportation (Non-Medical): No  Physical Activity: Insufficiently Active (07/04/2021)   Exercise Vital Sign    Days of Exercise per Week: 2  days    Minutes of Exercise per Session: 20 min  Stress: No Stress Concern Present (07/04/2021)   [place]    Feeling of Stress : Not at all  Social Connections: [place] (07/04/2021)   Social Connection and Isolation Panel [NHANES]    Frequency of Communication with Friends and Family: More than three times a week    Frequency of Social Gatherings with Friends and Family: Never    Attends Religious Services: 1 to 4 times per year    Active Member of [employer] or Organizations: Yes    Attends Archivist Meetings: Never    Marital Status: Married     Family History:  The patient's family history includes Cancer in her maternal grandmother and paternal aunt; Diabetes in her mother; Heart disease in her father and mother; Hypertension in her father and mother.   Review of Systems:    Please see the history of present illness.     All other systems reviewed and are otherwise negative except as noted above.   Physical Exam:    VS:  BP 120/84   Pulse 89   Ht '5\' 4"'$  (1.626 m)   Wt 89 lb (40.4 kg)   LMP 02/28/1999   SpO2 92%   BMI 15.28 kg/m    General: Pleasant, frail female appearing in no acute distress. Head: Normocephalic, atraumatic. Neck: No carotid bruits. JVD not elevated.  Lungs: Respirations regular and unlabored, without wheezes or rales.  Heart: Regular rate and rhythm. No S3 or S4.  No murmur, no rubs, or gallops appreciated. Abdomen: Appears non-distended. No obvious abdominal masses. Msk:  Strength and tone appear normal for age. No obvious joint deformities or effusions. Extremities: No clubbing or cyanosis. No pitting edema. Distal pedal pulses are 2+ bilaterally. Neuro: Alert and oriented X 3. Moves all extremities spontaneously. No focal deficits noted. Psych:  Responds to questions appropriately with a normal affect. Skin: No rashes or lesions noted  Wt Readings from Last 3  Encounters:  01/11/22 89 lb (40.4 kg)  07/04/21 88 lb (39.9 kg)  04/01/21 88 lb (39.9 kg)     Studies/Labs Reviewed:   EKG:  EKG is not ordered today.   Recent Labs: 04/01/2021: ALT 12; B Natriuretic Peptide 58.5; BUN 9; Creatinine, Ser <0.30; Hemoglobin 10.9; Platelets 215; Potassium 3.6; Sodium 130   Lipid Panel    Component Value Date/Time   CHOL 138 09/12/2019 1518   CHOL 200 (H) 12/26/2018 1525   TRIG 58 09/12/2019 1518   HDL 68 09/12/2019 1518   HDL 93 12/26/2018 1525   CHOLHDL 2.0 09/12/2019 1518   VLDL 12 09/12/2019 1518   LDLCALC 58 09/12/2019 1518   LDLCALC 90 12/26/2018 1525    Additional studies/ records that were reviewed today include:    Coronary CT: 08/2019 IMPRESSION: 1. Moderate CAD in proximal LAD with positive remodeling, and mid circumflex, CADRADS = 3. CT FFR will be performed and reported separately.   2. Coronary artery calcium score is 905, which places the patient in the 97 percentile.   3. Normal coronary origin with right dominance.  1. Left Main:  No significant stenosis. FFR = 0.92   2. LAD: No significant stenosis. Proximal FFR = 0.91, Mid FFR = 0.89, Distal FFR = 0.84 3. LCX: No significant stenosis. Proximal FFR = 0.95, Distal FFR = 0.85, OM = 0.91 4. RCA: No significant stenosis. Proximal FFR = 0.91, Mid FFR = 0.89, Distal FFR = 0.85   IMPRESSION: 1.  CT FFR analysis didn't show any significant stenosis.   RECOMMENDATIONS: Goal directed medical therapy for secondary prevention of CAD. Aggressive risk factor modification.  Echocardiogram: 11/2020 IMPRESSIONS     1. Left ventricular ejection fraction, by estimation, is 60 to 65%. The  left ventricle has normal function. The left ventricle has no regional  wall motion abnormalities. Left ventricular diastolic parameters are  consistent with Grade I diastolic  dysfunction (impaired relaxation).   2. Right ventricular systolic function is normal. The right ventricular   size is normal. There is mildly elevated pulmonary artery systolic  pressure. The estimated right ventricular systolic pressure is 62.9 mmHg.   3. The mitral valve is normal in structure. No evidence of mitral valve  regurgitation. No evidence of mitral stenosis.   4. The aortic valve is normal in structure. Aortic valve regurgitation is  not visualized. No aortic stenosis is present.   5. The inferior vena cava is dilated in size with >50% respiratory  variability, suggesting right atrial pressure of 8 mmHg.    Assessment:    1. Nonischemic cardiomyopathy ([place])   2. Coronary artery disease involving native coronary artery of native heart without angina pectoris   3. Hyperlipidemia LDL goal <70   4. Medication management   5. Gastroesophageal reflux disease with esophagitis without hemorrhage      Plan:   In order of problems listed above:  1. HFimpEF - Her EF was previously at 25-30%, normalized by repeat imaging and at 60-65% in 11/2020. She appears euvolemic on examination today and denies any recent orthopnea, PND or pitting edema. - Continue Coreg 1.'55mg'$  BID and Entresto 24-'26mg'$  BID. Will recheck a CBC and CMET with upcoming labs. Would not add an SGLT2 inhibitor given her malnutrition (BMI 15.28).   2. CAD - Prior Coronary CT in 08/2019 showed moderate CAD but not significant by FFR. She has baseline dyspnea on exertion in the setting of deconditioning but denies any progression of this or associated chest pain. - Continue ASA 81 mg daily, Atorvastatin 20  mg daily and Coreg 1.55 mg twice daily.  3. HLD - Followed by her PCP. No recent FLP on file. Most recent in 2021 showed LDL at 58. Remains on Atorvastatin 20 mg daily. Will recheck an FLP and LFT's with upcoming labs.   4. GERD - She is almost out of Nexium and is unable to travel to see her PCP within the next few weeks. Will prescribe Nexium 40 mg daily to her pharmacy.    Medication Adjustments/Labs and Tests  Ordered: Current medicines are reviewed at length with the patient today.  Concerns regarding medicines are outlined above.  Medication changes, Labs and Tests ordered today are listed in the Patient Instructions below. Patient Instructions  Medication Instructions:  Your physician recommends that you continue on your current medications as directed. Please refer to the Current Medication list given to you today.   Labwork: Fasting Lipid Panel CBC CMET  Testing/Procedures: None  Follow-Up: Follow up with Dr. Domenic Polite in 6 months.   Any Other Special Instructions Will Be Listed Below (If Applicable).     If you need a refill on your cardiac medications before your next appointment, please call your pharmacy.    Signed, Erma Heritage, PA-C  01/11/2022 4:39 PM    [facility] [address] [place], [place] [address] Phone: [phone] Fax: [phone]

## 2022-01-11 ENCOUNTER — Ambulatory Visit: Payer: Medicare Other | Admitting: Student

## 2022-01-11 ENCOUNTER — Encounter: Payer: Self-pay | Admitting: Student

## 2022-01-11 VITALS — BP 120/84 | HR 89 | Ht 64.0 in | Wt 89.0 lb

## 2022-01-11 DIAGNOSIS — K2100 Gastro-esophageal reflux disease with esophagitis, without bleeding: Secondary | ICD-10-CM | POA: Diagnosis not present

## 2022-01-11 DIAGNOSIS — I428 Other cardiomyopathies: Secondary | ICD-10-CM

## 2022-01-11 DIAGNOSIS — I2510 Atherosclerotic heart disease of native coronary artery without angina pectoris: Secondary | ICD-10-CM | POA: Diagnosis not present

## 2022-01-11 DIAGNOSIS — E785 Hyperlipidemia, unspecified: Secondary | ICD-10-CM | POA: Diagnosis not present

## 2022-01-11 DIAGNOSIS — Z79899 Other long term (current) drug therapy: Secondary | ICD-10-CM

## 2022-01-11 MED ORDER — CARVEDILOL 3.125 MG PO TABS: 1.5500 mg | ORAL_TABLET | Freq: Two times a day (BID) | ORAL | 3 refills | Status: AC

## 2022-01-11 MED ORDER — ESOMEPRAZOLE MAGNESIUM 40 MG PO CPDR: DELAYED_RELEASE_CAPSULE | ORAL | 2 refills | Status: AC

## 2022-01-11 MED ORDER — ENTRESTO 24-26 MG PO TABS: ORAL_TABLET | ORAL | 3 refills | Status: DC

## 2022-01-11 NOTE — Patient Instructions (Signed)
Medication Instructions:  Your physician recommends that you continue on your current medications as directed. Please refer to the Current Medication list given to you today.   Labwork: Fasting Lipid Panel CBC CMET  Testing/Procedures: None  Follow-Up: Follow up with Dr. Domenic Polite in 6 months.   Any Other Special Instructions Will Be Listed Below (If Applicable).     If you need a refill on your cardiac medications before your next appointment, please call your pharmacy.

## 2022-01-18 ENCOUNTER — Other Ambulatory Visit: Payer: Self-pay | Admitting: *Deleted

## 2022-01-18 MED ORDER — ENTRESTO 24-26 MG PO TABS: ORAL_TABLET | ORAL | 3 refills | Status: AC

## 2022-02-08 ENCOUNTER — Other Ambulatory Visit: Payer: Self-pay | Admitting: Family

## 2022-03-30 DIAGNOSIS — R571 Hypovolemic shock: Secondary | ICD-10-CM | POA: Diagnosis not present

## 2022-03-30 DIAGNOSIS — E785 Hyperlipidemia, unspecified: Secondary | ICD-10-CM | POA: Diagnosis not present

## 2022-03-30 DIAGNOSIS — J9601 Acute respiratory failure with hypoxia: Secondary | ICD-10-CM | POA: Diagnosis not present

## 2022-03-30 DIAGNOSIS — R638 Other symptoms and signs concerning food and fluid intake: Secondary | ICD-10-CM | POA: Diagnosis not present

## 2022-03-30 DIAGNOSIS — R0602 Shortness of breath: Secondary | ICD-10-CM | POA: Diagnosis not present

## 2022-03-30 DIAGNOSIS — E7800 Pure hypercholesterolemia, unspecified: Secondary | ICD-10-CM | POA: Diagnosis not present

## 2022-03-30 DIAGNOSIS — I110 Hypertensive heart disease with heart failure: Secondary | ICD-10-CM | POA: Diagnosis not present

## 2022-03-30 DIAGNOSIS — I5030 Unspecified diastolic (congestive) heart failure: Secondary | ICD-10-CM | POA: Diagnosis not present

## 2022-03-30 DIAGNOSIS — R5381 Other malaise: Secondary | ICD-10-CM | POA: Diagnosis not present

## 2022-03-30 DIAGNOSIS — Z931 Gastrostomy status: Secondary | ICD-10-CM | POA: Diagnosis not present

## 2022-03-30 DIAGNOSIS — Z9989 Dependence on other enabling machines and devices: Secondary | ICD-10-CM | POA: Diagnosis not present

## 2022-03-30 DIAGNOSIS — R1310 Dysphagia, unspecified: Secondary | ICD-10-CM | POA: Diagnosis not present

## 2022-03-30 DIAGNOSIS — Z8711 Personal history of peptic ulcer disease: Secondary | ICD-10-CM | POA: Diagnosis not present

## 2022-03-30 DIAGNOSIS — Z853 Personal history of malignant neoplasm of breast: Secondary | ICD-10-CM | POA: Diagnosis not present

## 2022-03-30 DIAGNOSIS — K219 Gastro-esophageal reflux disease without esophagitis: Secondary | ICD-10-CM | POA: Diagnosis not present

## 2022-03-30 DIAGNOSIS — E43 Unspecified severe protein-calorie malnutrition: Secondary | ICD-10-CM | POA: Diagnosis not present

## 2022-03-30 DIAGNOSIS — Z681 Body mass index (BMI) 19 or less, adult: Secondary | ICD-10-CM | POA: Diagnosis not present

## 2022-03-30 DIAGNOSIS — R0600 Dyspnea, unspecified: Secondary | ICD-10-CM | POA: Diagnosis not present

## 2022-03-30 DIAGNOSIS — E88A Wasting disease (syndrome) due to underlying condition: Secondary | ICD-10-CM | POA: Diagnosis not present

## 2022-03-30 DIAGNOSIS — J449 Chronic obstructive pulmonary disease, unspecified: Secondary | ICD-10-CM | POA: Diagnosis not present

## 2022-03-30 DIAGNOSIS — J690 Pneumonitis due to inhalation of food and vomit: Secondary | ICD-10-CM | POA: Diagnosis not present

## 2022-03-30 DIAGNOSIS — E871 Hypo-osmolality and hyponatremia: Secondary | ICD-10-CM | POA: Diagnosis not present

## 2022-03-30 DIAGNOSIS — Z9911 Dependence on respirator [ventilator] status: Secondary | ICD-10-CM | POA: Diagnosis not present

## 2022-03-30 DIAGNOSIS — R54 Age-related physical debility: Secondary | ICD-10-CM | POA: Diagnosis not present

## 2022-03-30 DIAGNOSIS — Z79899 Other long term (current) drug therapy: Secondary | ICD-10-CM | POA: Diagnosis not present

## 2022-03-30 DIAGNOSIS — G9341 Metabolic encephalopathy: Secondary | ICD-10-CM | POA: Diagnosis not present

## 2022-03-30 DIAGNOSIS — M549 Dorsalgia, unspecified: Secondary | ICD-10-CM | POA: Diagnosis not present

## 2022-03-30 DIAGNOSIS — Z20822 Contact with and (suspected) exposure to covid-19: Secondary | ICD-10-CM | POA: Diagnosis not present

## 2022-03-30 DIAGNOSIS — K259 Gastric ulcer, unspecified as acute or chronic, without hemorrhage or perforation: Secondary | ICD-10-CM | POA: Diagnosis not present

## 2022-03-30 DIAGNOSIS — I10 Essential (primary) hypertension: Secondary | ICD-10-CM | POA: Diagnosis not present

## 2022-03-30 DIAGNOSIS — Z792 Long term (current) use of antibiotics: Secondary | ICD-10-CM | POA: Diagnosis not present

## 2022-03-30 DIAGNOSIS — Z4682 Encounter for fitting and adjustment of non-vascular catheter: Secondary | ICD-10-CM | POA: Diagnosis not present

## 2022-03-30 DIAGNOSIS — J9690 Respiratory failure, unspecified, unspecified whether with hypoxia or hypercapnia: Secondary | ICD-10-CM | POA: Diagnosis not present

## 2022-03-30 DIAGNOSIS — R1314 Dysphagia, pharyngoesophageal phase: Secondary | ICD-10-CM | POA: Diagnosis not present

## 2022-03-30 DIAGNOSIS — Z515 Encounter for palliative care: Secondary | ICD-10-CM | POA: Diagnosis not present

## 2022-03-30 DIAGNOSIS — R410 Disorientation, unspecified: Secondary | ICD-10-CM | POA: Diagnosis not present

## 2022-03-30 DIAGNOSIS — I5032 Chronic diastolic (congestive) heart failure: Secondary | ICD-10-CM | POA: Diagnosis not present

## 2022-03-30 DIAGNOSIS — J90 Pleural effusion, not elsewhere classified: Secondary | ICD-10-CM | POA: Diagnosis not present

## 2022-03-30 DIAGNOSIS — Z1152 Encounter for screening for COVID-19: Secondary | ICD-10-CM | POA: Diagnosis not present

## 2022-03-30 DIAGNOSIS — Z9012 Acquired absence of left breast and nipple: Secondary | ICD-10-CM | POA: Diagnosis not present

## 2022-03-30 DIAGNOSIS — J9602 Acute respiratory failure with hypercapnia: Secondary | ICD-10-CM | POA: Diagnosis not present

## 2022-03-30 DIAGNOSIS — E878 Other disorders of electrolyte and fluid balance, not elsewhere classified: Secondary | ICD-10-CM | POA: Diagnosis not present

## 2022-03-30 DIAGNOSIS — Z923 Personal history of irradiation: Secondary | ICD-10-CM | POA: Diagnosis not present

## 2022-03-30 DIAGNOSIS — Z452 Encounter for adjustment and management of vascular access device: Secondary | ICD-10-CM | POA: Diagnosis not present

## 2022-03-30 DIAGNOSIS — I5022 Chronic systolic (congestive) heart failure: Secondary | ICD-10-CM | POA: Diagnosis not present

## 2022-03-30 DIAGNOSIS — R531 Weakness: Secondary | ICD-10-CM | POA: Diagnosis not present

## 2022-03-30 DIAGNOSIS — Z66 Do not resuscitate: Secondary | ICD-10-CM | POA: Diagnosis not present

## 2022-03-30 DIAGNOSIS — E876 Hypokalemia: Secondary | ICD-10-CM | POA: Diagnosis not present

## 2022-03-30 DIAGNOSIS — J984 Other disorders of lung: Secondary | ICD-10-CM | POA: Diagnosis not present

## 2022-03-30 DIAGNOSIS — R0902 Hypoxemia: Secondary | ICD-10-CM | POA: Diagnosis not present

## 2022-03-30 DIAGNOSIS — Z9981 Dependence on supplemental oxygen: Secondary | ICD-10-CM | POA: Diagnosis not present

## 2022-03-30 DIAGNOSIS — R918 Other nonspecific abnormal finding of lung field: Secondary | ICD-10-CM | POA: Diagnosis not present

## 2022-03-30 DIAGNOSIS — R52 Pain, unspecified: Secondary | ICD-10-CM | POA: Diagnosis not present

## 2022-03-30 DIAGNOSIS — R1312 Dysphagia, oropharyngeal phase: Secondary | ICD-10-CM | POA: Diagnosis not present

## 2022-03-30 DIAGNOSIS — C50919 Malignant neoplasm of unspecified site of unspecified female breast: Secondary | ICD-10-CM | POA: Diagnosis not present

## 2022-03-30 DIAGNOSIS — Z7901 Long term (current) use of anticoagulants: Secondary | ICD-10-CM | POA: Diagnosis not present

## 2022-04-01 DIAGNOSIS — J9690 Respiratory failure, unspecified, unspecified whether with hypoxia or hypercapnia: Secondary | ICD-10-CM | POA: Diagnosis not present

## 2022-04-03 DIAGNOSIS — Z853 Personal history of malignant neoplasm of breast: Secondary | ICD-10-CM | POA: Diagnosis not present

## 2022-04-03 DIAGNOSIS — Z9989 Dependence on other enabling machines and devices: Secondary | ICD-10-CM | POA: Diagnosis not present

## 2022-04-03 DIAGNOSIS — Z79899 Other long term (current) drug therapy: Secondary | ICD-10-CM | POA: Diagnosis not present

## 2022-04-03 DIAGNOSIS — I5030 Unspecified diastolic (congestive) heart failure: Secondary | ICD-10-CM | POA: Diagnosis not present

## 2022-04-03 DIAGNOSIS — E876 Hypokalemia: Secondary | ICD-10-CM | POA: Diagnosis not present

## 2022-04-03 DIAGNOSIS — E43 Unspecified severe protein-calorie malnutrition: Secondary | ICD-10-CM | POA: Diagnosis not present

## 2022-04-03 DIAGNOSIS — Z8711 Personal history of peptic ulcer disease: Secondary | ICD-10-CM | POA: Diagnosis not present

## 2022-04-03 DIAGNOSIS — J9602 Acute respiratory failure with hypercapnia: Secondary | ICD-10-CM | POA: Diagnosis not present

## 2022-04-03 DIAGNOSIS — J9601 Acute respiratory failure with hypoxia: Secondary | ICD-10-CM | POA: Diagnosis not present

## 2022-04-03 DIAGNOSIS — R54 Age-related physical debility: Secondary | ICD-10-CM | POA: Diagnosis not present

## 2022-04-04 DIAGNOSIS — E43 Unspecified severe protein-calorie malnutrition: Secondary | ICD-10-CM | POA: Diagnosis not present

## 2022-04-04 DIAGNOSIS — Z8711 Personal history of peptic ulcer disease: Secondary | ICD-10-CM | POA: Diagnosis not present

## 2022-04-04 DIAGNOSIS — R1312 Dysphagia, oropharyngeal phase: Secondary | ICD-10-CM | POA: Diagnosis not present

## 2022-04-04 DIAGNOSIS — I5030 Unspecified diastolic (congestive) heart failure: Secondary | ICD-10-CM | POA: Diagnosis not present

## 2022-04-04 DIAGNOSIS — R531 Weakness: Secondary | ICD-10-CM | POA: Diagnosis not present

## 2022-04-04 DIAGNOSIS — J9602 Acute respiratory failure with hypercapnia: Secondary | ICD-10-CM | POA: Diagnosis not present

## 2022-04-04 DIAGNOSIS — Z853 Personal history of malignant neoplasm of breast: Secondary | ICD-10-CM | POA: Diagnosis not present

## 2022-04-04 DIAGNOSIS — J9601 Acute respiratory failure with hypoxia: Secondary | ICD-10-CM | POA: Diagnosis not present

## 2022-04-04 DIAGNOSIS — Z79899 Other long term (current) drug therapy: Secondary | ICD-10-CM | POA: Diagnosis not present

## 2022-04-05 DIAGNOSIS — Z853 Personal history of malignant neoplasm of breast: Secondary | ICD-10-CM | POA: Diagnosis not present

## 2022-04-05 DIAGNOSIS — I5030 Unspecified diastolic (congestive) heart failure: Secondary | ICD-10-CM | POA: Diagnosis not present

## 2022-04-05 DIAGNOSIS — E876 Hypokalemia: Secondary | ICD-10-CM | POA: Diagnosis not present

## 2022-04-05 DIAGNOSIS — J449 Chronic obstructive pulmonary disease, unspecified: Secondary | ICD-10-CM | POA: Diagnosis not present

## 2022-04-05 DIAGNOSIS — R54 Age-related physical debility: Secondary | ICD-10-CM | POA: Diagnosis not present

## 2022-04-05 DIAGNOSIS — J690 Pneumonitis due to inhalation of food and vomit: Secondary | ICD-10-CM | POA: Diagnosis not present

## 2022-04-05 DIAGNOSIS — J9602 Acute respiratory failure with hypercapnia: Secondary | ICD-10-CM | POA: Diagnosis not present

## 2022-04-05 DIAGNOSIS — E43 Unspecified severe protein-calorie malnutrition: Secondary | ICD-10-CM | POA: Diagnosis not present

## 2022-04-05 DIAGNOSIS — Z79899 Other long term (current) drug therapy: Secondary | ICD-10-CM | POA: Diagnosis not present

## 2022-04-05 DIAGNOSIS — R1310 Dysphagia, unspecified: Secondary | ICD-10-CM | POA: Diagnosis not present

## 2022-04-05 DIAGNOSIS — Z8711 Personal history of peptic ulcer disease: Secondary | ICD-10-CM | POA: Diagnosis not present

## 2022-04-05 DIAGNOSIS — Z9012 Acquired absence of left breast and nipple: Secondary | ICD-10-CM | POA: Diagnosis not present

## 2022-04-05 DIAGNOSIS — Z9981 Dependence on supplemental oxygen: Secondary | ICD-10-CM | POA: Diagnosis not present

## 2022-04-05 DIAGNOSIS — J9601 Acute respiratory failure with hypoxia: Secondary | ICD-10-CM | POA: Diagnosis not present

## 2022-04-06 DIAGNOSIS — R918 Other nonspecific abnormal finding of lung field: Secondary | ICD-10-CM | POA: Diagnosis not present

## 2022-04-06 DIAGNOSIS — R1312 Dysphagia, oropharyngeal phase: Secondary | ICD-10-CM | POA: Diagnosis not present

## 2022-04-06 DIAGNOSIS — Z853 Personal history of malignant neoplasm of breast: Secondary | ICD-10-CM | POA: Diagnosis not present

## 2022-04-06 DIAGNOSIS — J9601 Acute respiratory failure with hypoxia: Secondary | ICD-10-CM | POA: Diagnosis not present

## 2022-04-06 DIAGNOSIS — J690 Pneumonitis due to inhalation of food and vomit: Secondary | ICD-10-CM | POA: Diagnosis not present

## 2022-04-06 DIAGNOSIS — E876 Hypokalemia: Secondary | ICD-10-CM | POA: Diagnosis not present

## 2022-04-06 DIAGNOSIS — Z9981 Dependence on supplemental oxygen: Secondary | ICD-10-CM | POA: Diagnosis not present

## 2022-04-06 DIAGNOSIS — J9602 Acute respiratory failure with hypercapnia: Secondary | ICD-10-CM | POA: Diagnosis not present

## 2022-04-06 DIAGNOSIS — I5030 Unspecified diastolic (congestive) heart failure: Secondary | ICD-10-CM | POA: Diagnosis not present

## 2022-04-06 DIAGNOSIS — E43 Unspecified severe protein-calorie malnutrition: Secondary | ICD-10-CM | POA: Diagnosis not present

## 2022-04-06 DIAGNOSIS — Z8711 Personal history of peptic ulcer disease: Secondary | ICD-10-CM | POA: Diagnosis not present

## 2022-04-06 DIAGNOSIS — Z79899 Other long term (current) drug therapy: Secondary | ICD-10-CM | POA: Diagnosis not present

## 2022-04-06 DIAGNOSIS — R54 Age-related physical debility: Secondary | ICD-10-CM | POA: Diagnosis not present

## 2022-04-06 DIAGNOSIS — Z452 Encounter for adjustment and management of vascular access device: Secondary | ICD-10-CM | POA: Diagnosis not present

## 2022-04-06 DIAGNOSIS — Z4682 Encounter for fitting and adjustment of non-vascular catheter: Secondary | ICD-10-CM | POA: Diagnosis not present

## 2022-04-07 DIAGNOSIS — J9601 Acute respiratory failure with hypoxia: Secondary | ICD-10-CM | POA: Diagnosis not present

## 2022-04-07 DIAGNOSIS — Z9911 Dependence on respirator [ventilator] status: Secondary | ICD-10-CM | POA: Diagnosis not present

## 2022-04-07 DIAGNOSIS — J690 Pneumonitis due to inhalation of food and vomit: Secondary | ICD-10-CM | POA: Diagnosis not present

## 2022-04-07 DIAGNOSIS — R1312 Dysphagia, oropharyngeal phase: Secondary | ICD-10-CM | POA: Diagnosis not present

## 2022-04-07 DIAGNOSIS — R638 Other symptoms and signs concerning food and fluid intake: Secondary | ICD-10-CM | POA: Diagnosis not present

## 2022-04-07 DIAGNOSIS — Z79899 Other long term (current) drug therapy: Secondary | ICD-10-CM | POA: Diagnosis not present

## 2022-04-07 DIAGNOSIS — E43 Unspecified severe protein-calorie malnutrition: Secondary | ICD-10-CM | POA: Diagnosis not present

## 2022-04-07 DIAGNOSIS — R54 Age-related physical debility: Secondary | ICD-10-CM | POA: Diagnosis not present

## 2022-04-07 DIAGNOSIS — I5030 Unspecified diastolic (congestive) heart failure: Secondary | ICD-10-CM | POA: Diagnosis not present

## 2022-04-07 DIAGNOSIS — Z9012 Acquired absence of left breast and nipple: Secondary | ICD-10-CM | POA: Diagnosis not present

## 2022-04-07 DIAGNOSIS — Z853 Personal history of malignant neoplasm of breast: Secondary | ICD-10-CM | POA: Diagnosis not present

## 2022-04-07 DIAGNOSIS — E876 Hypokalemia: Secondary | ICD-10-CM | POA: Diagnosis not present

## 2022-04-07 DIAGNOSIS — J9602 Acute respiratory failure with hypercapnia: Secondary | ICD-10-CM | POA: Diagnosis not present

## 2022-04-08 DIAGNOSIS — J9690 Respiratory failure, unspecified, unspecified whether with hypoxia or hypercapnia: Secondary | ICD-10-CM | POA: Diagnosis not present

## 2022-04-08 DIAGNOSIS — J90 Pleural effusion, not elsewhere classified: Secondary | ICD-10-CM | POA: Diagnosis not present

## 2022-04-08 DIAGNOSIS — Z9012 Acquired absence of left breast and nipple: Secondary | ICD-10-CM | POA: Diagnosis not present

## 2022-04-08 DIAGNOSIS — R54 Age-related physical debility: Secondary | ICD-10-CM | POA: Diagnosis not present

## 2022-04-08 DIAGNOSIS — J9602 Acute respiratory failure with hypercapnia: Secondary | ICD-10-CM | POA: Diagnosis not present

## 2022-04-08 DIAGNOSIS — Z9911 Dependence on respirator [ventilator] status: Secondary | ICD-10-CM | POA: Diagnosis not present

## 2022-04-08 DIAGNOSIS — J9601 Acute respiratory failure with hypoxia: Secondary | ICD-10-CM | POA: Diagnosis not present

## 2022-04-08 DIAGNOSIS — Z79899 Other long term (current) drug therapy: Secondary | ICD-10-CM | POA: Diagnosis not present

## 2022-04-08 DIAGNOSIS — Z8711 Personal history of peptic ulcer disease: Secondary | ICD-10-CM | POA: Diagnosis not present

## 2022-04-08 DIAGNOSIS — E43 Unspecified severe protein-calorie malnutrition: Secondary | ICD-10-CM | POA: Diagnosis not present

## 2022-04-08 DIAGNOSIS — R1312 Dysphagia, oropharyngeal phase: Secondary | ICD-10-CM | POA: Diagnosis not present

## 2022-04-08 DIAGNOSIS — Z853 Personal history of malignant neoplasm of breast: Secondary | ICD-10-CM | POA: Diagnosis not present

## 2022-04-08 DIAGNOSIS — Z931 Gastrostomy status: Secondary | ICD-10-CM | POA: Diagnosis not present

## 2022-04-08 DIAGNOSIS — R638 Other symptoms and signs concerning food and fluid intake: Secondary | ICD-10-CM | POA: Diagnosis not present

## 2022-04-08 DIAGNOSIS — I5030 Unspecified diastolic (congestive) heart failure: Secondary | ICD-10-CM | POA: Diagnosis not present

## 2022-04-09 DIAGNOSIS — J90 Pleural effusion, not elsewhere classified: Secondary | ICD-10-CM | POA: Diagnosis not present

## 2022-04-09 DIAGNOSIS — R0602 Shortness of breath: Secondary | ICD-10-CM | POA: Diagnosis not present

## 2022-04-11 DIAGNOSIS — R0602 Shortness of breath: Secondary | ICD-10-CM | POA: Diagnosis not present

## 2022-04-11 DIAGNOSIS — J90 Pleural effusion, not elsewhere classified: Secondary | ICD-10-CM | POA: Diagnosis not present

## 2022-04-12 DIAGNOSIS — J690 Pneumonitis due to inhalation of food and vomit: Secondary | ICD-10-CM | POA: Diagnosis not present

## 2022-04-12 DIAGNOSIS — I110 Hypertensive heart disease with heart failure: Secondary | ICD-10-CM | POA: Diagnosis not present

## 2022-04-12 DIAGNOSIS — E43 Unspecified severe protein-calorie malnutrition: Secondary | ICD-10-CM | POA: Diagnosis not present

## 2022-04-12 DIAGNOSIS — Z79899 Other long term (current) drug therapy: Secondary | ICD-10-CM | POA: Diagnosis not present

## 2022-04-12 DIAGNOSIS — R410 Disorientation, unspecified: Secondary | ICD-10-CM | POA: Diagnosis not present

## 2022-04-12 DIAGNOSIS — J9601 Acute respiratory failure with hypoxia: Secondary | ICD-10-CM | POA: Diagnosis not present

## 2022-04-12 DIAGNOSIS — K219 Gastro-esophageal reflux disease without esophagitis: Secondary | ICD-10-CM | POA: Diagnosis not present

## 2022-04-12 DIAGNOSIS — E876 Hypokalemia: Secondary | ICD-10-CM | POA: Diagnosis not present

## 2022-04-12 DIAGNOSIS — I5032 Chronic diastolic (congestive) heart failure: Secondary | ICD-10-CM | POA: Diagnosis not present

## 2022-04-12 DIAGNOSIS — Z792 Long term (current) use of antibiotics: Secondary | ICD-10-CM | POA: Diagnosis not present

## 2022-04-12 DIAGNOSIS — Z9989 Dependence on other enabling machines and devices: Secondary | ICD-10-CM | POA: Diagnosis not present

## 2022-04-12 DIAGNOSIS — J9602 Acute respiratory failure with hypercapnia: Secondary | ICD-10-CM | POA: Diagnosis not present

## 2022-04-13 ENCOUNTER — Other Ambulatory Visit: Payer: Self-pay | Admitting: Cardiology

## 2022-04-13 DIAGNOSIS — J9602 Acute respiratory failure with hypercapnia: Secondary | ICD-10-CM | POA: Diagnosis not present

## 2022-04-13 DIAGNOSIS — R410 Disorientation, unspecified: Secondary | ICD-10-CM | POA: Diagnosis not present

## 2022-04-13 DIAGNOSIS — Z79899 Other long term (current) drug therapy: Secondary | ICD-10-CM | POA: Diagnosis not present

## 2022-04-13 DIAGNOSIS — E876 Hypokalemia: Secondary | ICD-10-CM | POA: Diagnosis not present

## 2022-04-13 DIAGNOSIS — E43 Unspecified severe protein-calorie malnutrition: Secondary | ICD-10-CM | POA: Diagnosis not present

## 2022-04-13 DIAGNOSIS — I10 Essential (primary) hypertension: Secondary | ICD-10-CM | POA: Diagnosis not present

## 2022-04-13 DIAGNOSIS — J9601 Acute respiratory failure with hypoxia: Secondary | ICD-10-CM | POA: Diagnosis not present

## 2022-04-13 DIAGNOSIS — J690 Pneumonitis due to inhalation of food and vomit: Secondary | ICD-10-CM | POA: Diagnosis not present

## 2022-04-13 DIAGNOSIS — I5032 Chronic diastolic (congestive) heart failure: Secondary | ICD-10-CM | POA: Diagnosis not present

## 2022-04-13 DIAGNOSIS — Z9981 Dependence on supplemental oxygen: Secondary | ICD-10-CM | POA: Diagnosis not present

## 2022-04-13 DIAGNOSIS — K219 Gastro-esophageal reflux disease without esophagitis: Secondary | ICD-10-CM | POA: Diagnosis not present

## 2022-04-13 DIAGNOSIS — Z792 Long term (current) use of antibiotics: Secondary | ICD-10-CM | POA: Diagnosis not present

## 2022-04-14 DIAGNOSIS — K259 Gastric ulcer, unspecified as acute or chronic, without hemorrhage or perforation: Secondary | ICD-10-CM | POA: Diagnosis not present

## 2022-04-14 DIAGNOSIS — Z9012 Acquired absence of left breast and nipple: Secondary | ICD-10-CM | POA: Diagnosis not present

## 2022-04-14 DIAGNOSIS — R54 Age-related physical debility: Secondary | ICD-10-CM | POA: Diagnosis not present

## 2022-04-14 DIAGNOSIS — E43 Unspecified severe protein-calorie malnutrition: Secondary | ICD-10-CM | POA: Diagnosis not present

## 2022-04-14 DIAGNOSIS — Z79899 Other long term (current) drug therapy: Secondary | ICD-10-CM | POA: Diagnosis not present

## 2022-04-14 DIAGNOSIS — I5030 Unspecified diastolic (congestive) heart failure: Secondary | ICD-10-CM | POA: Diagnosis not present

## 2022-04-14 DIAGNOSIS — J9601 Acute respiratory failure with hypoxia: Secondary | ICD-10-CM | POA: Diagnosis not present

## 2022-04-14 DIAGNOSIS — Z853 Personal history of malignant neoplasm of breast: Secondary | ICD-10-CM | POA: Diagnosis not present

## 2022-04-14 DIAGNOSIS — Z9981 Dependence on supplemental oxygen: Secondary | ICD-10-CM | POA: Diagnosis not present

## 2022-04-14 DIAGNOSIS — J9602 Acute respiratory failure with hypercapnia: Secondary | ICD-10-CM | POA: Diagnosis not present

## 2022-04-19 DEATH — deceased

## 2022-07-20 ENCOUNTER — Telehealth: Payer: Medicare Other | Admitting: Cardiology

## 2023-01-11 ENCOUNTER — Ambulatory Visit: Payer: Medicare Other | Admitting: Hematology and Oncology
